# Patient Record
Sex: Female | Born: 1960 | Hispanic: Yes | Marital: Married | Smoking: Never smoker
Health system: Southern US, Community
[De-identification: ages and names within clinical notes are randomized; demographics above are authoritative.]

## PROBLEM LIST (undated history)

## (undated) DIAGNOSIS — E785 Hyperlipidemia, unspecified: Secondary | ICD-10-CM

## (undated) DIAGNOSIS — E559 Vitamin D deficiency, unspecified: Secondary | ICD-10-CM

## (undated) DIAGNOSIS — I10 Essential (primary) hypertension: Secondary | ICD-10-CM

## (undated) DIAGNOSIS — K219 Gastro-esophageal reflux disease without esophagitis: Secondary | ICD-10-CM

## (undated) HISTORY — PX: BREAST BIOPSY: SHX20

## (undated) HISTORY — PX: CHOLECYSTECTOMY: SHX55

---

## 2003-12-11 ENCOUNTER — Encounter: Admission: RE | Admit: 2003-12-11 | Discharge: 2003-12-11 | Payer: Self-pay | Admitting: Sports Medicine

## 2004-01-14 ENCOUNTER — Encounter: Payer: Self-pay | Admitting: Family Medicine

## 2004-01-14 ENCOUNTER — Encounter: Admission: RE | Admit: 2004-01-14 | Discharge: 2004-01-14 | Payer: Self-pay | Admitting: Sports Medicine

## 2009-03-13 ENCOUNTER — Ambulatory Visit: Payer: Self-pay | Admitting: Family Medicine

## 2009-03-13 ENCOUNTER — Encounter: Payer: Self-pay | Admitting: Family Medicine

## 2009-03-13 DIAGNOSIS — N912 Amenorrhea, unspecified: Secondary | ICD-10-CM

## 2009-03-18 ENCOUNTER — Ambulatory Visit: Payer: Self-pay | Admitting: Family Medicine

## 2009-03-18 ENCOUNTER — Encounter: Payer: Self-pay | Admitting: Family Medicine

## 2009-03-18 LAB — CONVERTED CEMR LAB
HDL: 44 mg/dL (ref >39)
LDL Cholesterol: 128 mg/dL — ABNORMAL HIGH (ref 0–99)
Total CHOL/HDL Ratio: 4.3
Triglycerides: 84 mg/dL (ref <150)
VLDL: 17 mg/dL (ref 0–40)

## 2009-03-21 ENCOUNTER — Encounter: Payer: Self-pay | Admitting: Family Medicine

## 2013-06-20 ENCOUNTER — Other Ambulatory Visit (HOSPITAL_COMMUNITY)
Admission: RE | Admit: 2013-06-20 | Discharge: 2013-06-20 | Disposition: A | Discharge status: Home / Self Care | Payer: Commercial Indemnity | Source: Ambulatory Visit | Attending: Obstetrics & Gynecology | Admitting: Obstetrics & Gynecology

## 2013-06-20 ENCOUNTER — Other Ambulatory Visit: Payer: Self-pay

## 2013-06-20 ENCOUNTER — Other Ambulatory Visit: Payer: Self-pay | Admitting: Obstetrics & Gynecology

## 2013-06-20 DIAGNOSIS — Z01419 Encounter for gynecological examination (general) (routine) without abnormal findings: Secondary | ICD-10-CM | POA: Insufficient documentation

## 2013-06-20 DIAGNOSIS — Z1231 Encounter for screening mammogram for malignant neoplasm of breast: Secondary | ICD-10-CM

## 2013-06-20 DIAGNOSIS — Z1151 Encounter for screening for human papillomavirus (HPV): Secondary | ICD-10-CM | POA: Insufficient documentation

## 2013-07-25 ENCOUNTER — Inpatient Hospital Stay: Admission: RE | Admit: 2013-07-25 | Payer: Self-pay | Source: Ambulatory Visit

## 2013-07-30 ENCOUNTER — Ambulatory Visit
Admission: RE | Admit: 2013-07-30 | Discharge: 2013-07-30 | Disposition: A | Discharge status: Home / Self Care | Payer: Managed Care, Other (non HMO) | Source: Ambulatory Visit

## 2013-07-30 DIAGNOSIS — Z1231 Encounter for screening mammogram for malignant neoplasm of breast: Secondary | ICD-10-CM

## 2013-07-30 IMAGING — MG MM SCREEN MAMMOGRAM BILATERAL
4 series · 4 of 4 positions shown · non-contrast
Comparison: None.

CLINICAL DATA: Screening.

EXAM:
DIGITAL SCREENING BILATERAL MAMMOGRAM WITH CAD

[R CC]
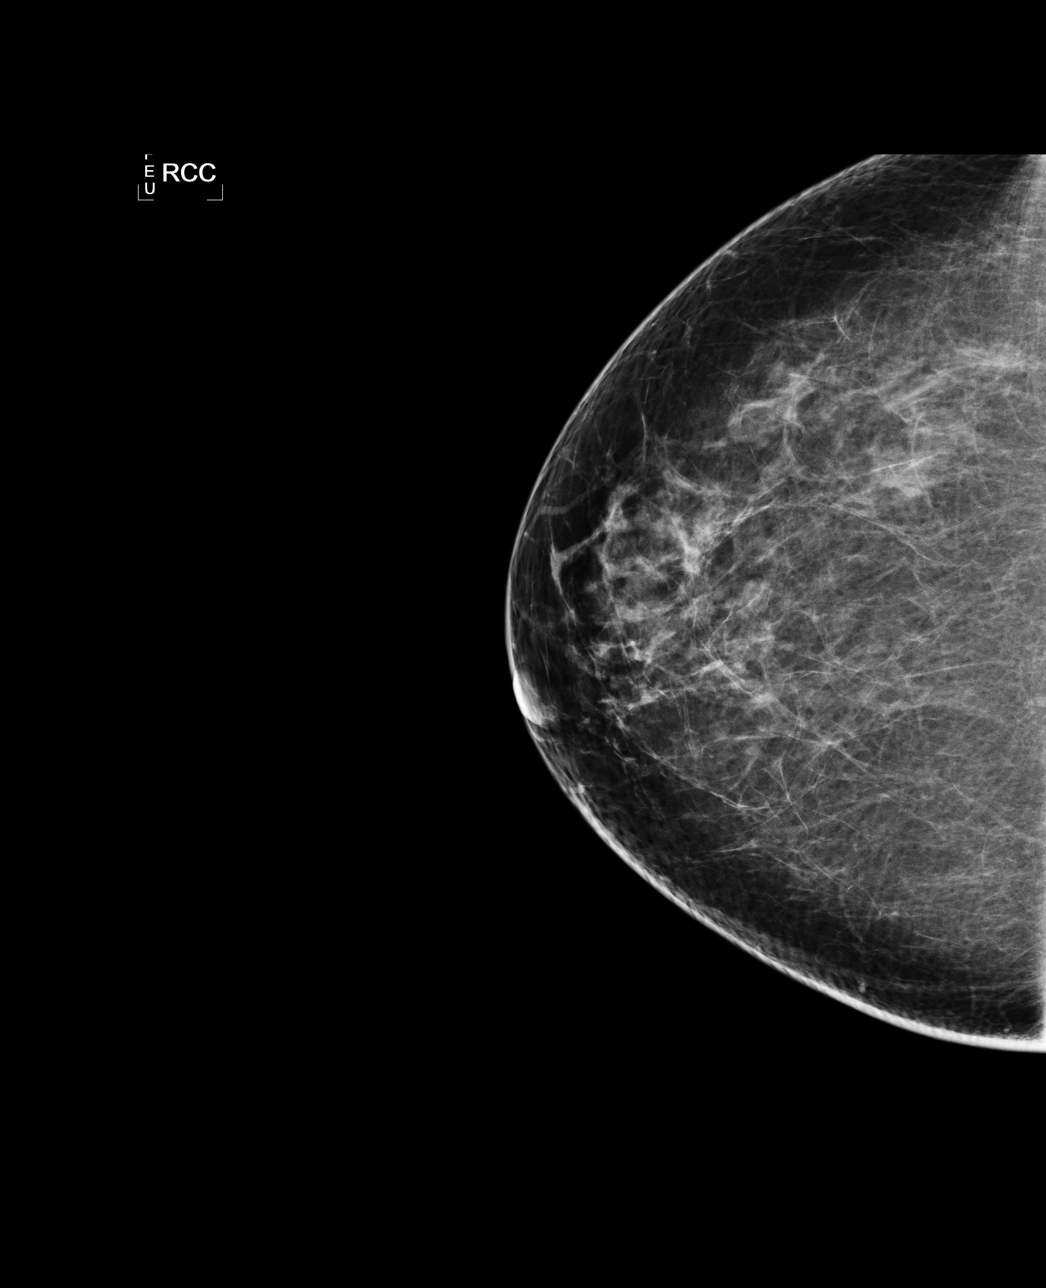

[L CC]
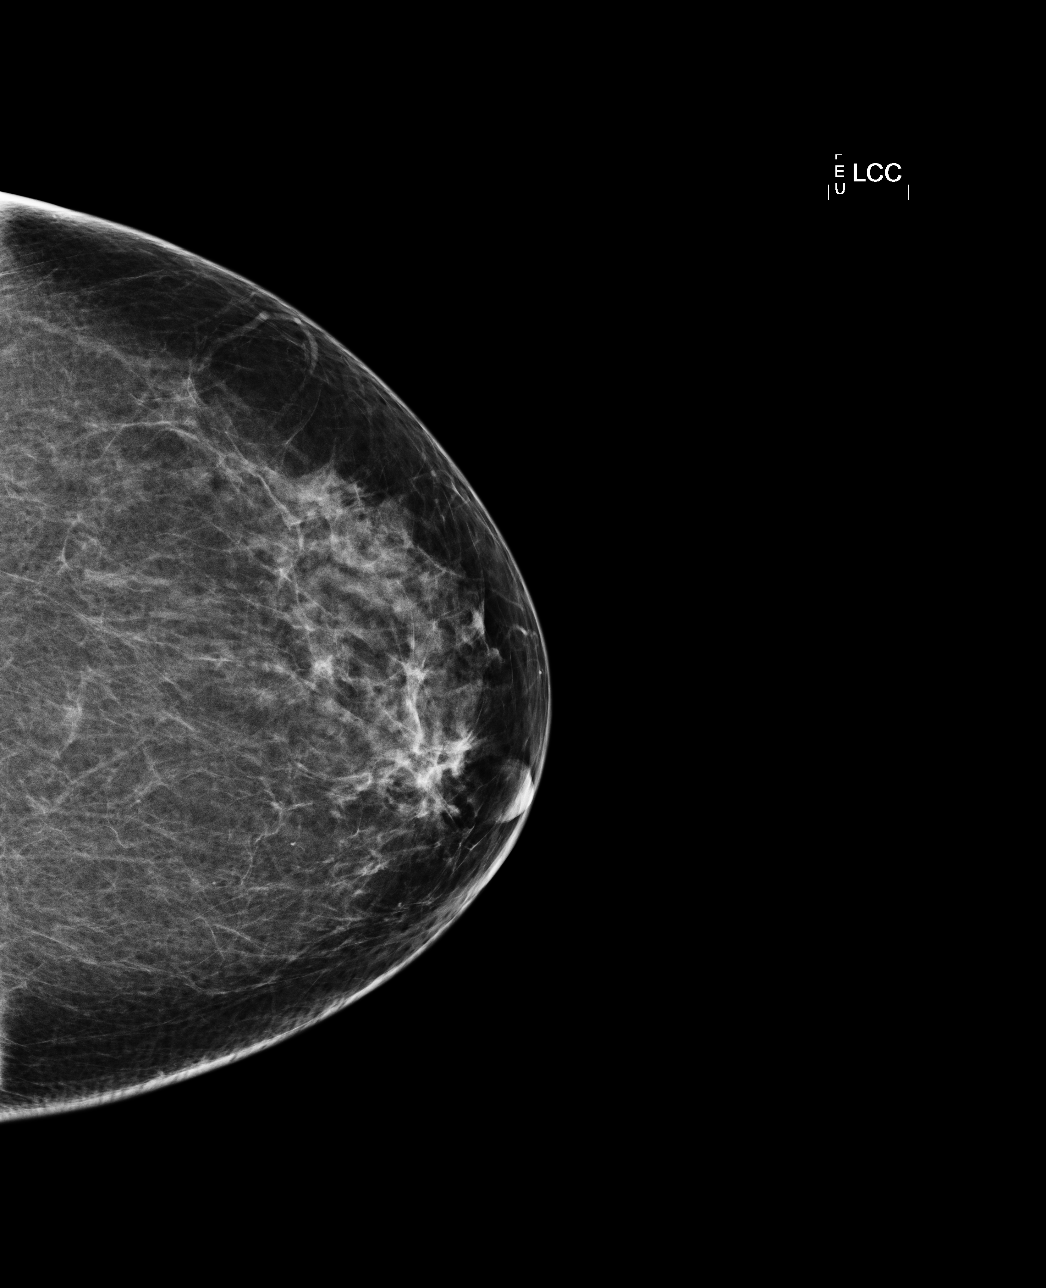

[L MLO]
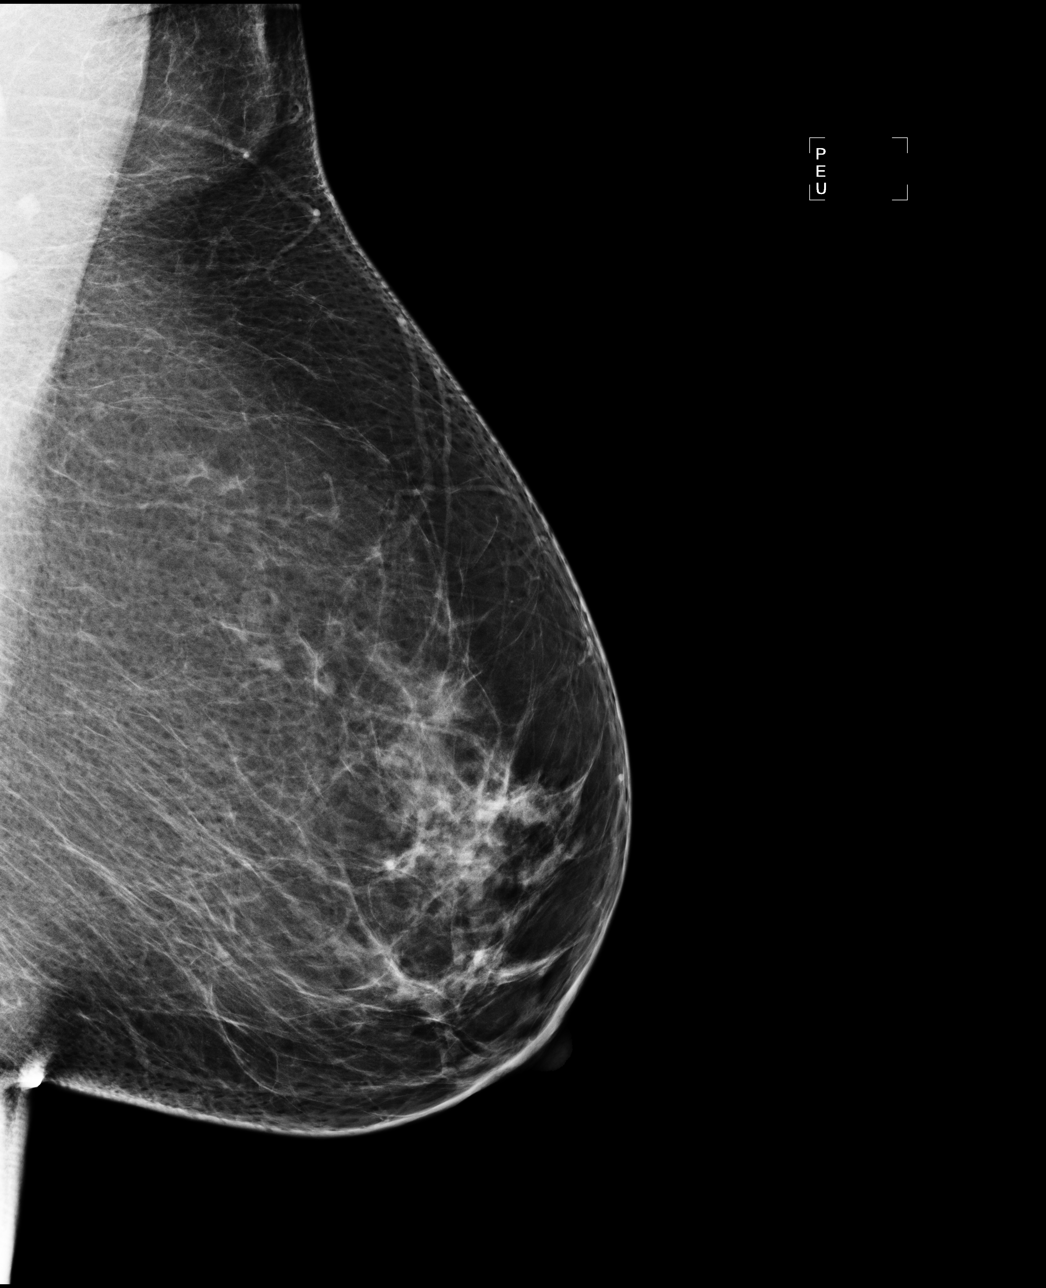

[R MLO]
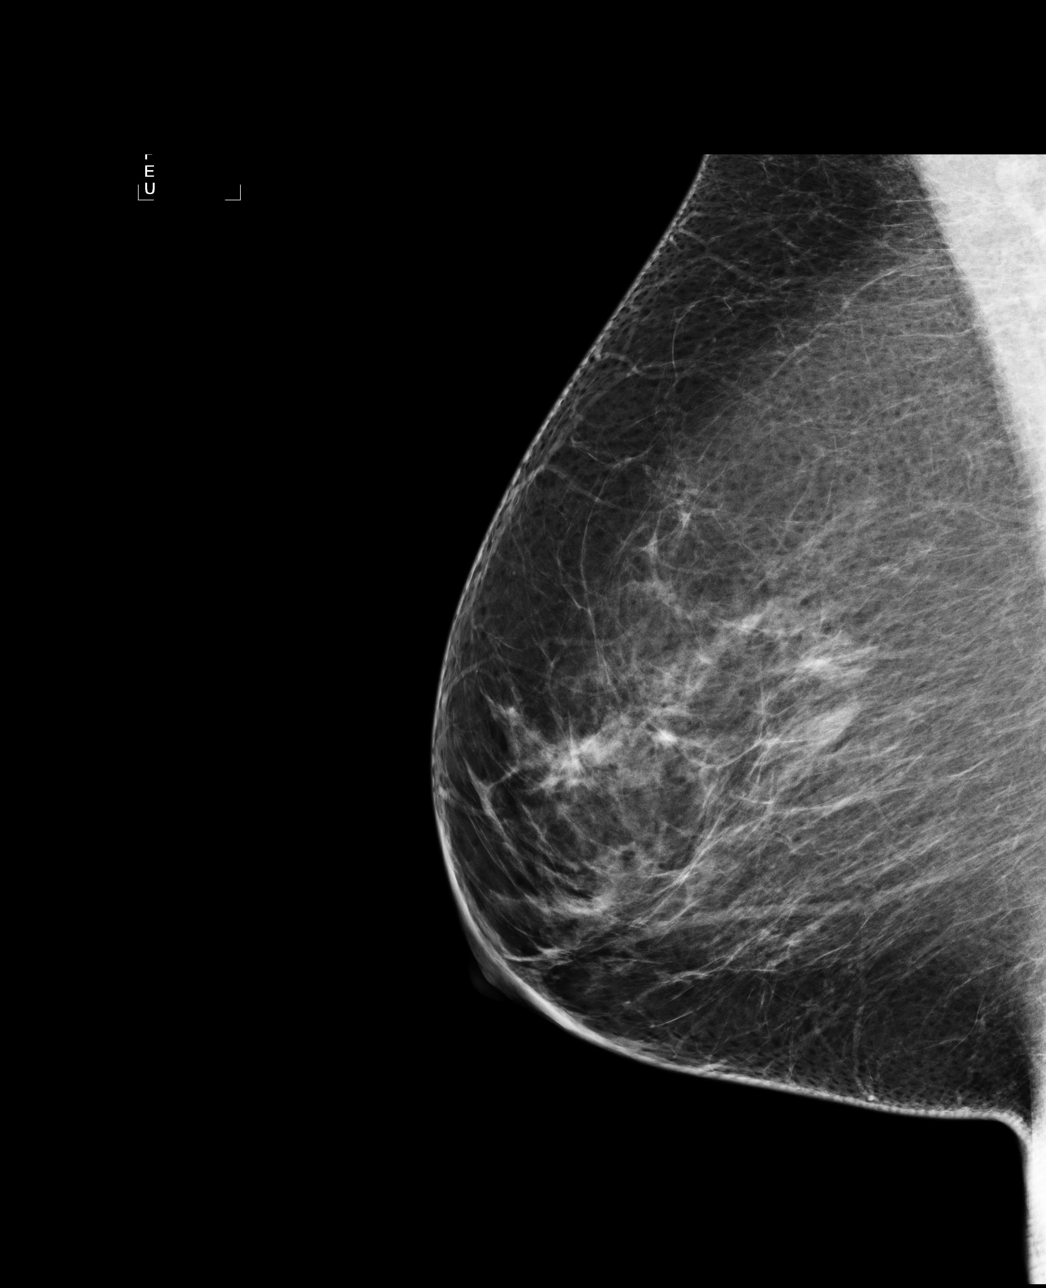

[4 of 4 positions shown; findings below may reference images not displayed]

ACR Breast Density Category b: There are scattered areas of
fibroglandular density.
FINDINGS: There are no findings suspicious for malignancy. Images were
processed with CAD.
IMPRESSION: No mammographic evidence of malignancy. A result letter of this
screening mammogram will be mailed directly to the patient.

RECOMMENDATION:
Screening mammogram in one year. (Code:[PT])

BI-RADS CATEGORY  1: Negative

## 2014-02-15 ENCOUNTER — Emergency Department (INDEPENDENT_AMBULATORY_CARE_PROVIDER_SITE_OTHER): Payer: Managed Care, Other (non HMO)

## 2014-02-15 ENCOUNTER — Emergency Department (INDEPENDENT_AMBULATORY_CARE_PROVIDER_SITE_OTHER)
Admission: EM | Admit: 2014-02-15 | Discharge: 2014-02-15 | Disposition: A | Discharge status: Home / Self Care | Payer: Managed Care, Other (non HMO) | Source: Home / Self Care | Attending: Family Medicine | Admitting: Family Medicine

## 2014-02-15 ENCOUNTER — Encounter (HOSPITAL_COMMUNITY): Payer: Self-pay | Admitting: Emergency Medicine

## 2014-02-15 DIAGNOSIS — S93529A Sprain of metatarsophalangeal joint of unspecified toe(s), initial encounter: Secondary | ICD-10-CM

## 2014-02-15 DIAGNOSIS — W010XXA Fall on same level from slipping, tripping and stumbling without subsequent striking against object, initial encounter: Secondary | ICD-10-CM

## 2014-02-15 IMAGING — CR DG FOOT COMPLETE 3+V*L*
3 series · 3 of 3 positions shown · non-contrast
Comparison: None.

CLINICAL DATA: Tripping injury with pain in left foot.

EXAM:
LEFT FOOT - COMPLETE 3+ VIEW

[view not recorded (1 of 3)]
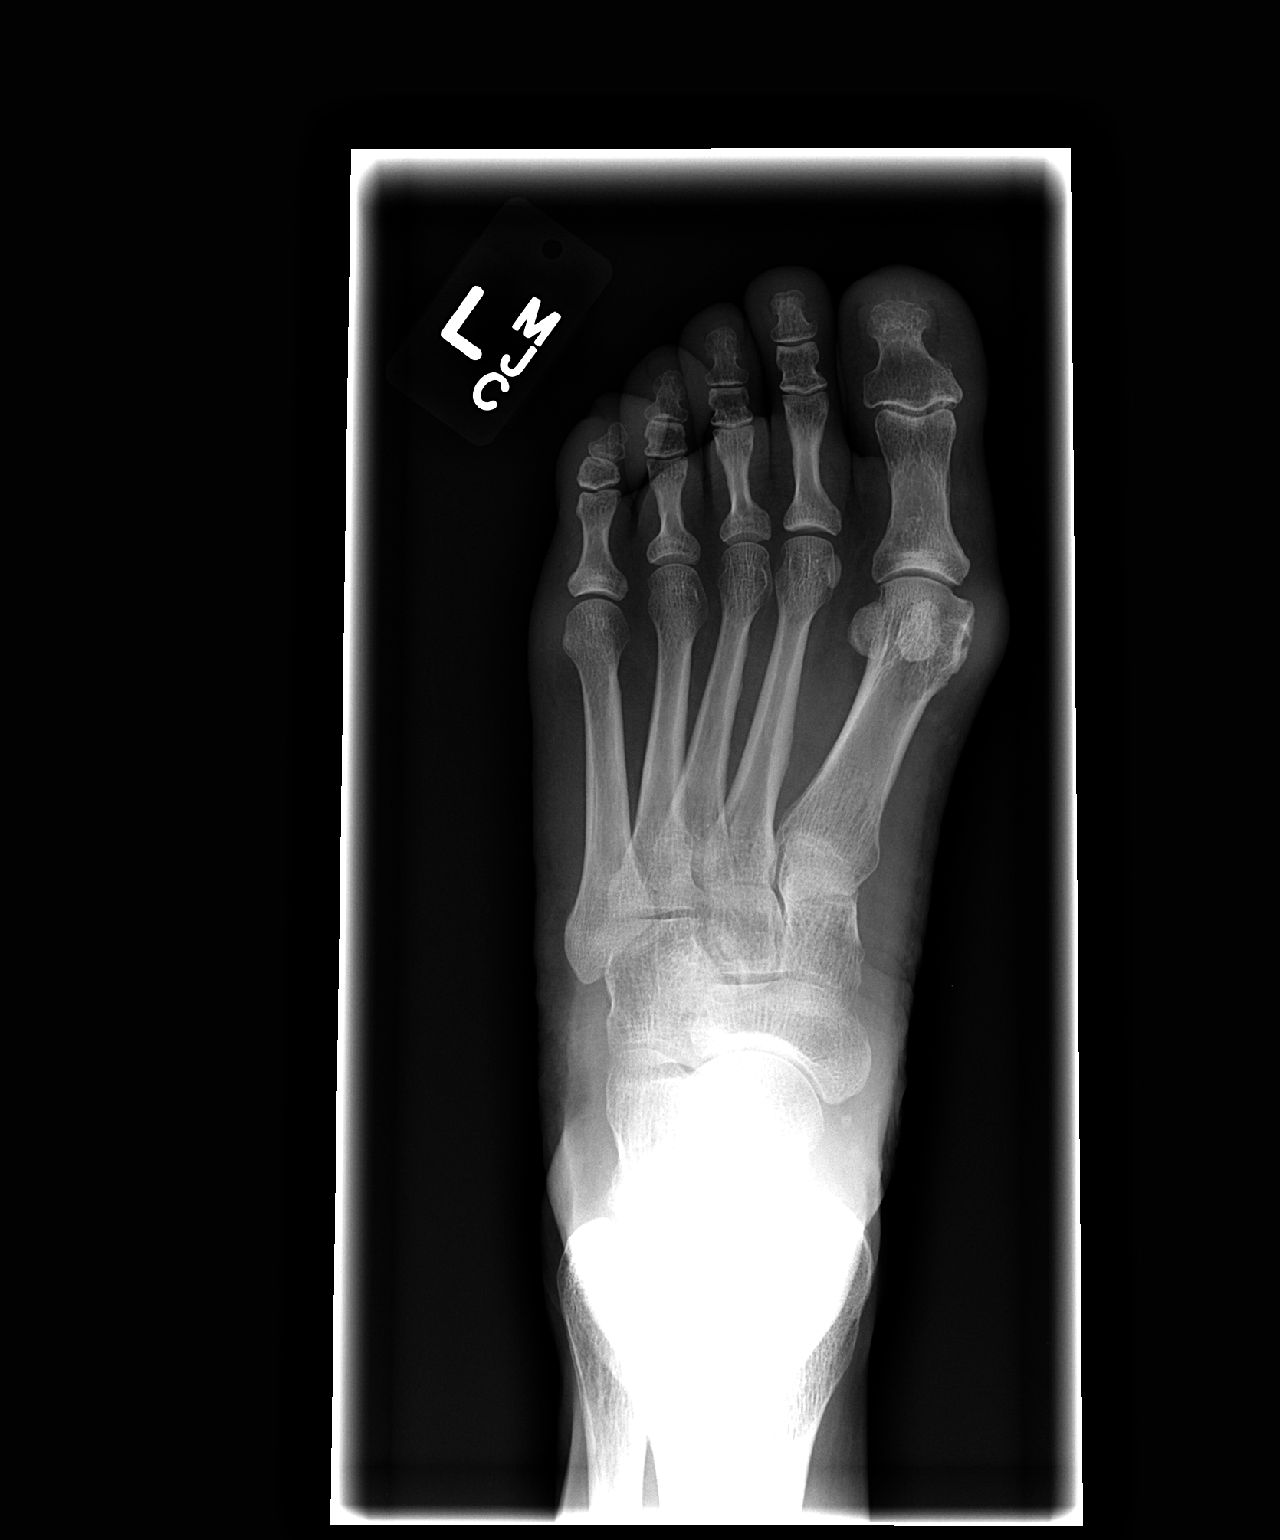

[view not recorded (2 of 3)]
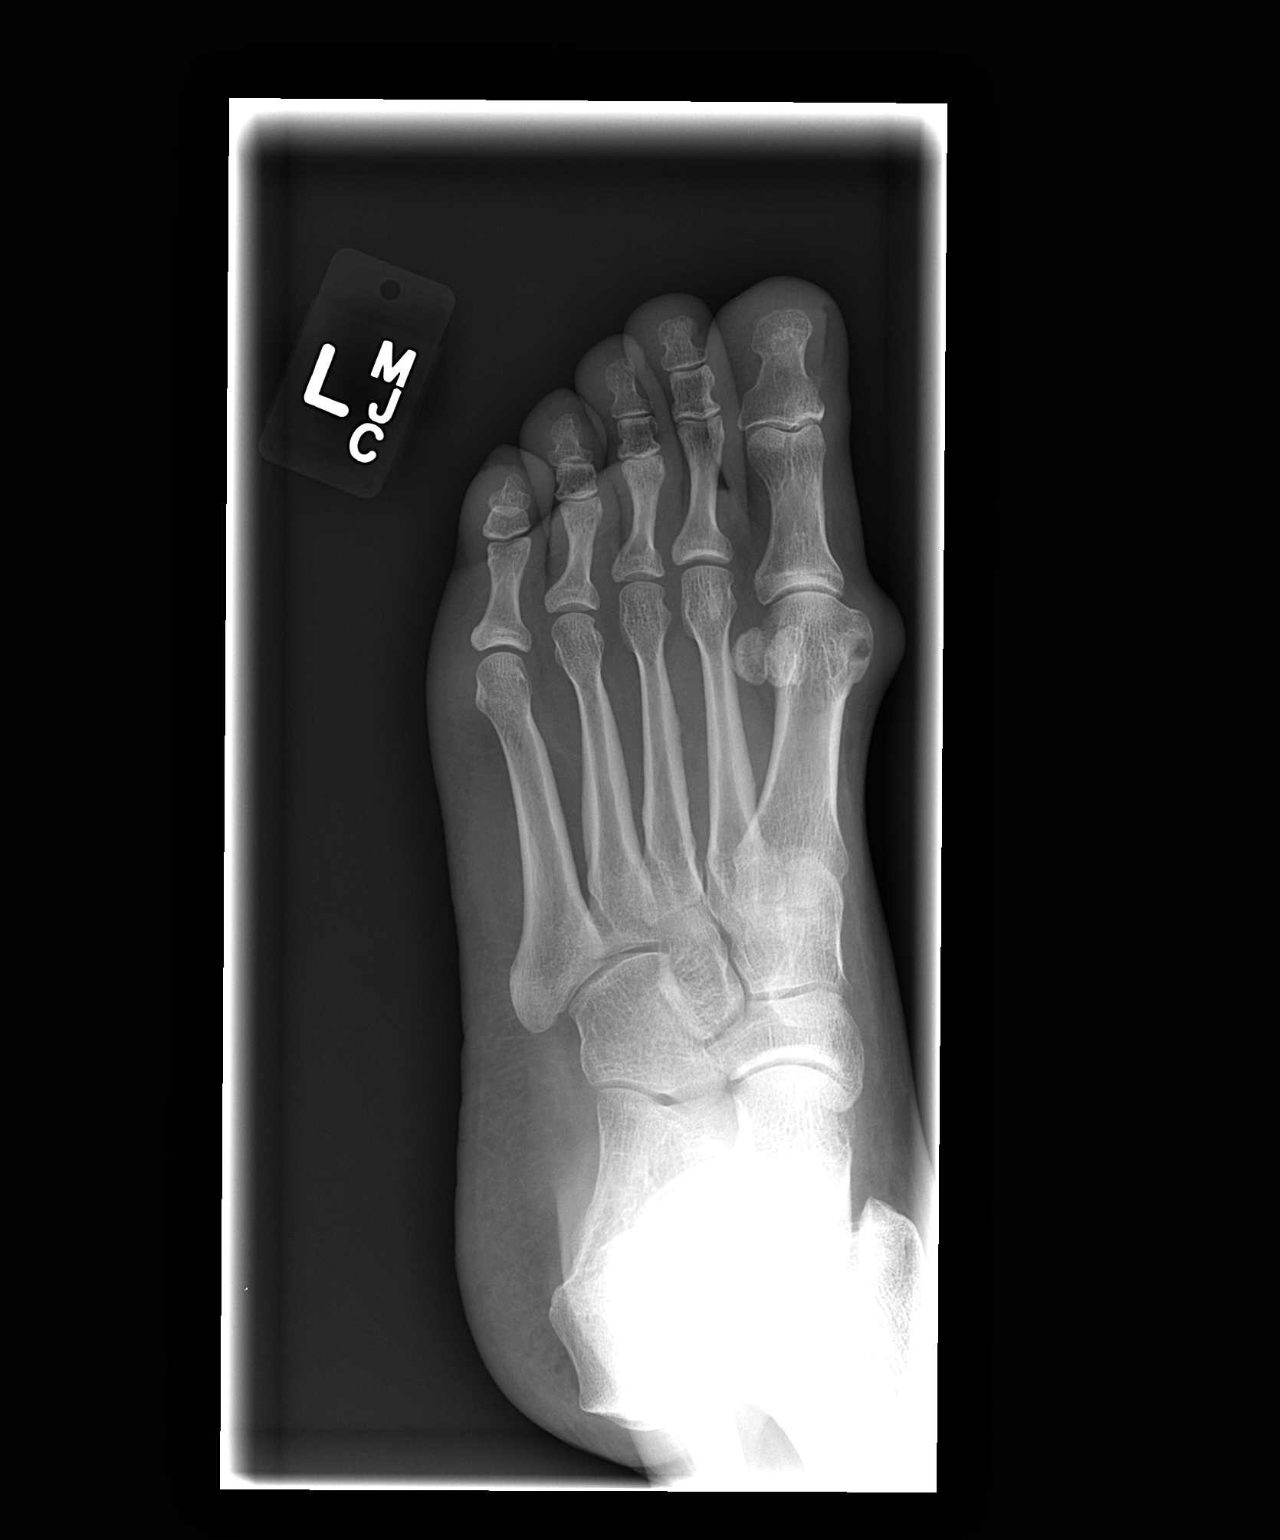

[view not recorded (3 of 3)]
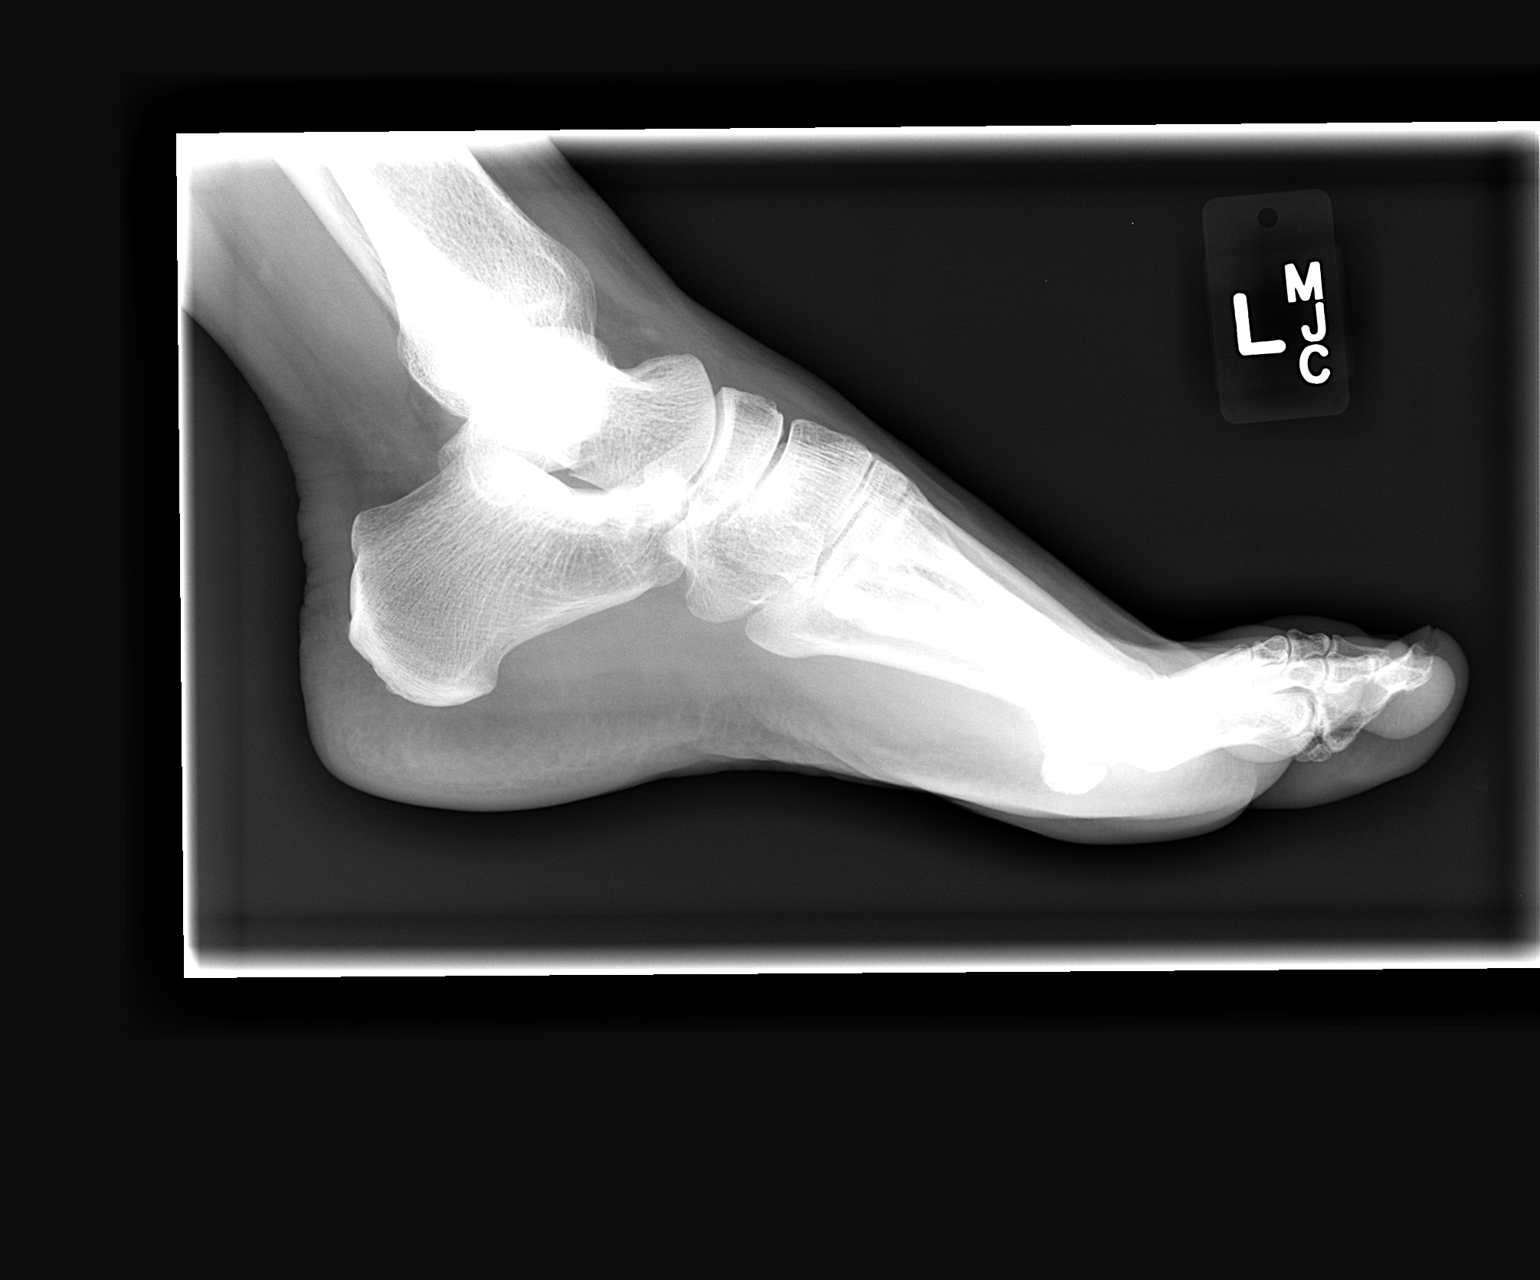

[3 of 3 positions shown; findings below may reference images not displayed]

FINDINGS: Mild hallux valgus. Bunion deformity along the medial head of the
first metatarsal. No acute osseous or joint abnormality.
IMPRESSION: No acute osseous or joint abnormality.

## 2014-02-15 MED ORDER — TRAMADOL HCL 50 MG PO TABS: 50.0000 mg | ORAL_TABLET | Freq: Four times a day (QID) | ORAL | Status: DC | PRN

## 2014-02-15 MED ORDER — HYDROCODONE-ACETAMINOPHEN 5-325 MG PO TABS
1.0000 | ORAL_TABLET | Freq: Once | ORAL | Status: AC
  Administered 2014-02-15: 1 via ORAL

## 2014-02-15 MED ORDER — HYDROCODONE-ACETAMINOPHEN 5-325 MG PO TABS
ORAL_TABLET | ORAL | Status: AC
  Filled 2014-02-15: qty 1

## 2014-02-15 NOTE — ED Provider Notes (Signed)
Patricia Stevenson is a 53 y.o. female who presents to Urgent Care today for left great toe pain. Patient tripped and fell today when in her great toe. She notes significant pain at the great toe. She denies any radiating pain weakness or numbness. The pain is predominantly at the interphalangeal joint. She feels well otherwise. No medications tried yet. Injury occurred just prior to presentation.   History reviewed. No pertinent past medical history. History  Substance Use Topics  . Smoking status: Never Smoker   . Smokeless tobacco: Not on file  . Alcohol Use: No   ROS as above Medications: No current facility-administered medications for this encounter.   Current Outpatient Prescriptions  Medication Sig Dispense Refill  . traMADol (ULTRAM) 50 MG tablet Take 1 tablet (50 mg total) by mouth every 6 (six) hours as needed.  15 tablet  0    Exam:  BP 135/75  Pulse 68  Temp(Src) 98.9 F (37.2 C) (Oral)  Resp 14  SpO2 100% Gen: Well NAD Left great toe: Normal-appearing no ecchymosis or swelling. Tender to palpation across the MTP and interphalangeal joint. Capillary refill sensation pulses are intact distally.  No results found for this or any previous visit (from the past 24 hour(s)). Dg Foot Complete Left  02/15/2014   CLINICAL DATA:  Tripping injury with pain in left foot.  EXAM: LEFT FOOT - COMPLETE 3+ VIEW  COMPARISON:  None.  FINDINGS: Mild hallux valgus. Bunion deformity along the medial head of the first metatarsal. No acute osseous or joint abnormality.  IMPRESSION: No acute osseous or joint abnormality.   Electronically Signed   By: Lorin Picket M.D.   On: 02/15/2014 20:41    Assessment and Plan: 53 y.o. female with toe injury. Concern for toe. Followup with sports medicine. Postoperative shoe and tramadol for pain  Discussed warning signs or symptoms. Please see discharge instructions. Patient expresses understanding.    Gregor Hams, MD 02/15/14 2052

## 2014-02-15 NOTE — Discharge Instructions (Signed)
Thank you for coming in today. Use the shoe as needed.  Use tramadol for severe pain.  Follow up with Dr. Tamala Julian.   Hiperextensin del dedo gordo con rehabilitacin (Turf Toe with Rehab) Ardelia Mems hiperextensin en la base del dedo gordo (primera articulacin falngica metatarsiana) causa un dao en la cpsula de la articulacin y en los ligamentos. Generalmente ocurre en la zona inferior de Water engineer. SNTOMAS  Dolor, sensibilidad, inflamacin o hematomas alrededor del dedo gordo (contusin).  Dolor que empeora con los movimientos del dedo gordo, especficamente al levantar (extender) el dedo.  Imposibilidad de caminar bien con el pie afectado, lo que causa renguera. CAUSAS Una fuerza que se ejerce sobre la cpsula de la articulacin y los ligamentos, mayor que la que puede soportar el dedo. Los mecanismos ms frecuentes de una lesin son:  Extensin repetitiva o enrgica del dedo del pie (p.ej. al pararse en puntas de pie).  Iniciar una carrera de manera sbita (velocistas).  Dedo gordo "regordete".  Otro jugador cae [employer] LOS RIESGOS AUMENTAN CON:  Lesiones previas en el dedo.  Tener un dedo gordo [employer].  Pie plano.  Artritis del dedo Personnel officer del pie.  Calzado que no ajusta bien o zapatos que no son adecuados para [place].  Antecedentes familiares de anormalidades del pie.  Actividades que implican iniciar carreras de manera sbita, pararse en puntas de pie o saltar. PREVENCIN  Use calzado que le ajuste bien y que sea adecuado para el deporte o la Environmental education officer.  Proteja el dedo gordo con un vendaje para Transport planner.  Mantener la forma fsica:  Kerry Hough, flexibilidad y resistencia muscular.  Capacidad cardiovascular. PRONSTICO Si se trata adecuadamente, los sntomas se resuelven con tratamiento no quirrgico (conservador). En ocasiones requiere someterse a [place]. COMPLICACIONES RELACIONADAS  La recurrencia  de los sntomas puede dar como resultado un problema crnico.  Imposibilidad de competir en deportes.  Tiempo de curacin prolongado si no se trata adecuadamente o vuelve a lesionarse.  Otras lesiones en el pie que se producen por proteger al dedo gordo del dolor.  Disminucin del movimiento del dedo (hallux rigidus).  Juanete (hallux valgus). [place] en descansar de actividades que lo agravan y el uso de medicamentos y la aplicacin de hielo para Best boy y reducir la hinchazn. Los ejercicios de amplitud de movimientos pueden ayudar a Dietitian con la [place]. Es importante que use calzado que le ajuste bien con suela rgida y amplios, para reducir la presin Psychologist, clinical dedo. Proteja el dedo gordo con tela adhesiva para restringir los movimientos y as Energy manager a la prctica de deportes sin dolores ni molestias. Si la afeccin se vuelve crnica, el mdico le indicar una inyeccin de corticoides para reducir la inflamacin. Si los sntomas persisten a pesar del tratamiento no quirrgico, podrn indicarle [place]. MEDICAMENTOS   Si necesita analgsicos, se recomiendan los antiinflamatorios no esteroides, como aspirina e ibuprofeno y otros calmantes menores, como acetaminofeno  No tome medicamentos para Conservation officer, historic buildings dentro de los 7 das previos a la [place].  Los analgsicos prescriptos se indicarn si el mdico lo considera necesario. Utilcelos como se le indique y slo cuando lo necesite.  Los ungentos pueden ser beneficiosos.  En algunos casos se indica una inyeccin de corticosteroides. Estas inyecciones deben reservarse para los casos graves, porque slo se pueden administrar una determinada cantidad de veces. CALOR Y FRO  El tratamiento con fro [place]  dolor y reduce la inflamacin. El fro debe aplicarse durante 10 a 15 minutos cada 2  3 horas para reducir la inflamacin y Conservation officer, historic buildings e inmediatamente despus de cualquier  actividad que agrava los sntomas. Utilice hielo o aplique masajes en la zona con un trozo de hielo.  El calor puede usarse antes de Neurosurgeon y [place]um fortalecimiento indicadas por el profesional, le fisioterapeuta o Industrial/product designer. Utilice una bolsa trmica o un pao hmedo. SOLICITE ATENCIN MDICA SI:  El tratamiento no Civil engineer, contracting buenos resultados o el problema [place].  Algn medicamento le produce [employer]. EJERCICIOS EJERCICIOS DE AMPLITUD DE MOVIMIENTOS Y ELONGACIN Hiperextensin del dedo gordo Estos ejercicios le ayudarn en la recuperacin de la lesin. Los sntomas podrn aliviarse con o sin asistencia adicional de su mdico, fisioterapeuta o Administrator, sports. Al completar estos ejercicios, recuerde:   Restaurar la flexibilidad del tejido ayuda a que las articulaciones recuperen el movimiento normal. Esto permite que el movimiento y la actividad sea ms saludables y menos dolorosos.  Para que sea efectiva, cada elongacin debe realizarse durante al menos 30 segundos.  La elongacin nunca debe ser dolorosa. Deber sentir slo un alargamiento suave o cesin del tejido que estira. AMPLITUD DE MOVIMIENTOS - Extensin de los dedos - flexin  Sintese con la pierna derecha / izquierdo cruzada sobre la rodilla opuesta.  [employer] dedos y empjelos suavemente hacia atrs, hacia la zona superior del pie. Debe sentir un estiramiento suave en la zona inferior de los dedos y del pie.  Mantenga esta posicin durante __________ segundos.  Ahora empuje suavemente los dedos [employer] parte inferior del pie. Debe sentir un estiramiento suave en la zona superior de los dedos y del pie.  Mantenga esta posicin durante __________ segundos. Reptalo __________ veces. Realice este estiramiento __________ Vicenta Aly por da.  AMPLITUD DE MOVIMIENTOS - Flexin plantar del tobillo  Sintese con la pierna derecha / izquierdo cruzada sobre la rodilla opuesta.  Use la mano opuesta para empujar la  punta del pie y los dedos [place] usted.  Debe sentir un estiramiento suave en la punta del pie y [place]. Mantenga esta posicin durante __________ segundos. Reptalo __________ veces. Realice este ejercicio __________ veces por da.  EJERCICIOS DE FORTALECIMIENTO - Hiperextensin del dedo gordo del pie Estos ejercicios le ayudarn en la recuperacin de la lesin. Los sntomas podrn desaparecer con o sin mayor intervencin del profesional, el fisioterapeuta o Industrial/product designer. Al completar estos ejercicios, recuerde:   Los msculos pueden ganar tanto la resistencia como la fuerza que necesita para sus actividades diarias a travs de ejercicios controlados.  Realice los ejercicios como se lo indic el mdico, el fisioterapeuta o Industrial/product designer. [place] slo con los ejercicios de resistencia y haga las repeticiones que su mdico le indique.  Podr experimentar dolor o cansancio muscular, pero el dolor o molestia que trata de eliminar a travs de los ejercicios nunca debe empeorar. Si el dolor empeora, detngase y asegrese de que est siguiendo las directivas correctamente. Si an siente dolor luego de Optometrist lo ajustes necesarios, deber discontinuar el ejercicio hasta que pueda conversar con el profesional sobre el problema. [place] de toalla  Sintese en una silla en una superficie no alfombrada.  Coloque el pie en Rozann Lesches, y mantenga el taln en el suelo.  Coloque la toalla alrededor del taln pero envuelva slo los dedos del pie. Mantenga el taln contra el piso.  Aada ____________________ Kenn File al extremo de la toalla si el  mdico, fisioterapeuta o entrenador se lo indican. Reptalo __________ veces. Realice este ejercicio __________ veces por da. Document Released: 01/26/2008 Document Revised: 11/01/2011 [facility] Patient Information 2015 [place]. This information is not intended to replace advice given to you by your health care provider. Make sure you discuss any  questions you have with your health care provider.

## 2014-02-15 NOTE — ED Notes (Signed)
Dr. Georgina Snell is in the pt w/room Pt c/o left foot inj onset 1830 Fell and tripped over baby gate Swelling and pain w/activity Alert w/no signs of acute distress.

## 2014-10-05 ENCOUNTER — Encounter (HOSPITAL_COMMUNITY): Payer: Self-pay

## 2014-10-05 ENCOUNTER — Emergency Department (HOSPITAL_COMMUNITY)
Admission: EM | Admit: 2014-10-05 | Discharge: 2014-10-05 | Disposition: A | Discharge status: Home / Self Care | Payer: Managed Care, Other (non HMO) | Attending: Emergency Medicine | Admitting: Emergency Medicine

## 2014-10-05 DIAGNOSIS — K088 Other specified disorders of teeth and supporting structures: Secondary | ICD-10-CM | POA: Insufficient documentation

## 2014-10-05 DIAGNOSIS — K0889 Other specified disorders of teeth and supporting structures: Secondary | ICD-10-CM

## 2014-10-05 DIAGNOSIS — K029 Dental caries, unspecified: Secondary | ICD-10-CM | POA: Insufficient documentation

## 2014-10-05 HISTORY — DX: Essential (primary) hypertension: I10

## 2014-10-05 MED ORDER — PENICILLIN V POTASSIUM 250 MG PO TABS: 250.0000 mg | ORAL_TABLET | Freq: Four times a day (QID) | ORAL | Status: AC

## 2014-10-05 MED ORDER — TRAMADOL HCL 50 MG PO TABS: 50.0000 mg | ORAL_TABLET | Freq: Four times a day (QID) | ORAL | Status: DC | PRN

## 2014-10-05 NOTE — ED Provider Notes (Signed)
CSN: [id number]     Arrival date & time 10/05/14  1017 History   First MD Initiated Contact with Patient 10/05/14 1018     Chief Complaint  Patient presents with  . Dental Pain     (Consider location/radiation/quality/duration/timing/severity/associated sxs/prior Treatment) HPI Comments: Pt c/o right lower dental pain times 5 days. Denies fever. States that she has an appointment with the dentist in 3 days but the pain has worsened. She has been taking tylenol and motrin without relief. States that she has had some swelling to area. Denies problems swallowing or breathing  The history is provided by the patient. No language interpreter was used.    No past medical history on file. No past surgical history on file. No family history on file. History  Substance Use Topics  . Smoking status: Never Smoker   . Smokeless tobacco: Not on file  . Alcohol Use: No   OB History    No data available     Review of Systems  All other systems reviewed and are negative.     Allergies  Review of patient's allergies indicates no known allergies.  Home Medications   Prior to Admission medications   Medication Sig Start Date End Date Taking? Authorizing Provider  traMADol (ULTRAM) 50 MG tablet Take 1 tablet (50 mg total) by mouth every 6 (six) hours as needed. 02/15/14   Gregor Hams, MD   There were no vitals taken for this visit. Physical Exam  Constitutional: She appears well-developed and well-nourished.  HENT:  Right Ear: External ear normal.  Left Ear: External ear normal.  Mouth/Throat:    Decay noted. And redness to the gum. Mild swelling to the area   Cardiovascular: Normal rate.   Pulmonary/Chest: Effort normal and breath sounds normal.  Musculoskeletal: Normal range of motion.  Nursing note and vitals reviewed.   ED Course  Procedures (including critical care time) Labs Review Labs Reviewed - No data to display  Imaging Review No results found.   EKG  Interpretation None      MDM   Final diagnoses:  Toothache    Will treat with pcn and tramadol. No sign of ludwigs angina, pt has dental follow up    Glendell Docker, NP 10/05/14 1039  Dorie Rank, MD 10/05/14 1626

## 2014-10-05 NOTE — Discharge Instructions (Signed)
Dolor dental (Dental Pain) Usted ha consultado con el profesional que lo asiste porque sufre dolor en un diente. ste puede estar ocasionado en caries dentales, las que exponen el nervio del diente al aire, y a temperaturas fras o calientes, lo que Air cabin crew. Puede provenir de una infeccin o absceso (tambin denominado fornculo) alrededor del diente, que tambin suele ser causado por caries dentales. Esto produce el dolor que usted siente. DIAGNSTICO El profesional que lo asiste puede diagnosticar el problema con un examen bucal. TRATAMIENTO  Si la causa es una infeccin, puede tratarse con antibiticos (medicamentos que combaten grmenes) y Chief Strategy Officer que Scientist, research (medical), como le ha recetado el profesional que lo asiste. Tome la medicacin como se le indic.  Utilice los medicamentos de venta libre o de prescripcin para Conservation officer, historic buildings, Health and safety inspector o la [place], segn se lo indique el profesional que lo asiste.  Debido a que Scientist, research (medical) generalmente es causado por infeccin o por una enfermedad dental, es aconsejable que acuda a su dentista lo antes posible para que le realice un tratamiento ms profundo. SOLICITE ATENCIN MDICA DE INMEDIATO SI: El examen y el tratamiento que recibi hoy fue indicado slo para hacer frente a la Doctor, general practice. No constituye un sustituto de la atencin mdica o Hospital doctor. Si su problema empeora o surgen nuevos sntomas (problemas) y no puede concertar una cita con su odontlogo para un pronto seguimiento, llame o acuda nuevamente a Dietitian. SOLICITE ATENCIN MDICA DE INMEDIATO SI:  Tiene fiebre.  Presenta enrojecimiento e hinchazn en el rostro, la mandbula o el cuello.  No puede abrir Equities trader.  Siente un dolor intenso que no puede ser Microsoft. EST SEGURO QUE:   Comprende las instrucciones para el alta mdica.  Controlar su enfermedad.  Solicitar atencin mdica de inmediato segn las indicaciones. Document  Released: 08/09/2005 Document Revised: 11/01/2011 [facility] Patient Information 2015 [place]. This information is not intended to replace advice given to you by your health care provider. Make sure you discuss any questions you have with your health care provider.

## 2014-10-05 NOTE — ED Notes (Signed)
Patient presents today with a chief complaint of right molar pain since Wednesday this week with worsening pain today. Patient has a dentist appointment next Monday but cannot bear the pain. She's been taking tylenol and using oral gel with minor relief.

## 2019-06-29 ENCOUNTER — Other Ambulatory Visit: Payer: Self-pay

## 2019-06-29 DIAGNOSIS — Z20822 Contact with and (suspected) exposure to covid-19: Secondary | ICD-10-CM

## 2019-06-30 LAB — NOVEL CORONAVIRUS, NAA: SARS-CoV-2, NAA: NOT DETECTED

## 2019-07-02 ENCOUNTER — Telehealth: Payer: Self-pay | Admitting: General Practice

## 2019-07-02 NOTE — Telephone Encounter (Signed)
Negative COVID results given. Patient results "NOT Detected." Caller expressed understanding. ° °

## 2020-01-29 ENCOUNTER — Ambulatory Visit: Payer: Managed Care, Other (non HMO) | Admitting: Internal Medicine

## 2020-04-29 ENCOUNTER — Ambulatory Visit: Payer: Managed Care, Other (non HMO) | Admitting: Internal Medicine

## 2020-05-20 ENCOUNTER — Other Ambulatory Visit: Payer: Self-pay

## 2020-05-21 ENCOUNTER — Encounter: Payer: Self-pay | Admitting: Internal Medicine

## 2020-05-21 ENCOUNTER — Ambulatory Visit (INDEPENDENT_AMBULATORY_CARE_PROVIDER_SITE_OTHER): Payer: Self-pay | Admitting: Internal Medicine

## 2020-05-21 VITALS — BP 140/80 | HR 71 | Temp 98.3°F | Ht 59.84 in | Wt 145.6 lb

## 2020-05-21 DIAGNOSIS — K219 Gastro-esophageal reflux disease without esophagitis: Secondary | ICD-10-CM

## 2020-05-21 DIAGNOSIS — R55 Syncope and collapse: Secondary | ICD-10-CM

## 2020-05-21 DIAGNOSIS — K921 Melena: Secondary | ICD-10-CM

## 2020-05-21 DIAGNOSIS — R030 Elevated blood-pressure reading, without diagnosis of hypertension: Secondary | ICD-10-CM

## 2020-05-21 MED ORDER — PANTOPRAZOLE SODIUM 40 MG PO TBEC: 40.0000 mg | DELAYED_RELEASE_TABLET | Freq: Every day | ORAL | 1 refills | Status: DC

## 2020-05-21 NOTE — Patient Instructions (Signed)
-Protonix 40 mg al dia en las maanas.  -Pruebas de sangre hoy, te aviso los [place].  -Haz cita para regresar en 3 meses.   Enfermedad de reflujo gastroesofgico en los adultos Gastroesophageal Reflux Disease, Adult El reflujo gastroesofgico (RGE) ocurre cuando el cido del estmago sube por el tubo que conecta la boca con el estmago (esfago). Normalmente, la comida baja por el esfago y se mantiene en el estmago, donde se la digiere. Cuando una persona tiene RGE, los alimentos y el cido estomacal suelen volver al esfago. Usted puede tener una enfermedad llamada enfermedad de reflujo gastroesofgico (ERGE) si el reflujo:  Sucede a menudo.  Causa sntomas frecuentes o muy intensos.  Causa problemas tales como dao en el esfago. Cuando esto ocurre, el esfago duele y se hincha (inflama). Con el tiempo, la ERGE puede ocasionar pequeos agujeros (lceras) en el revestimiento del esfago. Cules son las causas? Esta afeccin se debe a un problema en el msculo que se encuentra entre el esfago y [place]. Cuando este msculo est dbil o no es normal, no se cierra correctamente para impedir que los alimentos y el cido regresen del Paramedic. El msculo puede debilitarse debido a lo siguiente:  El consumo de [place].  [place].  Tener cierto tipo de hernia (hernia de hiato).  Consumo de alcohol.  Ciertos alimentos y bebidas, como caf, chocolate, cebollas y [place]. Qu incrementa el riesgo? Es ms probable que tenga esta afeccin si:  Tiene sobrepeso.  Tiene una enfermedad que afecta el tejido conjuntivo.  [place] antiinflamatorios no esteroideos (AINE). Cules son los signos o los sntomas? Los sntomas de esta afeccin incluyen:  Acidez estomacal.  Dificultad o dolor al tragar.  Sensacin de Best boy un bulto en la garganta.  Sabor amargo en la boca.  Mal aliento.  Tener una gran cantidad de saliva.  Estmago inflamado o con Tree surgeon.  Eructos.  Dolor en el  pecho. El dolor de pecho puede deberse a distintas afecciones. Asegrese de Teacher, adult education a su mdico si tiene Tourist information centre manager.  Falta de aire o respiracin ruidosa (sibilancias).  Tos constante (crnica) o durante la noche.  Desgaste de la superficie de los dientes (esmalte dental).  Prdida de peso. Cmo se trata? El tratamiento depender de la gravedad de los sntomas. El mdico puede sugerirle lo siguiente:  Cambios en la dieta.  Medicamentos.  Clementeen Hoof. Siga estas indicaciones en su casa: Comida y bebida   Siga una dieta como se lo haya indicado el mdico. Es posible que deba evitar alimentos y bebidas, por ejemplo: ? Caf y t (con o sin cafena). ? Bebidas que contengan alcohol. ? Bebidas energticas y deportivas. ? Bebidas gaseosas y refrescos. ? Chocolate y cacao. ? Menta y [place]. ? Ajo y cebolla. ? Rbano picante. ? Alimentos cidos y condimentados. Estos incluyen todos los tipos de pimientos, [place] en polvo, curry en polvo, vinagre, salsas picantes y [employer] Inc. ? Ctricos y sus jugos, por ejemplo, naranjas, limones y limas. ? Alimentos que [employer]. Estos incluyen salsa roja, [place], salsa picante y pizza con salsa de [place]. ? Alimentos fritos y Radio broadcast assistant. Estos incluyen donas, papas fritas, papitas fritas de bolsa y aderezos con alto contenido de [place]. ? Carnes con alto contenido de [place]. Estas incluye los perros calientes, chuletas o costillas, embutidos, jamn y tocino. ? Productos lcteos ricos en grasas, como leche [place], [place] y [place] crema.  Consuma pequeas cantidades de comida con ms frecuencia. Evite consumir porciones abundantes.  Evite beber  grandes cantidades de lquidos con las comidas.  Evite comer 2 o 3horas antes de acostarse.  Evite recostarse inmediatamente despus de comer.  No haga ejercicios enseguida despus de comer. Estilo de vida   No consuma ningn producto que contenga nicotina o tabaco. Estos  incluyen cigarrillos, cigarrillos electrnicos y tabaco para Higher education careers adviser. Si necesita ayuda para dejar de fumar, consulte al [employer].  Intente reducir [employer] de estrs. Si necesita ayuda para hacer esto, consulte al mdico.  Si tiene sobrepeso, baje una cantidad de peso saludable para usted. Consulte a su mdico para bajar de peso de [employer]. Indicaciones generales  Est atento a cualquier cambio en los sntomas.  Tome los medicamentos de venta libre y los recetados solamente como se lo haya indicado el mdico. No tome aspirina, ibuprofeno ni otros AINE a menos que el mdico lo autorice.  Use ropa holgada. No use nada apretado alrededor de la cintura.  Levante (eleve) la cabecera de la cama aproximadamente 6pulgadas (15cm).  Evite inclinarse si al hacerlo empeoran los sntomas.  Concurra a todas las visitas de seguimiento como se lo haya indicado el mdico. Esto es importante. Comunquese con un mdico si:  Aparecen nuevos sntomas.  Adelgaza y no sabe por qu.  Tiene problemas para tragar o le duele cuando traga.  Tiene sibilancias o tos persistente.  Los sntomas no mejoran con Dispensing optician.  Tiene la voz ronca. Solicite ayuda inmediatamente si:  [employer], el cuello, la [place], los dientes o la espalda.  Se siente transpirado, mareado o tiene una sensacin de desvanecimiento.  Siente falta de aire o Tourist information centre manager.  Vomita y el vmito tiene un aspecto similar a la sangre o a los posos de caf.  Pierde el conocimiento (se desmaya).  Las deposiciones (heces) son sanguinolentas o negras.  No puede tragar, beber o comer. Resumen  Si una persona tiene enfermedad de reflujo gastroesofgico (ERGE), los alimentos y el cido estomacal suben al esfago y causan sntomas o problemas tales como dao en el esfago.  El tratamiento depender de la gravedad de los sntomas.  Siga una Lexicographer se lo haya indicado el [place] todos los  medicamentos solamente como se lo haya indicado el mdico. Esta informacin no tiene Marine scientist el consejo del mdico. Asegrese de hacerle al mdico cualquier pregunta que tenga. Document Revised: 03/23/2018 Document Reviewed: 03/23/2018 Elsevier Patient Education  [place].

## 2020-05-21 NOTE — Progress Notes (Signed)
New Patient Office Visit     This visit occurred during the SARS-CoV-2 public health emergency.  Safety protocols were in place, including screening questions prior to the visit, additional usage of staff PPE, and extensive cleaning of exam room while observing appropriate contact time as indicated for disinfecting solutions.    CC/Reason for Visit: Establish care, discuss some acute concerns Previous PCP: None Last Visit: Unknown  HPI: Patricia Stevenson is a 59 y.o. female who is coming in today for the above mentioned reasons.  She has no past medical history of significance.  Throughout the years she has been told upon occasions her blood pressure has been high but she has never been formally diagnosed as hypertensive.  She comes in today to discuss a conglomeration of symptoms that have been present for over a year.  She has been very busy and stressed at work.  She sometimes forgets to eat until late at night.  She noticed increasing epigastric pain, with a burning in her upper chest, frequent belching.  Her family started getting concerned when they noticed that she had passed out on a few occasions.  About 6 months ago she noticed an episode of a few days of black stools, however these have resolved as of late.  She started having about 8 months ago significant issues with dental cavities and has had to have significant dental work done.  She has noticed that these issues improve with Alka-Seltzer so she has started taking it every day.  She does not use any over-the-counter NSAIDs.  She is fully vaccinated against Covid, otherwise she is due for all vaccines, she has had no recent cancer screenings.   Past Medical/Surgical History: Past Medical History:  Diagnosis Date  . Hypertension     History reviewed. No pertinent surgical history.  Social History:  reports that she has never smoked. She has never used smokeless tobacco. She reports that she does not drink alcohol and does not  use drugs.  Allergies: No Known Allergies  Family History:  History reviewed. No pertinent family history.   Current Outpatient Medications:  .  acetaminophen (TYLENOL) 500 MG tablet, Take 1,000 mg by mouth every 6 (six) hours as needed for moderate pain or headache., Disp: , Rfl:  .  pantoprazole (PROTONIX) 40 MG tablet, Take 1 tablet (40 mg total) by mouth daily., Disp: 90 tablet, Rfl: 1  Review of Systems:  Constitutional: Denies fever, chills, diaphoresis, appetite change and fatigue.  HEENT: Denies photophobia, eye pain, redness, hearing loss, ear pain, congestion, sore throat, rhinorrhea, sneezing, mouth sores, trouble swallowing, neck pain, neck stiffness and tinnitus.   Respiratory: Denies SOB, DOE, cough, chest tightness,  and wheezing.   Cardiovascular: Denies chest pain, palpitations and leg swelling.  Gastrointestinal: Denies  vomiting,  diarrhea,   and abdominal distention.  Genitourinary: Denies dysuria, urgency, frequency, hematuria, flank pain and difficulty urinating.  Endocrine: Denies: hot or cold intolerance, sweats, changes in hair or nails, polyuria, polydipsia. Musculoskeletal: Denies myalgias, back pain, joint swelling, arthralgias and gait problem.  Skin: Denies pallor, rash and wound.  Neurological: Denies dizziness, seizures, weakness, light-headedness, numbness and headaches.  Hematological: Denies adenopathy. Easy bruising, personal or family bleeding history  Psychiatric/Behavioral: Denies suicidal ideation, mood changes, confusion, nervousness, sleep disturbance and agitation    Physical Exam: Vitals:   05/21/20 0704  BP: 140/80  Pulse: 71  Temp: 98.3 F (36.8 C)  TempSrc: Oral  SpO2: 98%  Weight: 145 lb 9.6 oz (66  kg)  Height: 4' 11.84" (1.52 m)   Body mass index is 28.58 kg/m.  Constitutional: NAD, calm, comfortable Eyes: PERRL, lids and conjunctivae normal ENMT: Mucous membranes are moist. Posterior pharynx clear of any exudate or  lesions. Normal dentition. Tympanic membrane is pearly white, no erythema or bulging. Neck: normal, supple, no masses, no thyromegaly Respiratory: clear to auscultation bilaterally, no wheezing, no crackles. Normal respiratory effort. No accessory muscle use.  Cardiovascular: Regular rate and rhythm, no murmurs / rubs / gallops. No extremity edema.  Abdomen: no tenderness to palpation, no masses palpated. No hepatosplenomegaly. Bowel sounds positive.  Neurologic: Grossly intact and nonfocal Psychiatric: Normal judgment and insight. Alert and oriented x 3. Normal mood.    Impression and Plan:  Gastroesophageal reflux disease, unspecified whether esophagitis present Recurrent syncope  Melena -She likely has a degree of GERD, may be with esophagitis/gastritis, but ultimately concerned with possibility of PUD given her history of melena previously and recurrent syncopal events. -Check labs today including CBC, CMP, iron studies. -Start on Protonix 40 mg daily, with low threshold to increase to twice daily. -She has been instructed to avoid all over-the-counter NSAIDs, aspirin including Alka-Seltzer, alcohol.  She has also been provided with an antireflux diet.   Elevated BP without diagnosis of hypertension -She will do ambulatory blood pressure monitoring and return in 3 months for follow-up.     Patient Instructions  -Protonix 40 mg al dia en las maanas.  -Pruebas de sangre hoy, te aviso los [place].  -Haz cita para regresar en 3 meses.   Enfermedad de reflujo gastroesofgico en los adultos Gastroesophageal Reflux Disease, Adult El reflujo gastroesofgico (RGE) ocurre cuando el cido del estmago sube por el tubo que conecta la boca con el estmago (esfago). Normalmente, la comida baja por el esfago y se mantiene en el estmago, donde se la digiere. Cuando una persona tiene RGE, los alimentos y el cido estomacal suelen volver al esfago. Usted puede tener una enfermedad  llamada enfermedad de reflujo gastroesofgico (ERGE) si el reflujo:  Sucede a menudo.  Causa sntomas frecuentes o muy intensos.  Causa problemas tales como dao en el esfago. Cuando esto ocurre, el esfago duele y se hincha (inflama). Con el tiempo, la ERGE puede ocasionar pequeos agujeros (lceras) en el revestimiento del esfago. Cules son las causas? Esta afeccin se debe a un problema en el msculo que se encuentra entre el esfago y [place]. Cuando este msculo est dbil o no es normal, no se cierra correctamente para impedir que los alimentos y el cido regresen del Paramedic. El msculo puede debilitarse debido a lo siguiente:  El consumo de [place].  [place].  Tener cierto tipo de hernia (hernia de hiato).  Consumo de alcohol.  Ciertos alimentos y bebidas, como caf, chocolate, cebollas y [place]. Qu incrementa el riesgo? Es ms probable que tenga esta afeccin si:  Tiene sobrepeso.  Tiene una enfermedad que afecta el tejido conjuntivo.  [place] antiinflamatorios no esteroideos (AINE). Cules son los signos o los sntomas? Los sntomas de esta afeccin incluyen:  Acidez estomacal.  Dificultad o dolor al tragar.  Sensacin de Best boy un bulto en la garganta.  Sabor amargo en la boca.  Mal aliento.  Tener una gran cantidad de saliva.  Estmago inflamado o con Tree surgeon.  Eructos.  Dolor en el pecho. El dolor de pecho puede deberse a distintas afecciones. Asegrese de Teacher, adult education a su mdico si tiene Tourist information centre manager.  Falta de aire o respiracin ruidosa (sibilancias).  Donnal Moat  constante (crnica) o durante la noche.  Desgaste de la superficie de los dientes (esmalte dental).  Prdida de peso. Cmo se trata? El tratamiento depender de la gravedad de los sntomas. El mdico puede sugerirle lo siguiente:  Cambios en la dieta.  Medicamentos.  Clementeen Hoof. Siga estas indicaciones en su casa: Comida y bebida   Siga una dieta como se lo haya indicado  el mdico. Es posible que deba evitar alimentos y bebidas, por ejemplo: ? Caf y t (con o sin cafena). ? Bebidas que contengan alcohol. ? Bebidas energticas y deportivas. ? Bebidas gaseosas y refrescos. ? Chocolate y cacao. ? Menta y [place]. ? Ajo y cebolla. ? Rbano picante. ? Alimentos cidos y condimentados. Estos incluyen todos los tipos de pimientos, [place] en polvo, curry en polvo, vinagre, salsas picantes y [employer]. ? Ctricos y sus jugos, por ejemplo, naranjas, limones y limas. ? Alimentos que [employer]. Estos incluyen salsa roja, [place], salsa picante y pizza con salsa de [place]. ? Alimentos fritos y Radio broadcast assistant. Estos incluyen donas, papas fritas, papitas fritas de bolsa y aderezos con alto contenido de [place]. ? Carnes con alto contenido de [place]. Estas incluye los perros calientes, chuletas o costillas, embutidos, jamn y tocino. ? Productos lcteos ricos en grasas, como leche [place], [place] y [place] crema.  Consuma pequeas cantidades de comida con ms frecuencia. Evite consumir porciones abundantes.  Evite beber grandes cantidades de lquidos con las comidas.  Evite comer 2 o 3horas antes de acostarse.  Evite recostarse inmediatamente despus de comer.  No haga ejercicios enseguida despus de comer. Estilo de vida   No consuma ningn producto que contenga nicotina o tabaco. Estos incluyen cigarrillos, cigarrillos electrnicos y tabaco para Higher education careers adviser. Si necesita ayuda para dejar de fumar, consulte al [employer].  Intente reducir [employer] de estrs. Si necesita ayuda para hacer esto, consulte al mdico.  Si tiene sobrepeso, baje una cantidad de peso saludable para usted. Consulte a su mdico para bajar de peso de [employer]. Indicaciones generales  Est atento a cualquier cambio en los sntomas.  Tome los medicamentos de venta libre y los recetados solamente como se lo haya indicado el mdico. No tome aspirina, ibuprofeno ni otros AINE a menos que el  mdico lo autorice.  Use ropa holgada. No use nada apretado alrededor de la cintura.  Levante (eleve) la cabecera de la cama aproximadamente 6pulgadas (15cm).  Evite inclinarse si al hacerlo empeoran los sntomas.  Concurra a todas las visitas de seguimiento como se lo haya indicado el mdico. Esto es importante. Comunquese con un mdico si:  Aparecen nuevos sntomas.  Adelgaza y no sabe por qu.  Tiene problemas para tragar o le duele cuando traga.  Tiene sibilancias o tos persistente.  Los sntomas no mejoran con Dispensing optician.  Tiene la voz ronca. Solicite ayuda inmediatamente si:  [employer], el cuello, la [place], los dientes o la espalda.  Se siente transpirado, mareado o tiene una sensacin de desvanecimiento.  Siente falta de aire o Tourist information centre manager.  Vomita y el vmito tiene un aspecto similar a la sangre o a los posos de caf.  Pierde el conocimiento (se desmaya).  Las deposiciones (heces) son sanguinolentas o negras.  No puede tragar, beber o comer. Resumen  Si una persona tiene enfermedad de reflujo gastroesofgico (ERGE), los alimentos y el cido estomacal suben al esfago y causan sntomas o problemas tales como dao en el esfago.  El tratamiento depender de la  gravedad de los sntomas.  Siga una Lexicographer se lo haya indicado el [place] todos los medicamentos solamente como se lo haya indicado el mdico. Esta informacin no tiene Marine scientist el consejo del mdico. Asegrese de hacerle al mdico cualquier pregunta que tenga. Document Revised: 03/23/2018 Document Reviewed: 03/23/2018 Elsevier Patient Education  2020 [place], MD [facility]

## 2020-05-22 LAB — COMPREHENSIVE METABOLIC PANEL
AG Ratio: 1.8 (calc) (ref 1.0–2.5)
ALT: 20 U/L (ref 6–29)
AST: 16 U/L (ref 10–35)
Albumin: 4.8 g/dL (ref 3.6–5.1)
Alkaline phosphatase (APISO): 78 U/L (ref 37–153)
BUN: 23 mg/dL (ref 7–25)
CO2: 25 mmol/L (ref 20–32)
Calcium: 9.4 mg/dL (ref 8.6–10.4)
Chloride: 104 mmol/L (ref 98–110)
Creat: 0.66 mg/dL (ref 0.50–1.05)
Globulin: 2.7 g/dL (calc) (ref 1.9–3.7)
Glucose, Bld: 90 mg/dL (ref 65–99)
Potassium: 4.1 mmol/L (ref 3.5–5.3)
Sodium: 139 mmol/L (ref 135–146)
Total Bilirubin: 0.4 mg/dL (ref 0.2–1.2)
Total Protein: 7.5 g/dL (ref 6.1–8.1)

## 2020-05-22 LAB — LIPID PANEL
Cholesterol: 217 mg/dL — ABNORMAL HIGH (ref <200)
HDL: 47 mg/dL — ABNORMAL LOW (ref >=50)
LDL Cholesterol (Calc): 151 mg/dL (calc) — ABNORMAL HIGH
Non-HDL Cholesterol (Calc): 170 mg/dL (calc) — ABNORMAL HIGH (ref <130)
Total CHOL/HDL Ratio: 4.6 (calc) (ref <5.0)
Triglycerides: 87 mg/dL (ref <150)

## 2020-05-22 LAB — CBC WITH DIFFERENTIAL/PLATELET
Absolute Monocytes: 309 cells/uL (ref 200–950)
Basophils Absolute: 59 cells/uL (ref 0–200)
Basophils Relative: 1.2 %
Eosinophils Absolute: 88 cells/uL (ref 15–500)
Eosinophils Relative: 1.8 %
HCT: 40.7 % (ref 35.0–45.0)
Hemoglobin: 13.4 g/dL (ref 11.7–15.5)
Lymphs Abs: 1965 cells/uL (ref 850–3900)
MCH: 28.9 pg (ref 27.0–33.0)
MCHC: 32.9 g/dL (ref 32.0–36.0)
MCV: 87.9 fL (ref 80.0–100.0)
MPV: 11.2 fL (ref 7.5–12.5)
Monocytes Relative: 6.3 %
Neutro Abs: 2479 cells/uL (ref 1500–7800)
Neutrophils Relative %: 50.6 %
Platelets: 251 10*3/uL (ref 140–400)
RBC: 4.63 10*6/uL (ref 3.80–5.10)
RDW: 13 % (ref 11.0–15.0)
Total Lymphocyte: 40.1 %
WBC: 4.9 10*3/uL (ref 3.8–10.8)

## 2020-05-22 LAB — IRON,TIBC AND FERRITIN PANEL
%SAT: 18 % (calc) (ref 16–45)
Ferritin: 76 ng/mL (ref 16–232)
Iron: 70 ug/dL (ref 45–160)
TIBC: 392 mcg/dL (calc) (ref 250–450)

## 2020-05-22 LAB — TSH: TSH: 1.36 mIU/L (ref 0.40–4.50)

## 2020-05-22 LAB — VITAMIN D 25 HYDROXY (VIT D DEFICIENCY, FRACTURES): Vit D, 25-Hydroxy: 22 ng/mL — ABNORMAL LOW (ref 30–100)

## 2020-05-22 LAB — VITAMIN B12: Vitamin B-12: 733 pg/mL (ref 200–1100)

## 2020-05-23 ENCOUNTER — Other Ambulatory Visit: Payer: Self-pay | Admitting: Internal Medicine

## 2020-05-23 ENCOUNTER — Encounter: Payer: Self-pay | Admitting: Internal Medicine

## 2020-05-23 DIAGNOSIS — E559 Vitamin D deficiency, unspecified: Secondary | ICD-10-CM

## 2020-05-23 MED ORDER — VITAMIN D (ERGOCALCIFEROL) 1.25 MG (50000 UNIT) PO CAPS: 50000.0000 [IU] | ORAL_CAPSULE | ORAL | 0 refills | Status: AC

## 2020-05-23 NOTE — Progress Notes (Unsigned)
vit

## 2020-08-27 ENCOUNTER — Encounter: Payer: Self-pay | Admitting: Internal Medicine

## 2020-08-27 DIAGNOSIS — Z0289 Encounter for other administrative examinations: Secondary | ICD-10-CM

## 2020-11-20 ENCOUNTER — Other Ambulatory Visit: Payer: Self-pay

## 2020-11-20 ENCOUNTER — Encounter: Payer: Self-pay | Admitting: Internal Medicine

## 2020-11-20 ENCOUNTER — Ambulatory Visit (INDEPENDENT_AMBULATORY_CARE_PROVIDER_SITE_OTHER): Payer: Self-pay | Admitting: Internal Medicine

## 2020-11-20 ENCOUNTER — Other Ambulatory Visit (HOSPITAL_COMMUNITY)
Admission: RE | Admit: 2020-11-20 | Discharge: 2020-11-20 | Disposition: A | Discharge status: Home / Self Care | Payer: Self-pay | Source: Ambulatory Visit | Attending: Internal Medicine | Admitting: Internal Medicine

## 2020-11-20 VITALS — BP 120/80 | HR 71 | Temp 98.9°F | Ht 60.5 in | Wt 133.7 lb

## 2020-11-20 DIAGNOSIS — E785 Hyperlipidemia, unspecified: Secondary | ICD-10-CM

## 2020-11-20 DIAGNOSIS — Z1231 Encounter for screening mammogram for malignant neoplasm of breast: Secondary | ICD-10-CM

## 2020-11-20 DIAGNOSIS — Z1211 Encounter for screening for malignant neoplasm of colon: Secondary | ICD-10-CM

## 2020-11-20 DIAGNOSIS — Z23 Encounter for immunization: Secondary | ICD-10-CM

## 2020-11-20 DIAGNOSIS — Z0000 Encounter for general adult medical examination without abnormal findings: Secondary | ICD-10-CM

## 2020-11-20 DIAGNOSIS — Z124 Encounter for screening for malignant neoplasm of cervix: Secondary | ICD-10-CM | POA: Insufficient documentation

## 2020-11-20 DIAGNOSIS — E559 Vitamin D deficiency, unspecified: Secondary | ICD-10-CM

## 2020-11-20 LAB — LIPID PANEL
Cholesterol: 176 mg/dL (ref 0–200)
HDL: 41.1 mg/dL (ref >39.00)
LDL Cholesterol: 115 mg/dL — ABNORMAL HIGH (ref 0–99)
NonHDL: 134.82
Total CHOL/HDL Ratio: 4
Triglycerides: 98 mg/dL (ref 0.0–149.0)
VLDL: 19.6 mg/dL (ref 0.0–40.0)

## 2020-11-20 LAB — VITAMIN D 25 HYDROXY (VIT D DEFICIENCY, FRACTURES): VITD: 21.78 ng/mL — ABNORMAL LOW (ref 30.00–100.00)

## 2020-11-20 NOTE — Addendum Note (Signed)
Addended by: Westley Hummer B on: 11/20/2020 09:25 AM   Modules accepted: Orders

## 2020-11-20 NOTE — Addendum Note (Signed)
Addended by: Elmer Picker on: 11/20/2020 07:56 AM   Modules accepted: Orders

## 2020-11-20 NOTE — Progress Notes (Signed)
   Established Patient Office Visit     This visit occurred during the SARS-CoV-2 public health emergency.  Safety protocols were in place, including screening questions prior to the visit, additional usage of staff PPE, and extensive cleaning of exam room while observing appropriate contact time as indicated for disinfecting solutions.    CC/Reason for Visit: Annual Preventive exam   HPI: Patricia Stevenson is a 59 y.o. female who is coming in today for the above mentioned reasons. Past Medical History is significant for: Hyperlipidemia and Vit D deficiency.  She has been doing quite well since I last saw her 6 months ago.  She was told she was hyperlipidemic so she has lost 11 pounds through lifestyle changes.  Her reflux has improved with diet modifications to the point where she has stopped taking her proton pump inhibitor.  She has routine eye and dental care.  She is overdue for her Covid booster her shingles and her tetanus vaccines.  She is overdue for all cancer screenings including cervical, breast, colon.   Past Medical/Surgical History: Past Medical History:  Diagnosis Date  . Hypertension     No past surgical history on file.  Social History:  reports that she has never smoked. She has never used smokeless tobacco. She reports that she does not drink alcohol and does not use drugs.  Allergies: No Known Allergies  Family History:  No history of heart disease, cancer, stroke that she is aware of, mother had Alzheimer's dementia  Current Outpatient Medications:  .  acetaminophen (TYLENOL) 500 MG tablet, Take 1,000 mg by mouth every 6 (six) hours as needed for moderate pain or headache., Disp: , Rfl:  .  pantoprazole (PROTONIX) 40 MG tablet, Take 1 tablet (40 mg total) by mouth daily. (Patient not taking: Reported on 11/20/2020), Disp: 90 tablet, Rfl: 1  Review of Systems:  Constitutional: Denies fever, chills, diaphoresis, appetite change and fatigue.  HEENT: Denies  photophobia, eye pain, redness, hearing loss, ear pain, congestion, sore throat, rhinorrhea, sneezing, mouth sores, trouble swallowing, neck pain, neck stiffness and tinnitus.   Respiratory: Denies SOB, DOE, cough, chest tightness,  and wheezing.   Cardiovascular: Denies chest pain, palpitations and leg swelling.  Gastrointestinal: Denies nausea, vomiting, abdominal pain, diarrhea, constipation, blood in stool and abdominal distention.  Genitourinary: Denies dysuria, urgency, frequency, hematuria, flank pain and difficulty urinating.  Endocrine: Denies: hot or cold intolerance, sweats, changes in hair or nails, polyuria, polydipsia. Musculoskeletal: Denies myalgias, back pain, joint swelling, arthralgias and gait problem.  Skin: Denies pallor, rash and wound.  Neurological: Denies dizziness, seizures, syncope, weakness, light-headedness, numbness and headaches.  Hematological: Denies adenopathy. Easy bruising, personal or family bleeding history  Psychiatric/Behavioral: Denies suicidal ideation, mood changes, confusion, nervousness, sleep disturbance and agitation    Physical Exam: Vitals:   11/20/20 0702  BP: 120/80  Pulse: 71  Temp: 98.9 F (37.2 C)  TempSrc: Oral  SpO2: 99%  Weight: 133 lb 11.2 oz (60.6 kg)  Height: 5' 0.5" (1.537 m)    Body mass index is 25.68 kg/m.   Constitutional: NAD, calm, comfortable Eyes: PERRL, lids and conjunctivae normal ENMT: Mucous membranes are moist. Posterior pharynx clear of any exudate or lesions. Normal dentition. Tympanic membrane is pearly white, no erythema or bulging. Neck: normal, supple, no masses, no thyromegaly Respiratory: clear to auscultation bilaterally, no wheezing, no crackles. Normal respiratory effort. No accessory muscle use.  Cardiovascular: Regular rate and rhythm, no murmurs / rubs / gallops. No extremity edema.   2+ pedal pulses.  Abdomen: no tenderness, no masses palpated. No hepatosplenomegaly. Bowel sounds positive.   Musculoskeletal: no clubbing / cyanosis. No joint deformity upper and lower extremities. Good ROM, no contractures. Normal muscle tone.  Skin: no rashes, lesions, ulcers. No induration Neurologic: CN 2-12 grossly intact. Sensation intact, DTR normal. Strength 5/5 in all 4.  Psychiatric: Normal judgment and insight. Alert and oriented x 3. Normal mood.    Impression and Plan:  Encounter for preventive health examination -She has routine eye and dental care. -She will get her first shingles and her Tdap in office today, she will get Covid booster at pharmacy, otherwise immunizations are up-to-date. -Screening labs today. -Healthy lifestyle discussed in detail. -Pap smear performed in office today. -Sent to GI for initial screening colonoscopy. -Mammogram requested today for breast cancer screening.  Vitamin D deficiency  - Plan: VITAMIN D 25 Hydroxy (Vit-D Deficiency, Fractures)  Hyperlipidemia, unspecified hyperlipidemia type  - Plan: Lipid panel -Last LDL was 151, she is not on medication.  Colon cancer screening  - Plan: Ambulatory referral to Gastroenterology  Breast cancer screening by mammogram  - Plan: MM Digital Screening  Need for Tdap vaccination -Tdap administered today.  Need for shingles vaccine -First shingles vaccine administered today.    Patient Instructions   -Nice seeing you today!!  -Lab work today; will notify you once results are available.  -tetanus and shingles vaccines today  -Schedule follow up in 1 year or sooner as needed.   Preventive Care 40-64 Years Old, Female Preventive care refers to lifestyle choices and visits with your health care provider that can promote health and wellness. This includes:  A yearly physical exam. This is also called an annual wellness visit.  Regular dental and eye exams.  Immunizations.  Screening for certain conditions.  Healthy lifestyle choices, such as: ? Eating a healthy diet. ? Getting  regular exercise. ? Not using drugs or products that contain nicotine and tobacco. ? Limiting alcohol use. What can I expect for my preventive care visit? Physical exam Your health care provider will check your:  Height and weight. These may be used to calculate your BMI (body mass index). BMI is a measurement that tells if you are at a healthy weight.  Heart rate and blood pressure.  Body temperature.  Skin for abnormal spots. Counseling Your health care provider may ask you questions about your:  Past medical problems.  Family's medical history.  Alcohol, tobacco, and drug use.  Emotional well-being.  Home life and relationship well-being.  Sexual activity.  Diet, exercise, and sleep habits.  Work and work environment.  Access to firearms.  Method of birth control.  Menstrual cycle.  Pregnancy history. What immunizations do I need? Vaccines are usually given at various ages, according to a schedule. Your health care provider will recommend vaccines for you based on your age, medical history, and lifestyle or other factors, such as travel or where you work.   What tests do I need? Blood tests  Lipid and cholesterol levels. These may be checked every 5 years, or more often if you are over 50 years old.  Hepatitis C test.  Hepatitis B test. Screening  Lung cancer screening. You may have this screening every year starting at age 55 if you have a 30-pack-year history of smoking and currently smoke or have quit within the past 15 years.  Colorectal cancer screening. ? All adults should have this screening starting at age 50 and continuing until age 75. ?   Your health care provider may recommend screening at age 16 if you are at increased risk. ? You will have tests every 1-10 years, depending on your results and the type of screening test.  Diabetes screening. ? This is done by checking your blood sugar (glucose) after you have not eaten for a while  (fasting). ? You may have this done every 1-3 years.  Mammogram. ? This may be done every 1-2 years. ? Talk with your health care provider about when you should start having regular mammograms. This may depend on whether you have a family history of breast cancer.  BRCA-related cancer screening. This may be done if you have a family history of breast, ovarian, tubal, or peritoneal cancers.  Pelvic exam and Pap test. ? This may be done every 3 years starting at age 74. ? Starting at age 79, this may be done every 5 years if you have a Pap test in combination with an HPV test. Other tests  STD (sexually transmitted disease) testing, if you are at risk.  Bone density scan. This is done to screen for osteoporosis. You may have this scan if you are at high risk for osteoporosis. Talk with your health care provider about your test results, treatment options, and if necessary, the need for more tests. Follow these instructions at home: Eating and drinking  Eat a diet that includes fresh fruits and vegetables, whole grains, lean protein, and low-fat dairy products.  Take vitamin and mineral supplements as recommended by your health care provider.  Do not drink alcohol if: ? Your health care provider tells you not to drink. ? You are pregnant, may be pregnant, or are planning to become pregnant.  If you drink alcohol: ? Limit how much you have to 0-1 drink a day. ? Be aware of how much alcohol is in your drink. In the [place] one drink equals one 12 oz bottle of beer (355 mL), one 5 oz glass of wine (148 mL), or one 1 oz glass of hard liquor (44 mL).   Lifestyle  Take daily care of your teeth and gums. Brush your teeth every morning and night with fluoride toothpaste. Floss one time each day.  Stay active. Exercise for at least 30 minutes 5 or more days each week.  Do not use any products that contain nicotine or tobacco, such as cigarettes, e-cigarettes, and chewing tobacco. If you need  help quitting, ask your health care provider.  Do not use drugs.  If you are sexually active, practice safe sex. Use a condom or other form of protection to prevent STIs (sexually transmitted infections).  If you do not wish to become pregnant, use a form of birth control. If you plan to become pregnant, see your health care provider for a prepregnancy visit.  If told by your health care provider, take low-dose aspirin daily starting at age 41.  Find healthy ways to cope with stress, such as: ? Meditation, yoga, or listening to music. ? Journaling. ? Talking to a trusted person. ? Spending time with friends and family. Safety  Always wear your seat belt while driving or riding in a vehicle.  Do not drive: ? If you have been drinking alcohol. Do not ride with someone who has been drinking. ? When you are tired or distracted. ? While texting.  Wear a helmet and other protective equipment during sports activities.  If you have firearms in your house, make sure you follow all gun safety procedures. What's  next?  Visit your health care provider once a year for an annual wellness visit.  Ask your health care provider how often you should have your eyes and teeth checked.  Stay up to date on all vaccines. This information is not intended to replace advice given to you by your health care provider. Make sure you discuss any questions you have with your health care provider. Document Revised: 05/13/2020 Document Reviewed: 04/20/2018 Elsevier Patient Education  2021 [place], MD [facility]

## 2020-11-20 NOTE — Patient Instructions (Addendum)
-Nice seeing you today!!  -Lab work today; will notify you once results are available.  -tetanus and shingles vaccines today  -Schedule follow up in 1 year or sooner as needed.   Preventive Care 51-58 Years Old, Female Preventive care refers to lifestyle choices and visits with your health care provider that can promote health and wellness. This includes:  A yearly physical exam. This is also called an annual wellness visit.  Regular dental and eye exams.  Immunizations.  Screening for certain conditions.  Healthy lifestyle choices, such as: ? Eating a healthy diet. ? Getting regular exercise. ? Not using drugs or products that contain nicotine and tobacco. ? Limiting alcohol use. What can I expect for my preventive care visit? Physical exam Your health care provider will check your:  Height and weight. These may be used to calculate your BMI (body mass index). BMI is a measurement that tells if you are at a healthy weight.  Heart rate and blood pressure.  Body temperature.  Skin for abnormal spots. Counseling Your health care provider may ask you questions about your:  Past medical problems.  Family's medical history.  Alcohol, tobacco, and drug use.  Emotional well-being.  Home life and relationship well-being.  Sexual activity.  Diet, exercise, and sleep habits.  Work and work Statistician.  Access to firearms.  Method of birth control.  Menstrual cycle.  Pregnancy history. What immunizations do I need? Vaccines are usually given at various ages, according to a schedule. Your health care provider will recommend vaccines for you based on your age, medical history, and lifestyle or other factors, such as travel or where you work.   What tests do I need? Blood tests  Lipid and cholesterol levels. These may be checked every 5 years, or more often if you are over 41 years old.  Hepatitis C test.  Hepatitis B test. Screening  Lung cancer  screening. You may have this screening every year starting at age 60 if you have a 30-pack-year history of smoking and currently smoke or have quit within the past 15 years.  Colorectal cancer screening. ? All adults should have this screening starting at age 105 and continuing until age 13. ? Your health care provider may recommend screening at age 83 if you are at increased risk. ? You will have tests every 1-10 years, depending on your results and the type of screening test.  Diabetes screening. ? This is done by checking your blood sugar (glucose) after you have not eaten for a while (fasting). ? You may have this done every 1-3 years.  Mammogram. ? This may be done every 1-2 years. ? Talk with your health care provider about when you should start having regular mammograms. This may depend on whether you have a family history of breast cancer.  BRCA-related cancer screening. This may be done if you have a family history of breast, ovarian, tubal, or peritoneal cancers.  Pelvic exam and Pap test. ? This may be done every 3 years starting at age 51. ? Starting at age 15, this may be done every 5 years if you have a Pap test in combination with an HPV test. Other tests  STD (sexually transmitted disease) testing, if you are at risk.  Bone density scan. This is done to screen for osteoporosis. You may have this scan if you are at high risk for osteoporosis. Talk with your health care provider about your test results, treatment options, and if necessary, the need for  more tests. Follow these instructions at home: Eating and drinking  Eat a diet that includes fresh fruits and vegetables, whole grains, lean protein, and low-fat dairy products.  Take vitamin and mineral supplements as recommended by your health care provider.  Do not drink alcohol if: ? Your health care provider tells you not to drink. ? You are pregnant, may be pregnant, or are planning to become pregnant.  If you  drink alcohol: ? Limit how much you have to 0-1 drink a day. ? Be aware of how much alcohol is in your drink. In the [place] one drink equals one 12 oz bottle of beer (355 mL), one 5 oz glass of wine (148 mL), or one 1 oz glass of hard liquor (44 mL).   Lifestyle  Take daily care of your teeth and gums. Brush your teeth every morning and night with fluoride toothpaste. Floss one time each day.  Stay active. Exercise for at least 30 minutes 5 or more days each week.  Do not use any products that contain nicotine or tobacco, such as cigarettes, e-cigarettes, and chewing tobacco. If you need help quitting, ask your health care provider.  Do not use drugs.  If you are sexually active, practice safe sex. Use a condom or other form of protection to prevent STIs (sexually transmitted infections).  If you do not wish to become pregnant, use a form of birth control. If you plan to become pregnant, see your health care provider for a prepregnancy visit.  If told by your health care provider, take low-dose aspirin daily starting at age 19.  Find healthy ways to cope with stress, such as: ? Meditation, yoga, or listening to music. ? Journaling. ? Talking to a trusted person. ? Spending time with friends and family. Safety  Always wear your seat belt while driving or riding in a vehicle.  Do not drive: ? If you have been drinking alcohol. Do not ride with someone who has been drinking. ? When you are tired or distracted. ? While texting.  Wear a helmet and other protective equipment during sports activities.  If you have firearms in your house, make sure you follow all gun safety procedures. What's next?  Visit your health care provider once a year for an annual wellness visit.  Ask your health care provider how often you should have your eyes and teeth checked.  Stay up to date on all vaccines. This information is not intended to replace advice given to you by your health care provider.  Make sure you discuss any questions you have with your health care provider. Document Revised: 05/13/2020 Document Reviewed: 04/20/2018 Elsevier Patient Education  2021 [employer].

## 2020-11-21 ENCOUNTER — Other Ambulatory Visit: Payer: Self-pay | Admitting: Internal Medicine

## 2020-11-21 DIAGNOSIS — E559 Vitamin D deficiency, unspecified: Secondary | ICD-10-CM

## 2020-11-21 MED ORDER — VITAMIN D (ERGOCALCIFEROL) 1.25 MG (50000 UNIT) PO CAPS: 50000.0000 [IU] | ORAL_CAPSULE | ORAL | 0 refills | Status: AC

## 2020-11-24 LAB — CYTOLOGY - PAP
Comment: NEGATIVE
Diagnosis: NEGATIVE
High risk HPV: NEGATIVE

## 2020-11-25 ENCOUNTER — Encounter: Payer: Self-pay | Admitting: Gastroenterology

## 2020-12-30 ENCOUNTER — Encounter: Payer: Self-pay | Admitting: Physician Assistant

## 2021-01-14 ENCOUNTER — Telehealth: Payer: Self-pay | Admitting: Internal Medicine

## 2021-01-14 NOTE — Telephone Encounter (Signed)
Pt is calling in stating that she need to have her information sent to the breast center so that she is able to make an appointment.  Pt would like to have a call back.

## 2021-01-14 NOTE — Telephone Encounter (Signed)
See message, thanks.

## 2021-01-22 ENCOUNTER — Ambulatory Visit: Payer: Self-pay | Admitting: Physician Assistant

## 2021-01-22 ENCOUNTER — Encounter: Payer: Self-pay | Admitting: Physician Assistant

## 2021-01-22 ENCOUNTER — Other Ambulatory Visit: Payer: Self-pay

## 2021-01-22 VITALS — BP 140/78 | HR 72 | Ht 59.0 in | Wt 127.0 lb

## 2021-01-22 DIAGNOSIS — Z1212 Encounter for screening for malignant neoplasm of rectum: Secondary | ICD-10-CM

## 2021-01-22 DIAGNOSIS — R1013 Epigastric pain: Secondary | ICD-10-CM

## 2021-01-22 DIAGNOSIS — K5900 Constipation, unspecified: Secondary | ICD-10-CM

## 2021-01-22 DIAGNOSIS — Z1211 Encounter for screening for malignant neoplasm of colon: Secondary | ICD-10-CM

## 2021-01-22 MED ORDER — PLENVU 140 G PO SOLR: ORAL | 0 refills | Status: DC

## 2021-01-22 NOTE — Patient Instructions (Signed)
If you are age 58 or older, your body mass index should be between 23-30. Your Body mass index is 25.65 kg/m. If this is out of the aforementioned range listed, please consider follow up with your Primary Care Provider.  If you are age 50 or younger, your body mass index should be between 19-25. Your Body mass index is 25.65 kg/m. If this is out of the aformentioned range listed, please consider follow up with your Primary Care Provider.   You have been scheduled for an endoscopy and colonoscopy. Please follow the written instructions given to you at your visit today. Please pick up your prep supplies at the pharmacy within the next 1-3 days. If you use inhalers (even only as needed), please bring them with you on the day of your procedure.  The [facility] providers would like to encourage you to use [facility] to communicate with providers for non-urgent requests or questions.  Due to long hold times on the telephone, sending your provider a message by [facility] may be a faster and more efficient way to get a response.  Please allow 48 business hours for a response.  Please remember that this is for non-urgent requests.   It was a pleasure to see you today!  Thank you for trusting me with your gastrointestinal care!     Ellouise Newer, PA-C

## 2021-01-22 NOTE — Progress Notes (Signed)
Reviewed and agree with documentation and assessment and plan. K. Veena Jesenya Bowditch , MD   

## 2021-01-22 NOTE — Progress Notes (Signed)
Chief Complaint: Abdominal pain, gas and weight loss  HPI:    Patricia Stevenson is a 60 year old Hispanic female with a past medical history as listed below, who was referred to me by Isaac Bliss, [place]* for a complaint of abdominal pain, gas and weight loss.      11/20/2020 patient seen by PCP for annual preventative exam.  At that time they discussed that she had lost 11 pounds through lifestyle changes due to her hyperlipidemia.  Her reflux had improved with diet modifications to the point where she has stopped taking her PPI.  Was recommended that she see [place] for a screening colonoscopy.    Today, the patient presents to clinic not accompanied by an interpreter but tells me that she feels perfectly fine without one (though she is offered one).  She brings with her a detailed list of her symptoms including epigastric pain and constipation.  She brings with her a detailed list of foods which she eats and when she eats them and amounts of eating as well as exercise.  She explains to me that her stomach hurts after lunch somewhere between 12 and 4 PM.  Typically she will do 5 things to help with this epigastric discomfort such as drinking mineral water which helps her burp and feel better as well as walking around lifting up her breasts which makes her feel better and doing a twisting workout motion in a "Buddha" yoga move.  Eventually this relieves the pain but this pain is there pretty much every day.  Rated as a 5-01/2009.  Apparently was on what sounds like an antiacid from her PCP for a week but this made no change to symptoms.  She does carry Alka-Seltzer in her pocket and will sometimes drink this when symptoms get bad and it does seem to help but "I am not sure if that is just in my head".  Does believe that all of her symptoms started after she had some dental work done in which they removed a large amount of her teeth and she was unable to chew her food as well as before.    As far as constipation this  was previously an issue and it sounds like she was on a stool softener but now she is having regular stools.     Patient has lost about 19 pounds over the past 6 months or so but has been exercising and eating better.    Does relay that she is the youngest of 7 children and her family and seems to carry stress and anxiety in her stomach and always has done.    Denies fever, chills or blood in her stool.  Past Medical History:  Diagnosis Date  . Hypertension     Current Outpatient Medications  Medication Sig Dispense Refill  . acetaminophen (TYLENOL) 500 MG tablet Take 1,000 mg by mouth every 6 (six) hours as needed for moderate pain or headache.    . pantoprazole (PROTONIX) 40 MG tablet Take 1 tablet (40 mg total) by mouth daily. (Patient not taking: Reported on 11/20/2020) 90 tablet 1  . Vitamin D, Ergocalciferol, (DRISDOL) 1.25 MG (50000 UNIT) CAPS capsule Take 1 capsule (50,000 Units total) by mouth every 7 (seven) days for 12 doses. 12 capsule 0   No current facility-administered medications for this visit.    Allergies as of 01/22/2021  . (No Known Allergies)    No family history on file.  Social History   Socioeconomic History  . Marital status:  Married    Spouse name: Not on file  . Number of children: Not on file  . Years of education: Not on file  . Highest education level: Not on file  Occupational History  . Not on file  Tobacco Use  . Smoking status: Never Smoker  . Smokeless tobacco: Never Used  Substance and Sexual Activity  . Alcohol use: No  . Drug use: No  . Sexual activity: Not on file  Other Topics Concern  . Not on file  Social History Narrative  . Not on file   Social Determinants of Health   Financial Resource Strain: Not on file  Food Insecurity: Not on file  Transportation Needs: Not on file  Physical Activity: Not on file  Stress: Not on file  Social Connections: Not on file  Intimate Partner Violence: Not on file    Review of Systems:     Constitutional: No weight loss, fever or chills Skin: No rash  Cardiovascular: No chest pain Respiratory: No SOB  Gastrointestinal: See HPI and otherwise negative Genitourinary: No dysuria  Neurological: No headache, dizziness or syncope Musculoskeletal: No new muscle or joint pain Hematologic: No bleeding  Psychiatric: No history of depression or anxiety   Physical Exam:  Vital signs: BP 140/78   Pulse 72   Ht 4\' 11"  (1.499 m)   Wt 127 lb (57.6 kg)   BMI 25.65 kg/m   Constitutional:   Pleasant Hispanic female appears to be in NAD, Well developed, Well nourished, alert and cooperative Head:  Normocephalic and atraumatic. Eyes:   PEERL, EOMI. No icterus. Conjunctiva pink. Ears:  Normal auditory acuity. Neck:  Supple Throat: Oral cavity and pharynx without inflammation, swelling or lesion.  Respiratory: Respirations even and unlabored. Lungs clear to auscultation bilaterally.   No wheezes, crackles, or rhonchi.  Cardiovascular: Normal S1, S2. No MRG. Regular rate and rhythm. No peripheral edema, cyanosis or pallor.  Gastrointestinal:  Soft, nondistended, mild epigastric TTP to deep palpation. No rebound or guarding. Normal bowel sounds. No appreciable masses or hepatomegaly. Rectal:  Not performed.  Msk:  Symmetrical without gross deformities. Without edema, no deformity or joint abnormality.  Neurologic:  Alert and  oriented x4;  grossly normal neurologically.  Skin:   Dry and intact without significant lesions or rashes. Psychiatric: Demonstrates good judgement and reason without abnormal affect or behaviors.  RELEVANT LABS AND IMAGING: CBC    Component Value Date/Time   WBC 4.9 05/21/2020 0811   RBC 4.63 05/21/2020 0811   HGB 13.4 05/21/2020 0811   HCT 40.7 05/21/2020 0811   PLT 251 05/21/2020 0811   MCV 87.9 05/21/2020 0811   MCH 28.9 05/21/2020 0811   MCHC 32.9 05/21/2020 0811   RDW 13.0 05/21/2020 0811   LYMPHSABS 1,965 05/21/2020 0811   EOSABS 88 05/21/2020  0811   BASOSABS 59 05/21/2020 0811    CMP     Component Value Date/Time   NA 139 05/21/2020 0811   K 4.1 05/21/2020 0811   CL 104 05/21/2020 0811   CO2 25 05/21/2020 0811   GLUCOSE 90 05/21/2020 0811   BUN 23 05/21/2020 0811   CREATININE 0.66 05/21/2020 0811   CALCIUM 9.4 05/21/2020 0811   PROT 7.5 05/21/2020 0811   AST 16 05/21/2020 0811   ALT 20 05/21/2020 0811   BILITOT 0.4 05/21/2020 0811    Assessment: 1.  Epigastric pain: On a daily basis typically after meals, no help from a PPI trial for a week in the past; consider  gastritis+/- functional dyspepsia 2.  Constipation: Mostly under control now with diet changes 3.  Screening for colorectal cancer: Patient is 26 and never had a screening colonoscopy  Plan: 1.  Scheduled patient for a diagnostic EGD and screening colonoscopy in the [place] with Dr. Silverio Decamp.  Did provide the patient a detailed list of risks for procedures and she agrees to proceed. 2.  Encouraged the patient to continue her current measures in order to help with symptoms as they do seem to help her.  Had previously been tried on a week of a PPI with no change.  May need to consider for a longer trial in the future. 3.  Briefly discussed IBS/functional dyspepsia, this may be some of her symptoms that she tells me "a lot of this is in my head". 4.  Patient to follow in clinic per recommendations from Dr. Silverio Decamp after time of procedures.  Ellouise Newer, PA-C [facility] 01/22/2021, 9:36 AM  Cc: Isaac Bliss, [place]*

## 2021-01-26 ENCOUNTER — Other Ambulatory Visit: Payer: Self-pay

## 2021-01-26 ENCOUNTER — Encounter: Payer: Self-pay | Admitting: Gastroenterology

## 2021-01-26 ENCOUNTER — Ambulatory Visit (AMBULATORY_SURGERY_CENTER): Payer: Self-pay | Admitting: Gastroenterology

## 2021-01-26 VITALS — BP 112/79 | HR 66 | Temp 98.7°F | Resp 14 | Ht 59.0 in | Wt 127.0 lb

## 2021-01-26 DIAGNOSIS — K2970 Gastritis, unspecified, without bleeding: Secondary | ICD-10-CM

## 2021-01-26 DIAGNOSIS — Z1211 Encounter for screening for malignant neoplasm of colon: Secondary | ICD-10-CM

## 2021-01-26 DIAGNOSIS — K5900 Constipation, unspecified: Secondary | ICD-10-CM

## 2021-01-26 DIAGNOSIS — K319 Disease of stomach and duodenum, unspecified: Secondary | ICD-10-CM

## 2021-01-26 DIAGNOSIS — R1013 Epigastric pain: Secondary | ICD-10-CM

## 2021-01-26 DIAGNOSIS — K259 Gastric ulcer, unspecified as acute or chronic, without hemorrhage or perforation: Secondary | ICD-10-CM

## 2021-01-26 MED ORDER — SODIUM CHLORIDE 0.9 % IV SOLN: 500.0000 mL | INTRAVENOUS | Status: DC

## 2021-01-26 MED ORDER — OMEPRAZOLE 40 MG PO CPDR: 40.0000 mg | DELAYED_RELEASE_CAPSULE | Freq: Every day | ORAL | 0 refills | Status: DC

## 2021-01-26 NOTE — Progress Notes (Signed)
Called to room to assist during endoscopic procedure.  Patient ID and intended procedure confirmed with present staff. Received instructions for my participation in the procedure from the performing physician.  

## 2021-01-26 NOTE — Op Note (Signed)
[facility] Patient Name: Patricia Stevenson Procedure Date: 01/26/2021 3:18 PM MRN: [id number] Endoscopist: Mauri Pole , MD Age: 60 Referring MD:  Date of Birth: 1960-11-18 Gender: Female Account #: [id number] Procedure:                Upper GI endoscopy Indications:              Epigastric abdominal pain Medicines:                Monitored Anesthesia Care Procedure:                Pre-Anesthesia Assessment:                           - Prior to the procedure, a History and Physical                            was performed, and patient medications and                            allergies were reviewed. The patient's tolerance of                            previous anesthesia was also reviewed. The risks                            and benefits of the procedure and the sedation                            options and risks were discussed with the patient.                            All questions were answered, and informed consent                            was obtained. Prior Anticoagulants: The patient has                            taken no previous anticoagulant or antiplatelet                            agents. ASA Grade Assessment: II - A patient with                            mild systemic disease. After reviewing the risks                            and benefits, the patient was deemed in                            satisfactory condition to undergo the procedure.                           After obtaining informed consent, the endoscope was  passed under direct vision. Throughout the                            procedure, the patient's blood pressure, pulse, and                            oxygen saturations were monitored continuously. The                            Endoscope was introduced through the mouth, and                            advanced to the second part of duodenum. The upper                            GI endoscopy was  accomplished without difficulty.                            The patient tolerated the procedure well. Scope In: Scope Out: Findings:                 The Z-line was regular and was found 36 cm from the                            incisors.                           No gross lesions were noted in the entire esophagus.                           The gastroesophageal flap valve was visualized                            endoscopically and classified as Hill Grade II                            (fold present, opens with respiration).                           Patchy moderate inflammation characterized by                            congestion (edema), erosions, erythema and shallow                            ulcerations was found in the gastric antrum and in                            the prepyloric region of the stomach. Biopsies were                            taken with a cold forceps for histology. Biopsies  were taken with a cold forceps for Helicobacter                            pylori testing.                           The cardia and gastric fundus were normal on                            retroflexion.                           The examined duodenum was normal. Complications:            No immediate complications. Estimated Blood Loss:     Estimated blood loss was minimal. Impression:               - Z-line regular, 36 cm from the incisors.                           - No gross lesions in esophagus.                           - Gastroesophageal flap valve classified as Hill                            Grade II (fold present, opens with respiration).                           - Gastritis. Biopsied.                           - Normal examined duodenum. Recommendation:           - Patient has a contact number available for                            emergencies. The signs and symptoms of potential                            delayed complications were discussed with  the                            patient. Return to normal activities tomorrow.                            Written discharge instructions were provided to the                            patient.                           - Resume previous diet.                           - Continue present medications.                           -  Await pathology results.                           - No high dose aspirin, ibuprofen, naproxen, or                            other non-steroidal anti-inflammatory drugs.                           - Use Prilosec (omeprazole) 40 mg PO daily for 3                            months. Mauri Pole, MD 01/26/2021 3:51:20 PM This report has been signed electronically.

## 2021-01-26 NOTE — Progress Notes (Signed)
Pt's states no medical or surgical changes since previsit or office visit. 

## 2021-01-26 NOTE — Op Note (Signed)
[facility] Patient Name: Patricia Stevenson Procedure Date: 01/26/2021 3:17 PM MRN: [id number] Endoscopist: Mauri Pole , MD Age: 60 Referring MD:  Date of Birth: 1960-12-29 Gender: Female Account #: [id number] Procedure:                Colonoscopy Indications:              Screening for colorectal malignant neoplasm Medicines:                Monitored Anesthesia Care Procedure:                Pre-Anesthesia Assessment:                           - Prior to the procedure, a History and Physical                            was performed, and patient medications and                            allergies were reviewed. The patient's tolerance of                            previous anesthesia was also reviewed. The risks                            and benefits of the procedure and the sedation                            options and risks were discussed with the patient.                            All questions were answered, and informed consent                            was obtained. Prior Anticoagulants: The patient has                            taken no previous anticoagulant or antiplatelet                            agents. ASA Grade Assessment: II - A patient with                            mild systemic disease. After reviewing the risks                            and benefits, the patient was deemed in                            satisfactory condition to undergo the procedure.                           After obtaining informed consent, the colonoscope  was passed under direct vision. Throughout the                            procedure, the patient's blood pressure, pulse, and                            oxygen saturations were monitored continuously. The                            Olympus PFC-[id number]) Colonoscope was                            introduced through the anus and advanced to the the                            cecum,  identified by appendiceal orifice and                            ileocecal valve. The colonoscopy was performed                            without difficulty. The patient tolerated the                            procedure well. The quality of the bowel                            preparation was excellent. The ileocecal valve,                            appendiceal orifice, and rectum were photographed. Scope In: 3:31:27 PM Scope Out: 3:45:53 PM Scope Withdrawal Time: 0 hours 6 minutes 59 seconds  Total Procedure Duration: 0 hours 14 minutes 26 seconds  Findings:                 The perianal and digital rectal examinations were                            normal.                           A few small-mouthed diverticula were found in the                            sigmoid colon and descending colon.                           Non-bleeding internal hemorrhoids were found during                            retroflexion. The hemorrhoids were small.                           The exam was otherwise without abnormality. Complications:            No immediate complications. Estimated  Blood Loss:     Estimated blood loss: none. Impression:               - Diverticulosis in the sigmoid colon and in the                            descending colon.                           - Non-bleeding internal hemorrhoids.                           - The examination was otherwise normal.                           - No specimens collected. Recommendation:           - Patient has a contact number available for                            emergencies. The signs and symptoms of potential                            delayed complications were discussed with the                            patient. Return to normal activities tomorrow.                            Written discharge instructions were provided to the                            patient.                           - Resume previous diet.                            - Continue present medications.                           - Repeat colonoscopy in 10 years for screening                            purposes. Mauri Pole, MD 01/26/2021 3:53:10 PM This report has been signed electronically.

## 2021-01-26 NOTE — Progress Notes (Signed)
pt tolerated well. VSS. awake and to recovery. Report given to RN. Bite block left insitu to recovery. 

## 2021-01-26 NOTE — Patient Instructions (Signed)
Information on gastritis, diverticulosis ans hemorrhoids given to you today.  Await pathology results.   Avoid NSAIDS (Aspirin, Ibuprofen, Aleve, Naproxen), you may use Tylenol as needed.  Use Prilosec (omeprazole ) 40 mg by mouth each day for 3 months.  Resume previous diet and medications.  YOU HAD AN ENDOSCOPIC PROCEDURE TODAY AT [facility]:   Refer to the procedure report that was given to you for any specific questions about what was found during the examination.  If the procedure report does not answer your questions, please call your gastroenterologist to clarify.  If you requested that your care partner not be given the details of your procedure findings, then the procedure report has been included in a sealed envelope for you to review at your convenience later.  YOU SHOULD EXPECT: Some feelings of bloating in the abdomen. Passage of more gas than usual.  Walking can help get rid of the air that was put into your GI tract during the procedure and reduce the bloating. If you had a lower endoscopy (such as a colonoscopy or flexible sigmoidoscopy) you may notice spotting of blood in your stool or on the toilet paper. If you underwent a bowel prep for your procedure, you may not have a normal bowel movement for a few days.  Please Note:  You might notice some irritation and congestion in your nose or some drainage.  This is from the oxygen used during your procedure.  There is no need for concern and it should clear up in a day or so.  SYMPTOMS TO REPORT IMMEDIATELY:   Following lower endoscopy (colonoscopy or flexible sigmoidoscopy):  Excessive amounts of blood in the stool  Significant tenderness or worsening of abdominal pains  Swelling of the abdomen that is new, acute  Fever of 100F or higher   Following upper endoscopy (EGD)  Vomiting of blood or coffee ground material  New chest pain or pain under the shoulder blades  Painful or persistently difficult  swallowing  New shortness of breath  Fever of 100F or higher  Black, tarry-looking stools  For urgent or emergent issues, a gastroenterologist can be reached at any hour by calling [phone]. Do not use MyChart messaging for urgent concerns.    DIET:  We do recommend a small meal at first, but then you may proceed to your regular diet.  Drink plenty of fluids but you should avoid alcoholic beverages for 24 hours.  ACTIVITY:  You should plan to take it easy for the rest of today and you should NOT DRIVE or use heavy machinery until tomorrow (because of the sedation medicines used during the test).    FOLLOW UP: Our staff will call the number listed on your records 48-72 hours following your procedure to check on you and address any questions or concerns that you may have regarding the information given to you following your procedure. If we do not reach you, we will leave a message.  We will attempt to reach you two times.  During this call, we will ask if you have developed any symptoms of COVID 19. If you develop any symptoms (ie: fever, flu-like symptoms, shortness of breath, cough etc.) before then, please call [phone].  If you test positive for Covid 19 in the 2 weeks post procedure, please call and report this information to [place].    If any biopsies were taken you will be contacted by phone or by letter within the next 1-3 weeks.  Please call  us at [phone] if you have not heard about the biopsies in 3 weeks.    SIGNATURES/CONFIDENTIALITY: You and/or your care partner have signed paperwork which will be entered into your electronic medical record.  These signatures attest to the fact that that the information above on your After Visit Summary has been reviewed and is understood.  Full responsibility of the confidentiality of this discharge information lies with you and/or your care-partner.

## 2021-01-28 ENCOUNTER — Telehealth: Payer: Self-pay | Admitting: *Deleted

## 2021-01-28 ENCOUNTER — Telehealth: Payer: Self-pay | Admitting: Physician Assistant

## 2021-01-28 NOTE — Telephone Encounter (Signed)
Pt states that she is having some generalized stomach pain after eating. She states that she had this pain before procedure and would like some advise on what to do.

## 2021-01-28 NOTE — Telephone Encounter (Signed)
  Follow up Call-  Call back number 01/26/2021  Post procedure Call Back phone  # [phone]  Permission to leave phone message Yes  Some recent data might be hidden     Patient questions:  Do you have a fever, pain , or abdominal swelling? No. Pain Score  0 *  Have you tolerated food without any problems? Yes.    Have you been able to return to your normal activities? Yes.    Do you have any questions about your discharge instructions: Diet   No. Medications  No. Follow up visit  No.  Do you have questions or concerns about your Care? No.  Actions: * If pain score is 4 or above: 1. No action needed, pain <4.Have you developed a fever since your procedure? no  2.   Have you had an respiratory symptoms (SOB or cough) since your procedure? no  3.   Have you tested positive for COVID 19 since your procedure no  4.   Have you had any family members/close contacts diagnosed with the COVID 19 since your procedure?  no   If yes to any of these questions please route to Joylene John, RN and Joella Prince, RN

## 2021-01-29 ENCOUNTER — Other Ambulatory Visit: Payer: Self-pay

## 2021-01-29 DIAGNOSIS — K30 Functional dyspepsia: Secondary | ICD-10-CM

## 2021-01-29 DIAGNOSIS — R1013 Epigastric pain: Secondary | ICD-10-CM

## 2021-01-29 NOTE — Telephone Encounter (Signed)
Await biopsy results from EGD.  Continue daily PPI.  Use Gas-X 1 capsule after meals as needed.  Please schedule right upper quadrant abdominal ultrasound to exclude gallbladder disease

## 2021-01-29 NOTE — Telephone Encounter (Signed)
Spoke with the patient using [employer] [phone]. Patient instructed to use the Gas-x and when to take it. Agrees to the abd.u/s. Understands she will be contacted by radiology to schedule. Questions invited and answered.

## 2021-01-29 NOTE — Telephone Encounter (Signed)
Spoke with the patient using [employer] Roselie Awkward [id number]) She is taking Omeprazole 40 mg every day at 6:30 am and then eating at 7:00 to 7:30 am. She begins having pain after she eats. Her pain is unchanged from when she came to see [place]. She is having daily bowel movements. The pain is her upper to lower abdomen. No vomiting. Gas pressure that is difficult to relieve at her work because she cannot position herself as she does in her home to relieve the pain. (See the office note about her "exercises")  EGD 01/26/21. Bx results are not back. Please advise.

## 2021-02-02 ENCOUNTER — Other Ambulatory Visit: Payer: Self-pay

## 2021-02-02 ENCOUNTER — Encounter: Payer: Self-pay | Admitting: Gastroenterology

## 2021-02-02 ENCOUNTER — Ambulatory Visit (HOSPITAL_BASED_OUTPATIENT_CLINIC_OR_DEPARTMENT_OTHER)
Admission: RE | Admit: 2021-02-02 | Discharge: 2021-02-02 | Disposition: A | Discharge status: Home / Self Care | Payer: Self-pay | Source: Ambulatory Visit | Attending: Gastroenterology | Admitting: Gastroenterology

## 2021-02-02 DIAGNOSIS — K30 Functional dyspepsia: Secondary | ICD-10-CM | POA: Insufficient documentation

## 2021-02-02 DIAGNOSIS — R1013 Epigastric pain: Secondary | ICD-10-CM | POA: Insufficient documentation

## 2021-02-02 IMAGING — US US ABDOMEN LIMITED
1 series · 14 of 25 positions shown · non-contrast
Comparison: None.

CLINICAL DATA: Indigestion and epigastric pain for 8 months.

EXAM:
ULTRASOUND ABDOMEN LIMITED RIGHT UPPER QUADRANT

[Series 1: us abdomen limited ruq (liver/gb) · 14 of 41 slices shown]
[im 1/41]
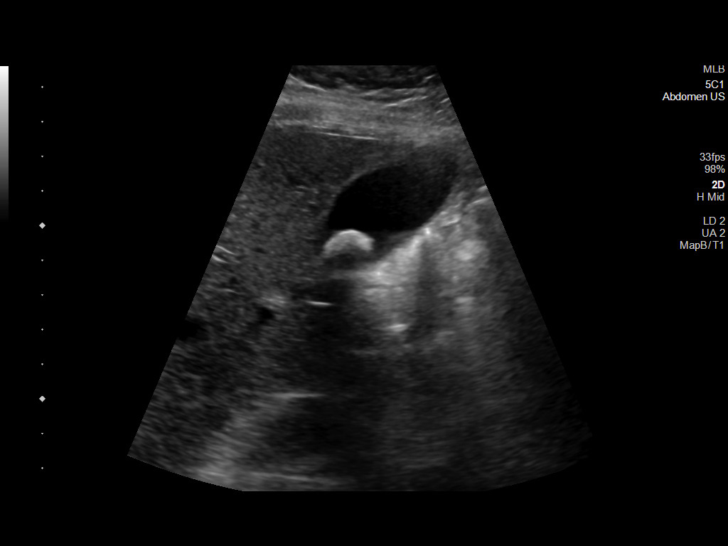
[im 4/41]
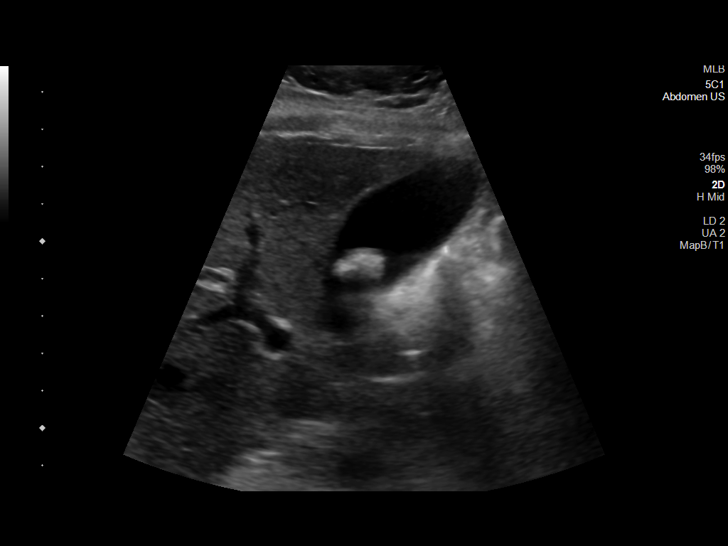
[im 7/41]
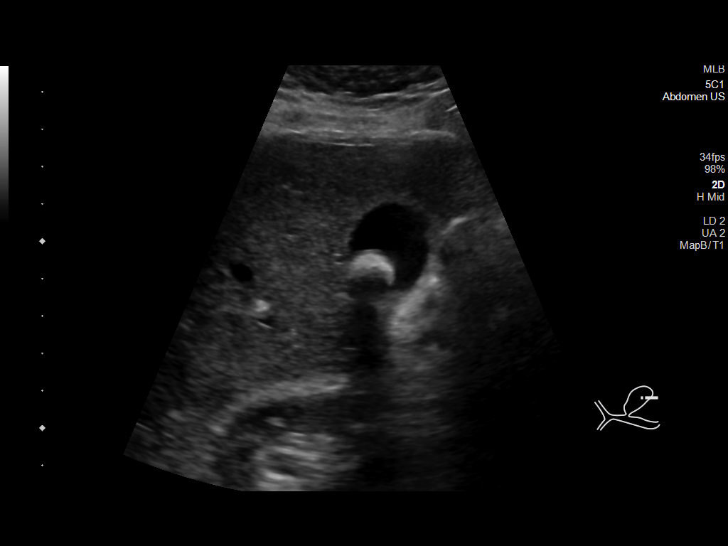
[im 11/41]
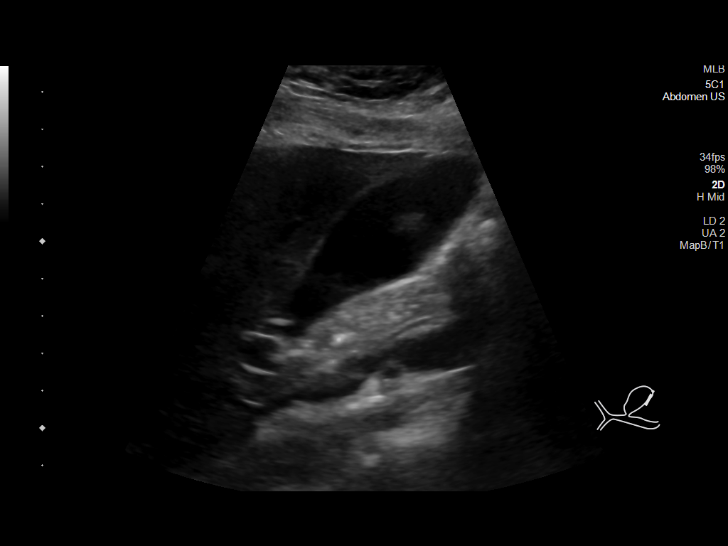
[im 14/41]
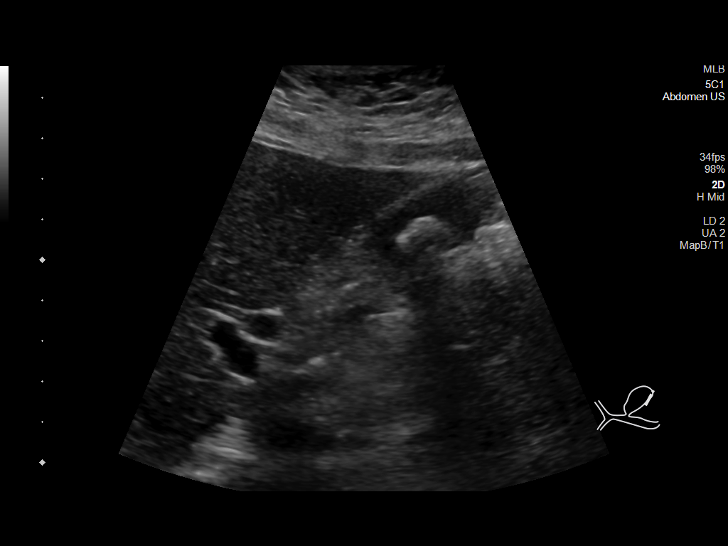
[im 16/41]
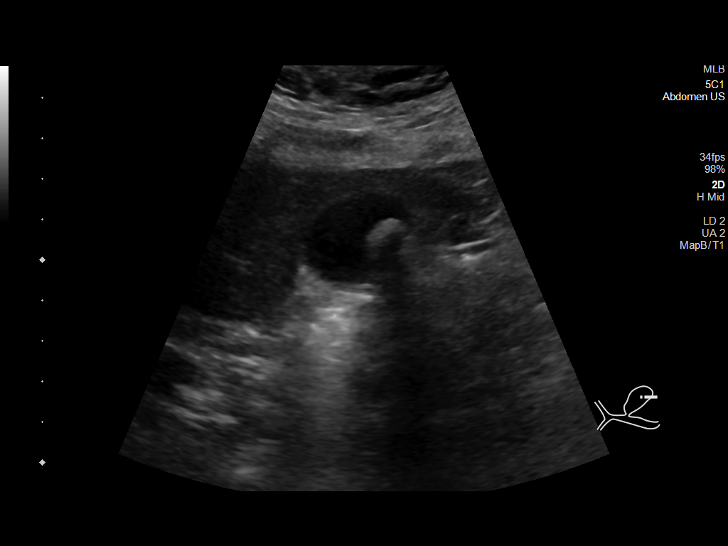
[im 19/41]
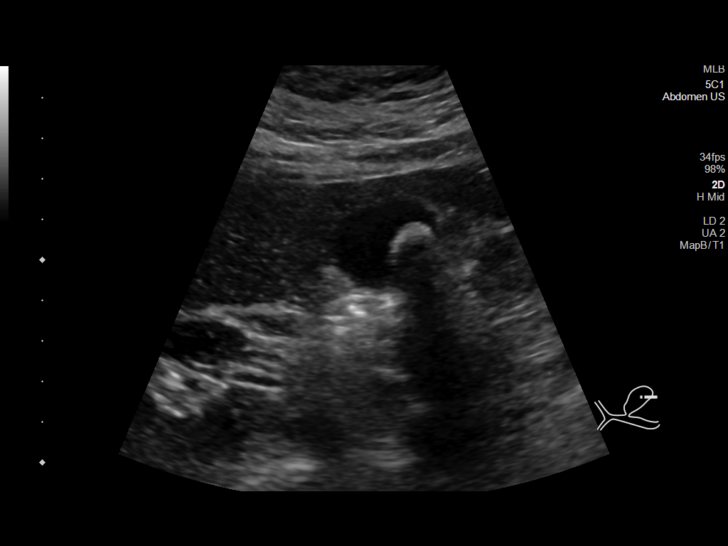
[im 22/41]
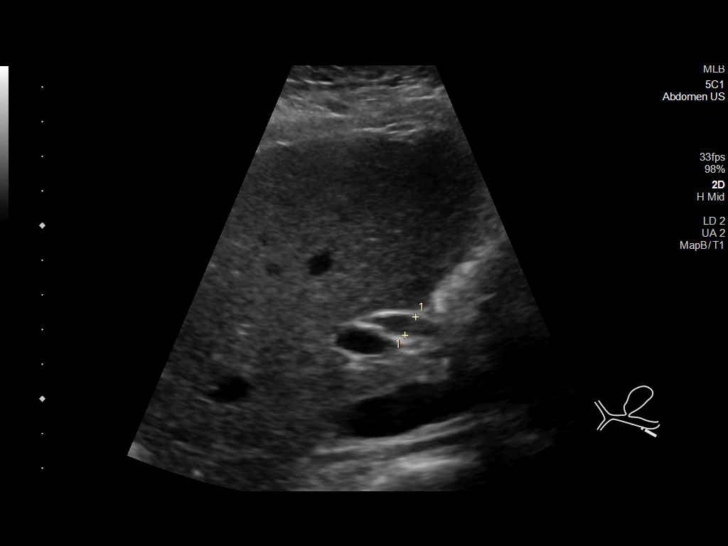
[im 26/41]
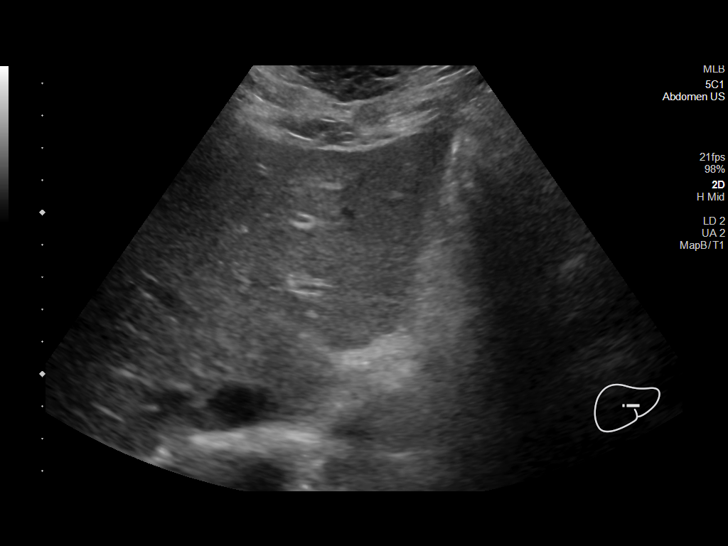
[im 27/41]
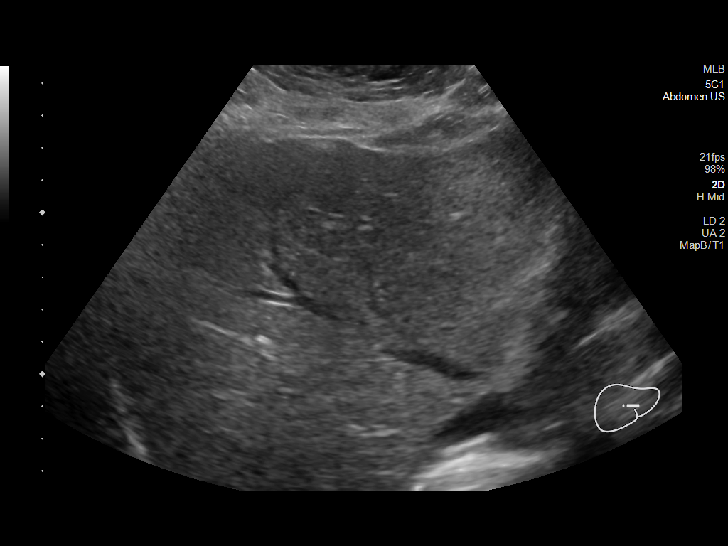
[im 31/41]
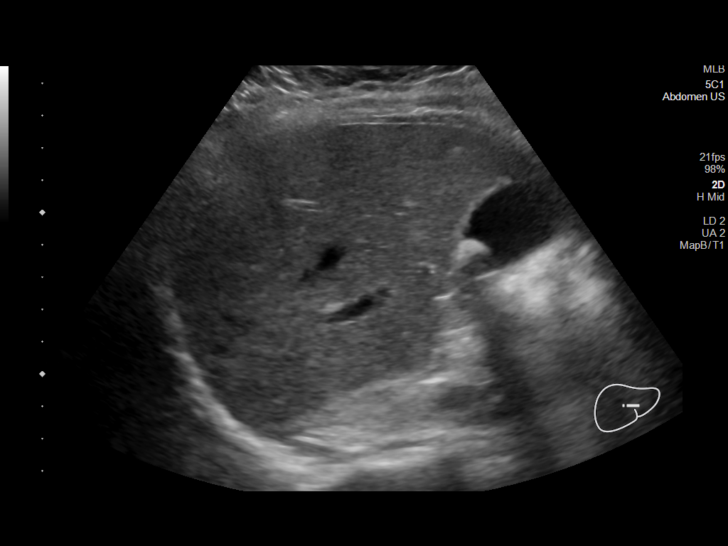
[im 34/41]
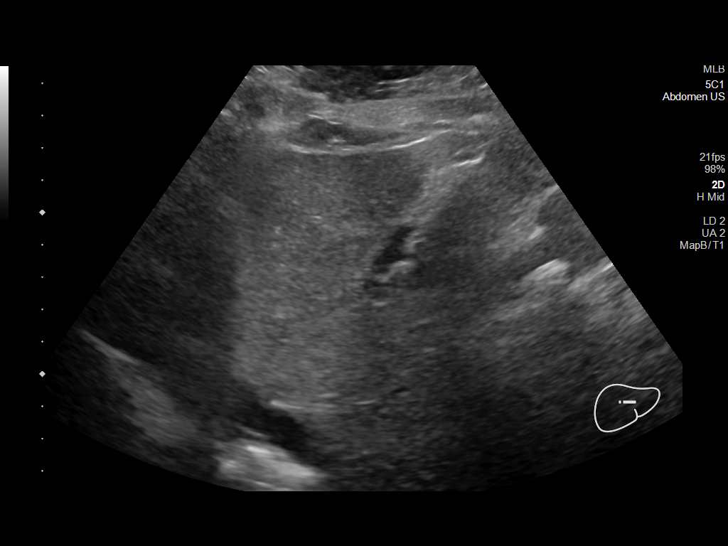
[im 37/41]
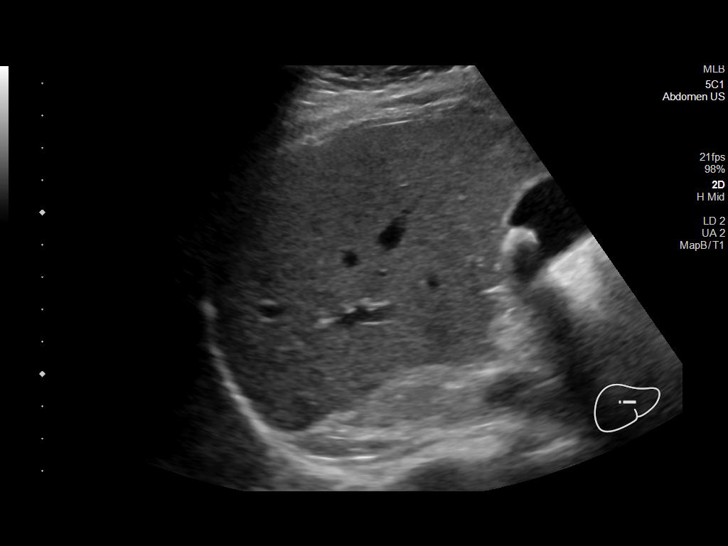
[im 41/41]
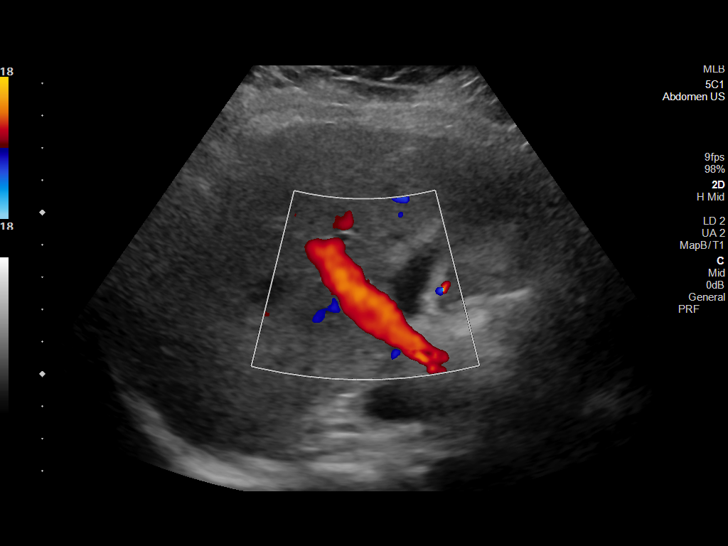

[14 of 25 positions shown; findings below may reference images not displayed]

FINDINGS: Gallbladder:

Cholelithiasis with single mobile stone measuring 1.4 cm diameter.
No wall thickening or edema.

Common bile duct:

Diameter: 6 mm, normal

Liver:

No focal lesion identified. Within normal limits in parenchymal
echogenicity. Portal vein is patent on color Doppler imaging with
normal direction of blood flow towards the liver.

Other: None.
IMPRESSION: Cholelithiasis without evidence of acute cholecystitis.

## 2021-02-10 ENCOUNTER — Ambulatory Visit (HOSPITAL_BASED_OUTPATIENT_CLINIC_OR_DEPARTMENT_OTHER): Payer: Self-pay

## 2021-02-10 ENCOUNTER — Telehealth: Payer: Self-pay | Admitting: Internal Medicine

## 2021-02-10 ENCOUNTER — Encounter: Payer: Self-pay | Admitting: Gastroenterology

## 2021-02-10 NOTE — Telephone Encounter (Signed)
Spoke with patient and lab appointment scheduled

## 2021-02-10 NOTE — Telephone Encounter (Signed)
Pt is calling in wanting to know if she should continue to take the Vitamin D and if so she will need a refill on the Rx and also to see is she needs to do any blood work.  Pharm:  [facility] in [place].   Pt would like to have a call back.

## 2021-02-11 ENCOUNTER — Other Ambulatory Visit (INDEPENDENT_AMBULATORY_CARE_PROVIDER_SITE_OTHER): Payer: Self-pay

## 2021-02-11 ENCOUNTER — Other Ambulatory Visit: Payer: Self-pay

## 2021-02-11 DIAGNOSIS — E559 Vitamin D deficiency, unspecified: Secondary | ICD-10-CM

## 2021-02-11 DIAGNOSIS — Z1231 Encounter for screening mammogram for malignant neoplasm of breast: Secondary | ICD-10-CM

## 2021-02-11 LAB — VITAMIN D 25 HYDROXY (VIT D DEFICIENCY, FRACTURES): VITD: 58.81 ng/mL (ref 30.00–100.00)

## 2021-02-25 ENCOUNTER — Telehealth: Payer: Self-pay | Admitting: Internal Medicine

## 2021-02-25 NOTE — Telephone Encounter (Signed)
Patient states that Dr. Jerilee Hoh told her to purchase vitamin D from the pharmacy.  Patient called back needing clarification about which vitamin D to buy from the pharmacy because there are multiple versions.  Patient's contact number is [phone].  Please advise.

## 2021-02-27 NOTE — Telephone Encounter (Signed)
Patient is aware 

## 2021-03-13 ENCOUNTER — Ambulatory Visit
Admission: RE | Admit: 2021-03-13 | Discharge: 2021-03-13 | Disposition: A | Discharge status: Home / Self Care | Payer: No Typology Code available for payment source | Source: Ambulatory Visit | Attending: Internal Medicine | Admitting: Internal Medicine

## 2021-03-13 ENCOUNTER — Other Ambulatory Visit: Payer: Self-pay

## 2021-03-13 IMAGING — MG MM DIGITAL SCREENING BILAT W/ TOMO AND CAD
8 series · 8 of 24 positions shown · non-contrast
Comparison: Previous exam(s).

CLINICAL DATA: Screening.

EXAM:
DIGITAL SCREENING BILATERAL MAMMOGRAM WITH TOMOSYNTHESIS AND CAD
TECHNIQUE: Bilateral screening digital craniocaudal and mediolateral oblique
mammograms were obtained. Bilateral screening digital breast
tomosynthesis was performed. The images were evaluated with
computer-aided detection.

[L CC synth-2D]
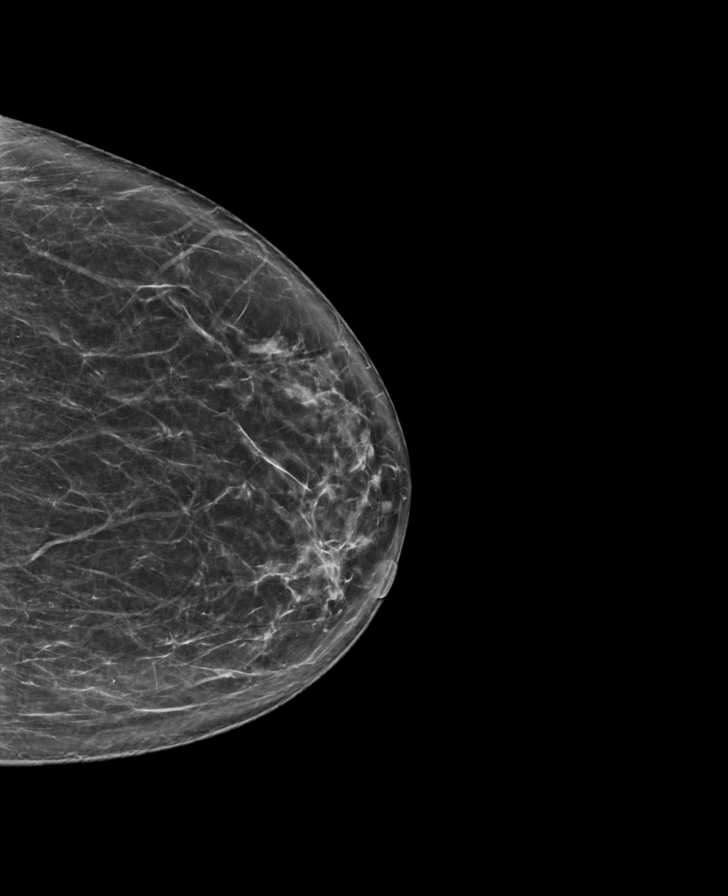

[R MLO synth-2D]
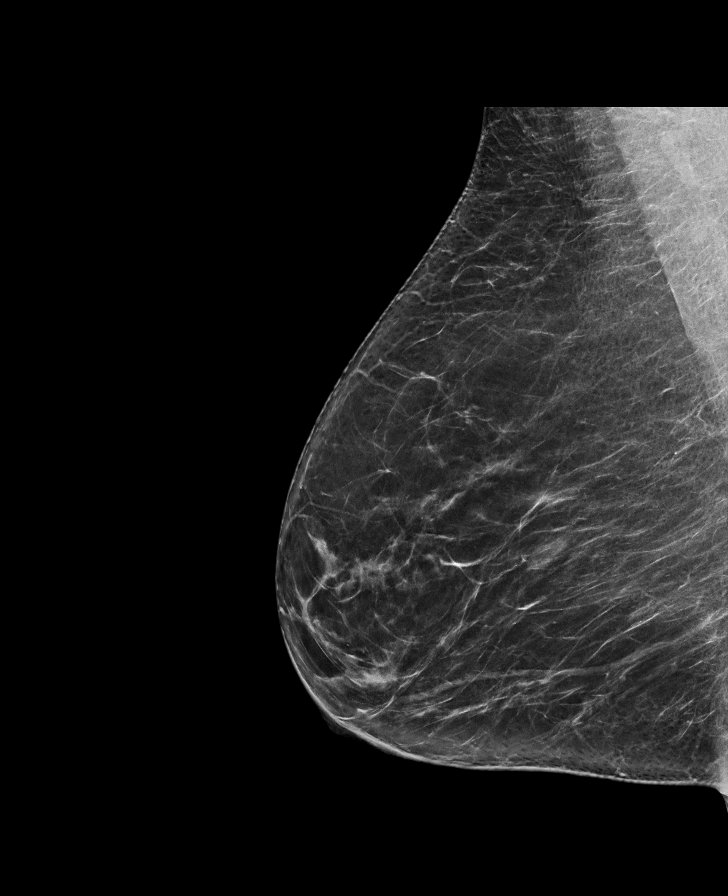

[R CC synth-2D]
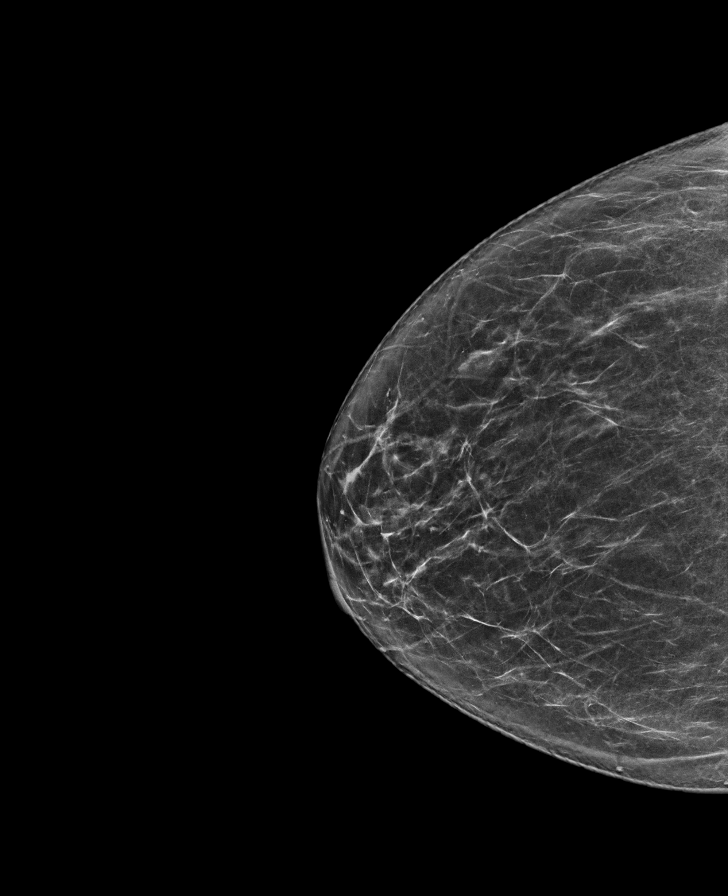

[L MLO synth-2D]
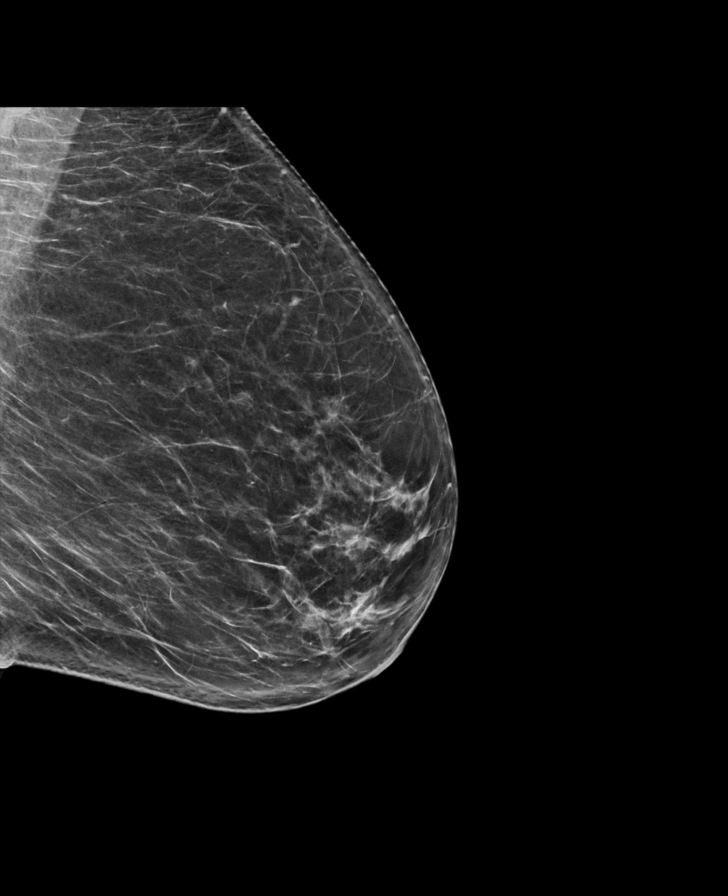

[R MLO tomo · tomo slice 38/75.0]
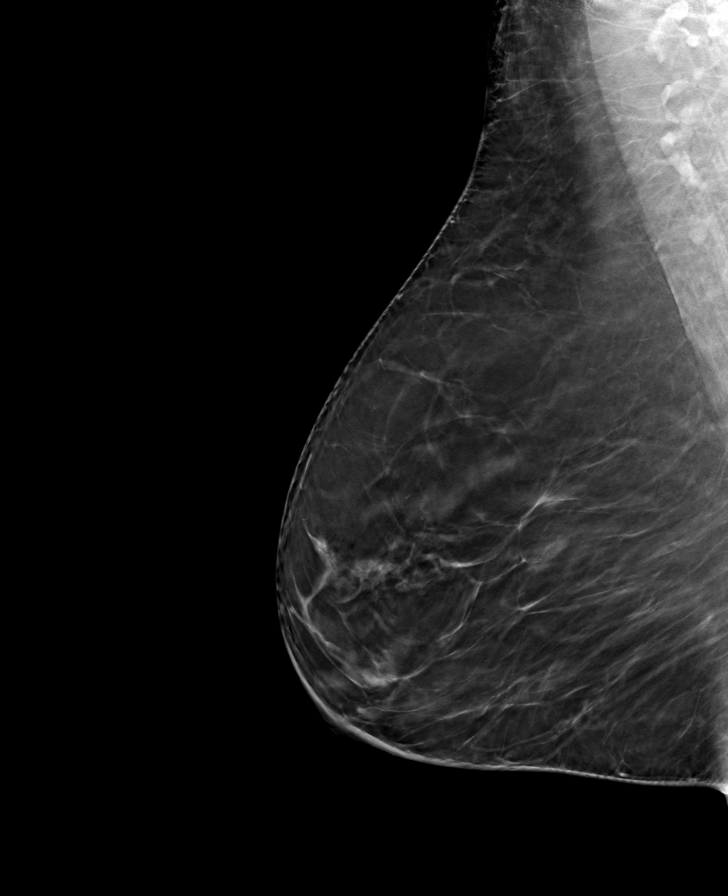

[R CC tomo · tomo slice 36/71.0]
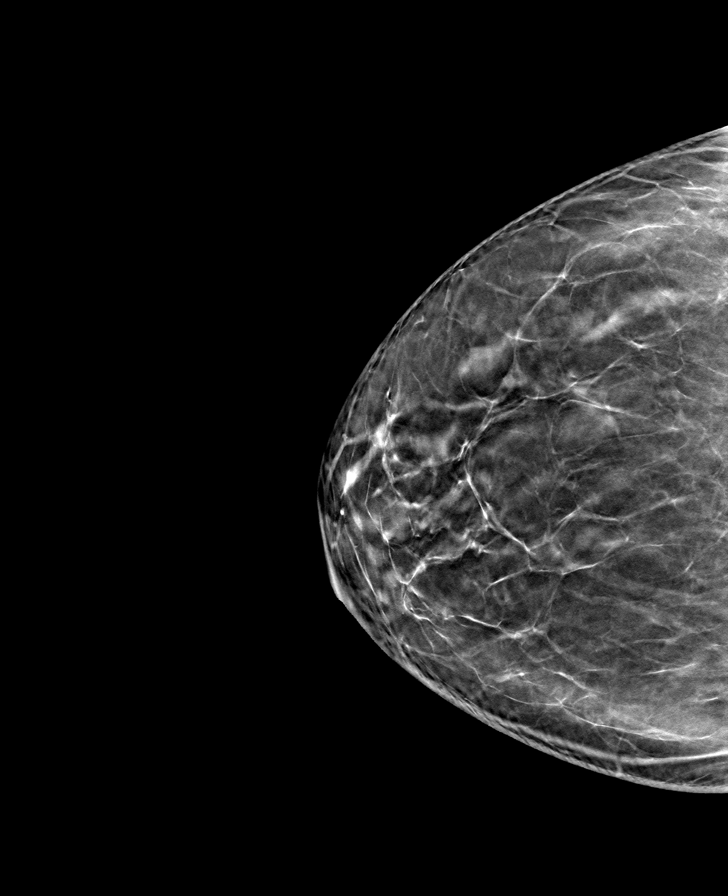

[L MLO tomo · tomo slice 37/72.0]
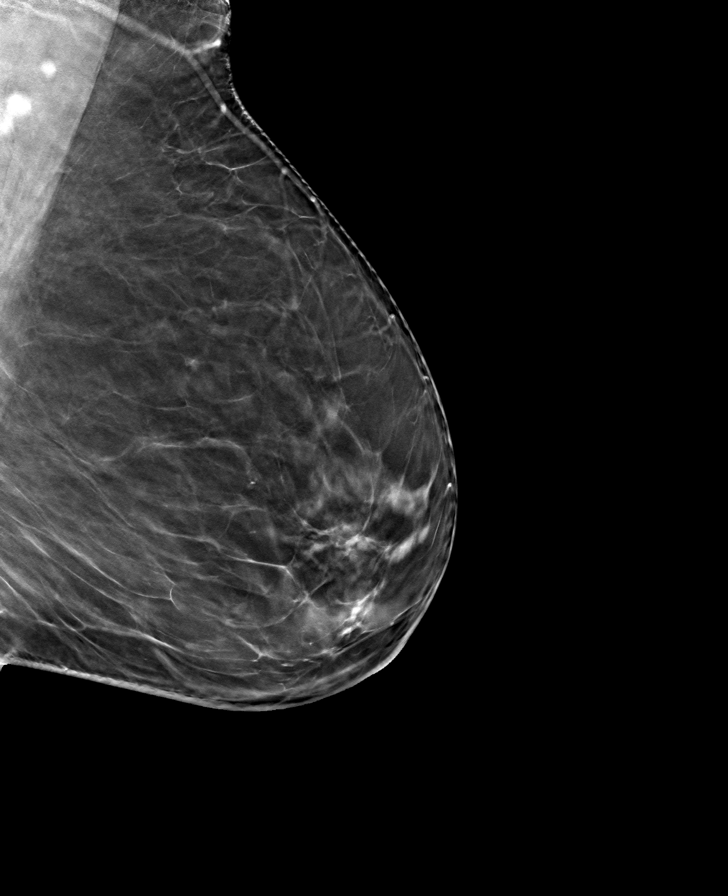

[L CC tomo · tomo slice 38/75.0]
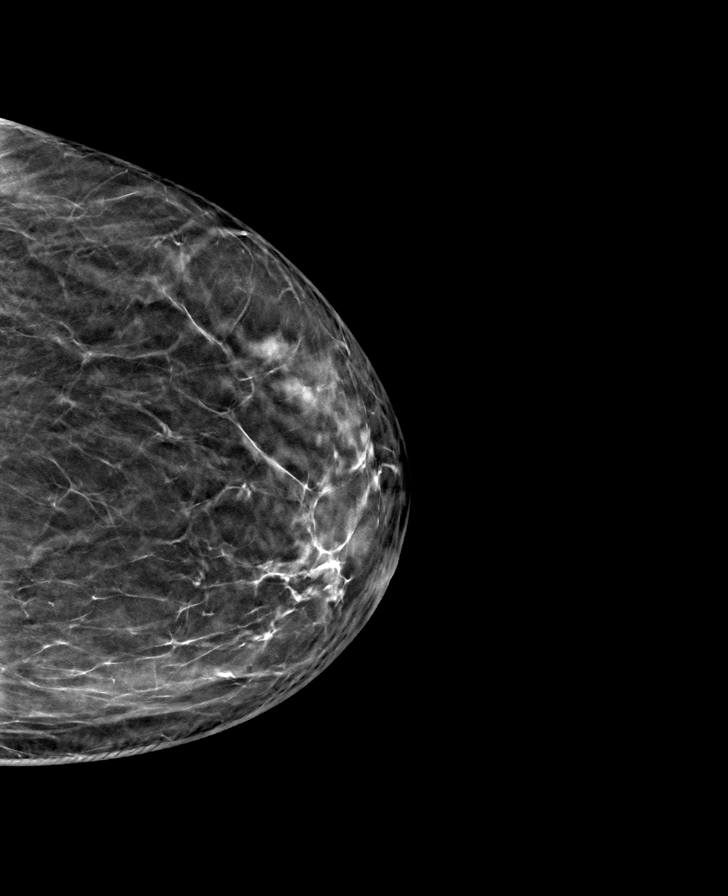

[8 of 24 positions shown; findings below may reference images not displayed]

ACR Breast Density Category b: There are scattered areas of
fibroglandular density.
FINDINGS: There are no findings suspicious for malignancy.
IMPRESSION: No mammographic evidence of malignancy. A result letter of this
screening mammogram will be mailed directly to the patient.

RECOMMENDATION:
Screening mammogram in one year. (Code:[BY])

BI-RADS CATEGORY  1: Negative.

## 2021-03-23 ENCOUNTER — Encounter: Payer: Self-pay | Admitting: Gastroenterology

## 2021-03-24 ENCOUNTER — Encounter: Payer: Self-pay | Admitting: Internal Medicine

## 2021-03-24 ENCOUNTER — Ambulatory Visit (INDEPENDENT_AMBULATORY_CARE_PROVIDER_SITE_OTHER): Payer: Self-pay | Admitting: Internal Medicine

## 2021-03-24 ENCOUNTER — Other Ambulatory Visit: Payer: Self-pay

## 2021-03-24 ENCOUNTER — Ambulatory Visit: Payer: Self-pay

## 2021-03-24 VITALS — BP 140/80 | HR 69 | Temp 99.4°F | Wt 124.4 lb

## 2021-03-24 DIAGNOSIS — K8020 Calculus of gallbladder without cholecystitis without obstruction: Secondary | ICD-10-CM

## 2021-03-24 DIAGNOSIS — Z23 Encounter for immunization: Secondary | ICD-10-CM

## 2021-03-24 DIAGNOSIS — F419 Anxiety disorder, unspecified: Secondary | ICD-10-CM

## 2021-03-24 DIAGNOSIS — E785 Hyperlipidemia, unspecified: Secondary | ICD-10-CM

## 2021-03-24 NOTE — Addendum Note (Signed)
Addended by: Westley Hummer B on: 03/24/2021 05:07 PM   Modules accepted: Orders

## 2021-03-24 NOTE — Progress Notes (Signed)
Established Patient Office Visit     This visit occurred during the SARS-CoV-2 public health emergency.  Safety protocols were in place, including screening questions prior to the visit, additional usage of staff PPE, and extensive cleaning of exam room while observing appropriate contact time as indicated for disinfecting solutions.    CC/Reason for Visit: Follow-up chronic conditions  HPI: Patricia Stevenson is a 60 y.o. female who is coming in today for the above mentioned reasons.  She is here mainly to update me on her care.  She was sent to GI for an endoscopy due to what was thought to be dyspeptic symptoms.  She had an EGD without significant findings.  She continues to take omeprazole.  She was then sent for right upper quadrant ultrasound and was found to have cholelithiasis.  She tells me she has a cholecystectomy scheduled for July 23.  She is now having significant anxiety type symptoms because of her physical illness She is afraid to leave the house by herself.  This has become noticeable to her family.  Past Medical/Surgical History: Past Medical History:  Diagnosis Date   Hypertension     Past Surgical History:  Procedure Laterality Date   BREAST BIOPSY Left    20 years ago in [place]- benign- not sure if bx or cyst    Social History:  reports that she has never smoked. She has never used smokeless tobacco. She reports that she does not drink alcohol and does not use drugs.  Allergies: No Known Allergies  Family History:  Family History  Problem Relation Age of Onset   Breast cancer Neg Hx      Current Outpatient Medications:    calcium carbonate (OSCAL) 1500 (600 Ca) MG TABS tablet, Take by mouth 2 (two) times daily with a meal., Disp: , Rfl:    omeprazole (PRILOSEC) 40 MG capsule, Take 1 capsule (40 mg total) by mouth daily., Disp: 90 capsule, Rfl: 0  Review of Systems:  Constitutional: Denies fever, chills, diaphoresis, appetite change and fatigue.   HEENT: Denies photophobia, eye pain, redness, hearing loss, ear pain, congestion, sore throat, rhinorrhea, sneezing, mouth sores, trouble swallowing, neck pain, neck stiffness and tinnitus.   Respiratory: Denies SOB, DOE, cough, chest tightness,  and wheezing.   Cardiovascular: Denies chest pain, palpitations and leg swelling.  Gastrointestinal: Denies nausea, vomiting, abdominal pain, diarrhea, constipation, blood in stool and abdominal distention.  Genitourinary: Denies dysuria, urgency, frequency, hematuria, flank pain and difficulty urinating.  Endocrine: Denies: hot or cold intolerance, sweats, changes in hair or nails, polyuria, polydipsia. Musculoskeletal: Denies myalgias, back pain, joint swelling, arthralgias and gait problem.  Skin: Denies pallor, rash and wound.  Neurological: Denies dizziness, seizures, syncope, weakness, light-headedness, numbness and headaches.  Hematological: Denies adenopathy. Easy bruising, personal or family bleeding history  Psychiatric/Behavioral: Denies suicidal ideation,  nervousness, sleep disturbance and agitation    Physical Exam: Vitals:   03/24/21 1500  BP: 140/80  Pulse: 69  Temp: 99.4 F (37.4 C)  TempSrc: Oral  SpO2: 98%  Weight: 124 lb 6.4 oz (56.4 kg)    Body mass index is 25.13 kg/m.   Constitutional: NAD, calm, comfortable Eyes: PERRL, lids and conjunctivae normal ENMT: Mucous membranes are moist.  Respiratory: clear to auscultation bilaterally, no wheezing, no crackles. Normal respiratory effort. No accessory muscle use.  Cardiovascular: Regular rate and rhythm, no murmurs / rubs / gallops. No extremity edema.  Neurologic: Grossly intact and nonfocal Psychiatric: Normal judgment and insight.  Alert and oriented x 3. Normal mood.    Impression and Plan:  Calculus of gallbladder without cholecystitis without obstruction -Scheduled for laparoscopic cholecystectomy by Dr. Ralene Ok later this month.  Anxiety -This is  a new diagnosis. -I have advised that we wait till after surgery as I suspect most of this may resolve once her pain does. -If she continues to have significant anxiety symptoms 6 weeks out from surgery, we can consider CBT sessions and medication.  Hyperlipidemia, unspecified hyperlipidemia type -Last lipid panel in March 2022 with a total cholesterol of 176, triglycerides 98 and LDL 115. -She is managing with lifestyle changes.  Need for shingles vaccine -Second shingles vaccine today.  Time spent: 33 minutes reviewing chart, interviewing and examining patient and formulating plan of care.      Lelon Frohlich, MD [facility] at [facility]

## 2021-04-14 ENCOUNTER — Other Ambulatory Visit: Payer: Self-pay | Admitting: General Surgery

## 2021-04-28 ENCOUNTER — Telehealth: Payer: Self-pay | Admitting: Gastroenterology

## 2021-04-28 NOTE — Telephone Encounter (Signed)
Patient callng requesting refill for med Prilosec.Marland Kitchen Plz advise thank you

## 2021-05-08 ENCOUNTER — Telehealth: Payer: Self-pay | Admitting: Gastroenterology

## 2021-05-08 ENCOUNTER — Other Ambulatory Visit: Payer: Self-pay

## 2021-05-08 ENCOUNTER — Ambulatory Visit: Payer: No Typology Code available for payment source | Admitting: Internal Medicine

## 2021-05-08 ENCOUNTER — Ambulatory Visit (INDEPENDENT_AMBULATORY_CARE_PROVIDER_SITE_OTHER): Payer: Self-pay | Admitting: Internal Medicine

## 2021-05-08 ENCOUNTER — Encounter: Payer: Self-pay | Admitting: Internal Medicine

## 2021-05-08 VITALS — BP 120/80 | HR 74 | Temp 98.3°F | Wt 121.8 lb

## 2021-05-08 DIAGNOSIS — R739 Hyperglycemia, unspecified: Secondary | ICD-10-CM

## 2021-05-08 DIAGNOSIS — R112 Nausea with vomiting, unspecified: Secondary | ICD-10-CM

## 2021-05-08 DIAGNOSIS — E162 Hypoglycemia, unspecified: Secondary | ICD-10-CM

## 2021-05-08 LAB — POCT GLYCOSYLATED HEMOGLOBIN (HGB A1C): Hemoglobin A1C: 5.5 % (ref 4.0–5.6)

## 2021-05-08 NOTE — Telephone Encounter (Signed)
Spoke with patient, advised that per EGD report from 01/2021 patient is to continue for 3 months. Advised that patient does not need to continue if she has completed all of the medication. Pt verbalized understanding and had no concerns at the end of the call.

## 2021-05-08 NOTE — Progress Notes (Signed)
Established Patient Office Visit     This visit occurred during the SARS-CoV-2 public health emergency.  Safety protocols were in place, including screening questions prior to the visit, additional usage of staff PPE, and extensive cleaning of exam room while observing appropriate contact time as indicated for disinfecting solutions.    CC/Reason for Visit: Discuss results of continuous glucose monitoring.  HPI: Patricia Stevenson is a 60 y.o. female who is coming in today for the above mentioned reasons. Past Medical History is significant for: Hyperlipidemia and vitamin D deficiency.  She had cholecystectomy recently for cholelithiasis with biliary colic.  She has been on omeprazole for presumed GERD.  She had contacted me last month and described episodes of what she thinks is extreme anxiety with hand shaking, nausea, vomiting at times, severe stomach pain.  She tells me that eating usually relieves these issues.  She tells me that this has been happening for years.  Previously she has had episodes of syncope with this but none recently.  I was concerned for hypoglycemia so I gave her a sample of a freestyle libre that was available in the office and she is here today to discuss these results.  Upon review, she has significant hypoglycemic episodes.  1 day she spent over 75% of the day hypoglycemic.  Her lows are in the 7s and 60s.  She frequently has early morning hypoglycemia between 2 and 4 AM.  She tells me that she has also been urinating a large amount of very diluted urine.  She has lost some weight recently, she admits to not eating a lot but she does have some snacks every few hours.  She is eating some grapes during her visit today.   Past Medical/Surgical History: Past Medical History:  Diagnosis Date   Hypertension     Past Surgical History:  Procedure Laterality Date   BREAST BIOPSY Left    20 years ago in [place]- benign- not sure if bx or cyst    Social History:   reports that she has never smoked. She has never used smokeless tobacco. She reports that she does not drink alcohol and does not use drugs.  Allergies: No Known Allergies  Family History:  Family History  Problem Relation Age of Onset   Breast cancer Neg Hx      Current Outpatient Medications:    calcium carbonate (OSCAL) 1500 (600 Ca) MG TABS tablet, Take by mouth 2 (two) times daily with a meal., Disp: , Rfl:    omeprazole (PRILOSEC) 40 MG capsule, Take 1 capsule (40 mg total) by mouth daily., Disp: 90 capsule, Rfl: 0  Review of Systems:  Constitutional: Denies fever, chills, diaphoresis. HEENT: Denies photophobia, eye pain, redness, hearing loss, ear pain, congestion, sore throat, rhinorrhea, sneezing, mouth sores, trouble swallowing, neck pain, neck stiffness and tinnitus.   Respiratory: Denies SOB, DOE, cough, chest tightness,  and wheezing.   Cardiovascular: Denies chest pain, palpitations and leg swelling.  Gastrointestinal: Denies diarrhea, constipation, blood in stool. Genitourinary: Denies dysuria, urgency, frequency, hematuria, flank pain and difficulty urinating.  Endocrine: Denies: hot or cold intolerance, sweats, changes in hair or nails, polyuria, polydipsia. Musculoskeletal: Denies myalgias, back pain, joint swelling, arthralgias and gait problem.  Skin: Denies pallor, rash and wound.  Neurological: Denies dizziness, seizures, numbness and headaches.  Hematological: Denies adenopathy. Easy bruising, personal or family bleeding history  Psychiatric/Behavioral: Denies suicidal ideation, mood changes, confusion, nervousness, sleep disturbance and agitation    Physical Exam:  Vitals:   05/08/21 1040  BP: 120/80  Pulse: 74  Temp: 98.3 F (36.8 C)  SpO2: 99%  Weight: 121 lb 12.8 oz (55.2 kg)    Body mass index is 24.6 kg/m.   Constitutional: NAD, calm, comfortable Eyes: PERRL, lids and conjunctivae normal ENMT: Mucous membranes are moist.  Respiratory:  clear to auscultation bilaterally, no wheezing, no crackles. Normal respiratory effort. No accessory muscle use.  Cardiovascular: Regular rate and rhythm, no murmurs / rubs / gallops. No extremity edema.  Neurologic: Grossly intact and nonfocal Psychiatric: Normal judgment and insight. Alert and oriented x 3. Normal mood.    Impression and Plan:  Hypoglycemia - Plan: Ambulatory referral to Endocrinology  Nausea and vomiting, intractability of vomiting not specified, unspecified vomiting type - Plan: CT Abdomen Pelvis Wo Contrast  -She is certainly hypoglycemic a significant portion of the time.  I do not believe that a poor appetite alone should explain this in somebody with a normal/overweight BMI. -In office A1c is 5.5. -I will request CT scan of the abdomen and place referral to endocrinology for further evaluation and management. -I have given her some additional freestyle libre samples so we can continue to monitor CBG.  Time spent: 34 minutes reviewing chart, interviewing and examining patient and formulating plan of care     Milayna Rotenberg Isaac Bliss, MD [facility] at [facility]

## 2021-05-08 NOTE — Telephone Encounter (Signed)
Inbound call from patient. She have questions about if she should continue the use of omeprazole.

## 2021-05-27 ENCOUNTER — Other Ambulatory Visit: Payer: Self-pay | Admitting: Internal Medicine

## 2021-05-27 DIAGNOSIS — R112 Nausea with vomiting, unspecified: Secondary | ICD-10-CM

## 2021-06-10 ENCOUNTER — Other Ambulatory Visit: Payer: Self-pay

## 2021-06-10 ENCOUNTER — Ambulatory Visit
Admission: RE | Admit: 2021-06-10 | Discharge: 2021-06-10 | Disposition: A | Discharge status: Home / Self Care | Payer: Self-pay | Source: Ambulatory Visit | Attending: Internal Medicine | Admitting: Internal Medicine

## 2021-06-10 DIAGNOSIS — R112 Nausea with vomiting, unspecified: Secondary | ICD-10-CM

## 2021-06-10 IMAGING — CT CT ABD-PELV W/O CM
1 of 2 series · 14 of 32 positions shown, 19 images · non-contrast
Comparison: Ultrasound [DATE]

CLINICAL DATA: Nausea, vomiting and hypoglycemia.

EXAM:
CT ABDOMEN AND PELVIS WITHOUT CONTRAST
TECHNIQUE: Multidetector CT imaging of the abdomen and pelvis was performed
following the standard protocol without IV contrast.

[Series 2: abd/pelvis w/(date) · axial · 0.64mm/px · z∈[-369,+21]mm · 14 of 88 slices shown, 19 images]
[im 5/88  soft-tissue]
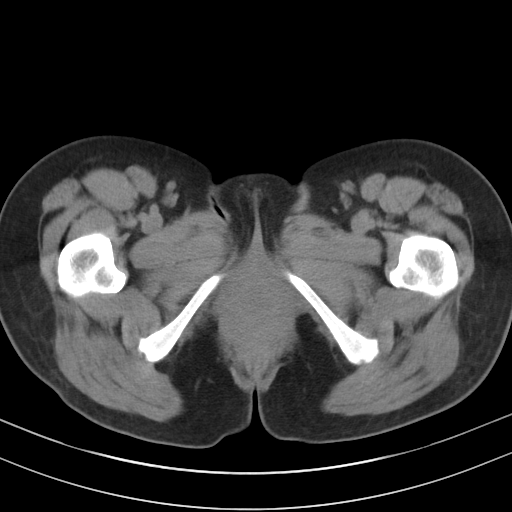
[im 5/88  bone]
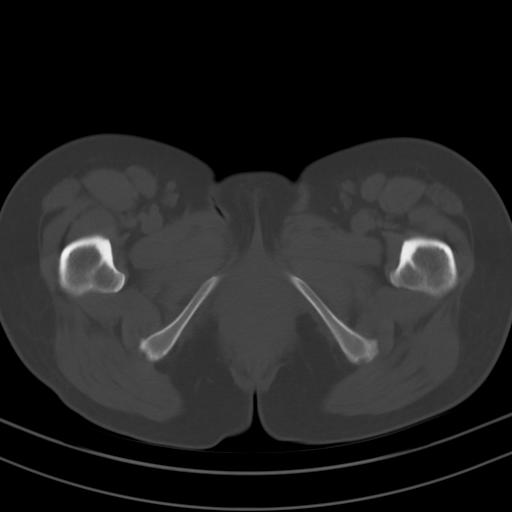
[im 10/88  soft-tissue]
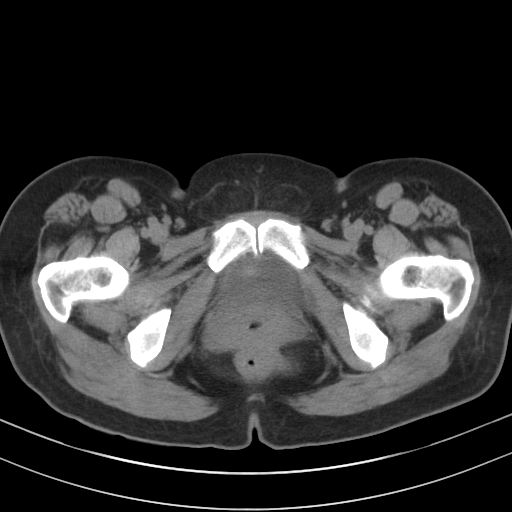
[im 20/88  soft-tissue]
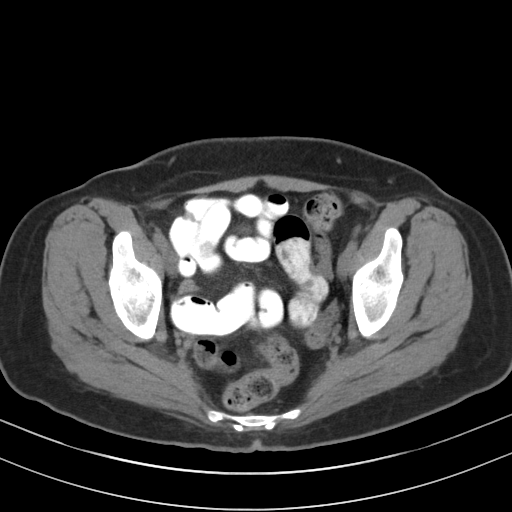
[im 25/88  soft-tissue]
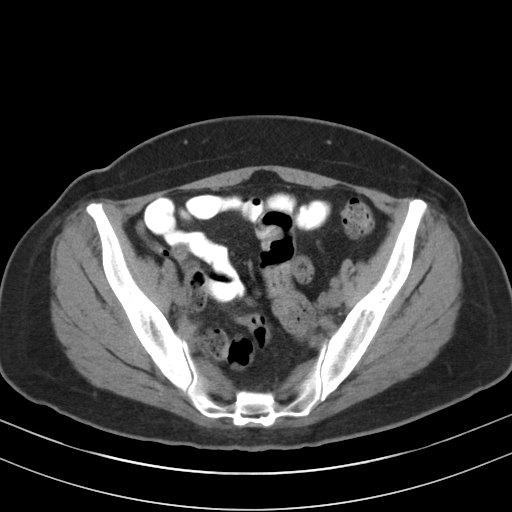
[im 30/88  soft-tissue]
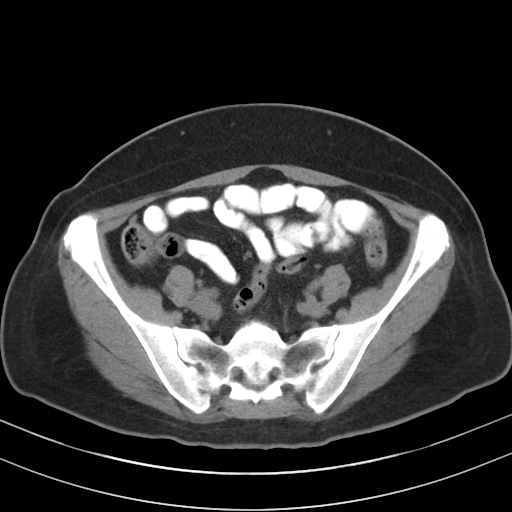
[im 39/88  soft-tissue]
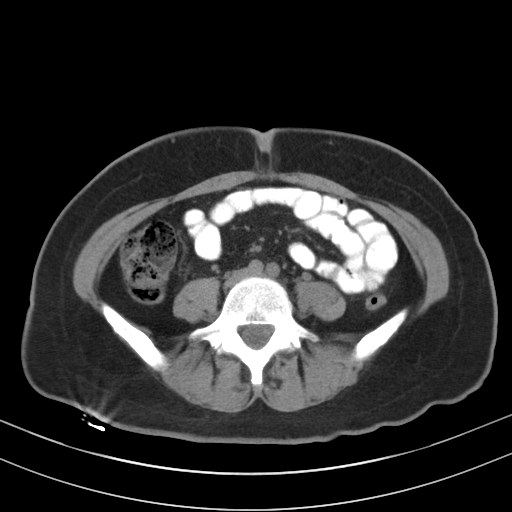
[im 44/88  soft-tissue]
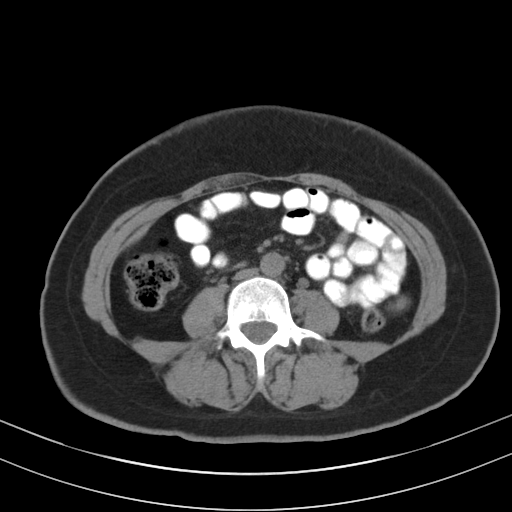
[im 49/88  soft-tissue]
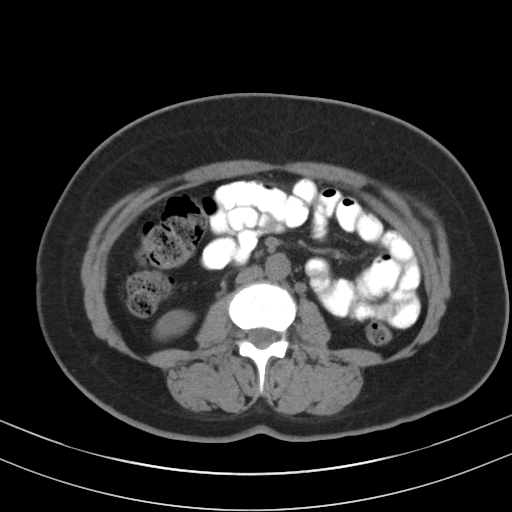
[im 59/88  soft-tissue]
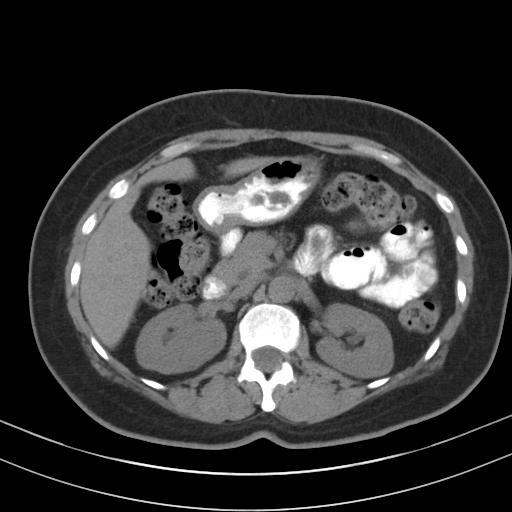
[im 59/88  bone]
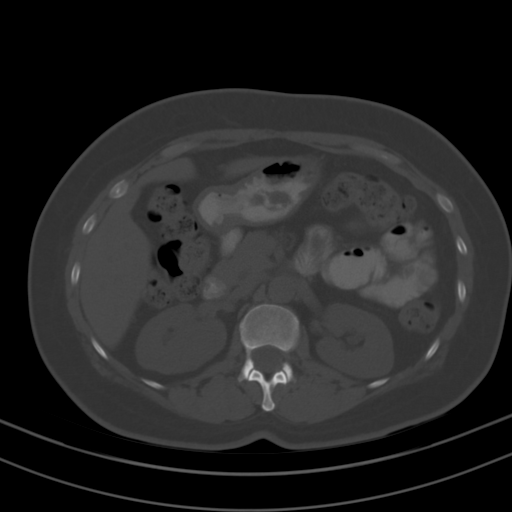
[im 63/88  soft-tissue]
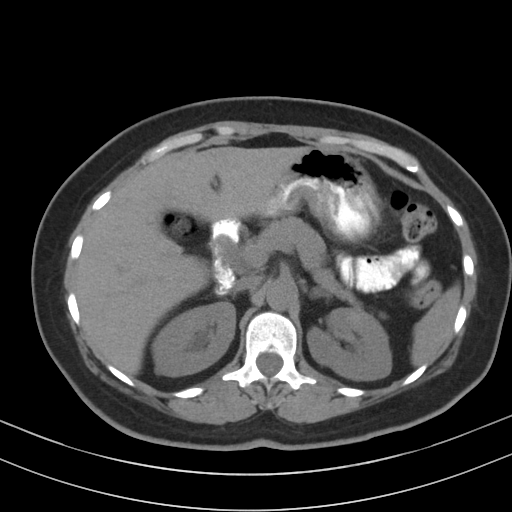
[im 68/88  soft-tissue]
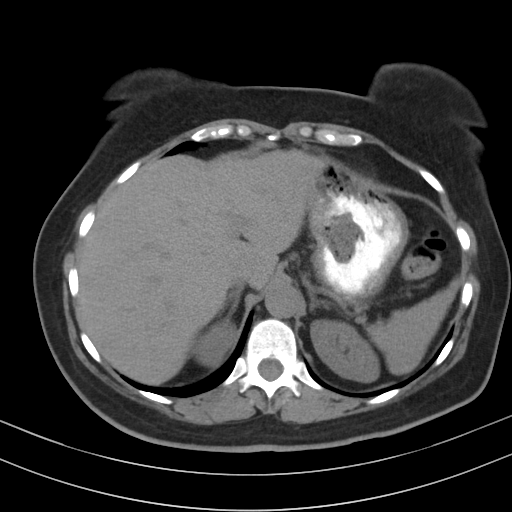
[im 68/88  lung]
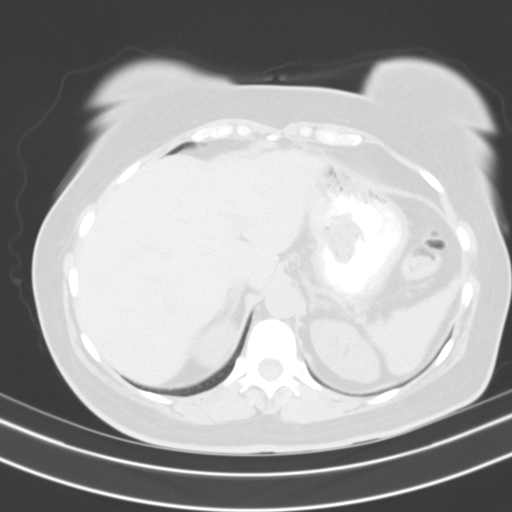
[im 73/88  lung]
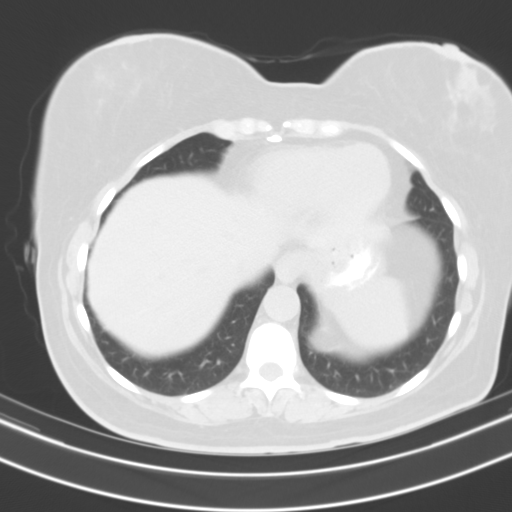
[im 78/88  soft-tissue]
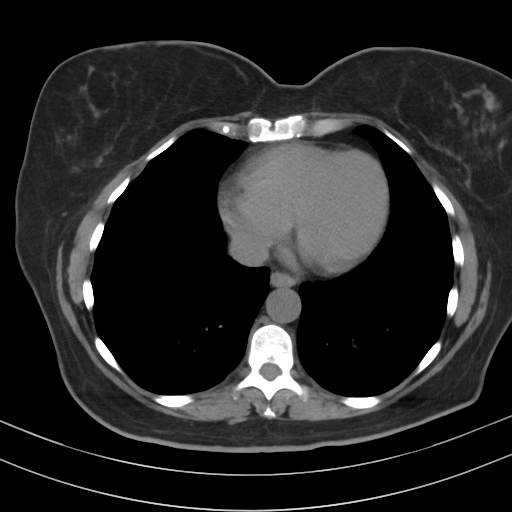
[im 78/88  lung]
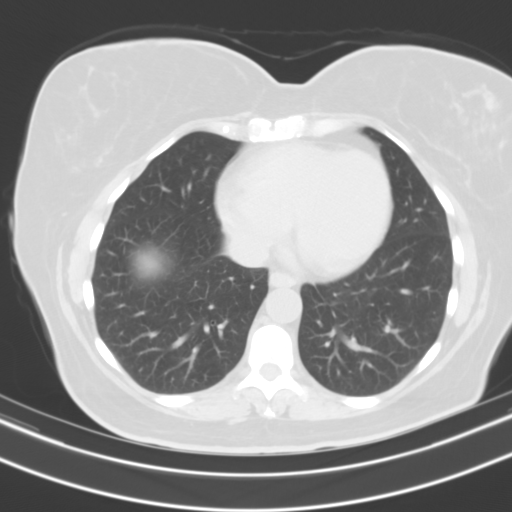
[im 83/88  soft-tissue]
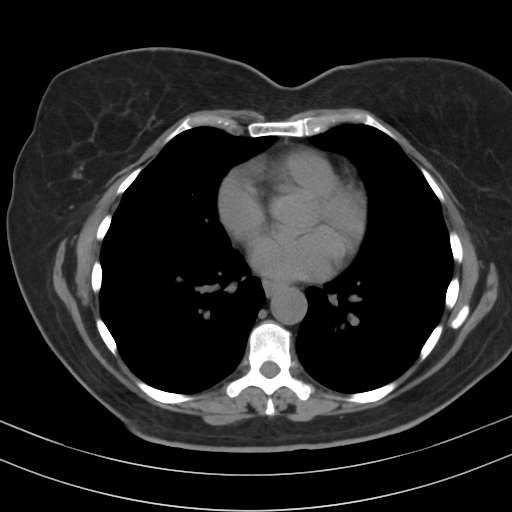
[im 83/88  lung]
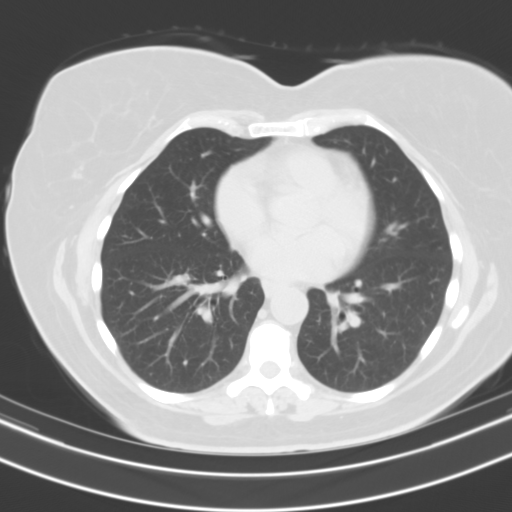

[14 of 32 positions shown; findings below may reference images not displayed]

FINDINGS: Lower chest: No acute abnormality.

Hepatobiliary: Unremarkable noncontrast appearance of the hepatic
parenchyma. Gallbladder surgically absent. No biliary ductal
dilation.

Pancreas: Unremarkable noncontrast appearance of the pancreatic
parenchyma. No pancreatic ductal dilation.

Spleen: Within normal limits.

Adrenals/Urinary Tract: Bilateral adrenal glands are unremarkable.
No hydronephrosis. No renal, ureteral or bladder calculi visualized.
Urinary bladder is unremarkable for degree of distension.

Stomach/Bowel: Radiopaque enteric contrast traverses distal loops of
small bowel. Stomach is unremarkable for degree of distension. No
pathologic dilation of small or large bowel. The appendix appears
normal. Small volume of formed stool throughout colon. No evidence
of acute bowel inflammation.

Vascular/Lymphatic: No abdominal aortic aneurysm. No pathologically
enlarged abdominal or pelvic lymph nodes.

Reproductive: Uterus and bilateral adnexa are unremarkable.

Other: No pneumoperitoneum.  No abdominopelvic ascites.

Musculoskeletal: No acute or significant osseous findings.
IMPRESSION: 1. No acute findings in the abdomen or pelvis. Specifically, no
evidence of bowel obstruction or acute bowel inflammation. Normal
appendix.
2. Prior cholecystectomy.

## 2021-06-11 ENCOUNTER — Other Ambulatory Visit: Payer: Self-pay | Admitting: Internal Medicine

## 2021-06-30 ENCOUNTER — Ambulatory Visit: Payer: No Typology Code available for payment source | Admitting: Gastroenterology

## 2021-09-01 ENCOUNTER — Ambulatory Visit: Payer: No Typology Code available for payment source | Admitting: Gastroenterology

## 2021-09-02 ENCOUNTER — Ambulatory Visit: Payer: No Typology Code available for payment source | Admitting: Internal Medicine

## 2021-09-03 ENCOUNTER — Ambulatory Visit (INDEPENDENT_AMBULATORY_CARE_PROVIDER_SITE_OTHER): Payer: Self-pay | Admitting: Gastroenterology

## 2021-09-03 ENCOUNTER — Ambulatory Visit: Payer: No Typology Code available for payment source | Admitting: Internal Medicine

## 2021-09-03 ENCOUNTER — Encounter: Payer: Self-pay | Admitting: Gastroenterology

## 2021-09-03 VITALS — BP 120/80 | HR 72 | Ht 60.05 in | Wt 124.0 lb

## 2021-09-03 DIAGNOSIS — K5904 Chronic idiopathic constipation: Secondary | ICD-10-CM

## 2021-09-03 DIAGNOSIS — K588 Other irritable bowel syndrome: Secondary | ICD-10-CM

## 2021-09-03 DIAGNOSIS — R109 Unspecified abdominal pain: Secondary | ICD-10-CM

## 2021-09-03 DIAGNOSIS — K219 Gastro-esophageal reflux disease without esophagitis: Secondary | ICD-10-CM

## 2021-09-03 MED ORDER — DICYCLOMINE HCL 10 MG PO CAPS: 10.0000 mg | ORAL_CAPSULE | Freq: Two times a day (BID) | ORAL | 0 refills | Status: DC

## 2021-09-03 MED ORDER — FAMOTIDINE 20 MG PO TABS: 20.0000 mg | ORAL_TABLET | Freq: Two times a day (BID) | ORAL | 3 refills | Status: DC

## 2021-09-03 NOTE — Patient Instructions (Signed)
We have sent prescriptions to your pharmacy  If you are age 59 or older, your body mass index should be between 23-30. Your Body mass index is 24.18 kg/m. If this is out of the aforementioned range listed, please consider follow up with your Primary Care Provider.  If you are age 30 or younger, your body mass index should be between 19-25. Your Body mass index is 24.18 kg/m. If this is out of the aformentioned range listed, please consider follow up with your Primary Care Provider.   ________________________________________________________  The [facility] providers would like to encourage you to use [facility] to communicate with providers for non-urgent requests or questions.  Due to long hold times on the telephone, sending your provider a message by [facility] may be a faster and more efficient way to get a response.  Please allow 48 business hours for a response.  Please remember that this is for non-urgent requests.  _______________________________________________________   I appreciate the  opportunity to care for you  Thank You   Harl Bowie , MD

## 2021-09-03 NOTE — Progress Notes (Signed)
Patricia Stevenson    [id number]    04-23-1961  Primary Care Physician:Hernandez Everardo Beals, MD  Referring Physician: Isaac Bliss, Rayford Halsted, MD [place],  [place] [address]   Chief complaint: Abdominal pain, bloating  HPI 61 year old very pleasant female, Hispanic here for follow-up visit for generalized abdominal pain, bloating and abdominal cramping S/p cholecystectomy 05/29/21, she notes significant improvement of epigastric abdominal pain after gallbladder surgery  She feels she is under tremendous stress from her work and she feels abdominal discomfort more when she is under stress. Is no longer taking PPI  Denies any melena or rectal bleeding She has intermittent constipation, improves with MiraLAX   CT abd & pelvis 06/10/21 1. No acute findings in the abdomen or pelvis. Specifically, no evidence of bowel obstruction or acute bowel inflammation. Normal appendix. 2. Prior cholecystectomy.  Abdominal ultrasound 02/02/21 Cholelithiasis with single mobile stone measuring 1.4 cm diameter. No wall thickening or edema.  EGD 01/26/21 - Z-line regular, 36 cm from the incisors. - No gross lesions in esophagus. 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 - Gastritis. Biopsied. - Normal examined duodenum.   Colonoscopy 01/26/21 - Diverticulosis in the sigmoid colon and in the descending colon. - Non-bleeding internal hemorrhoids. - The examination was otherwise normal.     Outpatient Encounter Medications as of 09/03/2021  Medication Sig   calcium carbonate (OSCAL) 1500 (600 Ca) MG TABS tablet Take by mouth 2 (two) times daily with a meal.   [DISCONTINUED] omeprazole (PRILOSEC) 40 MG capsule Take 1 capsule (40 mg total) by mouth daily.   No facility-administered encounter medications on file as of 09/03/2021.    Allergies as of 09/03/2021   (No Known Allergies)    Past Medical  History:  Diagnosis Date   Hypertension     Past Surgical History:  Procedure Laterality Date   BREAST BIOPSY Left    20 years ago in [place]- benign- not sure if bx or cyst    Family History  Problem Relation Age of Onset   Breast cancer Neg Hx     Social History   Socioeconomic History   Marital status: Married    Spouse name: Not on file   Number of children: Not on file   Years of education: Not on file   Highest education level: Not on file  Occupational History   Not on file  Tobacco Use   Smoking status: Never   Smokeless tobacco: Never  Substance and Sexual Activity   Alcohol use: No   Drug use: No   Sexual activity: Not on file  Other Topics Concern   Not on file  Social History Narrative   Not on file   Social Determinants of Health   Financial Resource Strain: Not on file  Food Insecurity: Not on file  Transportation Needs: Not on file  Physical Activity: Not on file  Stress: Not on file  Social Connections: Not on file  Intimate Partner Violence: Not on file      Review of systems: All other review of systems negative except as mentioned in the HPI.   Physical Exam: Vitals:   09/03/21 0821  BP: 120/80  Pulse: 72   Body mass index is 24.18 kg/m. Gen:      No acute distress HEENT:  sclera anicteric Abd:      soft, non-tender; no palpable masses, no distension  Ext:    No edema Neuro: alert and oriented x 3 Psych: normal mood and affect  Data Reviewed:  Reviewed labs, radiology imaging, old records and pertinent past GI work up   Assessment and Plan/Recommendations:  61 year old very pleasant female with history of cholelithiasis status postcholecystectomy with complaints of generalized abdominal cramping, bloating and discomfort likely secondary to irritable bowel syndrome Use dicyclomine 10 mg every 12 hours as needed for severe abdominal cramping  GERD: Use Pepcid 20 mg twice daily as needed for heartburn and reflux  symptoms Follow antireflux measures  Chronic idiopathic constipation: Use MiraLAX 1 capful daily as needed Increase dietary fiber and water intake  Return in 3 months   This visit required 30 minutes of patient care (this includes precharting, chart review, review of results, face-to-face time used for counseling as well as treatment plan and follow-up. The patient was provided an opportunity to ask questions and all were answered. The patient agreed with the plan and demonstrated an understanding of the instructions.  Damaris Hippo , MD    CC: Isaac Bliss, Est[place]*

## 2021-09-07 ENCOUNTER — Telehealth: Payer: Self-pay | Admitting: Internal Medicine

## 2021-09-07 ENCOUNTER — Ambulatory Visit (INDEPENDENT_AMBULATORY_CARE_PROVIDER_SITE_OTHER): Payer: Self-pay | Admitting: Internal Medicine

## 2021-09-07 ENCOUNTER — Encounter: Payer: Self-pay | Admitting: Internal Medicine

## 2021-09-07 VITALS — BP 120/82 | HR 67 | Temp 98.5°F | Ht 60.06 in | Wt 125.0 lb

## 2021-09-07 DIAGNOSIS — G8191 Hemiplegia, unspecified affecting right dominant side: Secondary | ICD-10-CM

## 2021-09-07 DIAGNOSIS — R29810 Facial weakness: Secondary | ICD-10-CM

## 2021-09-07 DIAGNOSIS — E162 Hypoglycemia, unspecified: Secondary | ICD-10-CM

## 2021-09-07 NOTE — Progress Notes (Signed)
Established Patient Office Visit     This visit occurred during the SARS-CoV-2 public health emergency.  Safety protocols were in place, including screening questions prior to the visit, additional usage of staff PPE, and extensive cleaning of exam room while observing appropriate contact time as indicated for disinfecting solutions.    CC/Reason for Visit: Follow-up chronic conditions/acute concerns  HPI: Patricia Stevenson is a 61 y.o. female who is coming in today for the above mentioned reasons.  She has no significant past medical history.  I last saw her in September.  At that time she had been complaining of "anxiety" that manifested as hand shaking and a sensation of nausea and sometimes emesis.  I suspected hypoglycemia and she was given a sample of a continuous glucose monitor and she was noted to be significantly hypoglycemic over 50% of the day.  A referral for endocrinology was placed but it would appear she was never called for an appointment.  She will need a translator for all referrals as she is mainly Spanish-speaking.  She continues to complain of "half body weakness".  This is of the right side.  It affects both arm and leg.  She suffered a fall on Christmas eve injuring her left hip.  She is not clear as to when this started but it is chronic enough to where she has scheduled an appointment with a personal trainer at the gym to start today.  She has been noticed by myself to have slurred speech today, this was not present at prior appointment.  I asked her to pull her mask off and she does have as well a significant left facial droop.  She drools a lot during the daytime and has had to constantly have a pack of Kleenex with her to wipe her mouth.  These issues were not present when I last saw her in September.  Past Medical/Surgical History: Past Medical History:  Diagnosis Date   Hypertension     Past Surgical History:  Procedure Laterality Date   BREAST BIOPSY Left    20  years ago in [place]- benign- not sure if bx or cyst    Social History:  reports that she has never smoked. She has never used smokeless tobacco. She reports that she does not drink alcohol and does not use drugs.  Allergies: No Known Allergies  Family History:  Family History  Problem Relation Age of Onset   Breast cancer Neg Hx      Current Outpatient Medications:    calcium carbonate (OSCAL) 1500 (600 Ca) MG TABS tablet, Take by mouth 2 (two) times daily with a meal., Disp: , Rfl:    dicyclomine (BENTYL) 10 MG capsule, Take 1 capsule (10 mg total) by mouth in the morning and at bedtime. As needed, Disp: 90 capsule, Rfl: 0   famotidine (PEPCID) 20 MG tablet, Take 1 tablet (20 mg total) by mouth 2 (two) times daily., Disp: 60 tablet, Rfl: 3  Review of Systems:  Constitutional: Denies fever, chills, diaphoresis. HEENT: Denies photophobia, eye pain, redness, hearing loss, ear pain, congestion, sore throat, rhinorrhea, sneezing, mouth sores, trouble swallowing, neck pain, neck stiffness and tinnitus.   Respiratory: Denies SOB, DOE, cough, chest tightness,  and wheezing.   Cardiovascular: Denies chest pain, palpitations and leg swelling.  Gastrointestinal: Denies nausea, vomiting, abdominal pain, diarrhea, constipation, blood in stool and abdominal distention.  Genitourinary: Denies dysuria, urgency, frequency, hematuria, flank pain and difficulty urinating.  Endocrine: Denies: hot or  cold intolerance, sweats, changes in hair or nails, polyuria, polydipsia. Musculoskeletal: Denies myalgias, back pain, joint swelling, arthralgias and gait problem.  Skin: Denies pallor, rash and wound.  Neurological: Denies dizziness, seizures, syncope, light-headedness, numbness and headaches.  Hematological: Denies adenopathy. Easy bruising, personal or family bleeding history  Psychiatric/Behavioral: Denies suicidal ideation,  confusion,  sleep disturbance and agitation    Physical Exam: Vitals:    09/07/21 1038  BP: 120/82  Pulse: 67  Temp: 98.5 F (36.9 C)  TempSrc: Oral  SpO2: 99%  Weight: 125 lb (56.7 kg)  Height: 5' 0.06" (1.526 m)    Body mass index is 24.36 kg/m.   Constitutional: NAD, calm, comfortable Eyes: PERRL, lids and conjunctivae normal ENMT: Mucous membranes are moist.  Respiratory: clear to auscultation bilaterally, no wheezing, no crackles. Normal respiratory effort. No accessory muscle use.  Cardiovascular: Regular rate and rhythm, no murmurs / rubs / gallops. No extremity edema. 2+ pedal pulses. No carotid bruits.  Abdomen: no tenderness, no masses palpated. No hepatosplenomegaly. Bowel sounds positive.  Musculoskeletal: no clubbing / cyanosis. No joint deformity upper and lower extremities. Good ROM, no contractures. Normal muscle tone.  Skin: no rashes, lesions, ulcers. No induration Neurologic: She has a significant left facial droop to the point where her right nasolabial fold is significantly more pronounced compared to the left. Psychiatric: Normal judgment and insight. Alert and oriented x 3. Normal mood.    Impression and Plan:  Right hemiparesis ([place]) Facial droop  - Plan: CT HEAD WO CONTRAST (5MM) -With right hemiparesis, left facial droop, drooling, slurred speech, extremely concerned that she has suffered a left MCA CVA. -At this point work-up is not emergent as the symptoms are not new, she cannot pinpoint timeframe but at least weeks to month. -If CT negative I will proceed to MRI brain.  Hypoglycemia -Another sample of the freestyle libre given today, needs follow-up with endocrinology.  Time spent: 32 minutes reviewing chart, interviewing and examining patient and formulating plan of care.     Lelon Frohlich, MD [facility]

## 2021-09-07 NOTE — Telephone Encounter (Signed)
Pt seen dr Jerilee Hoh today and would like to speak with nurse concerning endocrinologist referral

## 2021-09-08 ENCOUNTER — Ambulatory Visit (INDEPENDENT_AMBULATORY_CARE_PROVIDER_SITE_OTHER)
Admission: RE | Admit: 2021-09-08 | Discharge: 2021-09-08 | Disposition: A | Discharge status: Home / Self Care | Payer: Self-pay | Source: Ambulatory Visit | Attending: Internal Medicine | Admitting: Internal Medicine

## 2021-09-08 ENCOUNTER — Other Ambulatory Visit: Payer: Self-pay | Admitting: Neurosurgery

## 2021-09-08 ENCOUNTER — Telehealth: Payer: Self-pay

## 2021-09-08 ENCOUNTER — Emergency Department (HOSPITAL_COMMUNITY): Payer: Self-pay

## 2021-09-08 ENCOUNTER — Inpatient Hospital Stay (HOSPITAL_COMMUNITY)
Admission: EM | Admit: 2021-09-08 | Discharge: 2021-09-10 | DRG: 054 | Disposition: A | Discharge status: Home / Self Care | Payer: Self-pay | Attending: Neurosurgery | Admitting: Neurosurgery

## 2021-09-08 ENCOUNTER — Other Ambulatory Visit: Payer: Self-pay

## 2021-09-08 ENCOUNTER — Encounter (HOSPITAL_COMMUNITY): Payer: Self-pay | Admitting: Neurosurgery

## 2021-09-08 DIAGNOSIS — E559 Vitamin D deficiency, unspecified: Secondary | ICD-10-CM | POA: Diagnosis present

## 2021-09-08 DIAGNOSIS — D430 Neoplasm of uncertain behavior of brain, supratentorial: Principal | ICD-10-CM | POA: Diagnosis present

## 2021-09-08 DIAGNOSIS — G8191 Hemiplegia, unspecified affecting right dominant side: Secondary | ICD-10-CM

## 2021-09-08 DIAGNOSIS — D496 Neoplasm of unspecified behavior of brain: Secondary | ICD-10-CM | POA: Diagnosis present

## 2021-09-08 DIAGNOSIS — G9389 Other specified disorders of brain: Secondary | ICD-10-CM

## 2021-09-08 DIAGNOSIS — G935 Compression of brain: Secondary | ICD-10-CM | POA: Diagnosis present

## 2021-09-08 DIAGNOSIS — R29810 Facial weakness: Secondary | ICD-10-CM

## 2021-09-08 DIAGNOSIS — Z20822 Contact with and (suspected) exposure to covid-19: Secondary | ICD-10-CM | POA: Diagnosis present

## 2021-09-08 DIAGNOSIS — G936 Cerebral edema: Secondary | ICD-10-CM | POA: Diagnosis present

## 2021-09-08 LAB — CBC
HCT: 40.5 % (ref 36.0–46.0)
Hemoglobin: 13.1 g/dL (ref 12.0–15.0)
MCH: 29.8 pg (ref 26.0–34.0)
MCHC: 32.3 g/dL (ref 30.0–36.0)
MCV: 92 fL (ref 80.0–100.0)
Platelets: 220 10*3/uL (ref 150–400)
RBC: 4.4 MIL/uL (ref 3.87–5.11)
RDW: 13 % (ref 11.5–15.5)
WBC: 6.3 10*3/uL (ref 4.0–10.5)
nRBC: 0 % (ref 0.0–0.2)

## 2021-09-08 LAB — RESP PANEL BY RT-PCR (FLU A&B, COVID) ARPGX2
Influenza A by PCR: NEGATIVE
Influenza B by PCR: NEGATIVE
SARS Coronavirus 2 by RT PCR: NEGATIVE

## 2021-09-08 LAB — CBC WITH DIFFERENTIAL/PLATELET
Abs Immature Granulocytes: 0 10*3/uL (ref 0.00–0.07)
Basophils Absolute: 0 10*3/uL (ref 0.0–0.1)
Basophils Relative: 1 %
Eosinophils Absolute: 0.1 10*3/uL (ref 0.0–0.5)
Eosinophils Relative: 1 %
HCT: 39.8 % (ref 36.0–46.0)
Hemoglobin: 12.9 g/dL (ref 12.0–15.0)
Immature Granulocytes: 0 %
Lymphocytes Relative: 33 %
Lymphs Abs: 1.7 10*3/uL (ref 0.7–4.0)
MCH: 30.2 pg (ref 26.0–34.0)
MCHC: 32.4 g/dL (ref 30.0–36.0)
MCV: 93.2 fL (ref 80.0–100.0)
Monocytes Absolute: 0.3 10*3/uL (ref 0.1–1.0)
Monocytes Relative: 6 %
Neutro Abs: 3 10*3/uL (ref 1.7–7.7)
Neutrophils Relative %: 59 %
Platelets: 248 10*3/uL (ref 150–400)
RBC: 4.27 MIL/uL (ref 3.87–5.11)
RDW: 13.1 % (ref 11.5–15.5)
WBC: 5.1 10*3/uL (ref 4.0–10.5)
nRBC: 0 % (ref 0.0–0.2)

## 2021-09-08 LAB — COMPREHENSIVE METABOLIC PANEL
ALT: 17 U/L (ref 0–44)
AST: 16 U/L (ref 15–41)
Albumin: 4 g/dL (ref 3.5–5.0)
Alkaline Phosphatase: 74 U/L (ref 38–126)
Anion gap: 5 (ref 5–15)
BUN: 16 mg/dL (ref 6–20)
CO2: 25 mmol/L (ref 22–32)
Calcium: 8.3 mg/dL — ABNORMAL LOW (ref 8.9–10.3)
Chloride: 105 mmol/L (ref 98–111)
Creatinine, Ser: 0.53 mg/dL (ref 0.44–1.00)
GFR, Estimated: >60 mL/min (ref >60)
Glucose, Bld: 94 mg/dL (ref 70–99)
Potassium: 4 mmol/L (ref 3.5–5.1)
Sodium: 135 mmol/L (ref 135–145)
Total Bilirubin: 0.4 mg/dL (ref 0.3–1.2)
Total Protein: 7.1 g/dL (ref 6.5–8.1)

## 2021-09-08 LAB — I-STAT BETA HCG BLOOD, ED (MC, WL, AP ONLY): I-stat hCG, quantitative: <5 m[IU]/mL (ref <5)

## 2021-09-08 LAB — CREATININE, SERUM
Creatinine, Ser: 0.62 mg/dL (ref 0.44–1.00)
GFR, Estimated: >60 mL/min (ref >60)

## 2021-09-08 LAB — APTT: aPTT: 26 seconds (ref 24–36)

## 2021-09-08 LAB — CBG MONITORING, ED: Glucose-Capillary: 90 mg/dL (ref 70–99)

## 2021-09-08 LAB — PROTIME-INR
INR: 0.9 (ref 0.8–1.2)
Prothrombin Time: 12.6 seconds (ref 11.4–15.2)

## 2021-09-08 IMAGING — CT CT HEAD W/O CM
3 of 4 series · 14 of 47 positions shown, 16 images · non-contrast
Comparison: None.

CLINICAL DATA: New onset left-sided weakness and right facial droop



[Series 3: head 2.0 hr68 · axial · 0.39mm/px · z∈[+114,+222]mm · 8 of 68 slices shown, 10 images]
[im 7/68  brain]
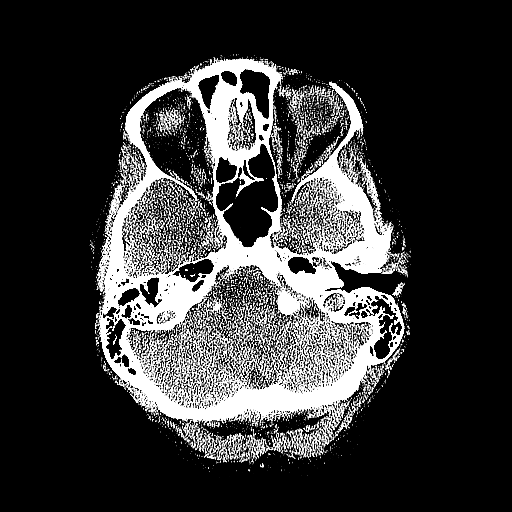
[im 7/68  bone]
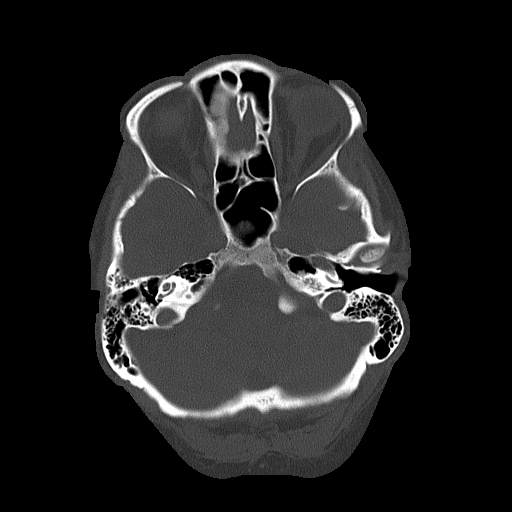
[im 14/68  brain]
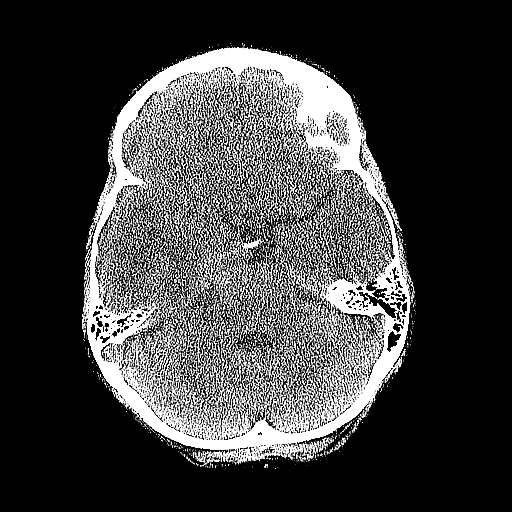
[im 21/68  brain]
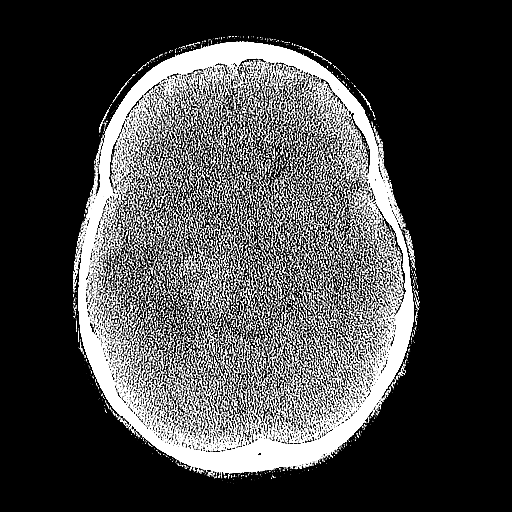
[im 31/68  brain]
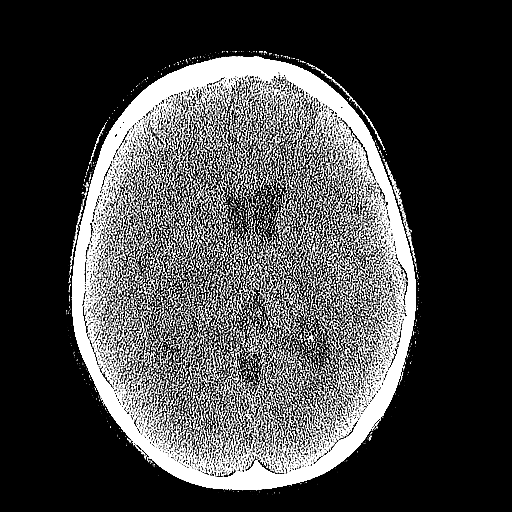
[im 37/68  brain]
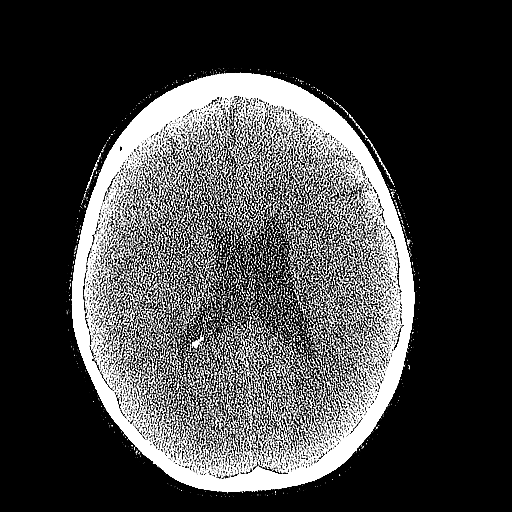
[im 37/68  bone]
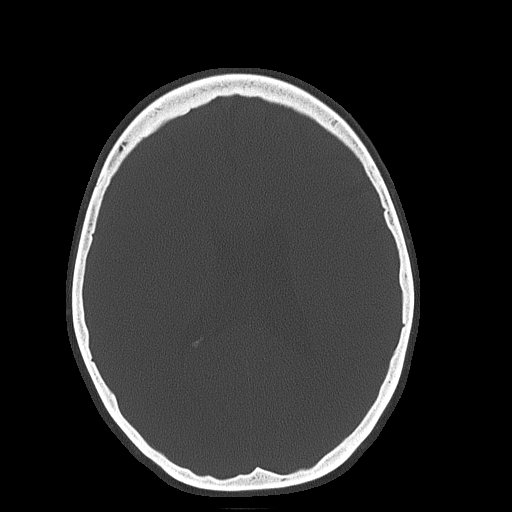
[im 47/68  brain]
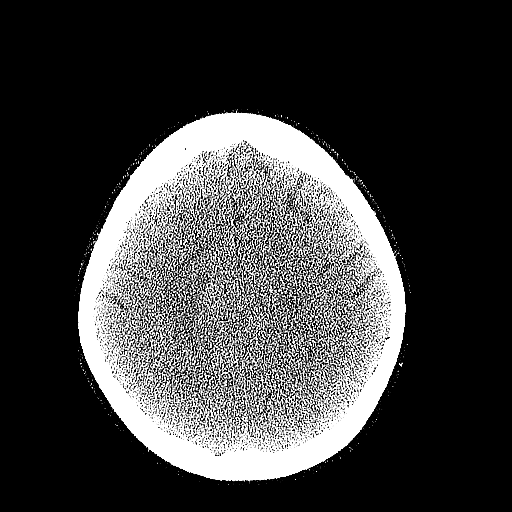
[im 54/68  brain]
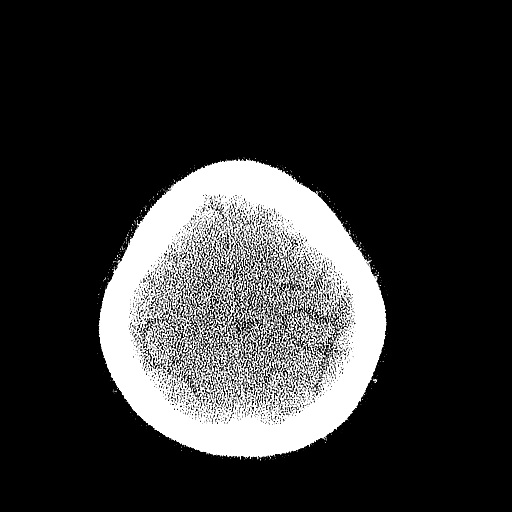
[im 61/68  brain]
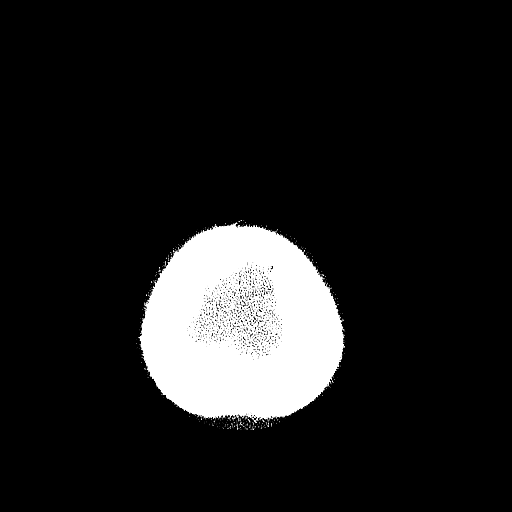

[Series 4: head 3.0 mpr cor · coronal · 0.28mm/px · 3 of 60 slices shown]
[im 20/60  brain]
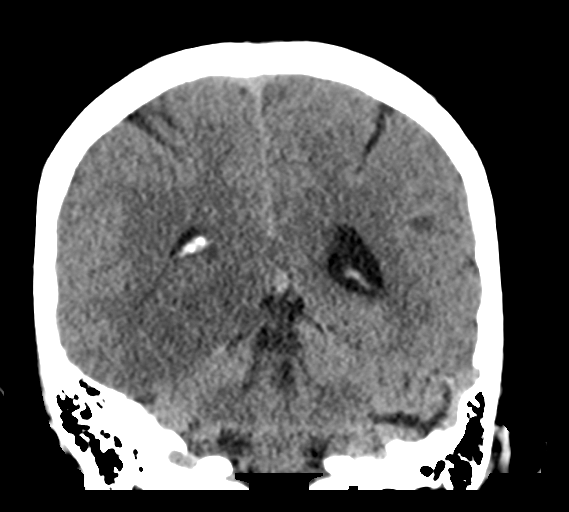
[im 27/60  brain]
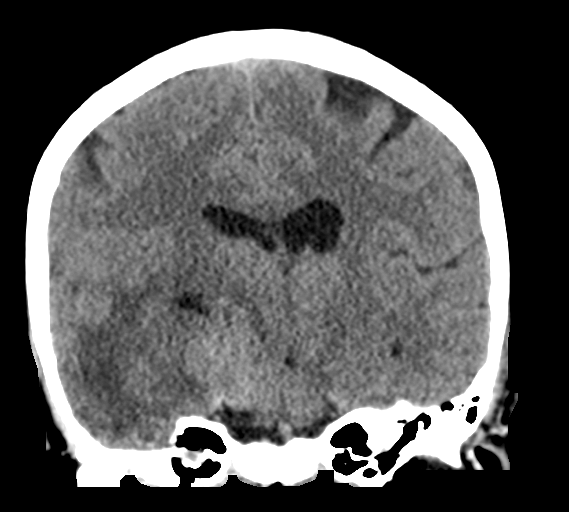
[im 33/60  brain]
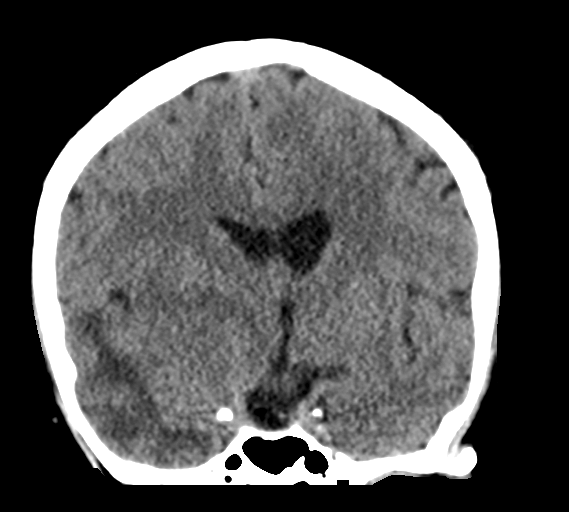

[Series 5: head 3.0 mpr sag · sagittal · 0.28mm/px · 3 of 49 slices shown]
[im 17/49  brain]
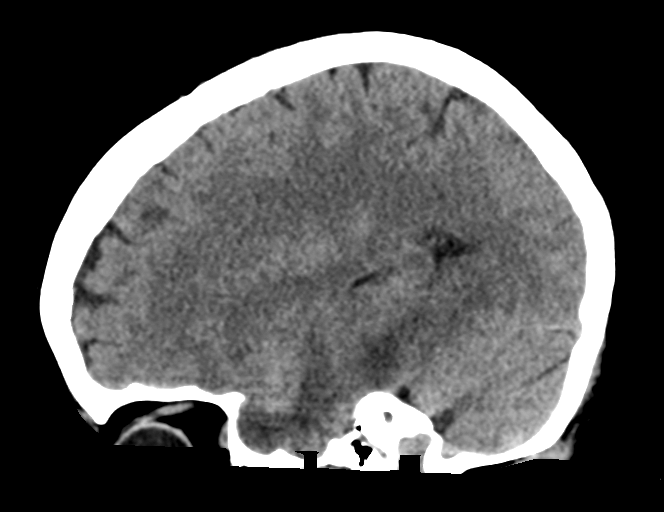
[im 25/49  brain]
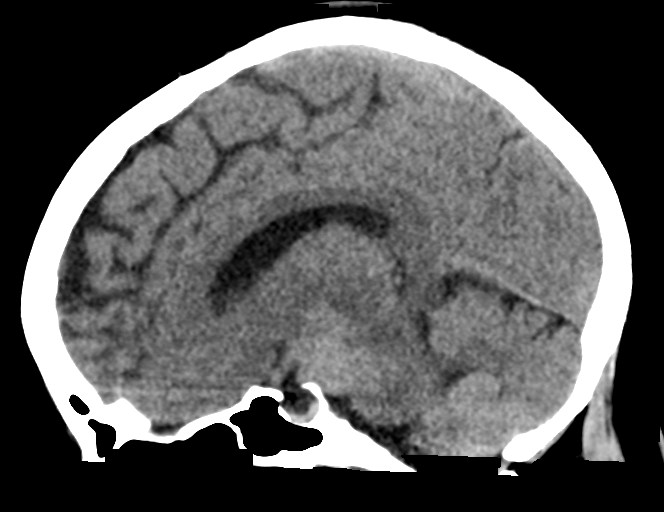
[im 33/49  brain]
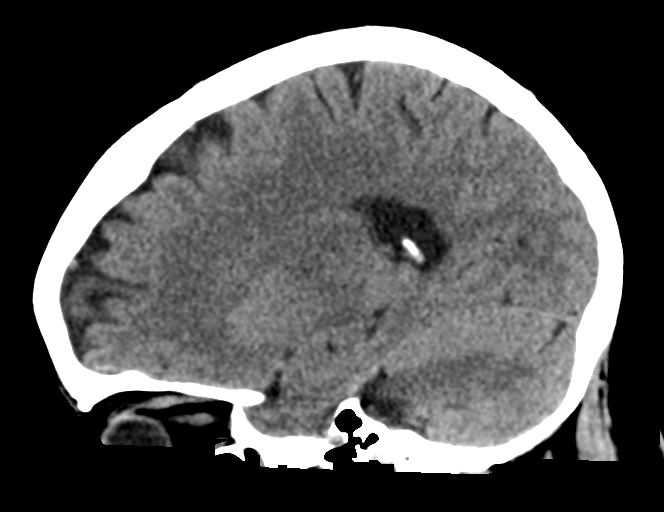

[14 of 47 positions shown; findings below may reference images not displayed]

FINDINGS: Brain: Moderately hyperdense mass lesion in the right cerebral
peduncle, midbrain, and thalamus measuring about 2.6 by 2.4 by
cm on image 33 series 4 and image 8 series 2. Adjacent substantial
hypodensity particularly in white matter of the right temporal lobe
and tracking in the posterior portion of the right internal capsule
and right parietal lobe, primarily resembling vasogenic edema. There
is about 6 mm of left-to-right midline shift at the level of the
third ventricle on image 10 series 2. There is likely some
effacement of the temporal horn of the right lateral ventricle,
right sylvian fissure, and of the right suprasellar cistern. No
overt hydrocephalus.

Vascular: The hyperdense right-sided mass does partially abut the
right cavernous sinus and right ICA although I am skeptical that
this is an aneurysm based on its contour.

Skull: Unremarkable

Sinuses/Orbits: Unremarkable

Other: No supplemental non-categorized findings.
IMPRESSION: 1. 3.4 cm in long axis hyperdense but somewhat indistinctly
marginated mass like lesion along the right cerebral peduncle, right
thalamus, and with potential involvement of the right mid brain.
There is primarily vasogenic edema in the right temporal lobe and
adjacent right parietal lobe and along adjacent white matter tracks
along with effacement of part of the right suprasellar cistern,
sylvian fissure, and temporal horn of the right lateral ventricle
along with 6 mm of right to left midline shift of the third
ventricle. More likely possibilities include mass lesions such as
high-grade glioma, hemorrhagic infarct simulating a mass like
appearance, or conceivably abscess. MRI brain with and without
contrast is recommended for further characterization.

Critical Value/emergent results were called by telephone at the time
of interpretation on [DATE] at [DATE] to provider HELDER ALMEIDA
HELDER ALMEIDA , who verbally acknowledged these results. The
patient was imaged as an outpatient, and Dr. HELDER ALMEIDA is arranging for
the patient to proceed immediately to the emergency department on an
emergent basis.

## 2021-09-08 IMAGING — MR MR HEAD WO/W CM
9 of 13 series · 34 of 48 positions shown · IV contrast (Yes   MULTIHANCE)
Comparison: CT head [DATE]

CLINICAL DATA: Abnormal head CT.  Evaluate brain mass.

EXAM:
MRI HEAD WITHOUT AND WITH CONTRAST
TECHNIQUE: Multiplanar, multiecho pulse sequences of the brain and surrounding
structures were obtained without and with intravenous contrast.
CONTRAST:  5.5mL GADAVIST GADOBUTROL 1 MMOL/ML IV SOLN

[Series 3: DWI · axial · 3.0mm · 1.09mm/px · z∈[-60,+92]mm · 9 of 104 slices shown (1 of 4)]
[im 1/104]
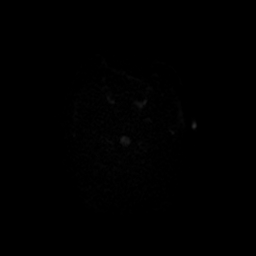
[im 13/104]
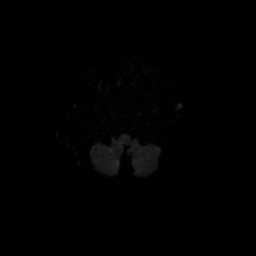
[im 26/104]
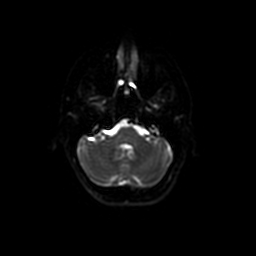
[im 39/104]
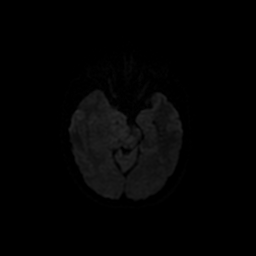
[im 52/104]
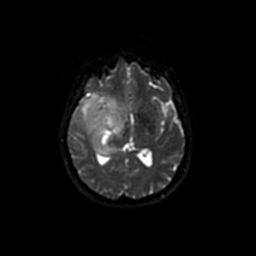
[im 65/104]
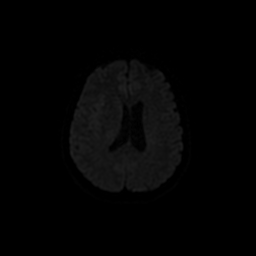
[im 78/104]
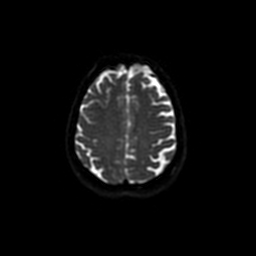
[im 91/104]
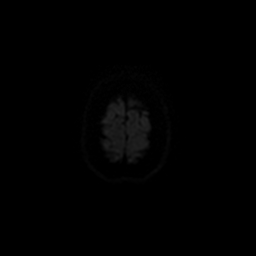
[im 104/104]
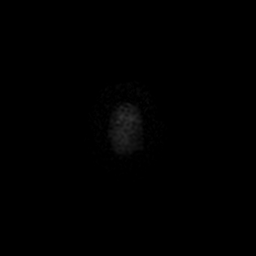

[Series 4: DWI · coronal · 5.0mm · 1.09mm/px · 6 of 70 slices shown (2 of 4)]
[im 1/70]
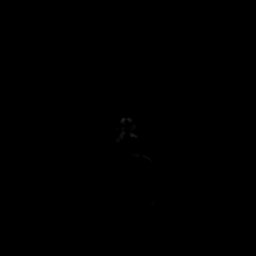
[im 14/70]
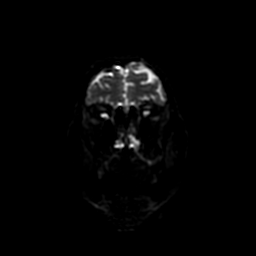
[im 28/70]
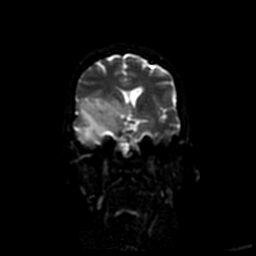
[im 42/70]
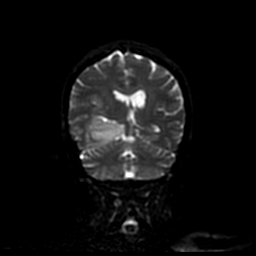
[im 56/70]
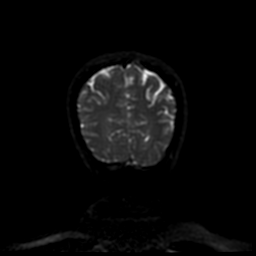
[im 70/70]
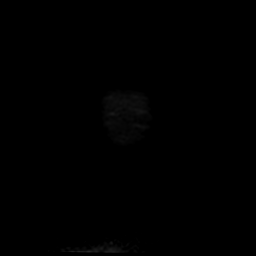

[Series 6: T2 · axial · 5.0mm · 0.43mm/px · z∈[-61,+76]mm · 2 of 24 slices shown]
[im 1/24]
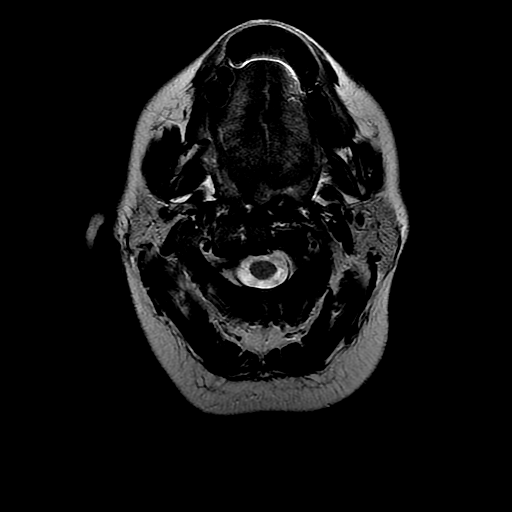
[im 24/24]
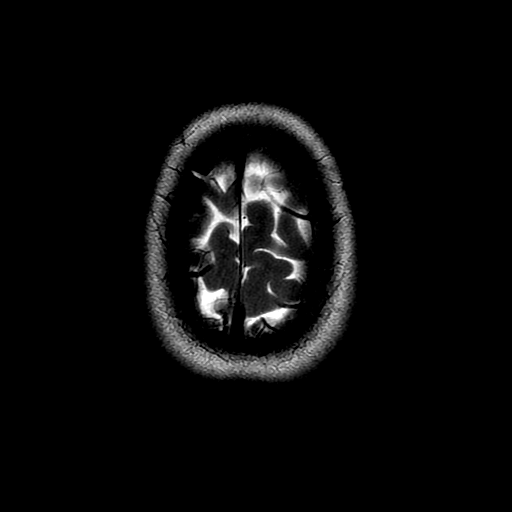

[Series 7: FLAIR · axial · 3.0mm · 0.43mm/px · z∈[-61,+76]mm · 2 of 24 slices shown]
[im 1/24]
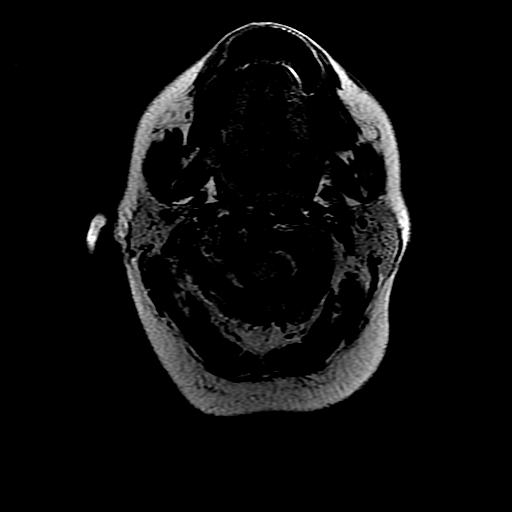
[im 24/24]
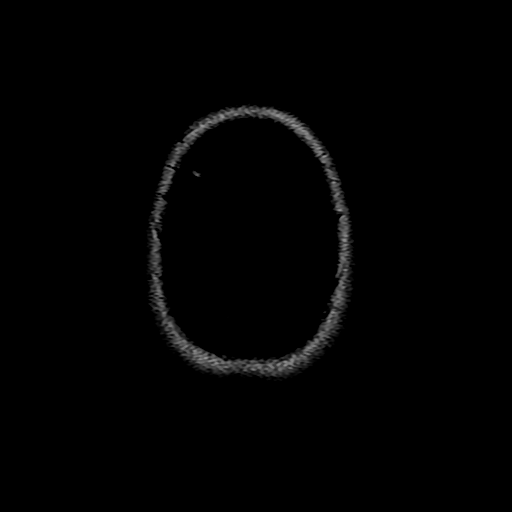

[Series 11: T1 post-contrast · axial · 3.0mm · 0.43mm/px · z∈[-66,+81]mm · 4 of 50 slices shown (1 of 3)]
[im 1/50]
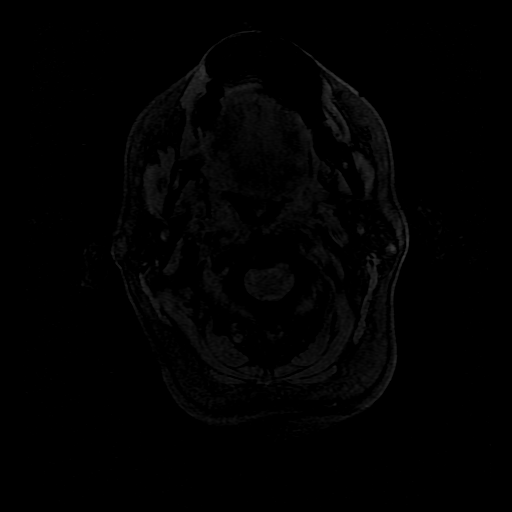
[im 17/50]
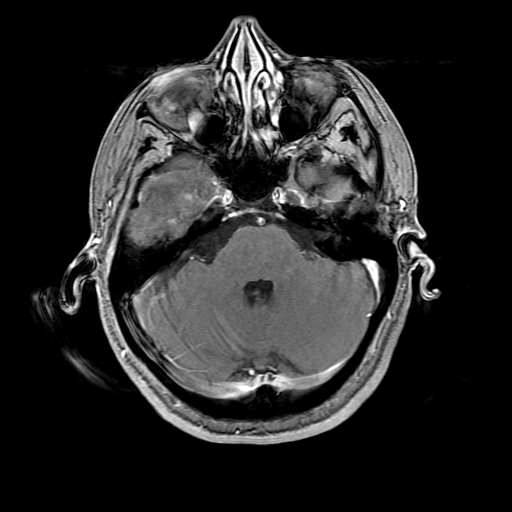
[im 33/50]
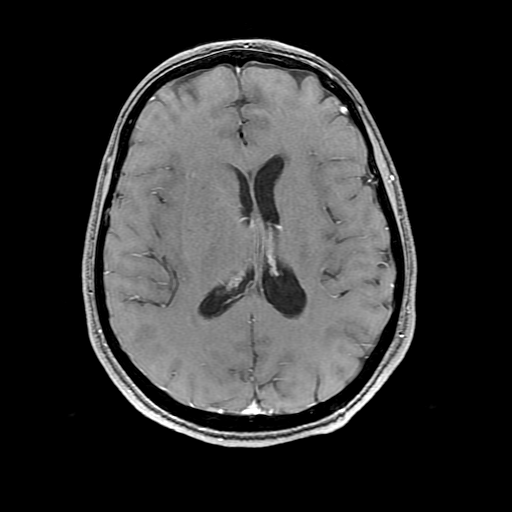
[im 50/50]
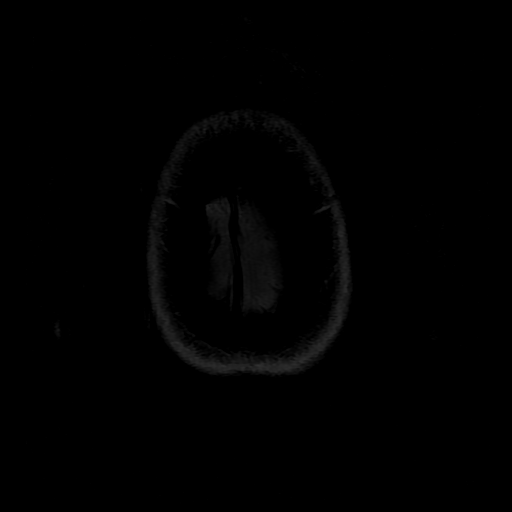

[Series 12: T1 post-contrast · coronal · 5.0mm · 0.39mm/px · 2 of 24 slices shown (2 of 3)]
[im 1/24]
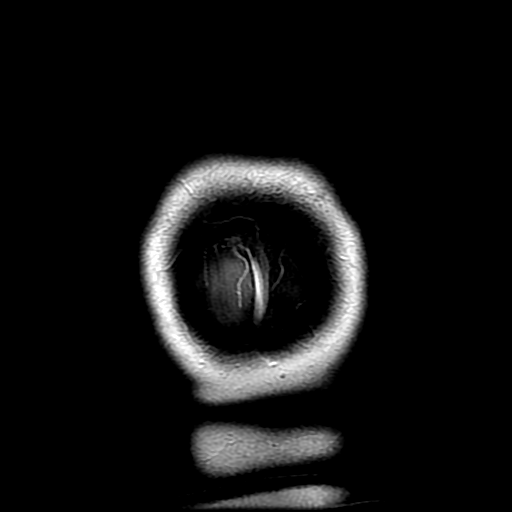
[im 24/24]
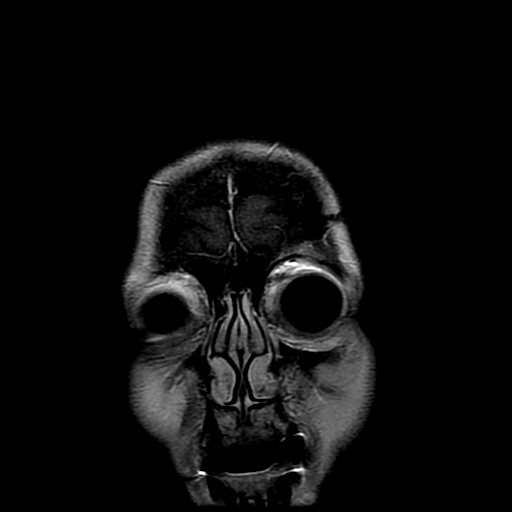

[Series 13: T1 post-contrast · sagittal · 5.0mm · 0.47mm/px · 2 of 25 slices shown (3 of 3)]
[im 1/25]
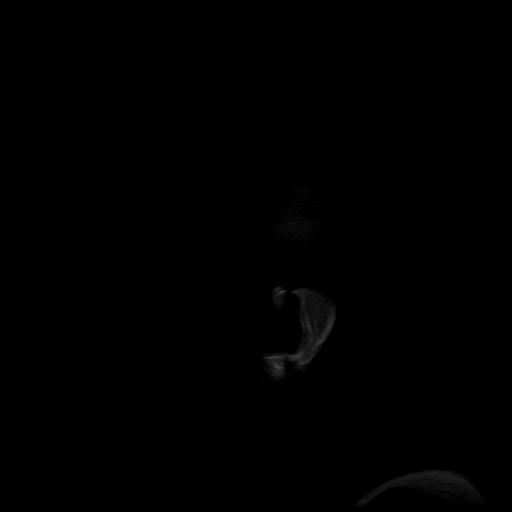
[im 25/25]
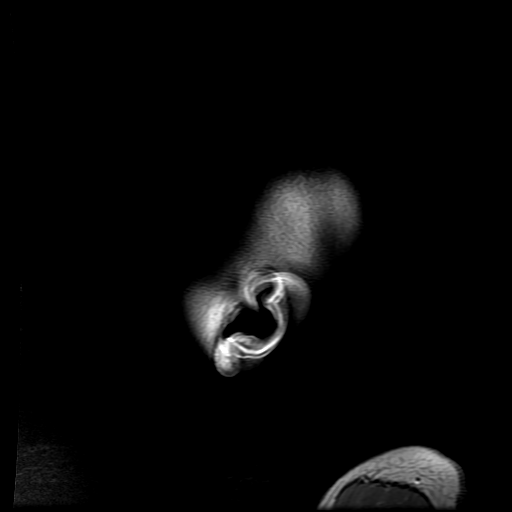

[Series 300: DWI · axial · 3.0mm · 1.09mm/px · z∈[-60,+92]mm · 4 of 52 slices shown (3 of 4)]
[im 1/52]
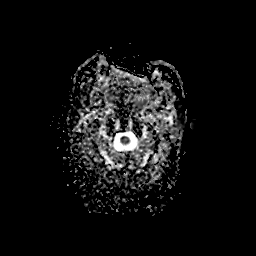
[im 18/52]
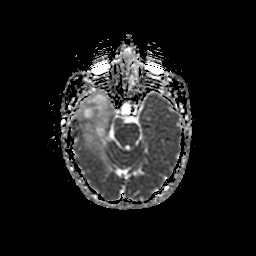
[im 35/52]
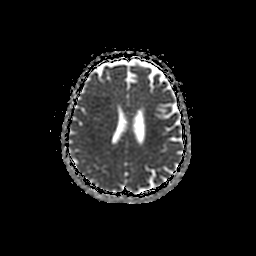
[im 52/52]
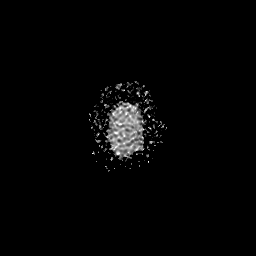

[Series 400: DWI · coronal · 5.0mm · 1.09mm/px · 3 of 34 slices shown (4 of 4)]
[im 1/34]
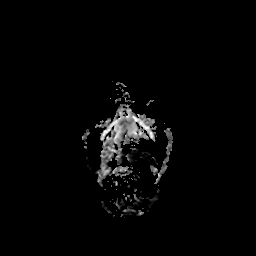
[im 17/34]
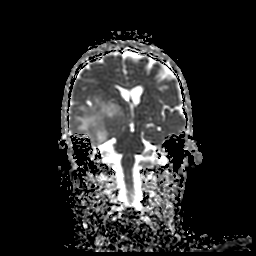
[im 34/34]
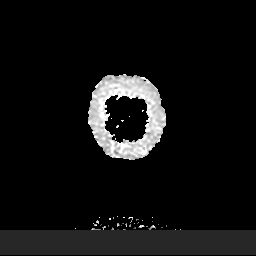

[34 of 48 positions shown; findings below may reference images not displayed]

FINDINGS: Brain: Infiltrating mass lesion centered in the right medial
temporal lobe. The mass infiltrates most of the right temporal lobe
and extends into the insula. There is extension into the right
lateral basal ganglia. There is also T2 hyperintensity in the right
pons and midbrain likely due to tumor infiltration. There is local
mass-effect and 5 mm midline shift to the left. No associated
hemorrhage. The mass shows a degree of restricted diffusion. The
mass does not show significant enhancement.

Ventricle size normal. 5 mm midline shift to the left. Negative for
acute infarct.

Vascular: Normal arterial flow voids.

Skull and upper cervical spine: Negative

Sinuses/Orbits: Paranasal sinuses clear.  Negative orbit

Other: None
IMPRESSION: Large infiltrating mass centered in the right medial temporal lobe.
No associated enhancement or hemorrhage. Favor high-grade glioma
such as glioblastoma.

## 2021-09-08 MED ORDER — VITAMIN D 25 MCG (1000 UNIT) PO TABS
1000.0000 [IU] | ORAL_TABLET | Freq: Every day | ORAL | Status: DC
  Administered 2021-09-08 – 2021-09-09 (×2): 1000 [IU] via ORAL
  Filled 2021-09-08 (×2): qty 1

## 2021-09-08 MED ORDER — DEXAMETHASONE SODIUM PHOSPHATE 4 MG/ML IJ SOLN
4.0000 mg | Freq: Four times a day (QID) | INTRAMUSCULAR | Status: DC
  Administered 2021-09-08 – 2021-09-10 (×7): 4 mg via INTRAVENOUS
  Filled 2021-09-08 (×8): qty 1

## 2021-09-08 MED ORDER — METOPROLOL TARTRATE 5 MG/5ML IV SOLN: 5.0000 mg | Freq: Four times a day (QID) | INTRAVENOUS | Status: DC | PRN

## 2021-09-08 MED ORDER — VITAMIN C 250 MG PO TABS
ORAL_TABLET | Freq: Every day | ORAL | Status: DC
  Filled 2021-09-08: qty 1

## 2021-09-08 MED ORDER — DICYCLOMINE HCL 10 MG PO CAPS
10.0000 mg | ORAL_CAPSULE | Freq: Four times a day (QID) | ORAL | Status: DC | PRN
  Filled 2021-09-08: qty 1

## 2021-09-08 MED ORDER — LEVETIRACETAM 500 MG PO TABS
500.0000 mg | ORAL_TABLET | Freq: Two times a day (BID) | ORAL | Status: DC
  Administered 2021-09-08 – 2021-09-09 (×4): 500 mg via ORAL
  Filled 2021-09-08 (×4): qty 1

## 2021-09-08 MED ORDER — GADOBUTROL 1 MMOL/ML IV SOLN
5.5000 mL | Freq: Once | INTRAVENOUS | Status: AC | PRN
  Administered 2021-09-08: 5.5 mL via INTRAVENOUS

## 2021-09-08 MED ORDER — DEXAMETHASONE SODIUM PHOSPHATE 10 MG/ML IJ SOLN
10.0000 mg | Freq: Once | INTRAMUSCULAR | Status: AC
  Administered 2021-09-08: 10 mg via INTRAVENOUS
  Filled 2021-09-08: qty 1

## 2021-09-08 MED ORDER — ACETAMINOPHEN 650 MG RE SUPP: 650.0000 mg | Freq: Four times a day (QID) | RECTAL | Status: DC | PRN

## 2021-09-08 MED ORDER — DOCUSATE SODIUM 100 MG PO CAPS
100.0000 mg | ORAL_CAPSULE | Freq: Two times a day (BID) | ORAL | Status: DC
  Administered 2021-09-08 – 2021-09-09 (×4): 100 mg via ORAL
  Filled 2021-09-08 (×4): qty 1

## 2021-09-08 MED ORDER — ACETAMINOPHEN 325 MG PO TABS: 650.0000 mg | ORAL_TABLET | Freq: Four times a day (QID) | ORAL | Status: DC | PRN

## 2021-09-08 MED ORDER — SODIUM CHLORIDE 0.9% FLUSH: 3.0000 mL | INTRAVENOUS | Status: DC | PRN

## 2021-09-08 MED ORDER — ASCORBIC ACID 500 MG PO TABS
500.0000 mg | ORAL_TABLET | Freq: Every day | ORAL | Status: DC
  Administered 2021-09-09: 500 mg via ORAL
  Filled 2021-09-08: qty 1

## 2021-09-08 MED ORDER — HEPARIN SODIUM (PORCINE) 5000 UNIT/ML IJ SOLN
5000.0000 [IU] | Freq: Three times a day (TID) | INTRAMUSCULAR | Status: DC
  Administered 2021-09-08 – 2021-09-10 (×6): 5000 [IU] via SUBCUTANEOUS
  Filled 2021-09-08 (×6): qty 1

## 2021-09-08 MED ORDER — SODIUM CHLORIDE 0.9% FLUSH
3.0000 mL | Freq: Two times a day (BID) | INTRAVENOUS | Status: DC
  Administered 2021-09-09: 3 mL via INTRAVENOUS

## 2021-09-08 MED ORDER — SODIUM CHLORIDE 0.9 % IV SOLN: 250.0000 mL | INTRAVENOUS | Status: DC | PRN

## 2021-09-08 MED ORDER — SODIUM CHLORIDE 0.9% FLUSH
3.0000 mL | Freq: Two times a day (BID) | INTRAVENOUS | Status: DC
  Administered 2021-09-08 – 2021-09-09 (×4): 3 mL via INTRAVENOUS

## 2021-09-08 MED ORDER — CALCIUM CARBONATE 1250 (500 CA) MG PO TABS
1250.0000 mg | ORAL_TABLET | Freq: Two times a day (BID) | ORAL | Status: DC
  Administered 2021-09-09 – 2021-09-10 (×3): 1250 mg via ORAL
  Filled 2021-09-08 (×4): qty 1

## 2021-09-08 MED ORDER — FAMOTIDINE 20 MG PO TABS
20.0000 mg | ORAL_TABLET | Freq: Two times a day (BID) | ORAL | Status: DC
  Administered 2021-09-08 – 2021-09-09 (×3): 20 mg via ORAL
  Filled 2021-09-08 (×4): qty 1

## 2021-09-08 MED ORDER — HYDROCODONE-ACETAMINOPHEN 5-325 MG PO TABS: 1.0000 | ORAL_TABLET | ORAL | Status: DC | PRN

## 2021-09-08 NOTE — ED Provider Triage Note (Signed)
Emergency Medicine Provider Triage Evaluation Note  Patricia Stevenson , a 61 y.o. female  was evaluated in triage.  Pt complains of strokelike symptoms.  Patient states that for 1 month she has had alternating weakness on the right and left side.  She also endorses that she has had some drooping in her face on the right side and difficulty with saliva coming out of the right side of her mouth.  She states that additionally she feels like she has difficulty talking and has to "think a lot about walking or typing on the computer."  She also has had sensation difference on her face.  She states that she went to primary care yesterday, who performed CT head.  Review of Systems  Positive: See above Negative:   Physical Exam  BP (!) 144/81 (BP Location: Right Arm)    Pulse 70    Temp 98.9 F (37.2 C) (Oral)    Resp 14    SpO2 97%  Gen:   Awake, no distress   Resp:  Normal effort  Neuro:   Left-sided facial droop.  Sensation different on the right side of face.  She does have weakness to the left arm and leg.  She has mild left-sided pronator drift.  Visual fields intact.     Medical Decision Making  Medically screening exam initiated at 10:17 AM.  Appropriate orders placed.  Patricia Stevenson was informed that the remainder of the evaluation will be completed by another provider, this initial triage assessment does not replace that evaluation, and the importance of remaining in the ED until their evaluation is complete.  CT read from this morning: 3.4 cm in long axis hyperdense but somewhat indistinctly marginated mass like lesion along the right cerebral peduncle, right thalamus, and with potential involvement of the right mid brain. There is primarily vasogenic edema in the right temporal lobe and adjacent right parietal lobe and along adjacent white matter tracks along with effacement of part of the right suprasellar cistern, sylvian fissure, and temporal horn of the right lateral ventricle along with 6  mm of right to left midline shift of the third ventricle. More likely possibilities include mass lesions such as high-grade glioma, hemorrhagic infarct simulating a mass like appearance, or conceivably abscess. MRI brain with and without contrast is recommended for further characterization   Mickie Hillier, PA-C 09/08/21 1020

## 2021-09-08 NOTE — Telephone Encounter (Signed)
Called pt back no answer no vm

## 2021-09-08 NOTE — ED Notes (Signed)
Patient transported to MRI 

## 2021-09-08 NOTE — Telephone Encounter (Signed)
Report given to ED triage nurse per Dr Jerilee Hoh

## 2021-09-08 NOTE — ED Triage Notes (Signed)
Pt. Stated, my Dr. Durene Stevenson me here , I have something in my head , but my body not responding on the left side.  No arm drift, bilateral = grips.

## 2021-09-08 NOTE — ED Notes (Signed)
Report attempted x 1

## 2021-09-08 NOTE — Plan of Care (Signed)

## 2021-09-08 NOTE — H&P (Signed)
Patricia Stevenson is an 61 y.o. female.   Chief Complaint: Brain tumor HPI: 61 year old female without other medical problem.  She is admitted to the hospital with complaints of intermittent left-sided weakness including some intermittent facial droop on the left side.  The family has noted increasing emotional lability.  She has had some near syncopal spells.  She has had some tremulousness.  She has not had any obvious generalized seizure activity.  She denies any abnormal smells.  She has no history of fever or significant headache.  She has had some recent dental surgery and has dealt with some low-grade infection in her jaw but is had no obvious abscess.  She currently has no significant jaw pain.  She does note that her mastication has changed however and is frequently biting her tongue and lingual cheek.  She has no history of malignancy.  Past Medical History:  Diagnosis Date   Hypertension     Past Surgical History:  Procedure Laterality Date   BREAST BIOPSY Left    20 years ago in [place]- benign- not sure if bx or cyst    Family History  Problem Relation Age of Onset   Breast cancer Neg Hx    Social History:  reports that she has never smoked. She has never used smokeless tobacco. She reports that she does not drink alcohol and does not use drugs.  Allergies: No Known Allergies  (Not in a hospital admission)   Results for orders placed or performed during the hospital encounter of 09/08/21 (from the past 48 hour(s))  Comprehensive metabolic panel     Status: Abnormal   Collection Time: 09/08/21 10:34 AM  Result Value Ref Range   Sodium 135 135 - 145 mmol/L   Potassium 4.0 3.5 - 5.1 mmol/L   Chloride 105 98 - 111 mmol/L   CO2 25 22 - 32 mmol/L   Glucose, Bld 94 70 - 99 mg/dL    Comment: Glucose reference range applies only to samples taken after fasting for at least 8 hours.   BUN 16 6 - 20 mg/dL   Creatinine, Ser 0.53 0.44 - 1.00 mg/dL   Calcium 8.3 (L) 8.9 - 10.3 mg/dL    Total Protein 7.1 6.5 - 8.1 g/dL   Albumin 4.0 3.5 - 5.0 g/dL   AST 16 15 - 41 U/L   ALT 17 0 - 44 U/L   Alkaline Phosphatase 74 38 - 126 U/L   Total Bilirubin 0.4 0.3 - 1.2 mg/dL   GFR, Estimated >60 >60 mL/min    Comment: (NOTE) Calculated using the CKD-EPI Creatinine Equation (2021)    Anion gap 5 5 - 15    Comment: Performed at [facility] [address], [place], [place] [address]  CBC with Differential     Status: None   Collection Time: 09/08/21 10:34 AM  Result Value Ref Range   WBC 5.1 4.0 - 10.5 K/uL   RBC 4.27 3.87 - 5.11 MIL/uL   Hemoglobin 12.9 12.0 - 15.0 g/dL   HCT 39.8 36.0 - 46.0 %   MCV 93.2 80.0 - 100.0 fL   MCH 30.2 26.0 - 34.0 pg   MCHC 32.4 30.0 - 36.0 g/dL   RDW 13.1 11.5 - 15.5 %   Platelets 248 150 - 400 K/uL   nRBC 0.0 0.0 - 0.2 %   Neutrophils Relative % 59 %   Neutro Abs 3.0 1.7 - 7.7 K/uL   Lymphocytes Relative 33 %  Lymphs Abs 1.7 0.7 - 4.0 K/uL   Monocytes Relative 6 %   Monocytes Absolute 0.3 0.1 - 1.0 K/uL   Eosinophils Relative 1 %   Eosinophils Absolute 0.1 0.0 - 0.5 K/uL   Basophils Relative 1 %   Basophils Absolute 0.0 0.0 - 0.1 K/uL   Immature Granulocytes 0 %   Abs Immature Granulocytes 0.00 0.00 - 0.07 K/uL    Comment: Performed at [facility], [employer] [address], [place], [place] [address]  Protime-INR     Status: None   Collection Time: 09/08/21 10:34 AM  Result Value Ref Range   Prothrombin Time 12.6 11.4 - 15.2 seconds   INR 0.9 0.8 - 1.2    Comment: (NOTE) INR goal varies based on device and disease states. Performed at [facility], [place] [address], [place], [place] [address]   APTT     Status: None   Collection Time: 09/08/21 10:34 AM  Result Value Ref Range   aPTT 26 24 - 36 seconds    Comment: Performed at [facility] [address] [place], [place] [address]  I-Stat beta hCG blood, ED     Status: None   Collection Time: 09/08/21 10:40 AM  Result Value Ref Range   I-stat hCG,  quantitative <5.0 <5 mIU/mL   Comment 3            Comment:   GEST. AGE      CONC.  (mIU/mL)   <=1 WEEK        5 - 50     2 WEEKS       50 - 500     3 WEEKS       100 - 10,000     4 WEEKS     1,000 - 30,000        FEMALE AND NON-PREGNANT FEMALE:     LESS THAN 5 mIU/mL   CBG monitoring, ED     Status: None   Collection Time: 09/08/21 12:15 PM  Result Value Ref Range   Glucose-Capillary 90 70 - 99 mg/dL    Comment: Glucose reference range applies only to samples taken after fasting for at least 8 hours.   CT HEAD WO CONTRAST (5MM)  Result Date: 09/08/2021 CLINICAL DATA:  New onset left-sided weakness and right facial droop EXAM: CT HEAD WITHOUT CONTRAST TECHNIQUE: Contiguous axial images were obtained from the base of the skull through the vertex without intravenous contrast. RADIATION DOSE REDUCTION: This exam was performed according to the departmental dose-optimization program which includes automated exposure control, adjustment of the mA and/or kV according to patient size and/or use of iterative reconstruction technique. COMPARISON:  None. FINDINGS: Brain: Moderately hyperdense mass lesion in the right cerebral peduncle, midbrain, and thalamus measuring about 2.6 by 2.4 by 3.4 cm on image 33 series 4 and image 8 series 2. Adjacent substantial hypodensity particularly in white matter of the right temporal lobe and tracking in the posterior portion of the right internal capsule and right parietal lobe, primarily resembling vasogenic edema. There is about 6 mm of left-to-right midline shift at the level of the third ventricle on image 10 series 2. There is likely some effacement of the temporal horn of the right lateral ventricle, right sylvian fissure, and of the right suprasellar cistern. No overt hydrocephalus. Vascular: The hyperdense right-sided mass does partially abut the right cavernous sinus and right ICA although I am skeptical that this is an aneurysm based on its  contour. Skull:  Unremarkable Sinuses/Orbits: Unremarkable Other: No supplemental non-categorized findings. IMPRESSION: 1. 3.4 cm in long axis hyperdense but somewhat indistinctly marginated mass like lesion along the right cerebral peduncle, right thalamus, and with potential involvement of the right mid brain. There is primarily vasogenic edema in the right temporal lobe and adjacent right parietal lobe and along adjacent white matter tracks along with effacement of part of the right suprasellar cistern, sylvian fissure, and temporal horn of the right lateral ventricle along with 6 mm of right to left midline shift of the third ventricle. More likely possibilities include mass lesions such as high-grade glioma, hemorrhagic infarct simulating a mass like appearance, or conceivably abscess. MRI brain with and without contrast is recommended for further characterization. Critical Value/emergent results were called by telephone at the time of interpretation on 09/08/2021 at 9:31 am to provider Patricia Stevenson , who verbally acknowledged these results. The patient was imaged as an outpatient, and Patricia Stevenson is arranging for the patient to proceed immediately to the emergency department on an emergent basis. Electronically Signed   By: Van Clines M.D.   On: 09/08/2021 09:33   MR Brain W and Wo Contrast  Result Date: 09/08/2021 CLINICAL DATA:  Abnormal head CT.  Evaluate brain mass. EXAM: MRI HEAD WITHOUT AND WITH CONTRAST TECHNIQUE: Multiplanar, multiecho pulse sequences of the brain and surrounding structures were obtained without and with intravenous contrast. CONTRAST:  5.67mL GADAVIST GADOBUTROL 1 MMOL/ML IV SOLN COMPARISON:  CT head 09/08/2021 FINDINGS: Brain: Infiltrating mass lesion centered in the right medial temporal lobe. The mass infiltrates most of the right temporal lobe and extends into the insula. There is extension into the right lateral basal ganglia. There is also T2 hyperintensity in the right pons  and midbrain likely due to tumor infiltration. There is local mass-effect and 5 mm midline shift to the left. No associated hemorrhage. The mass shows a degree of restricted diffusion. The mass does not show significant enhancement. Ventricle size normal. 5 mm midline shift to the left. Negative for acute infarct. Vascular: Normal arterial flow voids. Skull and upper cervical spine: Negative Sinuses/Orbits: Paranasal sinuses clear.  Negative orbit Other: None IMPRESSION: Large infiltrating mass centered in the right medial temporal lobe. No associated enhancement or hemorrhage. Favor high-grade glioma such as glioblastoma. Electronically Signed   By: Franchot Gallo M.D.   On: 09/08/2021 13:34    Pertinent items noted in HPI and remainder of comprehensive ROS otherwise negative.  Blood pressure (!) 150/71, pulse 64, temperature 98.9 F (37.2 C), temperature source Oral, resp. rate 19, SpO2 100 %.  The patient is awake and alert.  She is oriented and appropriate.  Her speech is fluent.  Her judgment and insight appear intact.  Cranial nerve function demonstrates some mild left-sided central facial weakness.  Her tongue deviates slightly to the left.  Remainder of her cranial nerve function appears intact.  Motor examination of her extremities reveals a subtle left-sided pronator drift.  Otherwise motor strength intact.  Sensory examination is nonfocal.  Examination head ears eyes nose and throat is unremarkable.  Chest and abdomen are benign.  Extremities are free from injury or deformity. Assessment/Plan Right-sided mesial temporal mass which is hyperdense on CT scan and does not enhance with contrast on MRI scanning.  There is significant early compression of her upper brainstem and some local vasogenic edema.  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  I discussed situation with the patient and  her family.  We will admit to the hospital and continue treatment  with IV Decadron.  I will discuss things further with Patricia Stevenson with regard to possible surgical resection.  Patricia Stevenson 09/08/2021, 2:48 PM

## 2021-09-08 NOTE — ED Triage Notes (Signed)
Pt. Stated, I fell DEc. 24 and hit the back of her head. No stitches.

## 2021-09-08 NOTE — ED Provider Notes (Signed)
[facility] EMERGENCY DEPARTMENT Provider Note  CSN: [id number] Arrival date & time: 09/08/21 4696  Chief Complaint(s) No chief complaint on file.  HPI Patricia Stevenson is a 61 y.o. female with PMH HTN, HLD who presents emergency department for evaluation of an abnormal CT.  Patient states that for approximately 1 month she has had bilateral upper extremity weakness left greater than right.  She states that she is also had intermittent word finding difficulty and intermittent difficulty controlling the facial muscles.  She currently endorses mild numbness on the left side of the face.  The patient received a CT scan this morning ordered by her primary care physician and after seeing the scan the patient was referred urgently to the emergency department for an MRI.  Review of the scan shows a possible new brain mass.  Patient arrives alert and oriented answering all questions appropriately with complaints of active left-sided weakness that is mild but no chest pain, shortness of breath, abdominal pain, nausea, vomiting or other systemic symptoms.  HPI  Past Medical History Past Medical History:  Diagnosis Date   Hypertension    Patient Active Problem List   Diagnosis Date Noted   Brain tumor ([place]) 09/08/2021   Hyperlipidemia 05/23/2020   Vitamin D deficiency 05/23/2020   AMENORRHEA 03/13/2009   Home Medication(s) Prior to Admission medications   Medication Sig Start Date End Date Taking? Authorizing Provider  acetaminophen (TYLENOL) 325 MG tablet Take 650 mg by mouth every 6 (six) hours as needed for moderate pain.   Yes [provider]  Ascorbic Acid (VITA-C PO) Take 1 tablet by mouth daily.   Yes [provider]  calcium carbonate (OSCAL) 1500 (600 Ca) MG TABS tablet Take by mouth 2 (two) times daily with a meal.   Yes [provider]  cholecalciferol (VITAMIN D3) 25 MCG (1000 UNIT) tablet Take 1,000 Units by mouth daily.   Yes [provider]  famotidine (PEPCID) 20 MG tablet Take 1 tablet (20 mg total) by mouth 2 (two) times daily. 09/03/21  Yes Nandigam, Venia Minks, MD  dicyclomine (BENTYL) 10 MG capsule Take 1 capsule (10 mg total) by mouth in the morning and at bedtime. As needed Patient not taking: Reported on 09/08/2021 09/03/21   Mauri Pole, MD                                                                                                                                    Past Surgical History Past Surgical History:  Procedure Laterality Date   BREAST BIOPSY Left    20 years ago in [place]- benign- not sure if bx or cyst   Family History Family History  Problem Relation Age of Onset   Breast cancer Neg Hx     Social History Social History   Tobacco Use   Smoking status: Never   Smokeless tobacco: Never  Substance Use Topics  Alcohol use: No   Drug use: No   Allergies Patient has no known allergies.  Review of Systems Review of Systems  Neurological:  Positive for facial asymmetry, speech difficulty, weakness and numbness.   Physical Exam Vital Signs  I have reviewed the triage vital signs BP (!) 150/71    Pulse 64    Temp 98.9 F (37.2 C) (Oral)    Resp 19    SpO2 100%   Physical Exam Vitals and nursing note reviewed.  Constitutional:      General: She is not in acute distress.    Appearance: She is well-developed.  HENT:     Head: Normocephalic and atraumatic.  Eyes:     Conjunctiva/sclera: Conjunctivae normal.  Cardiovascular:     Rate and Rhythm: Normal rate and regular rhythm.     Heart sounds: No murmur heard. Pulmonary:     Effort: Pulmonary effort is normal. No respiratory distress.     Breath sounds: Normal breath sounds.  Abdominal:     Palpations: Abdomen is soft.     Tenderness: There is no abdominal tenderness.  Musculoskeletal:        General: No swelling.     Cervical back: Neck supple.  Skin:    General: Skin is warm and dry.     Capillary Refill:  Capillary refill takes less than 2 seconds.  Neurological:     Mental Status: She is alert.     Sensory: Sensory deficit (V1 numbness) present.     Motor: Weakness (Left upper and left lower extremity) present.  Psychiatric:        Mood and Affect: Mood normal.    ED Results and Treatments Labs (all labs ordered are listed, but only abnormal results are displayed) Labs Reviewed  COMPREHENSIVE METABOLIC PANEL - Abnormal; Notable for the following components:      Result Value   Calcium 8.3 (*)    All other components within normal limits  CBC WITH DIFFERENTIAL/PLATELET  PROTIME-INR  APTT  HIV ANTIBODY (ROUTINE TESTING W REFLEX)  CBC  CREATININE, SERUM  I-STAT BETA HCG BLOOD, ED ([facility], [facility], [facility] ONLY)  CBG MONITORING, ED                                                                                                                          Radiology CT HEAD WO CONTRAST (5MM)  Result Date: 09/08/2021 CLINICAL DATA:  New onset left-sided weakness and right facial droop EXAM: CT HEAD WITHOUT CONTRAST TECHNIQUE: Contiguous axial images were obtained from the base of the skull through the vertex without intravenous contrast. RADIATION DOSE REDUCTION: This exam was performed according to the departmental dose-optimization program which includes automated exposure control, adjustment of the mA and/or kV according to patient size and/or use of iterative reconstruction technique. COMPARISON:  None. FINDINGS: Brain: Moderately hyperdense mass lesion in the right cerebral peduncle, midbrain, and thalamus measuring about 2.6 by 2.4 by 3.4 cm on image 33 series 4 and  image 8 series 2. Adjacent substantial hypodensity particularly in white matter of the right temporal lobe and tracking in the posterior portion of the right internal capsule and right parietal lobe, primarily resembling vasogenic edema. There is about 6 mm of left-to-right midline shift at the level of the third ventricle on image 10 series  2. There is likely some effacement of the temporal horn of the right lateral ventricle, right sylvian fissure, and of the right suprasellar cistern. No overt hydrocephalus. Vascular: The hyperdense right-sided mass does partially abut the right cavernous sinus and right ICA although I am skeptical that this is an aneurysm based on its contour. Skull: Unremarkable Sinuses/Orbits: Unremarkable Other: No supplemental non-categorized findings. IMPRESSION: 1. 3.4 cm in long axis hyperdense but somewhat indistinctly marginated mass like lesion along the right cerebral peduncle, right thalamus, and with potential involvement of the right mid brain. There is primarily vasogenic edema in the right temporal lobe and adjacent right parietal lobe and along adjacent white matter tracks along with effacement of part of the right suprasellar cistern, sylvian fissure, and temporal horn of the right lateral ventricle along with 6 mm of right to left midline shift of the third ventricle. More likely possibilities include mass lesions such as high-grade glioma, hemorrhagic infarct simulating a mass like appearance, or conceivably abscess. MRI brain with and without contrast is recommended for further characterization. Critical Value/emergent results were called by telephone at the time of interpretation on 09/08/2021 at 9:31 am to provider [facility] , who verbally acknowledged these results. The patient was imaged as an outpatient, and Dr. Deniece Ree is arranging for the patient to proceed immediately to the emergency department on an emergent basis. Electronically Signed   By: Van Clines M.D.   On: 09/08/2021 09:33   MR Brain W and Wo Contrast  Result Date: 09/08/2021 CLINICAL DATA:  Abnormal head CT.  Evaluate brain mass. EXAM: MRI HEAD WITHOUT AND WITH CONTRAST TECHNIQUE: Multiplanar, multiecho pulse sequences of the brain and surrounding structures were obtained without and with intravenous contrast. CONTRAST:   5.21mL GADAVIST GADOBUTROL 1 MMOL/ML IV SOLN COMPARISON:  CT head 09/08/2021 FINDINGS: Brain: Infiltrating mass lesion centered in the right medial temporal lobe. The mass infiltrates most of the right temporal lobe and extends into the insula. There is extension into the right lateral basal ganglia. There is also T2 hyperintensity in the right pons and midbrain likely due to tumor infiltration. There is local mass-effect and 5 mm midline shift to the left. No associated hemorrhage. The mass shows a degree of restricted diffusion. The mass does not show significant enhancement. Ventricle size normal. 5 mm midline shift to the left. Negative for acute infarct. Vascular: Normal arterial flow voids. Skull and upper cervical spine: Negative Sinuses/Orbits: Paranasal sinuses clear.  Negative orbit Other: None IMPRESSION: Large infiltrating mass centered in the right medial temporal lobe. No associated enhancement or hemorrhage. Favor high-grade glioma such as glioblastoma. Electronically Signed   By: Franchot Gallo M.D.   On: 09/08/2021 13:34    Pertinent labs & imaging results that were available during my care of the patient were reviewed by me and considered in my medical decision making (see MDM for details).  Medications Ordered in ED Medications  dicyclomine (BENTYL) capsule 10 mg (has no administration in time range)  famotidine (PEPCID) tablet 20 mg (has no administration in time range)  vitamin C (ASCORBIC ACID) tablet (has no administration in time range)  calcium carbonate (OS-CAL - dosed in mg  of elemental calcium) tablet 1,250 mg (has no administration in time range)  cholecalciferol (VITAMIN D3) tablet 1,000 Units (has no administration in time range)  heparin injection 5,000 Units (has no administration in time range)  sodium chloride flush (NS) 0.9 % injection 3 mL (has no administration in time range)  sodium chloride flush (NS) 0.9 % injection 3 mL (has no administration in time range)   sodium chloride flush (NS) 0.9 % injection 3 mL (has no administration in time range)  0.9 %  sodium chloride infusion (has no administration in time range)  acetaminophen (TYLENOL) tablet 650 mg (has no administration in time range)    Or  acetaminophen (TYLENOL) suppository 650 mg (has no administration in time range)  HYDROcodone-acetaminophen (NORCO/VICODIN) 5-325 MG per tablet 1-2 tablet (has no administration in time range)  docusate sodium (COLACE) capsule 100 mg (has no administration in time range)  metoprolol tartrate (LOPRESSOR) injection 5 mg (has no administration in time range)  levETIRAcetam (KEPPRA) tablet 500 mg (has no administration in time range)  dexamethasone (DECADRON) injection 4 mg (has no administration in time range)  gadobutrol (GADAVIST) 1 MMOL/ML injection 5.5 mL (5.5 mLs Intravenous Contrast Given 09/08/21 1324)  dexamethasone (DECADRON) injection 10 mg (10 mg Intravenous Given 09/08/21 1351)                                                                                                                                     Procedures Procedures  (including critical care time)  Medical Decision Making / ED Course   This patient presents to the ED for concern of abnormal CT, upper and lower extremity weakness, this involves an extensive number of treatment options, and is a complaint that carries with it a high risk of complications and morbidity.  The differential diagnosis includes brain mass, CVA, vasculitis  MDM: Patient seen the emergency department for evaluation of an abnormal CT.  Physical exam reveals left upper and lower extremity weakness but is otherwise unremarkable.  Laboratory evaluation unremarkable.  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  Dr. Trenton Gammon of neurosurgery was consulted who recommended steroids and admission to his service.  Dr. Trenton Gammon then came and saw the patient at bedside and  admitted the patient.   Additional history obtained: -Additional history obtained from multiple family members -External records from outside source obtained and reviewed including: Chart review including previous notes, labs, imaging, consultation notes   Lab Tests: -I ordered, reviewed, and interpreted labs.   The pertinent results include:   Labs Reviewed  COMPREHENSIVE METABOLIC PANEL - Abnormal; Notable for the following components:      Result Value   Calcium 8.3 (*)    All other components within normal limits  CBC WITH DIFFERENTIAL/PLATELET  PROTIME-INR  APTT  HIV ANTIBODY (ROUTINE TESTING W REFLEX)  CBC  CREATININE, SERUM  I-STAT BETA HCG BLOOD,  ED ([facility], [facility], [facility] ONLY)  CBG MONITORING, ED       Imaging Studies ordered: I ordered imaging studies including MRI brain I independently visualized and interpreted imaging. I agree with the radiologist interpretation   Medicines ordered and prescription drug management: Meds ordered this encounter  Medications   gadobutrol (GADAVIST) 1 MMOL/ML injection 5.5 mL   dexamethasone (DECADRON) injection 10 mg   dicyclomine (BENTYL) capsule 10 mg    As needed     famotidine (PEPCID) tablet 20 mg   vitamin C (ASCORBIC ACID) tablet   calcium carbonate (OS-CAL - dosed in mg of elemental calcium) tablet 1,250 mg   cholecalciferol (VITAMIN D3) tablet 1,000 Units   heparin injection 5,000 Units   sodium chloride flush (NS) 0.9 % injection 3 mL   sodium chloride flush (NS) 0.9 % injection 3 mL   sodium chloride flush (NS) 0.9 % injection 3 mL   0.9 %  sodium chloride infusion   OR Linked Order Group    acetaminophen (TYLENOL) tablet 650 mg    acetaminophen (TYLENOL) suppository 650 mg   HYDROcodone-acetaminophen (NORCO/VICODIN) 5-325 MG per tablet 1-2 tablet   docusate sodium (COLACE) capsule 100 mg   metoprolol tartrate (LOPRESSOR) injection 5 mg   levETIRAcetam (KEPPRA) tablet 500 mg   dexamethasone (DECADRON) injection 4 mg     -I have reviewed the patients home medicines and have made adjustments as needed   Consultations Obtained: I requested consultation with the Neurosurgery team,  and discussed lab and imaging findings as well as pertinent plan - they recommend: steroids, admission   Cardiac Monitoring: The patient was maintained on a cardiac monitor.  I personally viewed and interpreted the cardiac monitored which showed an underlying rhythm of: Normal sinus rhythm  Social Determinants of Health:  Factors impacting patients care include: English second language   Reevaluation: After the interventions noted above, I reevaluated the patient and found that they have :stayed the same  Co morbidities that complicate the patient evaluation  Past Medical History:  Diagnosis Date   Hypertension       Dispostion: admit     Final Clinical Impression(s) / ED Diagnoses Final diagnoses:  Brain mass     @PCDICTATION @    Teressa Lower, MD 09/08/21 1544

## 2021-09-09 ENCOUNTER — Inpatient Hospital Stay (HOSPITAL_COMMUNITY): Payer: Self-pay

## 2021-09-09 LAB — HIV ANTIBODY (ROUTINE TESTING W REFLEX): HIV Screen 4th Generation wRfx: NONREACTIVE

## 2021-09-09 IMAGING — MR MR HEAD W/O CM
13 of 14 series · 40 of 48 positions shown · non-contrast
Comparison: Prior MRI from [DATE].

CLINICAL DATA: Follow-up examination for CNS neoplasm, [facility]
protocol for surgical planning.

EXAM:
MRI HEAD WITHOUT CONTRAST
TECHNIQUE: Multiplanar, multiecho pulse sequences of the brain and surrounding
structures were obtained without intravenous contrast.

[Series 2: FLAIR · sagittal · 3.0mm · 0.47mm/px · 1 of 47 slices shown]
[im 1/47]
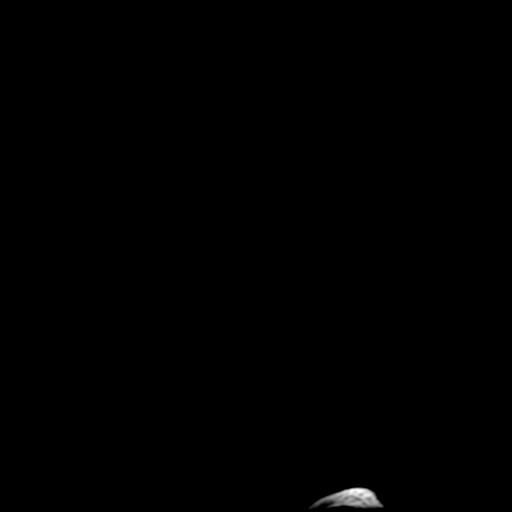

[Series 3: T2 · axial · 5.0mm · 0.47mm/px · 1 of 30 slices shown (1 of 2)]
[im 1/30]
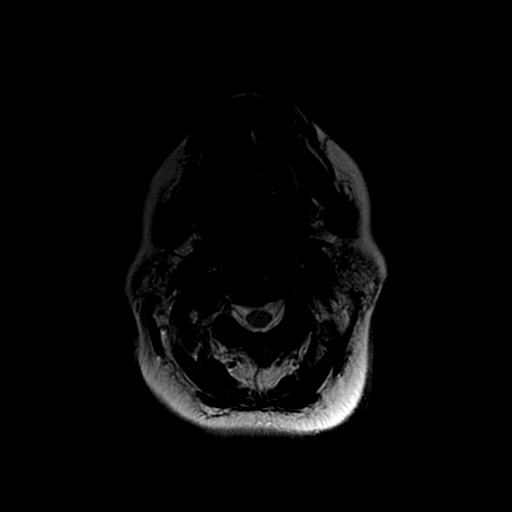

[Series 5: ax dti · axial · 3.0mm · 0.94mm/px · z∈[-112,+60]mm · 15 of 1534 slices shown]
[im 1/1534]
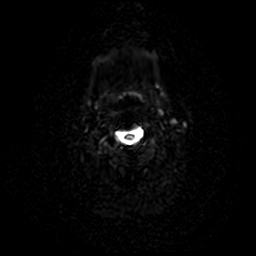
[im 110/1534]
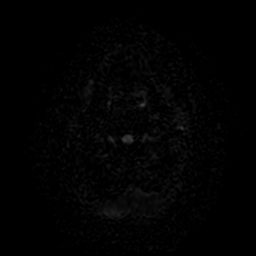
[im 220/1534]
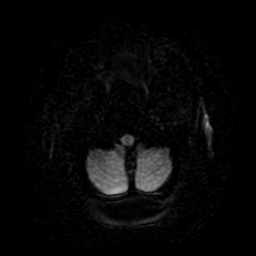
[im 329/1534]
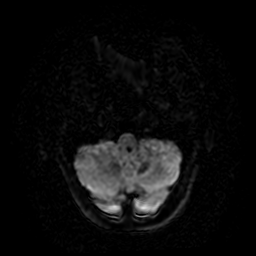
[im 439/1534]
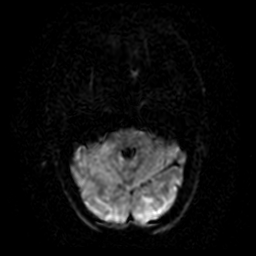
[im 548/1534]
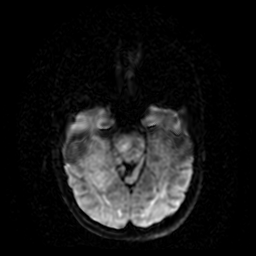
[im 658/1534]
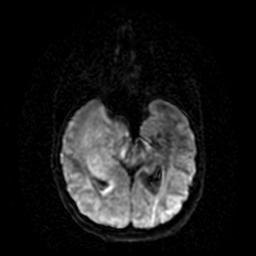
[im 767/1534]
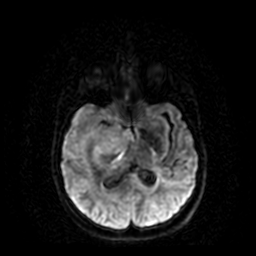
[im 877/1534]
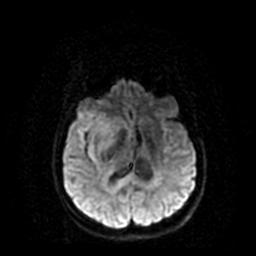
[im 986/1534]
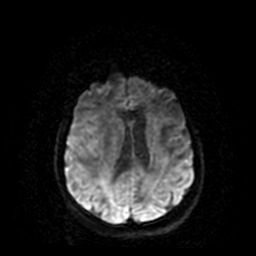
[im 1096/1534]
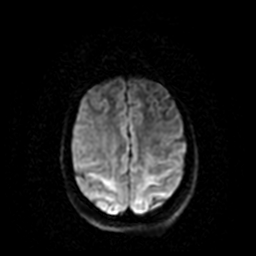
[im 1205/1534]
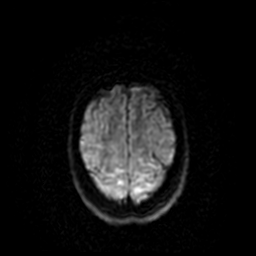
[im 1315/1534]
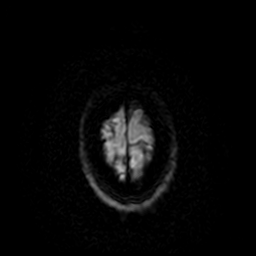
[im 1424/1534]
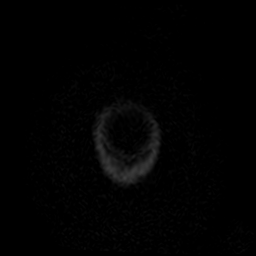
[im 1534/1534]
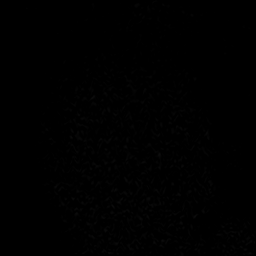

[Series 6: ax 3(person_name) · axial · 1.0mm · 1.02mm/px · 1 of 194 slices shown]
[im 1/194]
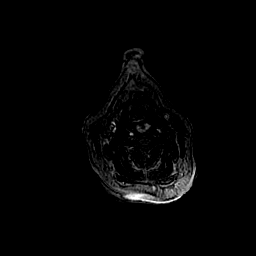

[Series 7: (person_name) · axial · 3.0mm · 0.47mm/px · 1 of 112 slices shown]
[im 1/112]
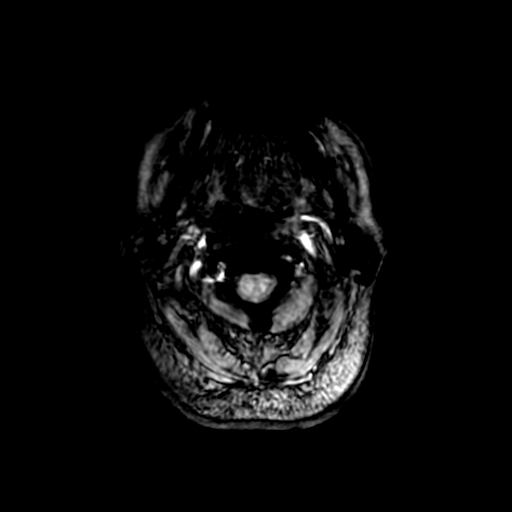

[Series 8: T2 · coronal · 3.0mm · 0.39mm/px · 1 of 76 slices shown (2 of 2)]
[im 1/76]
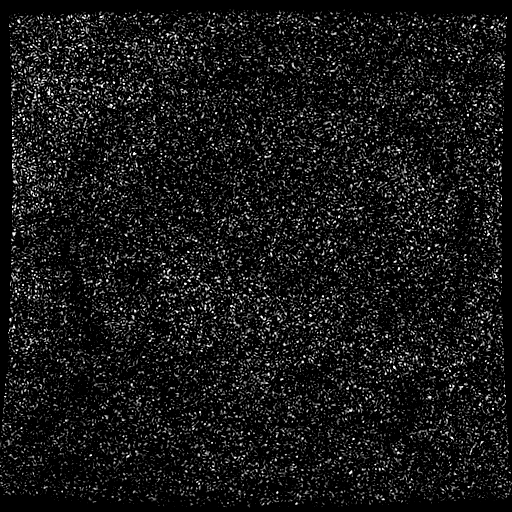

[Series 400: multiplanar reconstruction (mpr) · axial · 1.0mm · 0.50mm/px · z∈[-146,+110]mm · 2 of 257 slices shown (1 of 2)]
[im 1/257]
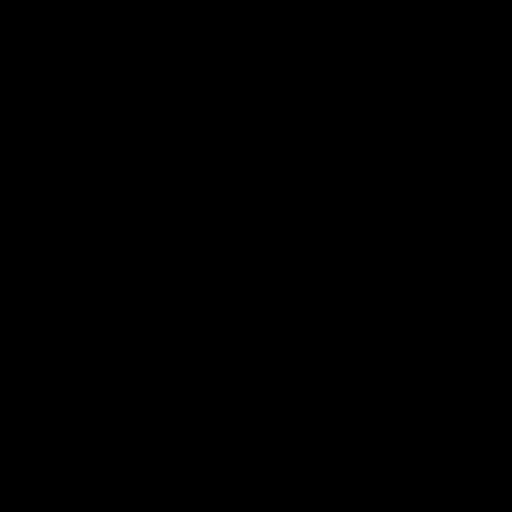
[im 257/257]
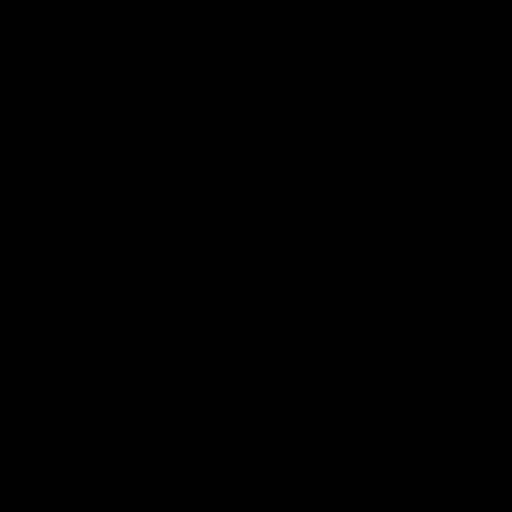

[Series 401: multiplanar reconstruction (mpr) · coronal · 1.0mm · 0.50mm/px · 3 of 244 slices shown (2 of 2)]
[im 1/244]
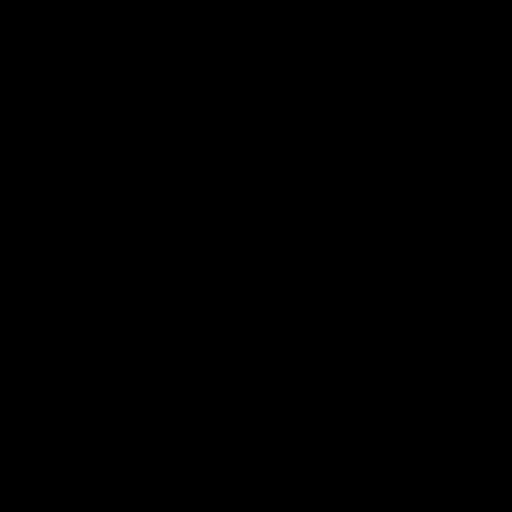
[im 122/244]
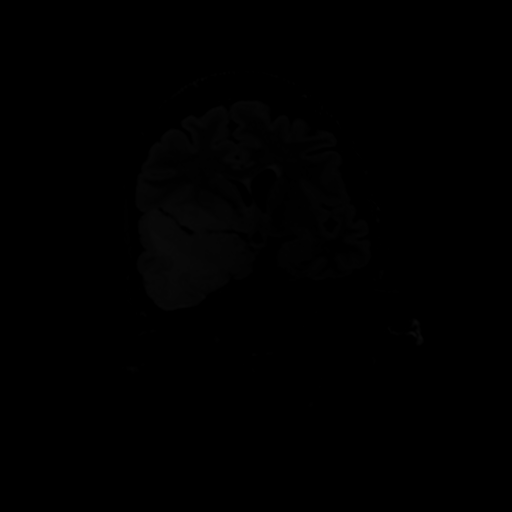
[im 244/244]
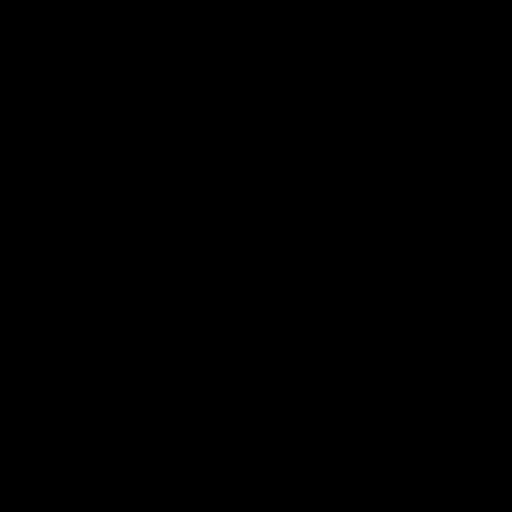

[Series 510: orig: ax dti · axial · 3.0mm · 0.94mm/px · z∈[-112,+60]mm · 11 of 1534 slices shown]
[im 1/1534]
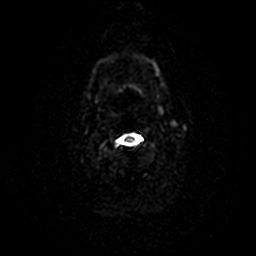
[im 103/1534]
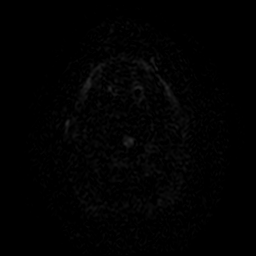
[im 205/1534]
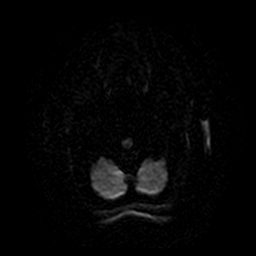
[im 307/1534]
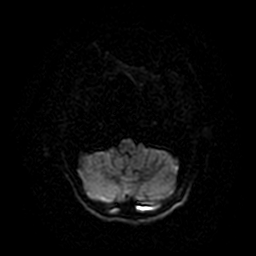
[im 409/1534]
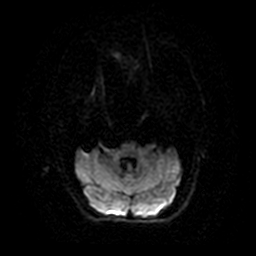
[im 512/1534]
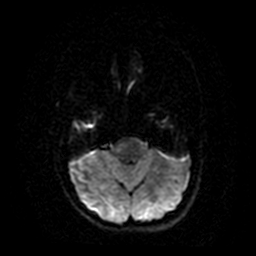
[im 716/1534]
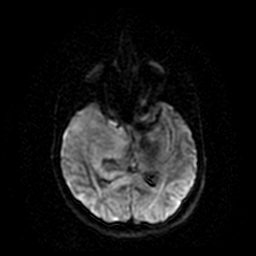
[im 920/1534]
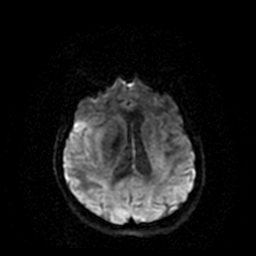
[im 1125/1534]
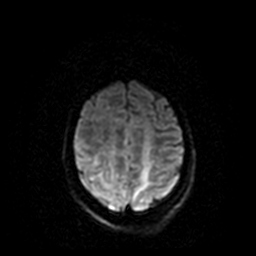
[im 1329/1534]
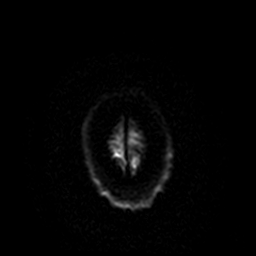
[im 1534/1534]
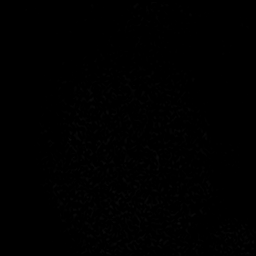

[Series 550: trace · axial · 3.0mm · 0.94mm/px · 1 of 57 slices shown]
[im 1/57]
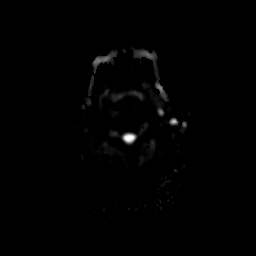

[Series 551: fa(no-q) · axial · 3.0mm · 0.94mm/px · 1 of 57 slices shown]
[im 1/57]
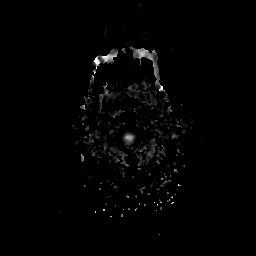

[Series 552: avdc (10^-6 mm²/s)(no-q) · axial · 3.0mm · 0.94mm/px · 1 of 57 slices shown]
[im 1/57]
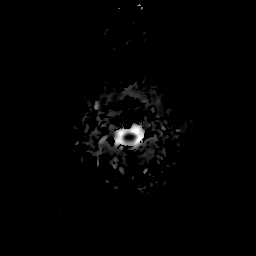

[Series 700: filt_pha: (person_name) · axial · 3.0mm · 0.47mm/px · 1 of 111 slices shown]
[im 1/111]
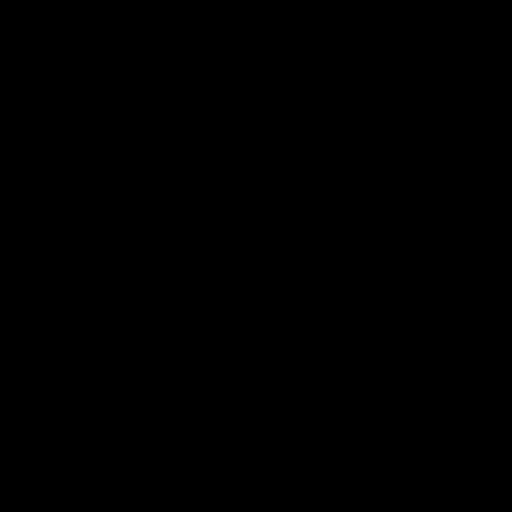

[40 of 48 positions shown; findings below may reference images not displayed]

FINDINGS: Brain: Again seen is a large infiltrating mass centered at the
mesial right temporal lobe. Mass again seen to infiltrate the insula
and basal ganglia, with extension into the right cerebral peduncle,
midbrain, and pons. No associated hemorrhage. Associated regional
mass effect with partial effacement of the right lateral ventricle
and 5 mm of right-to-left shift. No hydrocephalus or trapping.
Basilar cistern crowding without effacement. Appearance is stable
from prior.

Vascular: Major intracranial vascular flow voids are maintained.
Right MCA is partially encased by the right temporal mass.

Skull and upper cervical spine: Craniocervical junction normal. Bone
marrow signal intensity within normal limits. No scalp soft tissue
abnormality.

Sinuses/Orbits: Globes and orbital soft tissues demonstrate no acute
finding. Paranasal sinuses and mastoid air cells remain clear.

Other: None.
IMPRESSION: Stable size and appearance of infiltrating mass centered at the
mesial right temporal lobe with associated regional mass effect and
5 mm of right-to-left shift. Examination will be used for surgical
planning purposes.

## 2021-09-09 NOTE — Discharge Summary (Signed)
I had a long discussion with the patient with the [employer] interpreter translating.  The planned surgery of right temporal lobectomy for tumor was discussed, including the general technique, risks, benefits, alternatives, and expected convalescence.  Risks discussed included, but were not limited to, bleeding, pain, infection, seizure, scar, stroke, neurologic deficit, extraocular motility deficit, vision deficits, coma, and death.  Informed consent was obtained.  Will plan for stealth MRI for preop planning and likely DC tomorrow morning.

## 2021-09-09 NOTE — Progress Notes (Signed)
Subjective: Patient reports no headache, some clumsiness in left hand  Objective: Vital signs in last 24 hours: Temp:  [97.5 F (36.4 C)-98.3 F (36.8 C)] 98.3 F (36.8 C) (01/18 1102) Pulse Rate:  [59-77] 74 (01/18 1102) Resp:  [14-19] 14 (01/18 1102) BP: (114-152)/(66-98) 127/74 (01/18 1102) SpO2:  [97 %-100 %] 99 % (01/18 1102) Weight:  [55.1 kg] 55.1 kg (01/17 2130)  Intake/Output from previous day: No intake/output data recorded. Intake/Output this shift: Total I/O In: 240 [P.O.:240] Out: -   Awake, alert, Ox3 Speech fluent in [place] and [place] Short term recall of 3 objects appears intact grossly FC x 4 with left sided pronator drift EOMI, PERRL, no facial droop  Lab Results: Recent Labs    09/08/21 1034 09/08/21 1745  WBC 5.1 6.3  HGB 12.9 13.1  HCT 39.8 40.5  PLT 248 220   BMET Recent Labs    09/08/21 1034 09/08/21 1745  NA 135  --   K 4.0  --   CL 105  --   CO2 25  --   GLUCOSE 94  --   BUN 16  --   CREATININE 0.53 0.62  CALCIUM 8.3*  --     Studies/Results: CT HEAD WO CONTRAST (5MM)  Result Date: 09/08/2021 CLINICAL DATA:  New onset left-sided weakness and right facial droop EXAM: CT HEAD WITHOUT CONTRAST TECHNIQUE: Contiguous axial images were obtained from the base of the skull through the vertex without intravenous contrast. RADIATION DOSE REDUCTION: This exam was performed according to the departmental dose-optimization program which includes automated exposure control, adjustment of the mA and/or kV according to patient size and/or use of iterative reconstruction technique. COMPARISON:  None. FINDINGS: Brain: Moderately hyperdense mass lesion in the right cerebral peduncle, midbrain, and thalamus measuring about 2.6 by 2.4 by 3.4 cm on image 33 series 4 and image 8 series 2. Adjacent substantial hypodensity particularly in white matter of the right temporal lobe and tracking in the posterior portion of the right internal capsule and right  parietal lobe, primarily resembling vasogenic edema. There is about 6 mm of left-to-right midline shift at the level of the third ventricle on image 10 series 2. There is likely some effacement of the temporal horn of the right lateral ventricle, right sylvian fissure, and of the right suprasellar cistern. No overt hydrocephalus. Vascular: The hyperdense right-sided mass does partially abut the right cavernous sinus and right ICA although I am skeptical that this is an aneurysm based on its contour. Skull: Unremarkable Sinuses/Orbits: Unremarkable Other: No supplemental non-categorized findings. IMPRESSION: 1. 3.4 cm in long axis hyperdense but somewhat indistinctly marginated mass like lesion along the right cerebral peduncle, right thalamus, and with potential involvement of the right mid brain. There is primarily vasogenic edema in the right temporal lobe and adjacent right parietal lobe and along adjacent white matter tracks along with effacement of part of the right suprasellar cistern, sylvian fissure, and temporal horn of the right lateral ventricle along with 6 mm of right to left midline shift of the third ventricle. More likely possibilities include mass lesions such as high-grade glioma, hemorrhagic infarct simulating a mass like appearance, or conceivably abscess. MRI brain with and without contrast is recommended for further characterization. Critical Value/emergent results were called by telephone at the time of interpretation on 09/08/2021 at 9:31 am to provider [facility] , who verbally acknowledged these results. The patient was imaged as an outpatient, and Dr. Deniece Ree is arranging for the patient to proceed  immediately to the emergency department on an emergent basis. Electronically Signed   By: Van Clines M.D.   On: 09/08/2021 09:33   MR Brain W and Wo Contrast  Result Date: 09/08/2021 CLINICAL DATA:  Abnormal head CT.  Evaluate brain mass. EXAM: MRI HEAD WITHOUT AND WITH  CONTRAST TECHNIQUE: Multiplanar, multiecho pulse sequences of the brain and surrounding structures were obtained without and with intravenous contrast. CONTRAST:  5.58mL GADAVIST GADOBUTROL 1 MMOL/ML IV SOLN COMPARISON:  CT head 09/08/2021 FINDINGS: Brain: Infiltrating mass lesion centered in the right medial temporal lobe. The mass infiltrates most of the right temporal lobe and extends into the insula. There is extension into the right lateral basal ganglia. There is also T2 hyperintensity in the right pons and midbrain likely due to tumor infiltration. There is local mass-effect and 5 mm midline shift to the left. No associated hemorrhage. The mass shows a degree of restricted diffusion. The mass does not show significant enhancement. Ventricle size normal. 5 mm midline shift to the left. Negative for acute infarct. Vascular: Normal arterial flow voids. Skull and upper cervical spine: Negative Sinuses/Orbits: Paranasal sinuses clear.  Negative orbit Other: None IMPRESSION: Large infiltrating mass centered in the right medial temporal lobe. No associated enhancement or hemorrhage. Favor high-grade glioma such as glioblastoma. Electronically Signed   By: Franchot Gallo M.D.   On: 09/08/2021 13:34    Assessment/Plan: 61 yo F with infiltrative lesion of right temporal lobe, with bulk of disease in anterior superior, middle, inferior gyri as well as in mesial temporal lobe, though hippocampus does not grossly appear involved. There is infiltrative disease in right insula, internal capsule, lateral perforated substance, and extension into right midbrain and pons.  I had a long discussion with the patient as well as her daughter via the phone who recently completed her MD/PhD.    Primary brain tumor such as glioma is the main suspicion,.  I have recommended resection of  the bulky masslike portion of her tumor which would involve a right temporal lobectomy.  I would not plan to resect the infiltrative disease  involving her more eloquent  regions of the brain.  I will plan for the surgery next Tuesday.  Clinically, she appears to be doing fairly well so it would be reasonable to discharge her later today with steroids and Keppra.  All questions and concerns were answered.   Patricia Stevenson 09/09/2021, 1:36 PM

## 2021-09-09 NOTE — Progress Notes (Signed)
Overall stable.  No headaches.  Left sided weakness somewhat improved.  No symptoms consistent with seizure activity.  Patient states that she met with Dr. Marcello Moores today.  Looking toward possible surgery on Tuesday.  I will discuss things further with Dr. Marcello Moores and possibly consider discharge later today or tomorrow.

## 2021-09-09 NOTE — Telephone Encounter (Signed)
Currently admitted.

## 2021-09-10 MED ORDER — DEXAMETHASONE 4 MG PO TABS: 4.0000 mg | ORAL_TABLET | Freq: Three times a day (TID) | ORAL | 0 refills | Status: DC

## 2021-09-10 MED ORDER — COVID-19MRNA BIVAL VACC PFIZER 30 MCG/0.3ML IM SUSP
0.3000 mL | Freq: Once | INTRAMUSCULAR | Status: AC
  Administered 2021-09-10: 0.3 mL via INTRAMUSCULAR
  Filled 2021-09-10: qty 0.3

## 2021-09-10 MED ORDER — LEVETIRACETAM 500 MG PO TABS: 500.0000 mg | ORAL_TABLET | Freq: Two times a day (BID) | ORAL | 2 refills | Status: DC

## 2021-09-10 NOTE — TOC Transition Note (Signed)
Transition of Care [facility]) - CM/SW Discharge Note   Patient Details  Name: Patricia Stevenson MRN: [id number] Date of Birth: [birth year]  [facility] CM/SW Contact:  Verdell Carmine, RN Phone Number: 09/10/2021, 10:15 AM   Clinical Narrative:    Discussed with patient daughter in regards to medication assistance. She states that the patient just got insurance prior to hospitalization, however has not received cards yet.   Patient is employed and after reviewing cost of medications with good RX, can afford her medications.  Patient to DC   Final next level of care: Home/Self Care Barriers to Discharge: No Barriers Identified   Patient Goals and CMS Choice        Discharge Placement               Home self care        Discharge Plan and Services                                     Social Determinants of Health (SDOH) Interventions     Readmission Risk Interventions No flowsheet data found.

## 2021-09-10 NOTE — Discharge Summary (Signed)
Physician Discharge Summary  Patient ID: Patricia Stevenson MRN: [id number] DOB/AGE: 11-29-1960 61 y.o.  Admit date: 09/08/2021 Discharge date: 09/10/2021  Admission Diagnoses: right temporal tumor  Discharge Diagnoses: right temporal tumor Principal Problem:   Brain tumor [facility])   Discharged Condition: good  Hospital Course: The patient was admitted to the hospital with complaints of intermittent left-sided weakness including some intermittent facial droop on the left side. CT imaging revealed an infiltrative lesion of right temporal lobe. She was treated with IV Decadron. It was recommended that she undergo resection of  the bulky masslike portion of her tumor which would involve a right temporal lobectomy. Surgery is planned for Tuesday. Clinically, she appears to be doing fairly well so it is reasonable to discharge her today with steroids and Keppra. She will return on Tuesday for her surgery.    Consults: None  Significant Diagnostic Studies: radiology: MRI: Brain and CT scan: Head  Treatments: steroids: Dexamethasone   Discharge Exam: Blood pressure (!) 146/63, pulse (!) 58, temperature 98.5 F (36.9 C), temperature source Oral, resp. rate 18, height 5\' 2"  (1.575 m), weight 55.1 kg, SpO2 99 %. Physical Exam: Patient is awake, A/O X 4, conversant, and in good spirits. They are in NAD and VSS. Doing well. Follows all commands without difficulty. Speech is fluent and appropriate. MAEW. LUE pronator drift. Sensation to light touch is intact. PERLA, EOMI. CNs grossly intact.     Disposition: Discharge disposition: 01-Home or Self Care        Allergies as of 09/10/2021   No Known Allergies      Medication List     TAKE these medications    acetaminophen 325 MG tablet Commonly known as: TYLENOL Take 650 mg by mouth every 6 (six) hours as needed for moderate pain.   calcium carbonate 1500 (600 Ca) MG Tabs tablet Commonly known as: OSCAL Take by mouth 2 (two) times daily  with a meal.   cholecalciferol 25 MCG (1000 UNIT) tablet Commonly known as: VITAMIN D3 Take 1,000 Units by mouth daily.   dexamethasone 4 MG tablet Commonly known as: DECADRON Take 1 tablet (4 mg total) by mouth 3 (three) times daily.   dicyclomine 10 MG capsule Commonly known as: BENTYL Take 1 capsule (10 mg total) by mouth in the morning and at bedtime. As needed   famotidine 20 MG tablet Commonly known as: Pepcid Take 1 tablet (20 mg total) by mouth 2 (two) times daily.   levETIRAcetam 500 MG tablet Commonly known as: Keppra Take 1 tablet (500 mg total) by mouth 2 (two) times daily.   VITA-C PO Take 1 tablet by mouth daily.         Signed: Marvis Moeller, DNP, NP-C 09/10/2021, 9:18 AM

## 2021-09-14 ENCOUNTER — Other Ambulatory Visit: Payer: Self-pay

## 2021-09-14 NOTE — Progress Notes (Signed)
Attempted pre-op call x2 today. Called patient via interpreter this morning and again this afternoon. No answer and no voice mail available. Called pt's daughter,Patricia Stevenson, and reviewed instructions with her. She verbalized understanding and says she will be bringing her mother to surgery in am. She is aware that only one person may be in surgical waiting area while patient is in surgery.

## 2021-09-15 ENCOUNTER — Encounter (HOSPITAL_COMMUNITY): Payer: Self-pay

## 2021-09-15 ENCOUNTER — Inpatient Hospital Stay (HOSPITAL_COMMUNITY)
Admission: RE | Disposition: A | Discharge status: Home Health | Payer: Self-pay | Source: Home / Self Care | Attending: Neurosurgery

## 2021-09-15 ENCOUNTER — Inpatient Hospital Stay (HOSPITAL_COMMUNITY): Payer: Self-pay

## 2021-09-15 ENCOUNTER — Inpatient Hospital Stay (HOSPITAL_COMMUNITY)
Admission: RE | Admit: 2021-09-15 | Discharge: 2021-09-19 | DRG: 025 | Disposition: A | Discharge status: Home Health | Payer: Self-pay | Attending: Internal Medicine | Admitting: Internal Medicine

## 2021-09-15 ENCOUNTER — Other Ambulatory Visit: Payer: Self-pay

## 2021-09-15 DIAGNOSIS — Z79899 Other long term (current) drug therapy: Secondary | ICD-10-CM

## 2021-09-15 DIAGNOSIS — G936 Cerebral edema: Secondary | ICD-10-CM | POA: Diagnosis present

## 2021-09-15 DIAGNOSIS — I4891 Unspecified atrial fibrillation: Secondary | ICD-10-CM | POA: Diagnosis present

## 2021-09-15 DIAGNOSIS — E785 Hyperlipidemia, unspecified: Secondary | ICD-10-CM | POA: Diagnosis present

## 2021-09-15 DIAGNOSIS — Z20822 Contact with and (suspected) exposure to covid-19: Secondary | ICD-10-CM | POA: Diagnosis present

## 2021-09-15 DIAGNOSIS — C712 Malignant neoplasm of temporal lobe: Principal | ICD-10-CM | POA: Diagnosis present

## 2021-09-15 DIAGNOSIS — G9389 Other specified disorders of brain: Secondary | ICD-10-CM

## 2021-09-15 DIAGNOSIS — G9340 Encephalopathy, unspecified: Secondary | ICD-10-CM | POA: Diagnosis not present

## 2021-09-15 DIAGNOSIS — E871 Hypo-osmolality and hyponatremia: Secondary | ICD-10-CM | POA: Diagnosis present

## 2021-09-15 DIAGNOSIS — D72828 Other elevated white blood cell count: Secondary | ICD-10-CM | POA: Diagnosis not present

## 2021-09-15 DIAGNOSIS — C719 Malignant neoplasm of brain, unspecified: Secondary | ICD-10-CM | POA: Diagnosis present

## 2021-09-15 DIAGNOSIS — T380X5A Adverse effect of glucocorticoids and synthetic analogues, initial encounter: Secondary | ICD-10-CM | POA: Diagnosis not present

## 2021-09-15 DIAGNOSIS — I10 Essential (primary) hypertension: Secondary | ICD-10-CM | POA: Diagnosis present

## 2021-09-15 DIAGNOSIS — E559 Vitamin D deficiency, unspecified: Secondary | ICD-10-CM | POA: Diagnosis present

## 2021-09-15 DIAGNOSIS — R339 Retention of urine, unspecified: Secondary | ICD-10-CM | POA: Diagnosis present

## 2021-09-15 HISTORY — DX: Gastro-esophageal reflux disease without esophagitis: K21.9

## 2021-09-15 HISTORY — DX: Hyperlipidemia, unspecified: E78.5

## 2021-09-15 HISTORY — DX: Vitamin D deficiency, unspecified: E55.9

## 2021-09-15 HISTORY — PX: CRANIOTOMY: SHX93

## 2021-09-15 HISTORY — PX: APPLICATION OF CRANIAL NAVIGATION: SHX6578

## 2021-09-15 LAB — POCT I-STAT 7, (LYTES, BLD GAS, ICA,H+H)
Acid-base deficit: 1 mmol/L (ref 0.0–2.0)
Acid-base deficit: 2 mmol/L (ref 0.0–2.0)
Acid-base deficit: 3 mmol/L — ABNORMAL HIGH (ref 0.0–2.0)
Bicarbonate: 23.6 mmol/L (ref 20.0–28.0)
Bicarbonate: 22.1 mmol/L (ref 20.0–28.0)
Bicarbonate: 20.6 mmol/L (ref 20.0–28.0)
Calcium, Ion: 1.15 mmol/L (ref 1.15–1.40)
Calcium, Ion: 1.18 mmol/L (ref 1.15–1.40)
Calcium, Ion: 1.16 mmol/L (ref 1.15–1.40)
HCT: 36 % (ref 36.0–46.0)
HCT: 40 % (ref 36.0–46.0)
HCT: 38 % (ref 36.0–46.0)
Hemoglobin: 12.2 g/dL (ref 12.0–15.0)
Hemoglobin: 13.6 g/dL (ref 12.0–15.0)
Hemoglobin: 12.9 g/dL (ref 12.0–15.0)
O2 Saturation: 100 %
O2 Saturation: 100 %
O2 Saturation: 98 %
Patient temperature: 96.6
Potassium: 3.7 mmol/L (ref 3.5–5.1)
Potassium: 4.2 mmol/L (ref 3.5–5.1)
Potassium: 3.7 mmol/L (ref 3.5–5.1)
Sodium: 136 mmol/L (ref 135–145)
Sodium: 138 mmol/L (ref 135–145)
Sodium: 138 mmol/L (ref 135–145)
TCO2: 25 mmol/L (ref 22–32)
TCO2: 23 mmol/L (ref 22–32)
TCO2: 22 mmol/L (ref 22–32)
pCO2 arterial: 38.3 mmHg (ref 32.0–48.0)
pCO2 arterial: 33.5 mmHg (ref 32.0–48.0)
pCO2 arterial: 31.6 mmHg — ABNORMAL LOW (ref 32.0–48.0)
pH, Arterial: 7.399 (ref 7.350–7.450)
pH, Arterial: 7.426 (ref 7.350–7.450)
pH, Arterial: 7.418 (ref 7.350–7.450)
pO2, Arterial: 337 mmHg — ABNORMAL HIGH (ref 83.0–108.0)
pO2, Arterial: 277 mmHg — ABNORMAL HIGH (ref 83.0–108.0)
pO2, Arterial: 89 mmHg (ref 83.0–108.0)

## 2021-09-15 LAB — MRSA NEXT GEN BY PCR, NASAL: MRSA by PCR Next Gen: NOT DETECTED

## 2021-09-15 LAB — SARS CORONAVIRUS 2 BY RT PCR (HOSPITAL ORDER, PERFORMED IN ~~LOC~~ HOSPITAL LAB): SARS Coronavirus 2: NEGATIVE

## 2021-09-15 LAB — PREPARE RBC (CROSSMATCH)

## 2021-09-15 LAB — ABO/RH: ABO/RH(D): B POS

## 2021-09-15 IMAGING — CT CT HEAD W/O CM
3 of 4 series · 15 of 47 positions shown, 18 images · non-contrast
Comparison: CT head [DATE].  MRI head [DATE].

CLINICAL DATA: Postop brain biopsy today. Mental status change.
Brain mass.



[Series 3: head 5.0 h30s · axial · 0.46mm/px · z∈[-129,+11]mm · 9 of 34 slices shown, 12 images]
[im 3/34  brain]
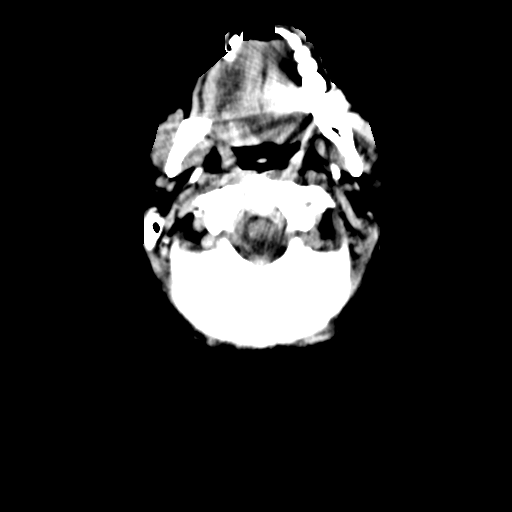
[im 3/34  bone]
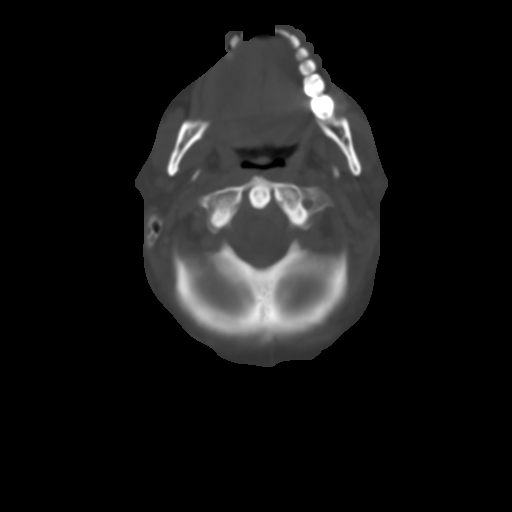
[im 8/34  brain]
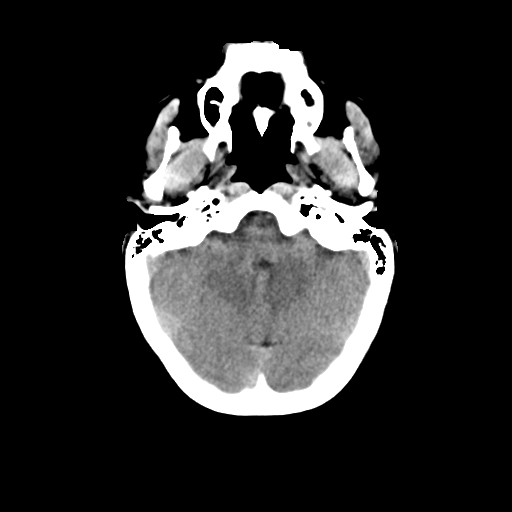
[im 10/34  brain]
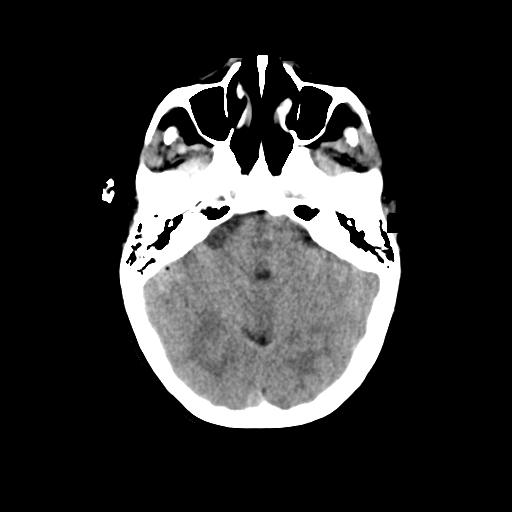
[im 15/34  brain]
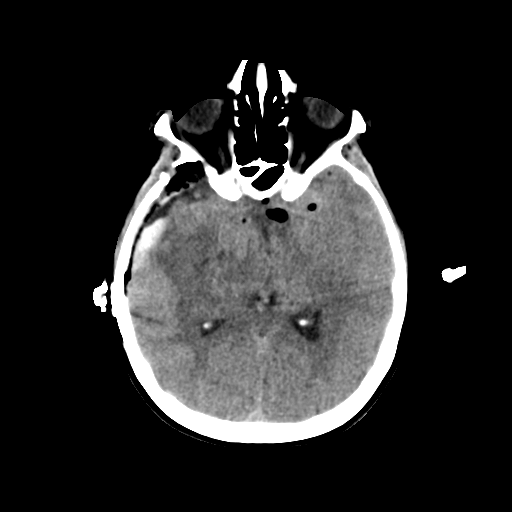
[im 17/34  brain]
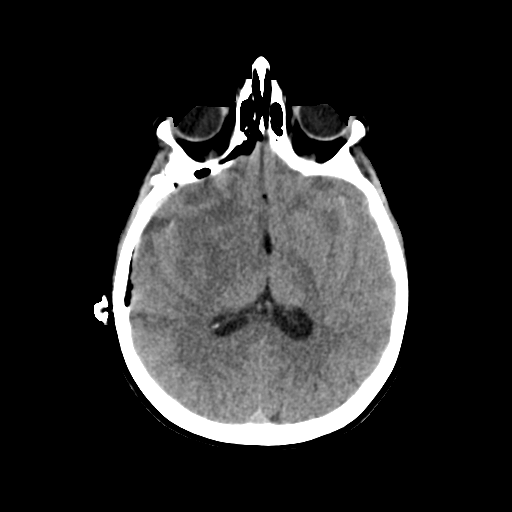
[im 17/34  bone]
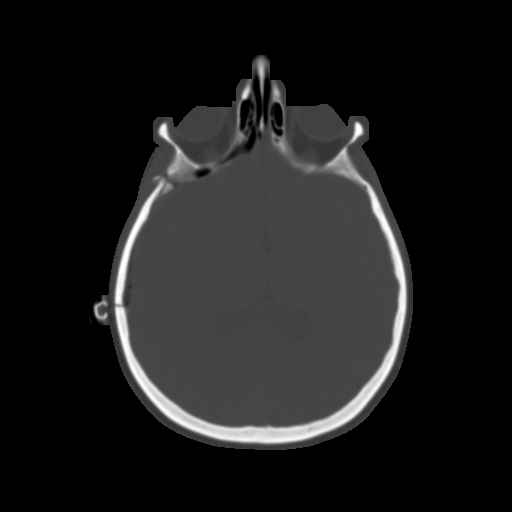
[im 19/34  brain]
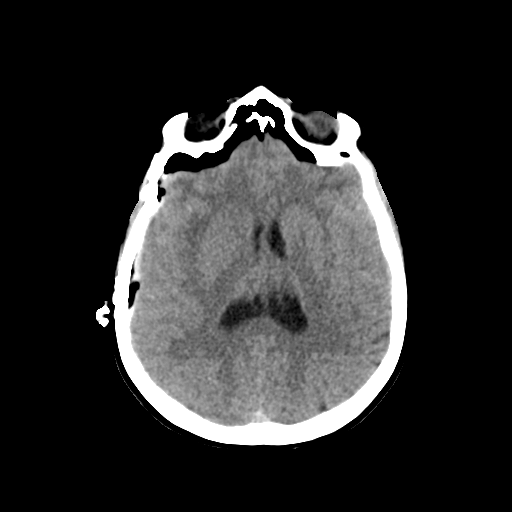
[im 24/34  brain]
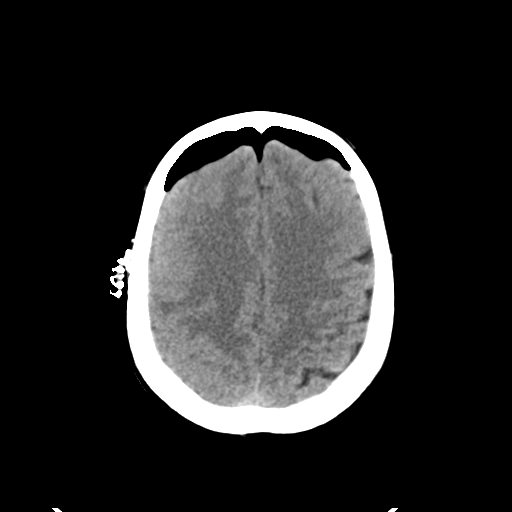
[im 26/34  brain]
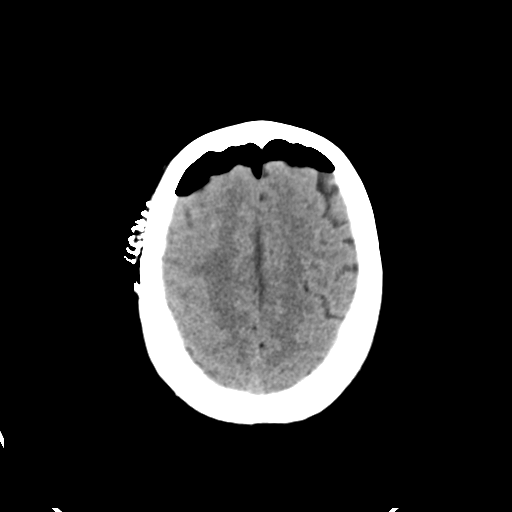
[im 31/34  brain]
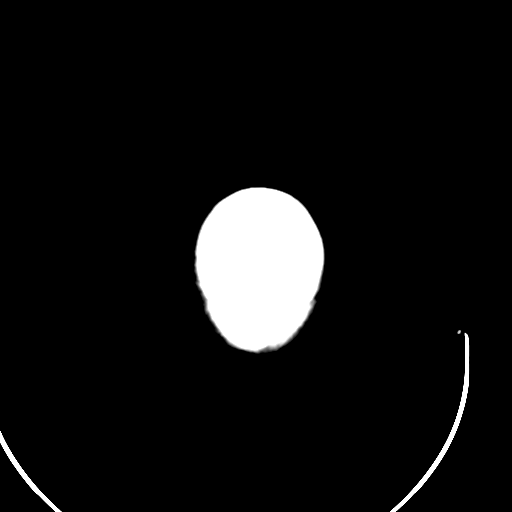
[im 31/34  bone]
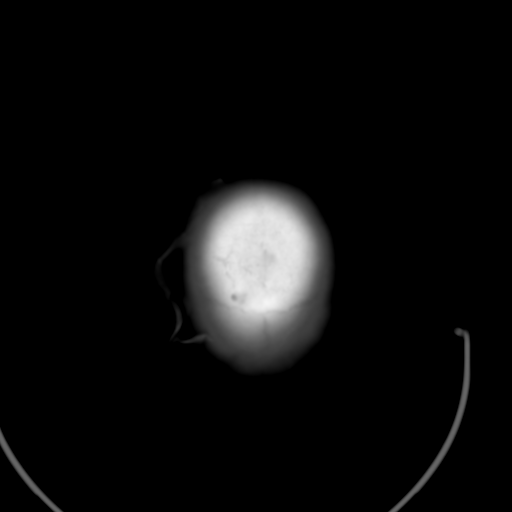

[Series 5: head 3.0 mpr cor · coronal · 0.31mm/px · 3 of 69 slices shown]
[im 23/69  brain]
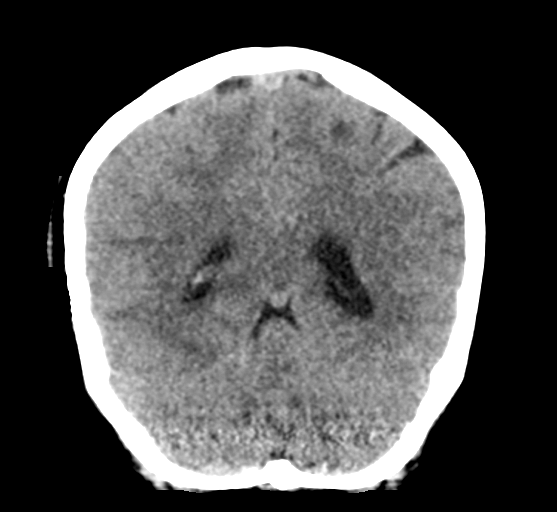
[im 31/69  brain]
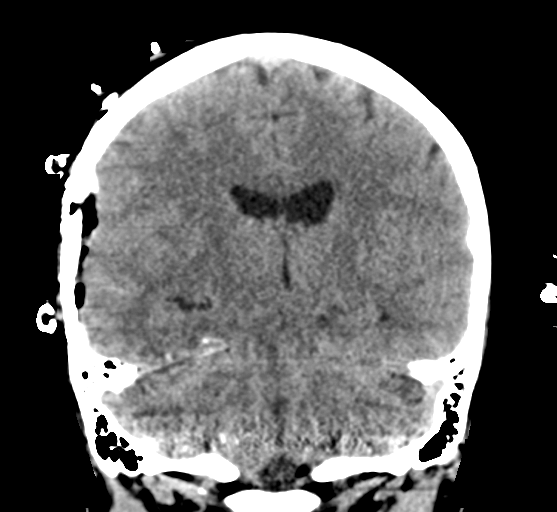
[im 38/69  brain]
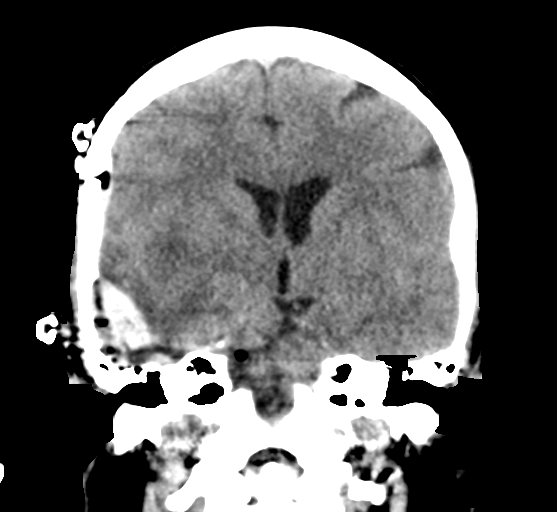

[Series 6: head 3.0 mpr sag · sagittal · 0.34mm/px · 3 of 57 slices shown]
[im 19/57  brain]
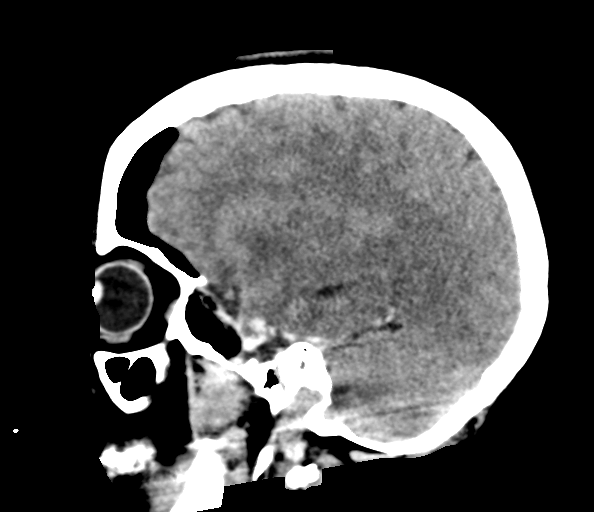
[im 29/57  brain]
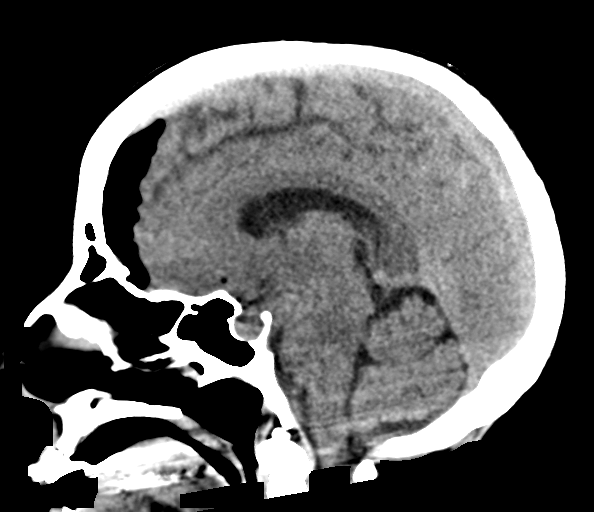
[im 38/57  brain]
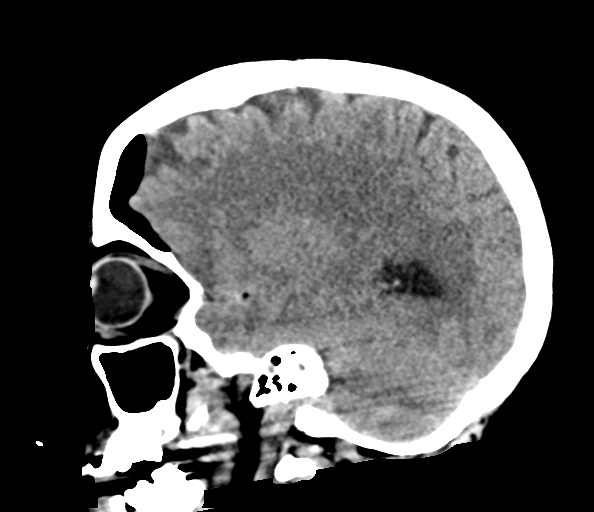

[15 of 47 positions shown; findings below may reference images not displayed]

FINDINGS: Brain: Postop right temporal craniotomy for tumor resection. There
appears to have been debulking of the tumor which is difficult to
see on unenhanced CT. Previously the tumor was hyperdense and
centered in the medial temporal lobe with surrounding edema. There
is improvement in mass-effect and midline shift which is now mild to
the left.

Interval development of subdural pneumocephalus anteriorly
bilaterally. Interval development of right temporal subdural
hematoma measuring approximately 11 mm in thickness. Small amount
of subarachnoid hemorrhage in the sylvian fissures. Small amount of
gas in the suprasellar cistern and sylvian fissures. Ventricle size
remains normal. No acute infarct.

Vascular: Negative for hyperdense vessel

Skull: Right temporal craniotomy. Craniotomy flap is well position.
No subcutaneous fluid collection.

Sinuses/Orbits: Mild mucosal edema left maxillary sinus. Remaining
sinuses clear. Negative orbit bilaterally.

Other: None
IMPRESSION: Postop tumor resection in the right temporal lobe. Postop
pneumocephalus. Postop subdural hematoma right temporal lobe
measuring 11 mm in thickness. Mild amount of subarachnoid
hemorrhage.

Improved mass-effect and midline shift compared with the preop
study.

## 2021-09-15 SURGERY — CRANIOTOMY TUMOR EXCISION
Anesthesia: General | Laterality: Right

## 2021-09-15 MED ORDER — THROMBIN 5000 UNITS EX SOLR
OROMUCOSAL | Status: DC | PRN
  Administered 2021-09-15 (×2): 5 mL via TOPICAL

## 2021-09-15 MED ORDER — ONDANSETRON HCL 4 MG/2ML IJ SOLN
INTRAMUSCULAR | Status: AC
  Filled 2021-09-15: qty 2

## 2021-09-15 MED ORDER — THROMBIN 5000 UNITS EX SOLR
CUTANEOUS | Status: AC
  Filled 2021-09-15: qty 5000

## 2021-09-15 MED ORDER — 0.9 % SODIUM CHLORIDE (POUR BTL) OPTIME
TOPICAL | Status: DC | PRN
  Administered 2021-09-15: 12:00:00 1000 mL
  Administered 2021-09-15: 08:00:00 2000 mL

## 2021-09-15 MED ORDER — CEFAZOLIN SODIUM-DEXTROSE 2-4 GM/100ML-% IV SOLN
2.0000 g | INTRAVENOUS | Status: AC
  Administered 2021-09-15 (×2): 2 g via INTRAVENOUS

## 2021-09-15 MED ORDER — ACETAMINOPHEN 325 MG PO TABS
650.0000 mg | ORAL_TABLET | ORAL | Status: DC | PRN
  Administered 2021-09-19: 650 mg via ORAL
  Filled 2021-09-15 (×2): qty 2

## 2021-09-15 MED ORDER — ARTIFICIAL TEARS OPHTHALMIC OINT
TOPICAL_OINTMENT | OPHTHALMIC | Status: AC
  Filled 2021-09-15: qty 3.5

## 2021-09-15 MED ORDER — ROCURONIUM BROMIDE 10 MG/ML (PF) SYRINGE
PREFILLED_SYRINGE | INTRAVENOUS | Status: DC | PRN
  Administered 2021-09-15: 30 mg via INTRAVENOUS
  Administered 2021-09-15: 50 mg via INTRAVENOUS
  Administered 2021-09-15: 20 mg via INTRAVENOUS
  Administered 2021-09-15: 100 mg via INTRAVENOUS

## 2021-09-15 MED ORDER — SODIUM CHLORIDE 0.9 % IV SOLN
0.1500 ug/kg/min | INTRAVENOUS | Status: AC
  Administered 2021-09-15: 10:00:00 .1 ug/kg/min via INTRAVENOUS
  Filled 2021-09-15: qty 2000

## 2021-09-15 MED ORDER — CHLORHEXIDINE GLUCONATE 0.12 % MT SOLN
15.0000 mL | Freq: Two times a day (BID) | OROMUCOSAL | Status: DC
  Administered 2021-09-15 – 2021-09-19 (×8): 15 mL via OROMUCOSAL
  Filled 2021-09-15 (×3): qty 15

## 2021-09-15 MED ORDER — ARTIFICIAL TEARS OPHTHALMIC OINT
TOPICAL_OINTMENT | OPHTHALMIC | Status: DC | PRN
  Administered 2021-09-15: 1 via OPHTHALMIC

## 2021-09-15 MED ORDER — LIDOCAINE 2% (20 MG/ML) 5 ML SYRINGE
INTRAMUSCULAR | Status: DC | PRN
  Administered 2021-09-15: 100 mg via INTRAVENOUS

## 2021-09-15 MED ORDER — CALCIUM CARBONATE 1250 (500 CA) MG PO TABS
1250.0000 mg | ORAL_TABLET | Freq: Two times a day (BID) | ORAL | Status: DC
  Administered 2021-09-16 – 2021-09-19 (×6): 1250 mg via ORAL
  Filled 2021-09-15 (×9): qty 1

## 2021-09-15 MED ORDER — CEFAZOLIN SODIUM-DEXTROSE 1-4 GM/50ML-% IV SOLN
1.0000 g | Freq: Three times a day (TID) | INTRAVENOUS | Status: AC
  Administered 2021-09-15 – 2021-09-17 (×6): 1 g via INTRAVENOUS
  Filled 2021-09-15 (×6): qty 50

## 2021-09-15 MED ORDER — NALOXONE HCL 0.4 MG/ML IJ SOLN: 0.4000 mg | Freq: Once | INTRAMUSCULAR | Status: DC | PRN

## 2021-09-15 MED ORDER — MORPHINE SULFATE (PF) 2 MG/ML IV SOLN: 1.0000 mg | INTRAVENOUS | Status: DC | PRN

## 2021-09-15 MED ORDER — EPHEDRINE 5 MG/ML INJ
INTRAVENOUS | Status: AC
  Filled 2021-09-15: qty 5

## 2021-09-15 MED ORDER — ACETAMINOPHEN 10 MG/ML IV SOLN: 1000.0000 mg | Freq: Once | INTRAVENOUS | Status: DC | PRN

## 2021-09-15 MED ORDER — LEVETIRACETAM 500 MG PO TABS: 500.0000 mg | ORAL_TABLET | Freq: Two times a day (BID) | ORAL | Status: DC

## 2021-09-15 MED ORDER — MIDAZOLAM HCL 2 MG/2ML IJ SOLN
INTRAMUSCULAR | Status: AC
  Filled 2021-09-15: qty 2

## 2021-09-15 MED ORDER — HYDROCODONE-ACETAMINOPHEN 5-325 MG PO TABS
1.0000 | ORAL_TABLET | ORAL | Status: DC | PRN
  Administered 2021-09-16 – 2021-09-18 (×6): 1 via ORAL
  Filled 2021-09-15 (×6): qty 1

## 2021-09-15 MED ORDER — SODIUM CHLORIDE 0.9 % IV SOLN
INTRAVENOUS | Status: DC | PRN
  Administered 2021-09-15: 08:00:00 500 mg via INTRAVENOUS

## 2021-09-15 MED ORDER — ONDANSETRON HCL 4 MG/2ML IJ SOLN
4.0000 mg | Freq: Once | INTRAMUSCULAR | Status: AC | PRN
  Administered 2021-09-15: 15:00:00 4 mg via INTRAVENOUS

## 2021-09-15 MED ORDER — PROPOFOL 10 MG/ML IV BOLUS
INTRAVENOUS | Status: DC | PRN
Start: 2021-09-15 — End: 2021-09-15
  Administered 2021-09-15: 150 mg via INTRAVENOUS
  Administered 2021-09-15: 50 mg via INTRAVENOUS

## 2021-09-15 MED ORDER — HYDROMORPHONE HCL 1 MG/ML IJ SOLN
0.2500 mg | INTRAMUSCULAR | Status: DC | PRN
  Administered 2021-09-15: 15:00:00 0.5 mg via INTRAVENOUS

## 2021-09-15 MED ORDER — DEXAMETHASONE SODIUM PHOSPHATE 10 MG/ML IJ SOLN
INTRAMUSCULAR | Status: AC
  Filled 2021-09-15: qty 1

## 2021-09-15 MED ORDER — ONDANSETRON HCL 4 MG PO TABS: 4.0000 mg | ORAL_TABLET | ORAL | Status: DC | PRN

## 2021-09-15 MED ORDER — FLEET ENEMA 7-19 GM/118ML RE ENEM: 1.0000 | ENEMA | Freq: Once | RECTAL | Status: DC | PRN

## 2021-09-15 MED ORDER — LABETALOL HCL 5 MG/ML IV SOLN
10.0000 mg | INTRAVENOUS | Status: DC | PRN
  Administered 2021-09-15 – 2021-09-16 (×3): 10 mg via INTRAVENOUS
  Filled 2021-09-15 (×3): qty 4

## 2021-09-15 MED ORDER — LIDOCAINE 2% (20 MG/ML) 5 ML SYRINGE
INTRAMUSCULAR | Status: AC
  Filled 2021-09-15: qty 5

## 2021-09-15 MED ORDER — HYDRALAZINE HCL 20 MG/ML IJ SOLN
INTRAMUSCULAR | Status: DC | PRN
  Administered 2021-09-15 (×2): 5 mg via INTRAVENOUS

## 2021-09-15 MED ORDER — PROPOFOL 10 MG/ML IV BOLUS
INTRAVENOUS | Status: AC
  Filled 2021-09-15: qty 20

## 2021-09-15 MED ORDER — PROPOFOL 1000 MG/100ML IV EMUL
INTRAVENOUS | Status: AC
  Filled 2021-09-15: qty 100

## 2021-09-15 MED ORDER — SODIUM CHLORIDE 0.9 % IR SOLN
Status: DC | PRN
  Administered 2021-09-15: 2000 mL

## 2021-09-15 MED ORDER — MAGNESIUM SULFATE 2 GM/50ML IV SOLN
2.0000 g | Freq: Once | INTRAVENOUS | Status: AC
  Administered 2021-09-15: 17:00:00 2 g via INTRAVENOUS
  Filled 2021-09-15: qty 50

## 2021-09-15 MED ORDER — POLYETHYLENE GLYCOL 3350 17 G PO PACK: 17.0000 g | PACK | Freq: Every day | ORAL | Status: DC | PRN

## 2021-09-15 MED ORDER — LEVETIRACETAM IN NACL 500 MG/100ML IV SOLN
500.0000 mg | Freq: Two times a day (BID) | INTRAVENOUS | Status: DC
  Administered 2021-09-15 – 2021-09-17 (×4): 500 mg via INTRAVENOUS
  Filled 2021-09-15 (×4): qty 100

## 2021-09-15 MED ORDER — PANTOPRAZOLE SODIUM 40 MG PO TBEC: 40.0000 mg | DELAYED_RELEASE_TABLET | Freq: Every day | ORAL | Status: DC

## 2021-09-15 MED ORDER — ROCURONIUM BROMIDE 10 MG/ML (PF) SYRINGE
PREFILLED_SYRINGE | INTRAVENOUS | Status: AC
  Filled 2021-09-15: qty 10

## 2021-09-15 MED ORDER — HEMOSTATIC AGENTS (NO CHARGE) OPTIME
TOPICAL | Status: DC | PRN
  Administered 2021-09-15 (×2): 1 via TOPICAL

## 2021-09-15 MED ORDER — VITAMIN D 25 MCG (1000 UNIT) PO TABS
1000.0000 [IU] | ORAL_TABLET | Freq: Every day | ORAL | Status: DC
  Administered 2021-09-16 – 2021-09-19 (×4): 1000 [IU] via ORAL
  Filled 2021-09-15 (×4): qty 1

## 2021-09-15 MED ORDER — MIDAZOLAM HCL 5 MG/5ML IJ SOLN
INTRAMUSCULAR | Status: DC | PRN
  Administered 2021-09-15: 2 mg via INTRAVENOUS

## 2021-09-15 MED ORDER — HYDROMORPHONE HCL 1 MG/ML IJ SOLN
INTRAMUSCULAR | Status: AC
  Filled 2021-09-15: qty 1

## 2021-09-15 MED ORDER — CHLORHEXIDINE GLUCONATE CLOTH 2 % EX PADS
6.0000 | MEDICATED_PAD | Freq: Every day | CUTANEOUS | Status: DC
  Administered 2021-09-15 – 2021-09-18 (×4): 6 via TOPICAL

## 2021-09-15 MED ORDER — THROMBIN 20000 UNITS EX SOLR
CUTANEOUS | Status: AC
  Filled 2021-09-15: qty 20000

## 2021-09-15 MED ORDER — PROPOFOL 500 MG/50ML IV EMUL
INTRAVENOUS | Status: DC | PRN
  Administered 2021-09-15: 20 ug/kg/min via INTRAVENOUS

## 2021-09-15 MED ORDER — DOCUSATE SODIUM 100 MG PO CAPS
100.0000 mg | ORAL_CAPSULE | Freq: Two times a day (BID) | ORAL | Status: DC
  Administered 2021-09-16 – 2021-09-19 (×7): 100 mg via ORAL
  Filled 2021-09-15 (×7): qty 1

## 2021-09-15 MED ORDER — SUGAMMADEX SODIUM 200 MG/2ML IV SOLN
INTRAVENOUS | Status: DC | PRN
  Administered 2021-09-15: 120 mg via INTRAVENOUS

## 2021-09-15 MED ORDER — CHLORHEXIDINE GLUCONATE CLOTH 2 % EX PADS
6.0000 | MEDICATED_PAD | Freq: Once | CUTANEOUS | Status: DC
  Administered 2021-09-15: 16:00:00 6 via TOPICAL

## 2021-09-15 MED ORDER — FENTANYL CITRATE (PF) 250 MCG/5ML IJ SOLN
INTRAMUSCULAR | Status: AC
  Filled 2021-09-15: qty 5

## 2021-09-15 MED ORDER — PANTOPRAZOLE SODIUM 40 MG IV SOLR
40.0000 mg | INTRAVENOUS | Status: DC
  Administered 2021-09-15: 23:00:00 40 mg via INTRAVENOUS
  Filled 2021-09-15: qty 40

## 2021-09-15 MED ORDER — PHENYLEPHRINE 40 MCG/ML (10ML) SYRINGE FOR IV PUSH (FOR BLOOD PRESSURE SUPPORT)
PREFILLED_SYRINGE | INTRAVENOUS | Status: AC
  Filled 2021-09-15: qty 10

## 2021-09-15 MED ORDER — ESMOLOL HCL 100 MG/10ML IV SOLN
INTRAVENOUS | Status: AC
  Filled 2021-09-15: qty 20

## 2021-09-15 MED ORDER — FUROSEMIDE 10 MG/ML IJ SOLN
INTRAMUSCULAR | Status: AC
  Filled 2021-09-15: qty 4

## 2021-09-15 MED ORDER — DEXAMETHASONE SODIUM PHOSPHATE 10 MG/ML IJ SOLN
6.0000 mg | Freq: Once | INTRAMUSCULAR | Status: AC
  Administered 2021-09-15: 18:00:00 6 mg via INTRAVENOUS
  Filled 2021-09-15: qty 1

## 2021-09-15 MED ORDER — NALOXONE HCL 0.4 MG/ML IJ SOLN
INTRAMUSCULAR | Status: AC
  Administered 2021-09-15: 17:00:00 0.4 mg via INTRAVENOUS
  Filled 2021-09-15: qty 1

## 2021-09-15 MED ORDER — ACETAMINOPHEN 650 MG RE SUPP: 650.0000 mg | RECTAL | Status: DC | PRN

## 2021-09-15 MED ORDER — PHENYLEPHRINE 40 MCG/ML (10ML) SYRINGE FOR IV PUSH (FOR BLOOD PRESSURE SUPPORT)
PREFILLED_SYRINGE | INTRAVENOUS | Status: DC | PRN
  Administered 2021-09-15: 80 ug via INTRAVENOUS

## 2021-09-15 MED ORDER — ONDANSETRON HCL 4 MG/2ML IJ SOLN
4.0000 mg | INTRAMUSCULAR | Status: DC | PRN
  Administered 2021-09-16 (×3): 4 mg via INTRAVENOUS
  Filled 2021-09-15 (×3): qty 2

## 2021-09-15 MED ORDER — ONDANSETRON HCL 4 MG/2ML IJ SOLN
INTRAMUSCULAR | Status: DC | PRN
  Administered 2021-09-15: 4 mg via INTRAVENOUS

## 2021-09-15 MED ORDER — SODIUM CHLORIDE 0.9 % IV SOLN: 10.0000 mL/h | Freq: Once | INTRAVENOUS | Status: AC

## 2021-09-15 MED ORDER — ORAL CARE MOUTH RINSE
15.0000 mL | Freq: Two times a day (BID) | OROMUCOSAL | Status: DC
  Administered 2021-09-15 – 2021-09-18 (×7): 15 mL via OROMUCOSAL

## 2021-09-15 MED ORDER — DILTIAZEM HCL-DEXTROSE 125-5 MG/125ML-% IV SOLN (PREMIX)
5.0000 mg/h | INTRAVENOUS | Status: AC
  Administered 2021-09-15: 19:00:00 5 mg/h via INTRAVENOUS
  Filled 2021-09-15 (×3): qty 125

## 2021-09-15 MED ORDER — ACETAMINOPHEN 10 MG/ML IV SOLN
INTRAVENOUS | Status: DC | PRN
  Administered 2021-09-15: 1000 mg via INTRAVENOUS

## 2021-09-15 MED ORDER — THROMBIN 20000 UNITS EX SOLR
CUTANEOUS | Status: DC | PRN
  Administered 2021-09-15: 08:00:00 20 mL via TOPICAL

## 2021-09-15 MED ORDER — PHENYLEPHRINE HCL-NACL 20-0.9 MG/250ML-% IV SOLN
INTRAVENOUS | Status: DC | PRN
  Administered 2021-09-15: 30 ug/min via INTRAVENOUS

## 2021-09-15 MED ORDER — DEXAMETHASONE SODIUM PHOSPHATE 10 MG/ML IJ SOLN
INTRAMUSCULAR | Status: DC | PRN
Start: 2021-09-15 — End: 2021-09-15
  Administered 2021-09-15: 10 mg via INTRAVENOUS

## 2021-09-15 MED ORDER — LIDOCAINE-EPINEPHRINE 1 %-1:100000 IJ SOLN
INTRAMUSCULAR | Status: AC
  Filled 2021-09-15: qty 1

## 2021-09-15 MED ORDER — NICARDIPINE HCL IN NACL 20-0.86 MG/200ML-% IV SOLN
3.0000 mg/h | INTRAVENOUS | Status: DC
  Filled 2021-09-15: qty 200

## 2021-09-15 MED ORDER — ESMOLOL HCL 100 MG/10ML IV SOLN
INTRAVENOUS | Status: DC | PRN
  Administered 2021-09-15 (×2): 20 mg via INTRAVENOUS
  Administered 2021-09-15: 40 mg via INTRAVENOUS
  Administered 2021-09-15: 20 mg via INTRAVENOUS
  Administered 2021-09-15 (×2): 40 mg via INTRAVENOUS
  Administered 2021-09-15: 20 mg via INTRAVENOUS

## 2021-09-15 MED ORDER — BACITRACIN ZINC 500 UNIT/GM EX OINT
TOPICAL_OINTMENT | CUTANEOUS | Status: DC | PRN
Start: 2021-09-15 — End: 2021-09-15
  Administered 2021-09-15: 1 via TOPICAL

## 2021-09-15 MED ORDER — MANNITOL 25 % IV SOLN
INTRAVENOUS | Status: DC | PRN
Start: 2021-09-15 — End: 2021-09-15
  Administered 2021-09-15: 29 g via INTRAVENOUS

## 2021-09-15 MED ORDER — CEFAZOLIN SODIUM 1 G IJ SOLR
INTRAMUSCULAR | Status: AC
  Filled 2021-09-15: qty 20

## 2021-09-15 MED ORDER — ORAL CARE MOUTH RINSE: 15.0000 mL | Freq: Once | OROMUCOSAL | Status: AC

## 2021-09-15 MED ORDER — PHENYLEPHRINE HCL-NACL 20-0.9 MG/250ML-% IV SOLN
INTRAVENOUS | Status: AC
  Filled 2021-09-15: qty 500

## 2021-09-15 MED ORDER — FENTANYL CITRATE (PF) 250 MCG/5ML IJ SOLN
INTRAMUSCULAR | Status: DC | PRN
  Administered 2021-09-15: 100 ug via INTRAVENOUS
  Administered 2021-09-15 (×3): 50 ug via INTRAVENOUS

## 2021-09-15 MED ORDER — SODIUM CHLORIDE 0.9 % IV SOLN: INTRAVENOUS | Status: DC

## 2021-09-15 MED ORDER — CEFAZOLIN SODIUM-DEXTROSE 2-4 GM/100ML-% IV SOLN
INTRAVENOUS | Status: AC
  Filled 2021-09-15: qty 100

## 2021-09-15 MED ORDER — LIDOCAINE-EPINEPHRINE 1 %-1:100000 IJ SOLN
INTRAMUSCULAR | Status: DC | PRN
  Administered 2021-09-15: 8 mL

## 2021-09-15 MED ORDER — ENALAPRILAT 1.25 MG/ML IV SOLN: 1.2500 mg | Freq: Four times a day (QID) | INTRAVENOUS | Status: DC | PRN

## 2021-09-15 MED ORDER — NALOXONE HCL 0.4 MG/ML IJ SOLN: 0.4000 mg | Freq: Once | INTRAMUSCULAR | Status: AC

## 2021-09-15 MED ORDER — BACITRACIN ZINC 500 UNIT/GM EX OINT
TOPICAL_OINTMENT | CUTANEOUS | Status: AC
  Filled 2021-09-15: qty 28.35

## 2021-09-15 MED ORDER — DEXAMETHASONE SODIUM PHOSPHATE 4 MG/ML IJ SOLN
4.0000 mg | Freq: Four times a day (QID) | INTRAMUSCULAR | Status: DC
  Administered 2021-09-15 – 2021-09-17 (×8): 4 mg via INTRAVENOUS
  Filled 2021-09-15 (×9): qty 1

## 2021-09-15 MED ORDER — ACETAMINOPHEN 10 MG/ML IV SOLN
INTRAVENOUS | Status: AC
  Filled 2021-09-15: qty 100

## 2021-09-15 MED ORDER — PROMETHAZINE HCL 25 MG PO TABS: 12.5000 mg | ORAL_TABLET | ORAL | Status: DC | PRN

## 2021-09-15 MED ORDER — CHLORHEXIDINE GLUCONATE 0.12 % MT SOLN
OROMUCOSAL | Status: AC
  Filled 2021-09-15: qty 15

## 2021-09-15 MED ORDER — CHLORHEXIDINE GLUCONATE 0.12 % MT SOLN
15.0000 mL | Freq: Once | OROMUCOSAL | Status: AC
  Administered 2021-09-15: 07:00:00 15 mL via OROMUCOSAL

## 2021-09-15 SURGICAL SUPPLY — 109 items
APL SKNCLS STERI-STRIP NONHPOA (GAUZE/BANDAGES/DRESSINGS)
BAG COUNTER SPONGE SURGICOUNT (BAG) ×4 IMPLANT
BAG SPNG CNTER NS LX DISP (BAG) ×2
BAND INSRT 18 STRL LF DISP RB (MISCELLANEOUS) ×2
BAND RUBBER #18 3X1/16 STRL (MISCELLANEOUS) ×2 IMPLANT
BENZOIN TINCTURE PRP APPL 2/3 (GAUZE/BANDAGES/DRESSINGS) IMPLANT
BLADE CLIPPER SURG (BLADE) ×2 IMPLANT
BLADE SAW GIGLI 16 STRL (MISCELLANEOUS) IMPLANT
BLADE SURG 11 STRL SS (BLADE) ×3 IMPLANT
BLADE SURG 15 STRL LF DISP TIS (BLADE) IMPLANT
BLADE SURG 15 STRL SS (BLADE)
BNDG CMPR 75X41 PLY HI ABS (GAUZE/BANDAGES/DRESSINGS)
BNDG GAUZE ELAST 4 BULKY (GAUZE/BANDAGES/DRESSINGS) IMPLANT
BNDG STRETCH 4X75 STRL LF (GAUZE/BANDAGES/DRESSINGS) IMPLANT
BUR ACORN 9.0 PRECISION (BURR) ×3 IMPLANT
BUR ROUND FLUTED 4 SOFT TCH (BURR) IMPLANT
BUR SPIRAL ROUTER 2.3 (BUR) ×3 IMPLANT
CANISTER SUCT 3000ML PPV (MISCELLANEOUS) ×7 IMPLANT
CATH VENTRIC 35X38 W/TROCAR LG (CATHETERS) IMPLANT
CLIP VESOCCLUDE MED 6/CT (CLIP) IMPLANT
CNTNR URN SCR LID CUP LEK RST (MISCELLANEOUS) ×2 IMPLANT
CONT SPEC 4OZ STRL OR WHT (MISCELLANEOUS) ×4
COVER BURR HOLE 7 (Orthopedic Implant) ×3 IMPLANT
COVER MAYO STAND STRL (DRAPES) IMPLANT
DECANTER SPIKE VIAL GLASS SM (MISCELLANEOUS) ×3 IMPLANT
DRAIN SUBARACHNOID (WOUND CARE) IMPLANT
DRAPE 3/4 80X56 (DRAPES) ×3 IMPLANT
DRAPE HALF SHEET 40X57 (DRAPES) ×3 IMPLANT
DRAPE MICROSCOPE LEICA (MISCELLANEOUS) ×1 IMPLANT
DRAPE NEUROLOGICAL W/INCISE (DRAPES) ×3 IMPLANT
DRAPE STERI IOBAN 125X83 (DRAPES) IMPLANT
DRAPE SURG 17X23 STRL (DRAPES) IMPLANT
DRAPE WARM FLUID 44X44 (DRAPES) ×3 IMPLANT
DRSG ADAPTIC 3X8 NADH LF (GAUZE/BANDAGES/DRESSINGS) IMPLANT
DRSG AQUACEL AG ADV 3.5X 6 (GAUZE/BANDAGES/DRESSINGS) ×2 IMPLANT
DRSG TELFA 3X8 NADH (GAUZE/BANDAGES/DRESSINGS) IMPLANT
DURAPREP 6ML APPLICATOR 50/CS (WOUND CARE) ×3 IMPLANT
ELECT COATED BLADE 2.86 ST (ELECTRODE) ×3 IMPLANT
ELECT REM PT RETURN 9FT ADLT (ELECTROSURGICAL) ×2
ELECTRODE REM PT RTRN 9FT ADLT (ELECTROSURGICAL) ×2 IMPLANT
EVACUATOR 1/8 PVC DRAIN (DRAIN) ×1 IMPLANT
EVACUATOR SILICONE 100CC (DRAIN) IMPLANT
FORCEPS BIPO MALIS IRRIG 9X1.5 (NEUROSURGERY SUPPLIES) ×3 IMPLANT
FORCEPS BIPOLAR SPETZLER 8 1.0 (NEUROSURGERY SUPPLIES) ×1 IMPLANT
GAUZE 4X4 16PLY ~~LOC~~+RFID DBL (SPONGE) ×2 IMPLANT
GAUZE SPONGE 4X4 12PLY STRL (GAUZE/BANDAGES/DRESSINGS) ×1 IMPLANT
GAUZE XEROFORM 1X8 LF (GAUZE/BANDAGES/DRESSINGS) IMPLANT
GLOVE EXAM NITRILE LRG STRL (GLOVE) IMPLANT
GLOVE EXAM NITRILE XL STR (GLOVE) IMPLANT
GLOVE SURG ENC MOIS LTX SZ7.5 (GLOVE) ×2 IMPLANT
GLOVE SURG LTX SZ7.5 (GLOVE) IMPLANT
GLOVE SURG POLYISO LF SZ6.5 (GLOVE) ×3 IMPLANT
GLOVE SURG POLYISO LF SZ7.5 (GLOVE) ×2 IMPLANT
GLOVE SURG POLYISO LF SZ8 (GLOVE) ×2 IMPLANT
GLOVE SURG UNDER POLY LF SZ7.5 (GLOVE) ×3 IMPLANT
GOWN STRL REUS W/ TWL LRG LVL3 (GOWN DISPOSABLE) ×4 IMPLANT
GOWN STRL REUS W/ TWL XL LVL3 (GOWN DISPOSABLE) IMPLANT
GOWN STRL REUS W/TWL 2XL LVL3 (GOWN DISPOSABLE) IMPLANT
GOWN STRL REUS W/TWL LRG LVL3 (GOWN DISPOSABLE) ×6
GOWN STRL REUS W/TWL XL LVL3 (GOWN DISPOSABLE) ×6
GRAFT DURAGEN MATRIX 3WX3L (Graft) ×2 IMPLANT
GRAFT DURAGEN MATRIX 3X3 SNGL (Graft) IMPLANT
HEMOSTAT POWDER KIT SURGIFOAM (HEMOSTASIS) ×4 IMPLANT
HEMOSTAT SURGICEL 2X14 (HEMOSTASIS) ×3 IMPLANT
HEMOSTAT SURGICEL 2X4 FIBR (HEMOSTASIS) ×1 IMPLANT
HOOK DURA 1/2IN (MISCELLANEOUS) ×3 IMPLANT
HOOK RETRACTION 12 ELAST STAY (MISCELLANEOUS) ×6 IMPLANT
IV NS 1000ML (IV SOLUTION) ×4
IV NS 1000ML BAXH (IV SOLUTION) ×2 IMPLANT
KIT BASIN OR (CUSTOM PROCEDURE TRAY) ×3 IMPLANT
KIT DRAIN CSF ACCUDRAIN (MISCELLANEOUS) IMPLANT
KIT TURNOVER KIT B (KITS) ×3 IMPLANT
MARKER SPHERE PSV REFLC 13MM (MARKER) ×6 IMPLANT
NDL SPNL 18GX3.5 QUINCKE PK (NEEDLE) IMPLANT
NEEDLE HYPO 22GX1.5 SAFETY (NEEDLE) ×3 IMPLANT
NEEDLE SPNL 18GX3.5 QUINCKE PK (NEEDLE) IMPLANT
NS IRRIG 1000ML POUR BTL (IV SOLUTION) ×9 IMPLANT
PACK CRANIOTOMY CUSTOM (CUSTOM PROCEDURE TRAY) ×3 IMPLANT
PAD DRESSING TELFA 3X8 NADH (GAUZE/BANDAGES/DRESSINGS) IMPLANT
PATTIES SURGICAL .25X.25 (GAUZE/BANDAGES/DRESSINGS) IMPLANT
PATTIES SURGICAL .5 X.5 (GAUZE/BANDAGES/DRESSINGS) ×1 IMPLANT
PATTIES SURGICAL .5 X3 (DISPOSABLE) IMPLANT
PATTIES SURGICAL 1/4 X 3 (GAUZE/BANDAGES/DRESSINGS) ×1 IMPLANT
PATTIES SURGICAL 1X1 (DISPOSABLE) IMPLANT
PENCIL SMOKE EVACUATOR (MISCELLANEOUS) ×1 IMPLANT
PIN MAYFIELD SKULL DISP (PIN) ×3 IMPLANT
PLATE BONE 12 2H TARGET XL (Plate) ×1 IMPLANT
SCREW SELF DRILL HT 1.5/4MM (Screw) ×14 IMPLANT
SEALANT ADHERUS EXTEND TIP (MISCELLANEOUS) ×1 IMPLANT
SET CARTRIDGE AND TUBING (SET/KITS/TRAYS/PACK) ×1 IMPLANT
SET TUBING IRRIGATION DISP (TUBING) ×3 IMPLANT
SPONGE NEURO XRAY DETECT 1X3 (DISPOSABLE) IMPLANT
SPONGE SURGIFOAM ABS GEL 100 (HEMOSTASIS) ×3 IMPLANT
STAPLER VISISTAT 35W (STAPLE) ×5 IMPLANT
STOCKINETTE STERILE 6X72 (MISCELLANEOUS) ×1 IMPLANT
SUT ETHILON 3 0 FSL (SUTURE) IMPLANT
SUT ETHILON 3 0 PS 1 (SUTURE) IMPLANT
SUT MNCRL AB 3-0 PS2 18 (SUTURE) IMPLANT
SUT NURALON 4 0 TR CR/8 (SUTURE) ×9 IMPLANT
SUT SILK 0 TIES 10X30 (SUTURE) IMPLANT
SUT VIC AB 2-0 CP2 18 (SUTURE) ×5 IMPLANT
TIP ASPIRATION CUSA EXT ST 2.0 (MISCELLANEOUS) ×1 IMPLANT
TOWEL GREEN STERILE (TOWEL DISPOSABLE) ×3 IMPLANT
TOWEL GREEN STERILE FF (TOWEL DISPOSABLE) ×3 IMPLANT
TRAY FOLEY MTR SLVR 16FR STAT (SET/KITS/TRAYS/PACK) ×3 IMPLANT
TUBE CONNECTING 12X1/4 (SUCTIONS) ×3 IMPLANT
UNDERPAD 30X36 HEAVY ABSORB (UNDERPADS AND DIAPERS) ×2 IMPLANT
WATER STERILE IRR 1000ML POUR (IV SOLUTION) ×3 IMPLANT
WRENCH TORQUE 36KHZ (INSTRUMENTS) ×1 IMPLANT

## 2021-09-15 NOTE — Anesthesia Postprocedure Evaluation (Signed)
Anesthesia Post Note  Patient: Patricia Stevenson  Procedure(s) Performed: Right Craniotomy Temporal Lobectomy with Brain Lab (Right) APPLICATION OF CRANIAL NAVIGATION (Right)     Patient location during evaluation: PACU Anesthesia Type: General Level of consciousness: awake and alert Pain management: pain level controlled Vital Signs Assessment: post-procedure vital signs reviewed and stable Respiratory status: spontaneous breathing, nonlabored ventilation, respiratory function stable and patient connected to nasal cannula oxygen Cardiovascular status: blood pressure returned to baseline and stable Postop Assessment: no apparent nausea or vomiting Anesthetic complications: no   No notable events documented.  Last Vitals:  Vitals:   09/15/21 1505 09/15/21 1520  BP: (!) 126/57 125/62  Pulse: 63 66  Resp: 16 17  Temp:  36.5 C  SpO2: 98% 97%    Last Pain:  Vitals:   09/15/21 1520  TempSrc:   PainSc: Asleep                 Irvin Bastin S

## 2021-09-15 NOTE — Anesthesia Procedure Notes (Signed)
Procedure Name: Intubation Date/Time: 09/15/2021 7:54 AM Performed by: Renato Shin, CRNA Pre-anesthesia Checklist: Patient identified, Emergency Drugs available, Suction available and Patient being monitored Patient Re-evaluated:Patient Re-evaluated prior to induction Oxygen Delivery Method: Circle system utilized Preoxygenation: Pre-oxygenation with 100% oxygen Induction Type: IV induction Ventilation: Mask ventilation without difficulty Laryngoscope Size: Mac and 3 Grade View: Grade I Tube type: Oral Tube size: 7.0 mm Number of attempts: 1 Airway Equipment and Method: Stylet Placement Confirmation: ETT inserted through vocal cords under direct vision, positive ETCO2 and breath sounds checked- equal and bilateral Secured at: 20 cm Tube secured with: Tape Dental Injury: Teeth and Oropharynx as per pre-operative assessment  Comments: Placed by Lucita Ferrara, SRNA under supervision of MDA and CRNA

## 2021-09-15 NOTE — Anesthesia Preprocedure Evaluation (Signed)
Anesthesia Evaluation  Patient identified by MRN, date of birth, ID band Patient awake    Reviewed: Allergy & Precautions, NPO status , Patient's Chart, lab work & pertinent test results  Airway Mallampati: II  TM Distance: >3 FB Neck ROM: Full    Dental no notable dental hx.    Pulmonary neg pulmonary ROS,    Pulmonary exam normal breath sounds clear to auscultation       Cardiovascular hypertension, Pt. on medications Normal cardiovascular exam Rhythm:Regular Rate:Normal     Neuro/Psych negative neurological ROS  negative psych ROS   GI/Hepatic negative GI ROS, Neg liver ROS,   Endo/Other  negative endocrine ROS  Renal/GU negative Renal ROS  negative genitourinary   Musculoskeletal negative musculoskeletal ROS (+)   Abdominal   Peds negative pediatric ROS (+)  Hematology negative hematology ROS (+)   Anesthesia Other Findings   Reproductive/Obstetrics negative OB ROS                             Anesthesia Physical Anesthesia Plan  ASA: 3  Anesthesia Plan: General   Post-op Pain Management: Dilaudid IV   Induction: Intravenous  PONV Risk Score and Plan: 3 and Ondansetron, Dexamethasone and Treatment may vary due to age or medical condition  Airway Management Planned: Oral ETT  Additional Equipment: Arterial line  Intra-op Plan:   Post-operative Plan: Possible Post-op intubation/ventilation  Informed Consent: I have reviewed the patients History and Physical, chart, labs and discussed the procedure including the risks, benefits and alternatives for the proposed anesthesia with the patient or authorized representative who has indicated his/her understanding and acceptance.     Dental advisory given  Plan Discussed with: CRNA and Surgeon  Anesthesia Plan Comments:         Anesthesia Quick Evaluation

## 2021-09-15 NOTE — H&P (Signed)
CC: brain mass  HPI:     Patient is a 61 y.o. female presents with left sided weakness and cognitive difficulty. She was found to have a large right temporal lobe mass with extensive infiltration.    Patient Active Problem List   Diagnosis Date Noted   Brain tumor ([place]) 09/08/2021   Hyperlipidemia 05/23/2020   Vitamin D deficiency 05/23/2020   AMENORRHEA 03/13/2009   Past Medical History:  Diagnosis Date   Hypertension     Past Surgical History:  Procedure Laterality Date   BREAST BIOPSY Left    20 years ago in [place]- benign- not sure if bx or cyst    Medications Prior to Admission  Medication Sig Dispense Refill Last Dose   Ascorbic Acid (VITA-C PO) Take 1 tablet by mouth daily.   09/14/2021   calcium carbonate (OSCAL) 1500 (600 Ca) MG TABS tablet Take by mouth 2 (two) times daily with a meal.   09/14/2021   cholecalciferol (VITAMIN D3) 25 MCG (1000 UNIT) tablet Take 1,000 Units by mouth daily.   09/14/2021   dexamethasone (DECADRON) 4 MG tablet Take 1 tablet (4 mg total) by mouth 3 (three) times daily. 120 tablet 0 09/15/2021 at 0415   dicyclomine (BENTYL) 10 MG capsule Take 1 capsule (10 mg total) by mouth in the morning and at bedtime. As needed 90 capsule 0 09/15/2021 at 0415   famotidine (PEPCID) 20 MG tablet Take 1 tablet (20 mg total) by mouth 2 (two) times daily. 60 tablet 3 09/15/2021 at 0415   levETIRAcetam (KEPPRA) 500 MG tablet Take 1 tablet (500 mg total) by mouth 2 (two) times daily. 60 tablet 2 09/15/2021 at 0415   acetaminophen (TYLENOL) 325 MG tablet Take 650 mg by mouth every 6 (six) hours as needed for moderate pain.   Unknown   Allergies  Allergen Reactions   Latex     Social History   Tobacco Use   Smoking status: Never   Smokeless tobacco: Never  Substance Use Topics   Alcohol use: No    Family History  Problem Relation Age of Onset   Breast cancer Neg Hx      Review of Systems Pertinent items are noted in HPI.  Objective:   Patient Vitals  for the past 8 hrs:  BP Temp Temp src Pulse Resp SpO2 Height Weight  09/15/21 0602 (!) 180/79 97.6 F (36.4 C) Oral (!) 59 18 97 % 5\' 2"  (1.575 m) 56.7 kg  09/15/21 0545 -- -- -- -- (P) 18 -- (P) 5\' 2"  (1.575 m) (P) 56.7 kg   No intake/output data recorded. No intake/output data recorded.      General : Alert, cooperative, no distress, appears stated age   Head:  Normocephalic/atraumatic    Eyes: PERRL, conjunctiva/corneas clear, EOM's intact. Fundi could not be visualized Neck: Supple Chest:  Respirations unlabored Chest wall: no tenderness or deformity Heart: Regular rate and rhythm Abdomen: Soft, nontender and nondistended Extremities: warm and well-perfused Skin: normal turgor, color and texture Neurologic:  Alert, oriented x 3.  Eyes open spontaneously. PERRL, EOMI, VFC, no facial droop. V1-3 intact.  No dysarthria, tongue protrusion symmetric.  CNII-XII intact. Right pronator drift       Data ReviewCBC:  Lab Results  Component Value Date   WBC 6.3 09/08/2021   RBC 4.40 09/08/2021   BMP:  Lab Results  Component Value Date   GLUCOSE 94 09/08/2021   CO2 25 09/08/2021   BUN 16 09/08/2021   CREATININE  0.62 09/08/2021   CREATININE 0.66 05/21/2020   CALCIUM 8.3 (L) 09/08/2021    Assessment:   Active Problems:   * No active hospital problems. *  Right temporal lobe tumor   Plan:   - tumor resection today

## 2021-09-15 NOTE — Anesthesia Procedure Notes (Signed)
Arterial Line Insertion Start/End1/24/2023 6:55 AM, 09/15/2021 7:05 AM Performed by: Myrtie Soman, MD, Renato Shin, CRNA, CRNA  Patient location: Pre-op. Preanesthetic checklist: patient identified, IV checked, site marked, risks and benefits discussed, surgical consent, monitors and equipment checked, pre-op evaluation, timeout performed and anesthesia consent Lidocaine 1% used for infiltration radial was placed Catheter size: 20 G Hand hygiene performed  and maximum sterile barriers used   Attempts: 1 Procedure performed without using ultrasound guided technique. Following insertion, dressing applied and Biopatch. Post procedure assessment: normal and unchanged  Patient tolerated the procedure well with no immediate complications. Additional procedure comments: Placed by Lucita Ferrara, SRNA under supervision of CRNA.

## 2021-09-15 NOTE — Transfer of Care (Signed)
Immediate Anesthesia Transfer of Care Note  Patient: Patricia Stevenson  Procedure(s) Performed: Right Craniotomy Temporal Lobectomy with Brain Lab (Right) APPLICATION OF CRANIAL NAVIGATION (Right)  Patient Location: PACU  Anesthesia Type:General  Level of Consciousness: awake and drowsy  Airway & Oxygen Therapy: Patient Spontanous Breathing and Patient connected to face mask oxygen  Post-op Assessment: Report given to RN and Post -op Vital signs reviewed and stable  Post vital signs: Reviewed and stable  Last Vitals:  Vitals Value Taken Time  BP 143/77 09/15/21 1420  Temp    Pulse 70 09/15/21 1427  Resp 21 09/15/21 1427  SpO2 98 % 09/15/21 1427  Vitals shown include unvalidated device data.  Last Pain:  Vitals:   09/15/21 0625  TempSrc:   PainSc: 0-No pain         Complications: No notable events documented.

## 2021-09-15 NOTE — Consult Note (Signed)
NAME:  Patricia Stevenson, MRN:  [id number], DOB:  [birth year], LOS: 0 ADMISSION DATE:  09/15/2021, CONSULTATION DATE:  09/15/2021 REFERRING MD:  Merrily Pew, NP, CHIEF COMPLAINT:  AMS   History of Present Illness:   61 year old Hispanic speaking female with prior history of HTN and HLD.  She was recently admitted 1/17 to 1/19 after having intermittent left sided weakness and left facial droop, near syncopal episodes at home, and family reports weight loss over the last year; no hx of seizures.  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  She returned 1/24 for a right craniotomy with tumor resection by Dr. Marcello Moores.  Underwent surgery without complication and was extubated postoperatively.  While in PACU, she was noted to have mental status change and was not following commands.  Went for stat CT head which revealed expected postop findings.  She was then transferred to ICU and PCCM consulted.   Pertinent  Medical History  R temporal brain mass, ? HTN but not on home meds   Significant Hospital Events: Including procedures, antibiotic start and stop dates in addition to other pertinent events   1/24 right craniotomy with right temporal lobe resection   Interim History / Subjective:   Objective   Blood pressure 129/64, pulse 83, temperature 97.7 F (36.5 C), resp. rate 20, height 5\' 2"  (1.575 m), weight 56.7 kg, SpO2 98 %.        Intake/Output Summary (Last 24 hours) at 09/15/2021 1659 Last data filed at 09/15/2021 1520 Gross per 24 hour  Intake 1921 ml  Output 1750 ml  Net 171 ml   Filed Weights   09/15/21 0545 09/15/21 0602  Weight: 56.7 kg 56.7 kg   Examination: Daughter at bedside who speaks English  General:  Adult female lying in bed in NAD, large R crani dressing dry with hemovac HEENT: MM pink/ dry, pupils 3/reactive, anicteric  Neuro:  arouses to noxious stimuli, moving right more than left, mostly incomprehensible  speech/ moans CV:  irir, +2 distal pulses, right radial aline- positional  PULM:  non labored, on room air, clear anteriorly, diminished in bases GI: soft, bs hypo, foley- cyu Extremities: warm/dry, no LE edema  Skin: no rashes   Resolved Hospital Problem list    Assessment & Plan:   Right temporal lobe mass s/p R craniotomy and tumor resection  - surgery uncomplicated, mental status change in PACU  Postop encephalopathy P:  - postop CTH with expected postop changes  - given dilaudid 0.5mg  in PACU- narcan given with improvement in mental status, moving left side more, R still stronger, following commands, and more responsive - will order narcan prn  - check ABG to rule out hypercarbia - SBP goal < 140, cardene and prn labetalol prn  - postop abx per NSGY- ancef q 8 x 6 doses - decadron and keppra per NSGY - seizure and aspiration precautions  - multimodal pain control: tylenol, vicodin and morphine (family states she is sensitive to meds) w/ bowel regimen - f/u MRI on 1/25 - follow surgical pathology    New onset Afib with RVR - EKG - continue telemetry monitoring - Mag 2gm now and check level in am, goal K > 4, Mag > 2 - cardizem gtt for rate control  - her CHA2DS2-VASc score is 1 (for age) with adjusted stroke rate of 1.3% per year.     Best Practice (right click and "Reselect  all SmartList Selections" daily)   Diet/type: NPO- advance as tolerated DVT prophylaxis: SCD GI prophylaxis: PPI Lines: Arterial Line Foley:  N/A Code Status:  full code Last date of multidisciplinary goals of care discussion [per primary ]  Labs   CBC: Recent Labs  Lab 09/08/21 1745 09/15/21 0826 09/15/21 1100  WBC 6.3  --   --   HGB 13.1 12.2 13.6  HCT 40.5 36.0 40.0  MCV 92.0  --   --   PLT 220  --   --     Basic Metabolic Panel: Recent Labs  Lab 09/08/21 1745 09/15/21 0826 09/15/21 1100  NA  --  136 138  K  --  3.7 4.2  CREATININE 0.62  --   --    GFR: Estimated  Creatinine Clearance: 60.6 mL/min (by C-G formula based on SCr of 0.62 mg/dL). Recent Labs  Lab 09/08/21 1745  WBC 6.3    Liver Function Tests: No results for input(s): AST, ALT, ALKPHOS, BILITOT, PROT, ALBUMIN in the last 168 hours. No results for input(s): LIPASE, AMYLASE in the last 168 hours. No results for input(s): AMMONIA in the last 168 hours.  ABG    Component Value Date/Time   PHART 7.426 09/15/2021 1100   PCO2ART 33.5 09/15/2021 1100   PO2ART 277 (H) 09/15/2021 1100   HCO3 22.1 09/15/2021 1100   TCO2 23 09/15/2021 1100   ACIDBASEDEF 2.0 09/15/2021 1100   O2SAT 100.0 09/15/2021 1100     Coagulation Profile: No results for input(s): INR, PROTIME in the last 168 hours.  Cardiac Enzymes: No results for input(s): CKTOTAL, CKMB, CKMBINDEX, TROPONINI in the last 168 hours.  HbA1C: Hemoglobin A1C  Date/Time Value Ref Range Status  05/08/2021 10:06 AM 5.5 4.0 - 5.6 % Final    CBG: No results for input(s): GLUCAP in the last 168 hours.  Review of Systems:   Unable   Past Medical History:  She,  has a past medical history of Hypertension.   Surgical History:   Past Surgical History:  Procedure Laterality Date   BREAST BIOPSY Left    20 years ago in [place]- benign- not sure if bx or cyst     Social History:   reports that she has never smoked. She has never used smokeless tobacco. She reports that she does not drink alcohol and does not use drugs.   Family History:  Her family history is negative for Breast cancer.   Allergies Allergies  Allergen Reactions   Latex      Home Medications  Prior to Admission medications   Medication Sig Start Date End Date Taking? Authorizing Provider  Ascorbic Acid (VITA-C PO) Take 1 tablet by mouth daily.   Yes [provider]  calcium carbonate (OSCAL) 1500 (600 Ca) MG TABS tablet Take by mouth 2 (two) times daily with a meal.   Yes [provider]  cholecalciferol (VITAMIN D3) 25 MCG (1000  UNIT) tablet Take 1,000 Units by mouth daily.   Yes [provider]  dexamethasone (DECADRON) 4 MG tablet Take 1 tablet (4 mg total) by mouth 3 (three) times daily. 09/10/21 09/10/22 Yes Marvis Moeller, NP  dicyclomine (BENTYL) 10 MG capsule Take 1 capsule (10 mg total) by mouth in the morning and at bedtime. As needed 09/03/21  Yes Nandigam, Venia Minks, MD  famotidine (PEPCID) 20 MG tablet Take 1 tablet (20 mg total) by mouth 2 (two) times daily. 09/03/21  Yes Mauri Pole, MD  levETIRAcetam (KEPPRA) 500  MG tablet Take 1 tablet (500 mg total) by mouth 2 (two) times daily. 09/10/21  Yes Marvis Moeller, NP  acetaminophen (TYLENOL) 325 MG tablet Take 650 mg by mouth every 6 (six) hours as needed for moderate pain.    [provider]     Critical care time: 35 mins     Kennieth Rad, ACNP [facility] 09/15/2021, 4:59 PM  See Amion for pager If no response to pager, please call PCCM consult pager After 7:00 pm call Elink

## 2021-09-15 NOTE — Progress Notes (Addendum)
Patient displayed change in mental status/responsiveness around 1540 while preparing to transport patient to 4N-ICU. Patient would not follow commands to open eyes or move her arms/legs, which she had previously done. VSS. Dr. Marcello Moores at the bedside to evaluate the patient. Left PACU around 1545 to bring patient to CT per MD order attached to monitors. Patient tolerated CT well, VSS. Patient then transported to 4N-ICU, VSS.

## 2021-09-15 NOTE — Op Note (Signed)
Procedure(s): Right Craniotomy Temporal Lobectomy with Brain Lab APPLICATION OF CRANIAL NAVIGATION Procedure Note  Patricia Stevenson female 61 y.o. 09/15/2021  Procedure(s) and Anesthesia Type:    * Right Craniotomy Temporal Lobectomy with Brain Lab - General    * APPLICATION OF CRANIAL NAVIGATION - General  Surgeon(s) and Role:    Marcello Moores, Dorcas Carrow, MD - Primary   Indications: This is a 61 year old woman who presented with left-sided weakness and cognitive decline and was found to have a large infiltrative right temporal lobe lesion suspicious for glioma.  I had a long discussion with the patient and her family.  For purposes of cytoreduction to help with long-term tumor control as well as to obtain a diagnosis, I recommended a subtotal resection surgery.  The general technique of surgery was discussed.  Risk, benefits, alternatives, and expected convalescence were discussed.  Risk discussed included, but were not limited to, bleeding, pain, infection, seizure, scar, stroke, neurologic deficit, cranial neuropathy, coma, and death.  Informed consent was obtained.     Surgeon: Vallarie Mare   Assistants: Weston Brass, NP.  Please note, there were no qualified trainees available to assist with the procedure.  He assisted with opening and closure.  Anesthesia: General endotracheal anesthesia  ASA Class: 2    Procedure Detail  Right Craniotomy Temporal Lobectomy with Brain Lab, APPLICATION OF CRANIAL NAVIGATION  The patient was brought to the op room.  General anesthesia was induced and patient was intubated by the anesthesia service.  After appropriate lines and monitors were placed, patient was positioned supine with head turned to the left.  The head was placed in [place] head holder and affixed to the bed.  A thin cut preoperative MRI was reconstructed into a 3D image.  This allowed for surface match registration with the BrainLab system, allowing for neuro navigation.  The  surface projection of the tumor on the skin was delineated and a standard question mark shaped scalp incision was planned.  The scalp was preprepped with alcohol and prepped and draped in sterile fashion.  A timeout was performed.  Preoperative antibiotics, dexamethasone, mannitol, and Keppra were given.  1% lidocaine with epinephrine was injected into the skin.  Incision made with a 10 blade and the galea was opened sharply.  The anterior branch of the superficial temporal artery was identified and bipolared and cut.  The temporalis muscle was opened leaving a superior cuff and it was dissected off of the skull.  Fishhooks were used to hold the myocutaneous flap in place.  Bur holes were placed at the keyhole, squamosal temporal bone, and 1 more posteriorly and the dura was dissected from the inner table skull.  Craniotome was used to perform craniotomy.  Small amount of additional squamosal temporal bone more inferiorly was removed with rongeurs.  Meticulous epidural hemostasis was obtained.  The dura was opened in C-shaped manner and flapped forward.  The right temporal lobe appeared significantly swollen as expected.  Navigation was used to  plan the posterior margin of the cortical resection.  The pia was bipolared and cut over the superior temporal gyrus all the way to the temporal tip and a coronal cut was made at the posterior margin of the lesion.  The collateral sulcus was identified and the temporal horn was identified.  After the corticotomy of the superior temporal gyrus, the sylvian fissure was entered and the MCA vessels were dissected from the superior temporal gyrus which was bulging into the fissure.  Small branches  to the temporal lobe for coagulated and cut and care was taken to preserve the M2 branches.  The corticotomy cuts were then connected along the medial border from the temporal horn going forward to the temporal tip.  The temporal near cortex was resected in 1 piece and sent for frozen  and permanent specimen.  Frozen section review identified hypercellular glial tissue and vascular proliferation.  The microscope was then introduced into the field.  The parahippocampal gyrus and the uncus was then debulked with the ultrasonic aspirator.  Following the MCA branches to the M1 and the ICA terminus, the ICA was dissected from the medial temporal lobe.  The posterior communicating artery was identified and dissected with Rhoton dissector's from the medial temporal lobe.  The 3rd nerve was identified and gently dissected from the medial temporal lobe.  The parahippocampal gyrus was very rubbery in consistency and was knuckling over the 3rd nerve and P-comm so only the proximal portion of the P-comm and the distal portion of the 3rd nerve could be safely dissected.  Additionally, it was difficult to find a plane in the ambient cistern to dissected the tumor from the midbrain.  As such, I felt it was prudent to leave a shell of tumor along the medial portion of the uncus, debulking with an ultrasonic aspirator.  To minimize morbidity of surgery, I did not debulk lesion in the temporal stem superior to the level of the M1 or in the insula or in the lateral perforated substance superior to the M1.  Additional involved parahippocampal gyrus was removed by angling the resection posteriorly to the posterior incisura.  This tumor was more soft in consistency and easier to resect.  Anterior choroidal artery branches to the hippocampal fissure were identified and spared.  At this point, a thorough debulking was accomplished and meticulous hemostasis was obtained.  The dura was then closed with 4-0 Nurolon in interrupted fashion followed by DuraGen plus and Adherus spray.   The bone flap was replaced with Stryker cranial plating system.  A Hemovac drain was placed in the subcu muscular space and tunneled out the skin and secured with stitch.  The temporalis was closed with 2-0 Vicryl stitches.  The galea was closed  2-0 Vicryl stitch in buried infra fashion.  The skin was closed with staples.  A sterile dressing was placed.  Patient was removed from the [place] head holder and then extubated by the anesthesia service.  All counts were correct at the end of surgery.  No complications were noted.  findings: Involved brain with rubbery consistency and mildly hypervascular.  Estimated Blood Loss:  200 mL         Drains: HEMOVAC         Total IV Fluids: See anesthesia records  Blood Given: none          Specimens: Right temporal neocortex and right parahippocampal gyrus         Implants: Stryker cranial plating system        Complications:  * No complications entered in OR log *         Disposition: PACU - hemodynamically stable.         Condition: stable

## 2021-09-15 NOTE — Progress Notes (Incomplete)
09/15/2021   I have seen and evaluated the patient for AMS.  S:  61 year old woman who has been having some alternating weakness and behavioral changes.  Workup has revealed right temporal lobe mass with midline shift.  She went for resection today.  Tolerated surgery well in PACU but was in pain so got dilaudid.  When she was to be transferred she was noted to have decreased responsiveness.  Sent for CT head, expected postop changes.  PCCM consulted to assist with AMS.  O: Blood pressure 129/64, pulse 83, temperature 97.7 F (36.5 C), resp. rate 20, height 5\' 2"  (1.575 m), weight 56.7 kg, SpO2 98 %.  Before Narcan: Somnolent but withdraws, better on R  After narcan: Wakes up, follows commands, answers questions L hemineglect and weakness Heart sounds fast, irregular, ext warm Cranial drain in place Ext warm Abdoment soft  A:  POD 0 R temporal lobe mass resection Postop AMS question narcotic effect given near resolution with narcan Postop afib/RVR- EKG pending Hx HTN  P:  - Switch nicardipine to diltiazem given afib/RVR, goal HR < 110, SBP < 140 - Seizure precautions - More cautious with opiates - Will check on her tomorrow to assure continued stability  Erskine Emery MD [facility] Prefer epic messenger for cross cover needs If after hours, please call E-link

## 2021-09-16 ENCOUNTER — Encounter (HOSPITAL_COMMUNITY): Payer: Self-pay | Admitting: Neurosurgery

## 2021-09-16 ENCOUNTER — Inpatient Hospital Stay (HOSPITAL_COMMUNITY): Payer: Self-pay

## 2021-09-16 DIAGNOSIS — I10 Essential (primary) hypertension: Secondary | ICD-10-CM | POA: Diagnosis present

## 2021-09-16 DIAGNOSIS — I4891 Unspecified atrial fibrillation: Secondary | ICD-10-CM

## 2021-09-16 LAB — MAGNESIUM: Magnesium: 2.6 mg/dL — ABNORMAL HIGH (ref 1.7–2.4)

## 2021-09-16 LAB — ECHOCARDIOGRAM COMPLETE
AR max vel: 1.73 cm2
AV Area VTI: 1.82 cm2
AV Area mean vel: 1.69 cm2
AV Mean grad: 3 mmHg
AV Peak grad: 5.3 mmHg
Ao pk vel: 1.15 m/s
Height: 62 in
S' Lateral: 2.1 cm
Weight: 2000 oz

## 2021-09-16 LAB — CBC WITH DIFFERENTIAL/PLATELET
Abs Immature Granulocytes: 0.08 10*3/uL — ABNORMAL HIGH (ref 0.00–0.07)
Basophils Absolute: 0 10*3/uL (ref 0.0–0.1)
Basophils Relative: 0 %
Eosinophils Absolute: 0 10*3/uL (ref 0.0–0.5)
Eosinophils Relative: 0 %
HCT: 38 % (ref 36.0–46.0)
Hemoglobin: 12.5 g/dL (ref 12.0–15.0)
Immature Granulocytes: 1 %
Lymphocytes Relative: 7 %
Lymphs Abs: 1.1 10*3/uL (ref 0.7–4.0)
MCH: 29.6 pg (ref 26.0–34.0)
MCHC: 32.9 g/dL (ref 30.0–36.0)
MCV: 90 fL (ref 80.0–100.0)
Monocytes Absolute: 1.2 10*3/uL — ABNORMAL HIGH (ref 0.1–1.0)
Monocytes Relative: 8 %
Neutro Abs: 13.6 10*3/uL — ABNORMAL HIGH (ref 1.7–7.7)
Neutrophils Relative %: 84 %
Platelets: 240 10*3/uL (ref 150–400)
RBC: 4.22 MIL/uL (ref 3.87–5.11)
RDW: 13.2 % (ref 11.5–15.5)
WBC: 16 10*3/uL — ABNORMAL HIGH (ref 4.0–10.5)
nRBC: 0 % (ref 0.0–0.2)

## 2021-09-16 LAB — BASIC METABOLIC PANEL
Anion gap: 10 (ref 5–15)
BUN: 17 mg/dL (ref 6–20)
CO2: 21 mmol/L — ABNORMAL LOW (ref 22–32)
Calcium: 8.5 mg/dL — ABNORMAL LOW (ref 8.9–10.3)
Chloride: 103 mmol/L (ref 98–111)
Creatinine, Ser: 0.61 mg/dL (ref 0.44–1.00)
GFR, Estimated: >60 mL/min (ref >60)
Glucose, Bld: 143 mg/dL — ABNORMAL HIGH (ref 70–99)
Potassium: 4 mmol/L (ref 3.5–5.1)
Sodium: 134 mmol/L — ABNORMAL LOW (ref 135–145)

## 2021-09-16 IMAGING — MR MR HEAD W/O CM
6 of 11 series · 24 of 48 positions shown · non-contrast
Comparison: CT head [DATE].  MRI head [DATE]

CLINICAL DATA: Postop tumor resection [DATE].

EXAM:
MRI HEAD WITHOUT CONTRAST
TECHNIQUE: Multiplanar, multiecho pulse sequences of the brain and surrounding
structures were obtained without intravenous contrast.

[Series 2: DWI · axial · 3.0mm · 0.94mm/px · z∈[-83,+63]mm · 7 of 100 slices shown (1 of 2)]
[im 1/100]
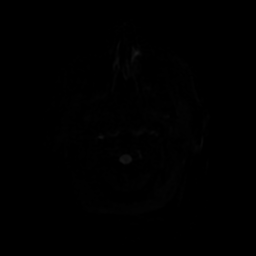
[im 17/100]
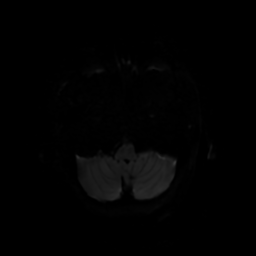
[im 34/100]
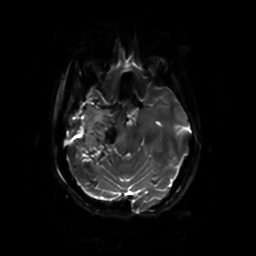
[im 50/100]
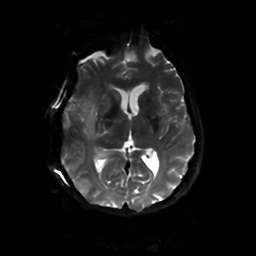
[im 67/100]
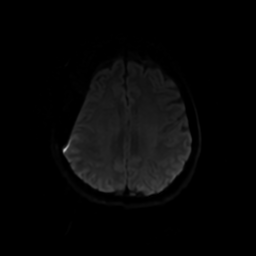
[im 83/100]
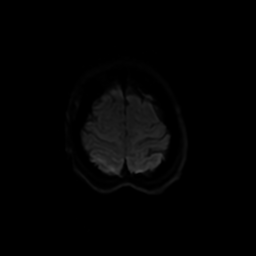
[im 100/100]
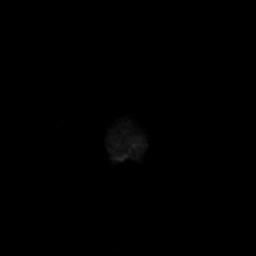

[Series 3: DWI · coronal · 4.0mm · 0.94mm/px · 5 of 72 slices shown (2 of 2)]
[im 1/72]
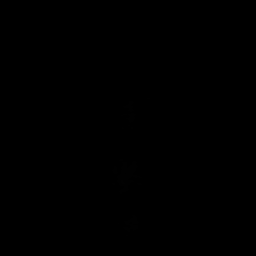
[im 18/72]
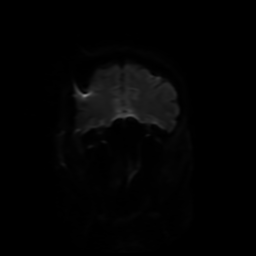
[im 36/72]
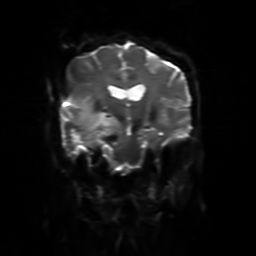
[im 54/72]
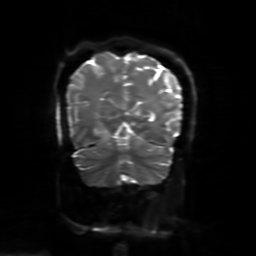
[im 72/72]
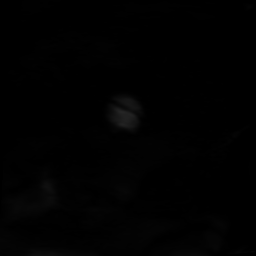

[Series 6: FLAIR · axial · 4.0mm · 0.45mm/px · z∈[-95,+56]mm · 3 of 36 slices shown (1 of 2)]
[im 1/36]
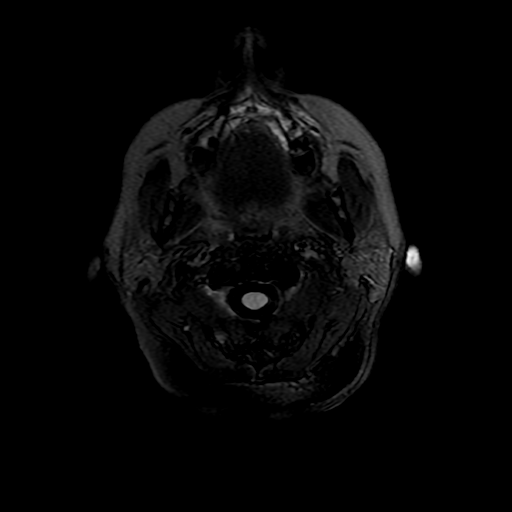
[im 18/36]
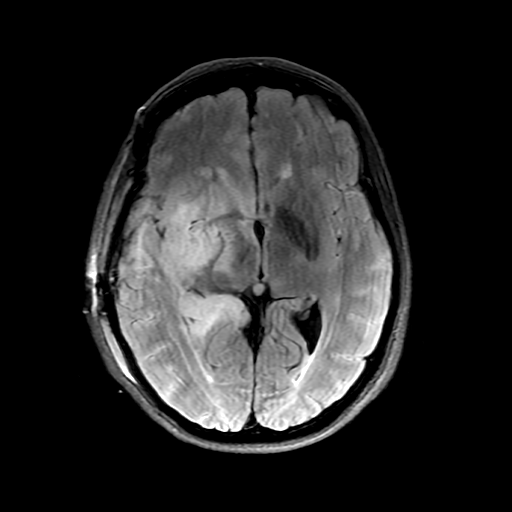
[im 36/36]
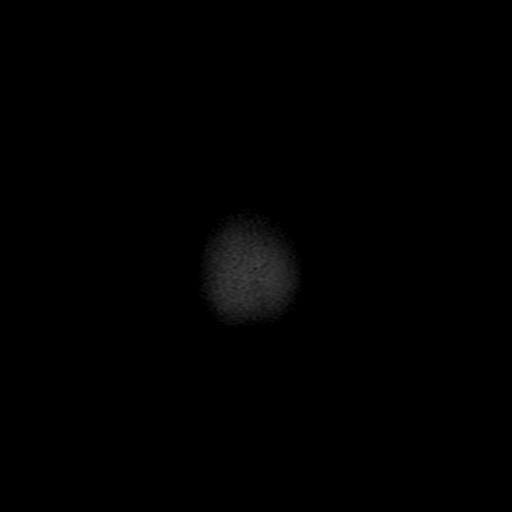

[Series 10: FLAIR · sagittal · 5.0mm · 0.23mm/px · 2 of 28 slices shown (2 of 2)]
[im 1/28]
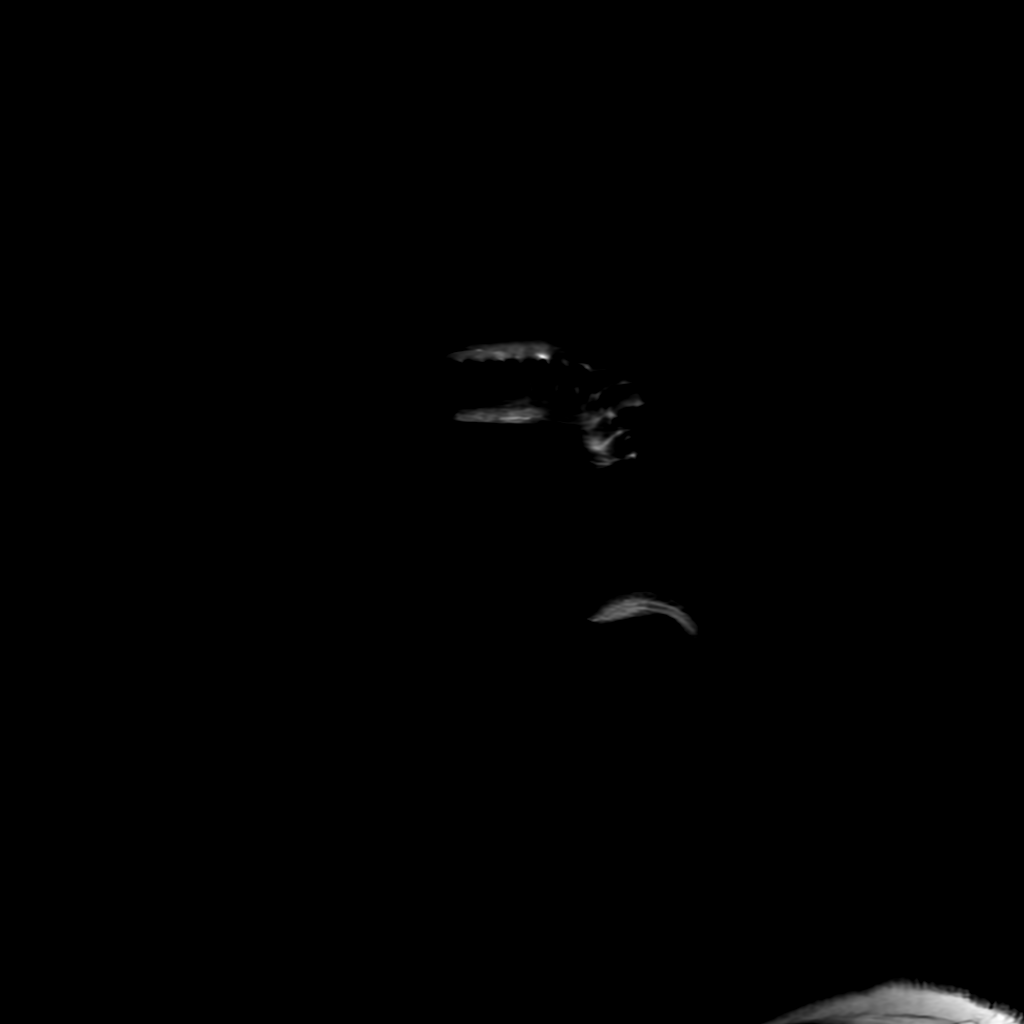
[im 28/28]
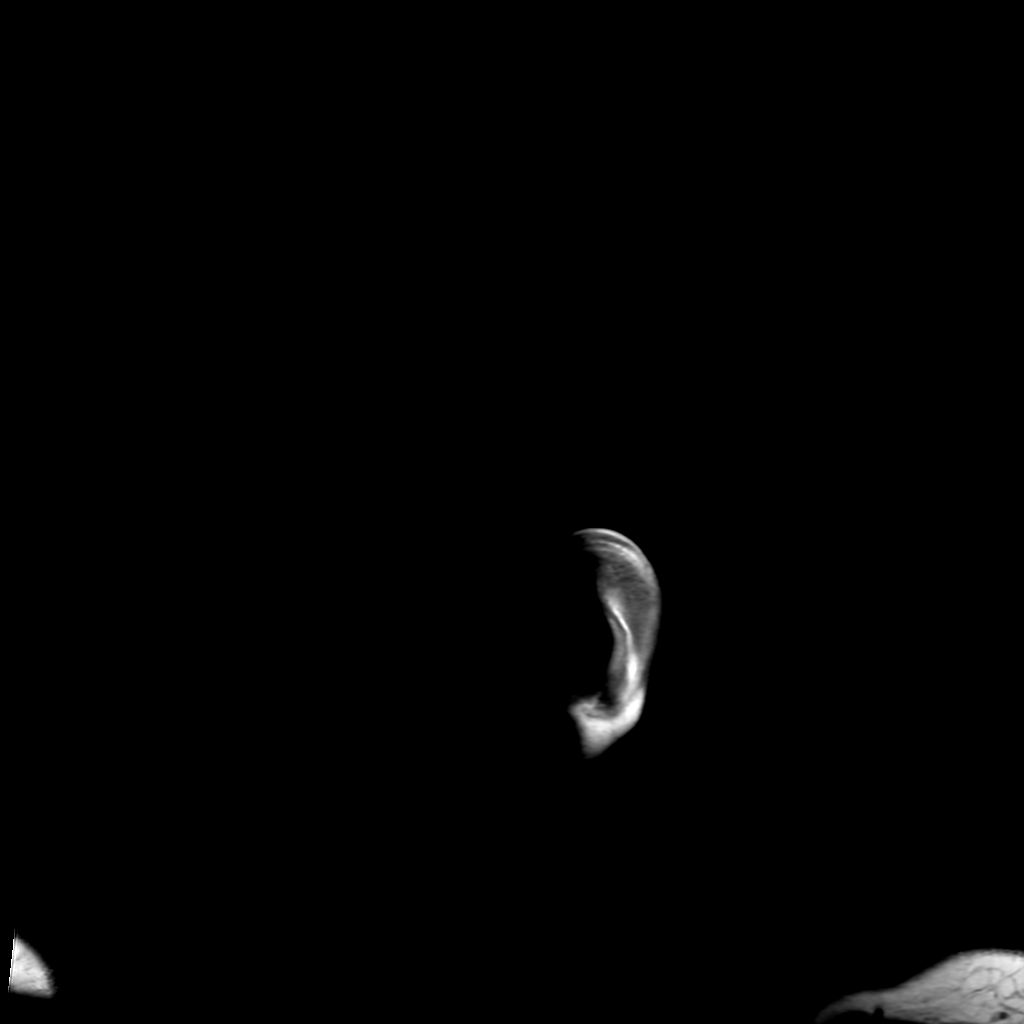

[Series 250: ADC · axial · 3.0mm · 0.94mm/px · z∈[-83,+63]mm · 4 of 50 slices shown (1 of 2)]
[im 1/50]
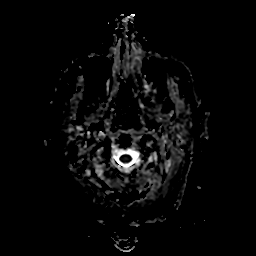
[im 17/50]
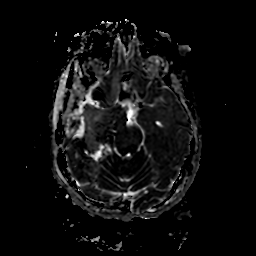
[im 33/50]
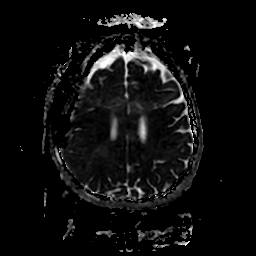
[im 50/50]
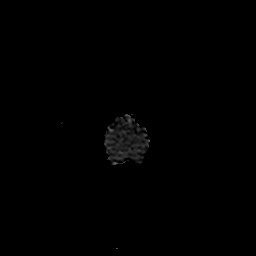

[Series 350: ADC · coronal · 4.0mm · 0.94mm/px · 3 of 36 slices shown (2 of 2)]
[im 1/36]
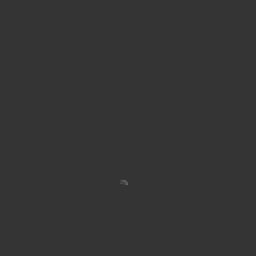
[im 18/36]
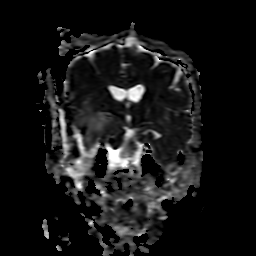
[im 36/36]
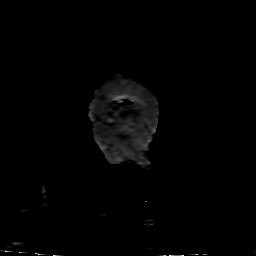

[24 of 48 positions shown; findings below may reference images not displayed]

FINDINGS: Brain: Right temporal craniotomy for tumor debulking in the right
temporal lobe. Large tumor is present which has been partially
resected. There is hemorrhage within the posterior aspect of the
tumor. Right temporal subdural fluid collection is similar to the CT
yesterday. Decreased mass-effect compared with the preoperative
study. Minimal midline shift to the left has improved. Tumor extends
into the prepontine cistern on the right with mass-effect on the
right pons and midbrain. There is T2 hyperintensity in the midbrain
and pons on the right which may be due to edema or tumor spread.
This was present preoperatively

Within the lateral aspect of the tumor, there is restricted
diffusion compatible with acute infarct in the right temporal lobe
laterally. Ventricle size remains normal.

Vascular: Normal arterial flow voids at the skull base.

Skull and upper cervical spine: Right temporal craniotomy.

Sinuses/Orbits: Mild mucosal edema paranasal sinuses. Mastoid clear
bilaterally. Negative orbit

Other: None
IMPRESSION: 1. Postop debulking of large right temporal lobe tumor. There is
decreased size of tumor and decreased mass-effect and midline shift.
2. Right temporal subdural hematoma as noted on CT yesterday. Mild
amount of restricted diffusion in the right lateral temporal lobe
compatible with ischemic change.

## 2021-09-16 MED ORDER — PANTOPRAZOLE SODIUM 40 MG PO TBEC
40.0000 mg | DELAYED_RELEASE_TABLET | Freq: Every day | ORAL | Status: DC
  Administered 2021-09-16 – 2021-09-18 (×3): 40 mg via ORAL
  Filled 2021-09-16 (×3): qty 1

## 2021-09-16 MED ORDER — DILTIAZEM HCL 30 MG PO TABS
30.0000 mg | ORAL_TABLET | Freq: Four times a day (QID) | ORAL | Status: DC
  Administered 2021-09-16 – 2021-09-17 (×6): 30 mg via ORAL
  Filled 2021-09-16 (×10): qty 1

## 2021-09-16 MED FILL — Thrombin For Soln 5000 Unit: CUTANEOUS | Qty: 5000 | Status: AC

## 2021-09-16 NOTE — Assessment & Plan Note (Signed)
Pathology is pending

## 2021-09-16 NOTE — Assessment & Plan Note (Addendum)
1/24 status post right craniectomy with temporal lobectomy with BrainLab by Dr. Marcello Moores Awake and alert.  Conversant.  Mild left-sided weakness.  Plan: -Continue dexamethasone -Progressive ambulation, PT OT

## 2021-09-16 NOTE — Progress Notes (Incomplete)
STUDENT FOLLOW-UP NOTES - DISREGARD                    CC/HPI:  38 YOF presented to ED with complaints of intermittent left-sided weakness, intermittent left-sided facial droop and tremors. Her family endorses emotional changes and near syncopal episodes. CT showed R temporal lobe lesion suspicious for glioma. Now s/p temporal lobectomy 1/24 and when transferred to ICU was noted to have decreased responsiveness.   PMH: HTN, HLD, breast cancer, IBS-C?  Dx:   24h A:  No events overnight other than intermittent cath x2 since foley removal (bladder scan 440 mL) Per Neuro: continue keppra indefinitely, decrease dex 4 q8h Per cards, no need for [facility] d/t [facility]ADSV[facility]   PCCM signed off, transfer off the floor today  PLAN:  -Diltiazem 30mg  q6h consolidated to 120mg  qd; cards signed off -start urinary retention protocol if repeat caths  -UOP 1.1L/24h -restart PTA bentyl (anticholingergic)? D/c bowel regimen?      Anticoag: SCDs 1/26 Hgb 13.1, Plts 239 1/27 Hgb 12.8, Plts 244  ID:  WBC 16 (scheduled steroids) 1/26 WBC 12.8, afebrile 1/27 11.3, afebrile  Antimicrobials this admission:  Cefazolin 1g q8h 1/24 >> 1/26 (post-op)  Microbiology results: 1/24 Resp. Panel: negative 1/24 MRSA PCR: negative  CV: as above; LDL 115, statin?, EF 65-70% Endocrine: A1c 5.5, CBG 143, scheduled steroids GI/Nutrition:  No LBM since 1/24, PPI/Docusate/Miralax; soft, non-tender, active bowel sounds, CTM Norco LD 1/25; avoid opioids Neuro: as above, RASS -1 Renal: SCr 0.61, Na 134 1/26 SCr 0.62, Na 135 1/27 SCr 0.64  Pulm: upper 90s on Room air  Heme/Onc:   PTA Med Issues: Vit C, Oscal, Vit D3, Dexamethasone, Bentyl 10mg  BID, Famotidine 20mg  BID PRN   Additional Notes:

## 2021-09-16 NOTE — Progress Notes (Signed)
Subjective: Patient reports mild/moderate headache.  Objective: Vital signs in last 24 hours: Temp:  [96.6 F (35.9 C)-98 F (36.7 C)] 98 F (36.7 C) (01/25 0800) Pulse Rate:  [59-108] 82 (01/25 1100) Resp:  [8-29] 18 (01/25 1100) BP: (84-143)/(48-83) 119/70 (01/25 1100) SpO2:  [94 %-100 %] 97 % (01/25 1100) Arterial Line BP: (88-166)/(61-83) 166/75 (01/25 0900)  Intake/Output from previous day: 01/24 0701 - 01/25 0700 In: 2241.1 [I.V.:1570.2; IV Piggyback:670.9] Out: 2875 [Urine:2575; Drains:200; Blood:100] Intake/Output this shift: Total I/O In: 51 [I.V.:15; IV Piggyback:36] Out: -   Awake, alert, oriented to person, date, place.  3/5 LUE and LLE strength.  Speech fluent.  Dressings c/d.  Drain output 200 ml, serosanguinous.  PERRL, EOMI.  Lab Results: Recent Labs    09/15/21 1817 09/16/21 0551  WBC  --  16.0*  HGB 12.9 12.5  HCT 38.0 38.0  PLT  --  240   BMET Recent Labs    09/15/21 1817 09/16/21 0551  NA 138 134*  K 3.7 4.0  CL  --  103  CO2  --  21*  GLUCOSE  --  143*  BUN  --  17  CREATININE  --  0.61  CALCIUM  --  8.5*    Studies/Results: CT HEAD WO CONTRAST (5MM)  Result Date: 09/15/2021 CLINICAL DATA:  Postop brain biopsy today. Mental status change. Brain mass. EXAM: CT HEAD WITHOUT CONTRAST TECHNIQUE: Contiguous axial images were obtained from the base of the skull through the vertex without intravenous contrast. RADIATION DOSE REDUCTION: This exam was performed according to the departmental dose-optimization program which includes automated exposure control, adjustment of the mA and/or kV according to patient size and/or use of iterative reconstruction technique. COMPARISON:  CT head 09/08/2021.  MRI head 09/08/2021. FINDINGS: Brain: Postop right temporal craniotomy for tumor resection. There appears to have been debulking of the tumor which is difficult to see on unenhanced CT. Previously the tumor was hyperdense and centered in the medial temporal  lobe with surrounding edema. There is improvement in mass-effect and midline shift which is now mild to the left. Interval development of subdural pneumocephalus anteriorly bilaterally. Interval development of right temporal subdural hematoma measuring approximately 11 mm in thickness. Small amount of subarachnoid hemorrhage in the sylvian fissures. Small amount of gas in the suprasellar cistern and sylvian fissures. Ventricle size remains normal. No acute infarct. Vascular: Negative for hyperdense vessel Skull: Right temporal craniotomy. Craniotomy flap is well position. No subcutaneous fluid collection. Sinuses/Orbits: Mild mucosal edema left maxillary sinus. Remaining sinuses clear. Negative orbit bilaterally. Other: None IMPRESSION: Postop tumor resection in the right temporal lobe. Postop pneumocephalus. Postop subdural hematoma right temporal lobe measuring 11 mm in thickness. Mild amount of subarachnoid hemorrhage. Improved mass-effect and midline shift compared with the preop study. Electronically Signed   By: Franchot Gallo M.D.   On: 09/15/2021 16:14    Assessment/Plan: S/p right temporal lobectomy for tumor - continue dexamethasone, will switch to PO meds and slowly wean - cont Keppra - MRI today - EEG if has episodic decline in mentation - likely will d/c drain tomorrow - continue ICU care   Patricia Stevenson 09/16/2021, 11:56 AM

## 2021-09-16 NOTE — Hospital Course (Signed)
1/24 right craniotomy with right temporal lobe resection 1/24 developed postoperative rapid atrial fibrillation and started on IV diltiazem

## 2021-09-16 NOTE — Progress Notes (Signed)
Subjective: Patient reports that she is doing well. She currently has no complaints.   Objective: Vital signs in last 24 hours: Temp:  [96.6 F (35.9 C)-97.7 F (36.5 C)] 97.7 F (36.5 C) (01/25 0000) Pulse Rate:  [59-108] 79 (01/25 0700) Resp:  [8-29] 17 (01/25 0700) BP: (84-143)/(48-83) 93/66 (01/25 0700) SpO2:  [94 %-100 %] 97 % (01/25 0700) Arterial Line BP: (88-159)/(61-83) 142/69 (01/25 0700)  Intake/Output from previous day: 01/24 0701 - 01/25 0700 In: 2241.1 [I.V.:1570.2; IV Piggyback:670.9] Out: 2875 [Urine:2575; Drains:200; Blood:100] Intake/Output this shift: No intake/output data recorded.  Physical Exam: Patient is drowsy but is able to maintain alertness while awake. She is O X 4. They are in NAD and VSS. Doing well. Speech is fluent and appropriate.  She is able to follow all commands easily.  MAEW. Sensation to light touch is intact. PERLA, EOMI. CNs grossly intact. Dressing is clean dry intact. Incision is well approximated with no drainage, erythema, or edema. Hemovac with approximately 150 ml of serous drainage.      Lab Results: Recent Labs    09/15/21 1817 09/16/21 0551  WBC  --  16.0*  HGB 12.9 12.5  HCT 38.0 38.0  PLT  --  240   BMET Recent Labs    09/15/21 1817 09/16/21 0551  NA 138 134*  K 3.7 4.0  CL  --  103  CO2  --  21*  GLUCOSE  --  143*  BUN  --  17  CREATININE  --  0.61  CALCIUM  --  8.5*    Studies/Results: CT HEAD WO CONTRAST (5MM)  Result Date: 09/15/2021 CLINICAL DATA:  Postop brain biopsy today. Mental status change. Brain mass. EXAM: CT HEAD WITHOUT CONTRAST TECHNIQUE: Contiguous axial images were obtained from the base of the skull through the vertex without intravenous contrast. RADIATION DOSE REDUCTION: This exam was performed according to the departmental dose-optimization program which includes automated exposure control, adjustment of the mA and/or kV according to patient size and/or use of iterative reconstruction  technique. COMPARISON:  CT head 09/08/2021.  MRI head 09/08/2021. FINDINGS: Brain: Postop right temporal craniotomy for tumor resection. There appears to have been debulking of the tumor which is difficult to see on unenhanced CT. Previously the tumor was hyperdense and centered in the medial temporal lobe with surrounding edema. There is improvement in mass-effect and midline shift which is now mild to the left. Interval development of subdural pneumocephalus anteriorly bilaterally. Interval development of right temporal subdural hematoma measuring approximately 11 mm in thickness. Small amount of subarachnoid hemorrhage in the sylvian fissures. Small amount of gas in the suprasellar cistern and sylvian fissures. Ventricle size remains normal. No acute infarct. Vascular: Negative for hyperdense vessel Skull: Right temporal craniotomy. Craniotomy flap is well position. No subcutaneous fluid collection. Sinuses/Orbits: Mild mucosal edema left maxillary sinus. Remaining sinuses clear. Negative orbit bilaterally. Other: None IMPRESSION: Postop tumor resection in the right temporal lobe. Postop pneumocephalus. Postop subdural hematoma right temporal lobe measuring 11 mm in thickness. Mild amount of subarachnoid hemorrhage. Improved mass-effect and midline shift compared with the preop study. Electronically Signed   By: Franchot Gallo M.D.   On: 09/15/2021 16:14    Assessment/Plan: Patient is post-op day 1 s/p Right craniotomy temporal lobectomy. While in PACU yesterday, the patient was noted to not be following commands.  She was sent for a follow-up head CT which revealed the expected evolution following her temporal lobectomy.  CCM was consulted for assistance with altered mental  status.  She was given a dose of Narcan and was able to wake up and follow commands.  She was also noted to be in AFib with RVR.  She was started on Cardizem.  This morning, the patient continues to follow commands and was able to interact  appropriately.  She continues to have some left-sided weakness as well as a slight right pronator drift.    -Continue Hemovac -Advance diet as tolerated -Discontinue foley, arterial line -Decadron 4mg  Q6H -Keppra 500 mg BID   LOS: 1 day     Marvis Moeller, DNP, NP-C 09/16/2021, 7:46 AM

## 2021-09-16 NOTE — Assessment & Plan Note (Signed)
On no home therapy. Systolic blood pressure in the 150s at present.  Plan: -Blood pressure should improve with initiation of diltiazem. -Target systolic blood pressure 482-500.  Blood pressure should come down further as Decadron is weaned.

## 2021-09-16 NOTE — Progress Notes (Signed)
°  Transition of Care [facility]) Screening Note   Patient Details  Name: JAVEN RIDINGS Date of Birth: [birth year]   Transition of Care [facility]) CM/SW Contact:    Benard Halsted, LCSW Phone Number: 09/16/2021, 8:32 AM    Transition of Care Department [facility]) has reviewed patient and no TOC needs have been identified at this time. We will continue to monitor patient advancement through interdisciplinary progression rounds. If new patient transition needs arise, please place a TOC consult.

## 2021-09-16 NOTE — Progress Notes (Addendum)
NAME:  Patricia Stevenson, MRN:  [id number], DOB:  Jan 29, 1961, LOS: 1 ADMISSION DATE:  09/15/2021, CONSULTATION DATE:  09/15/2021 REFERRING MD:  Merrily Pew, NP, CHIEF COMPLAINT:  AMS   History of Present Illness:   61 year old Hispanic speaking female with prior history of HTN and HLD.  She was recently admitted 1/17 to 1/19 after having intermittent left sided weakness and left facial droop, near syncopal episodes at home, and family reports weight loss over the last year; no hx of seizures.  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  She returned 1/24 for a right craniotomy with tumor resection by Dr. Marcello Moores.  Underwent surgery without complication and was extubated postoperatively.  While in PACU, she was noted to have mental status change and was not following commands.  Went for stat CT head which revealed expected postop findings.  She was then transferred to ICU and PCCM consulted.   Pertinent  Medical History  R temporal brain mass, ? HTN but not on home meds   Significant Hospital Events: Including procedures, antibiotic start and stop dates in addition to other pertinent events   1/24 right craniotomy with right temporal lobe resection   Interim History / Subjective:  Tmax 97.7 -491 Admission, 2.5L UOP, 100 Blood loss, 50 out drain  Dilt 7.5  Subjective: endorses slight headache, denies chest pain or SOB  Objective   Blood pressure 93/66, pulse 79, temperature 97.7 F (36.5 C), temperature source Axillary, resp. rate 17, height 5\' 2"  (1.575 m), weight 56.7 kg, SpO2 97 %.        Intake/Output Summary (Last 24 hours) at 09/16/2021 3790 Last data filed at 09/16/2021 0700 Gross per 24 hour  Intake 2241.12 ml  Output 2875 ml  Net -633.88 ml   Filed Weights   09/15/21 0545 09/15/21 0602  Weight: 56.7 kg 56.7 kg   Examination: General: In bed, NAD, appears comfortable HEENT: MM pink/moist, anicteric,  atraumatic Neuro: RASS 0, PERRL 21mm, GCS 15 CV: S1S2, Afib, no m/r/g appreciated PULM:  clear in the upper lobes, clear in the lower lobes, trachea midline, chest expansion symmetric GI: soft, bsx4 active, non-tender   Extremities: warm/dry, no pretibial edema, capillary refill less than 3 seconds  Skin:  no rashes or lesions noted, surgical dressings C/D/I  NA 138>134 WBC 16 MG 2.6 Resolved Hospital Problem list    Assessment & Plan:   Right temporal lobe mass s/p R craniotomy and tumor resection  - surgery uncomplicated, mental status change in PACU, postop CTH with expected postop changes   Postop encephalopathy- resolved P:  -Management per NSGY -SBP goal less than 140 continue cardine gtt and PRN labetalol -Post-op abx per NSGY -Decadron and keppra per NSGY -Seizure and aspiration precautions -MRI Planned 1/25 -Follow surgical pathology -Continue multimodal pain control: Acetaminophen, vicodin, and morphine with bowel regimen -Monitor HV output  New onset Afib with RVR Suspect secondary to surgical stress. Remains in afib -Continue cardizem gtt, starting PO Cardizem 30MG  TID today -Obtain ECHO -Will need cardiology evaluation  -Goal K above 4, goal MG above 2 -Continuous EKG monitoring -PRN EKG - her CHA2DS2-VASc score is 1 (for age) with adjusted stroke rate of 1.3% per year.    Hyponatremia NA 138>134 -Monitor on AM BMP  Leukocytosis WBC 16, expected post op, on steroids -monitor  Best Practice (right click and "Reselect all SmartList Selections" daily)   Diet/type: NPO- advance as  tolerated DVT prophylaxis: SCD GI prophylaxis: PPI Lines: Arterial Line Foley:  N/A Code Status:  full code Last date of multidisciplinary goals of care discussion [per primary ]   Critical care time: 49 mins     Lis Savitt Chancy Milroy., MSN, APRN, AGACNP-BC [facility]  09/16/2021 , 7:28 AM  Please see [email or web address] for pager details  If no  response, please call [phone] After hours, please call Elink at [phone]

## 2021-09-16 NOTE — Assessment & Plan Note (Addendum)
Started on IV diltiazem for rate control Heart rate currently between 100-110, remains in atrial fibrillation. Patient denies shortness of breath or palpitations. No JVD, heart sounds unremarkable.  No peripheral edema.  Extremities warm and well-perfused.  Plan: -Continue IV diltiazem to keep heart rate less than 120 -Initiate oral diltiazem 60 mg every 6 hours and wean IV Cardizem off. -Echocardiogram -Likely at high risk for long-term recurrence.  Will need long-term anticoagulation ideally.  Needs cardiology follow-up.

## 2021-09-17 ENCOUNTER — Encounter (HOSPITAL_COMMUNITY): Payer: Self-pay | Admitting: Neurosurgery

## 2021-09-17 LAB — CBC
HCT: 38.9 % (ref 36.0–46.0)
Hemoglobin: 13.1 g/dL (ref 12.0–15.0)
MCH: 29.8 pg (ref 26.0–34.0)
MCHC: 33.7 g/dL (ref 30.0–36.0)
MCV: 88.4 fL (ref 80.0–100.0)
Platelets: 239 10*3/uL (ref 150–400)
RBC: 4.4 MIL/uL (ref 3.87–5.11)
RDW: 13.2 % (ref 11.5–15.5)
WBC: 12.8 10*3/uL — ABNORMAL HIGH (ref 4.0–10.5)
nRBC: 0 % (ref 0.0–0.2)

## 2021-09-17 LAB — BASIC METABOLIC PANEL
Anion gap: 7 (ref 5–15)
BUN: 21 mg/dL — ABNORMAL HIGH (ref 6–20)
CO2: 26 mmol/L (ref 22–32)
Calcium: 8.8 mg/dL — ABNORMAL LOW (ref 8.9–10.3)
Chloride: 102 mmol/L (ref 98–111)
Creatinine, Ser: 0.62 mg/dL (ref 0.44–1.00)
GFR, Estimated: >60 mL/min (ref >60)
Glucose, Bld: 151 mg/dL — ABNORMAL HIGH (ref 70–99)
Potassium: 4.1 mmol/L (ref 3.5–5.1)
Sodium: 135 mmol/L (ref 135–145)

## 2021-09-17 LAB — TSH: TSH: 0.224 u[IU]/mL — ABNORMAL LOW (ref 0.350–4.500)

## 2021-09-17 LAB — MAGNESIUM: Magnesium: 2.3 mg/dL (ref 1.7–2.4)

## 2021-09-17 MED ORDER — DEXAMETHASONE 4 MG PO TABS
4.0000 mg | ORAL_TABLET | Freq: Three times a day (TID) | ORAL | Status: DC
  Administered 2021-09-17 – 2021-09-19 (×7): 4 mg via ORAL
  Filled 2021-09-17 (×7): qty 1

## 2021-09-17 MED ORDER — POLYETHYLENE GLYCOL 3350 17 G PO PACK
17.0000 g | PACK | Freq: Two times a day (BID) | ORAL | Status: DC
  Administered 2021-09-17 – 2021-09-19 (×5): 17 g via ORAL
  Filled 2021-09-17 (×5): qty 1

## 2021-09-17 MED ORDER — LEVETIRACETAM 500 MG PO TABS
500.0000 mg | ORAL_TABLET | Freq: Two times a day (BID) | ORAL | Status: DC
  Administered 2021-09-17 – 2021-09-19 (×4): 500 mg via ORAL
  Filled 2021-09-17 (×4): qty 1

## 2021-09-17 NOTE — Progress Notes (Signed)
Subjective: Patient reports mild right temporoparietal headaches. No acute events overnight.   Objective: Vital signs in last 24 hours: Temp:  [97.7 F (36.5 C)-98.5 F (36.9 C)] 98.5 F (36.9 C) (01/26 0400) Pulse Rate:  [56-100] 56 (01/26 0700) Resp:  [11-24] 11 (01/26 0700) BP: (91-127)/(56-82) 113/66 (01/26 0700) SpO2:  [94 %-98 %] 96 % (01/26 0700) Arterial Line BP: (166)/(75) 166/75 (01/25 0900)  Intake/Output from previous day: 01/25 0701 - 01/26 0700 In: 382.1 [I.V.:32.3; IV Piggyback:349.8] Out: 1150 [Urine:1125; Drains:25] Intake/Output this shift: No intake/output data recorded.  Physical Exam: Patient continues to be drowsy but is able to maintain alertness while conversing. She is O X 4. She is in NAD and VSS. Doing well. Speech is fluent and appropriate.  She is able to follow all commands easily.  MAEW. LUE and LLE 3/5. Sensation to light touch is intact. PERLA, EOMI. CNs grossly intact. Dressing is clean dry intact. Incision is well approximated with no drainage, erythema, or edema. Hemovac with approximately 25 ml of serous drainage.    Lab Results: Recent Labs    09/16/21 0551 09/17/21 0536  WBC 16.0* 12.8*  HGB 12.5 13.1  HCT 38.0 38.9  PLT 240 239   BMET Recent Labs    09/16/21 0551 09/17/21 0536  NA 134* 135  K 4.0 4.1  CL 103 102  CO2 21* 26  GLUCOSE 143* 151*  BUN 17 21*  CREATININE 0.61 0.62  CALCIUM 8.5* 8.8*    Studies/Results: CT HEAD WO CONTRAST (5MM)  Result Date: 09/15/2021 CLINICAL DATA:  Postop brain biopsy today. Mental status change. Brain mass. EXAM: CT HEAD WITHOUT CONTRAST TECHNIQUE: Contiguous axial images were obtained from the base of the skull through the vertex without intravenous contrast. RADIATION DOSE REDUCTION: This exam was performed according to the departmental dose-optimization program which includes automated exposure control, adjustment of the mA and/or kV according to patient size and/or use of iterative  reconstruction technique. COMPARISON:  CT head 09/08/2021.  MRI head 09/08/2021. FINDINGS: Brain: Postop right temporal craniotomy for tumor resection. There appears to have been debulking of the tumor which is difficult to see on unenhanced CT. Previously the tumor was hyperdense and centered in the medial temporal lobe with surrounding edema. There is improvement in mass-effect and midline shift which is now mild to the left. Interval development of subdural pneumocephalus anteriorly bilaterally. Interval development of right temporal subdural hematoma measuring approximately 11 mm in thickness. Small amount of subarachnoid hemorrhage in the sylvian fissures. Small amount of gas in the suprasellar cistern and sylvian fissures. Ventricle size remains normal. No acute infarct. Vascular: Negative for hyperdense vessel Skull: Right temporal craniotomy. Craniotomy flap is well position. No subcutaneous fluid collection. Sinuses/Orbits: Mild mucosal edema left maxillary sinus. Remaining sinuses clear. Negative orbit bilaterally. Other: None IMPRESSION: Postop tumor resection in the right temporal lobe. Postop pneumocephalus. Postop subdural hematoma right temporal lobe measuring 11 mm in thickness. Mild amount of subarachnoid hemorrhage. Improved mass-effect and midline shift compared with the preop study. Electronically Signed   By: Franchot Gallo M.D.   On: 09/15/2021 16:14   MR BRAIN WO CONTRAST  Result Date: 09/16/2021 CLINICAL DATA:  Postop tumor resection 09/15/2021. EXAM: MRI HEAD WITHOUT CONTRAST TECHNIQUE: Multiplanar, multiecho pulse sequences of the brain and surrounding structures were obtained without intravenous contrast. COMPARISON:  CT head 09/15/2021.  MRI head 09/09/2021 FINDINGS: Brain: Right temporal craniotomy for tumor debulking in the right temporal lobe. Large tumor is present which has been partially resected.  There is hemorrhage within the posterior aspect of the tumor. Right temporal  subdural fluid collection is similar to the CT yesterday. Decreased mass-effect compared with the preoperative study. Minimal midline shift to the left has improved. Tumor extends into the prepontine cistern on the right with mass-effect on the right pons and midbrain. There is T2 hyperintensity in the midbrain and pons on the right which may be due to edema or tumor spread. This was present preoperatively Within the lateral aspect of the tumor, there is restricted diffusion compatible with acute infarct in the right temporal lobe laterally. Ventricle size remains normal. Vascular: Normal arterial flow voids at the skull base. Skull and upper cervical spine: Right temporal craniotomy. Sinuses/Orbits: Mild mucosal edema paranasal sinuses. Mastoid clear bilaterally. Negative orbit Other: None IMPRESSION: 1. Postop debulking of large right temporal lobe tumor. There is decreased size of tumor and decreased mass-effect and midline shift. 2. Right temporal subdural hematoma as noted on CT yesterday. Mild amount of restricted diffusion in the right lateral temporal lobe compatible with ischemic change. Electronically Signed   By: Franchot Gallo M.D.   On: 09/16/2021 19:05   ECHOCARDIOGRAM COMPLETE  Result Date: 09/16/2021    ECHOCARDIOGRAM REPORT   Patient Name:   Patricia Stevenson Date of Exam: 09/16/2021 Medical Rec #:  [id number]    Height:       62.0 in Accession #:    [id number]   Weight:       125.0 lb Date of Birth:  1961-03-12   BSA:          1.566 m Patient Age:    61 years     BP:           111/60 mmHg Patient Gender: F            HR:           75 bpm. Exam Location:  Inpatient Procedure: 2D Echo, Cardiac Doppler and Color Doppler Indications:    Afib  History:        Patient has no prior history of Echocardiogram examinations.  Sonographer:    Glo Herring Referring Phys: [id number] [place]  1. Left ventricular ejection fraction, by estimation, is 65 to 70%. The left ventricle has  hyperdynamic function. The left ventricle has no regional wall motion abnormalities. There is mild left ventricular hypertrophy. Left ventricular diastolic parameters are indeterminate.  2. Right ventricular systolic function is normal. The right ventricular size is normal. There is normal pulmonary artery systolic pressure. The estimated right ventricular systolic pressure is 70.9 mmHg.  3. The mitral valve is normal in structure. No evidence of mitral valve regurgitation. No evidence of mitral stenosis.  4. The aortic valve is tricuspid. Aortic valve regurgitation is not visualized. No aortic stenosis is present.  5. The inferior vena cava is normal in size with greater than 50% respiratory variability, suggesting right atrial pressure of 3 mmHg. FINDINGS  Left Ventricle: Left ventricular ejection fraction, by estimation, is 65 to 70%. The left ventricle has hyperdynamic function. The left ventricle has no regional wall motion abnormalities. The left ventricular internal cavity size was normal in size. There is mild left ventricular hypertrophy. Left ventricular diastolic parameters are indeterminate. Right Ventricle: The right ventricular size is normal. No increase in right ventricular wall thickness. Right ventricular systolic function is normal. There is normal pulmonary artery systolic pressure. The tricuspid regurgitant velocity is 2.28 m/s, and  with an assumed right atrial pressure of 3  mmHg, the estimated right ventricular systolic pressure is 23.5 mmHg. Left Atrium: Left atrial size was normal in size. Right Atrium: Right atrial size was normal in size. Pericardium: There is no evidence of pericardial effusion. Mitral Valve: The mitral valve is normal in structure. No evidence of mitral valve regurgitation. No evidence of mitral valve stenosis. Tricuspid Valve: The tricuspid valve is normal in structure. Tricuspid valve regurgitation is trivial. Aortic Valve: The aortic valve is tricuspid. Aortic valve  regurgitation is not visualized. No aortic stenosis is present. Aortic valve mean gradient measures 3.0 mmHg. Aortic valve peak gradient measures 5.3 mmHg. Aortic valve area, by VTI measures 1.82 cm. Pulmonic Valve: The pulmonic valve was normal in structure. Pulmonic valve regurgitation is trivial. Aorta: The aortic root is normal in size and structure. Venous: The inferior vena cava is normal in size with greater than 50% respiratory variability, suggesting right atrial pressure of 3 mmHg. IAS/Shunts: No atrial level shunt detected by color flow Doppler.  LEFT VENTRICLE PLAX 2D LVIDd:         3.40 cm LVIDs:         2.10 cm LV PW:         0.90 cm LV IVS:        1.40 cm LVOT diam:     1.60 cm LV SV:         37 LV SV Index:   24 LVOT Area:     2.01 cm  RIGHT VENTRICLE RV Basal diam:  2.90 cm LEFT ATRIUM         Index       RIGHT ATRIUM          Index LA diam:    2.10 cm 1.34 cm/m  RA Area:     9.25 cm                                 RA Volume:   13.90 ml 8.88 ml/m  AORTIC VALVE AV Area (Vmax):    1.73 cm AV Area (Vmean):   1.69 cm AV Area (VTI):     1.82 cm AV Vmax:           114.67 cm/s AV Vmean:          80.867 cm/s AV VTI:            0.203 m AV Peak Grad:      5.3 mmHg AV Mean Grad:      3.0 mmHg LVOT Vmax:         98.83 cm/s LVOT Vmean:        68.067 cm/s LVOT VTI:          0.184 m LVOT/AV VTI ratio: 0.90  AORTA Ao Root diam: 2.50 cm Ao Asc diam:  2.90 cm TRICUSPID VALVE TR Peak grad:   20.8 mmHg TR Vmax:        228.00 cm/s  SHUNTS Systemic VTI:  0.18 m Systemic Diam: 1.60 cm Dalton McleanMD Electronically signed by Franki Monte Signature Date/Time: 09/16/2021/4:56:30 PM    Final     Assessment/Plan: Patient is post-op day 2 s/p Right craniotomy temporal lobectomy. The patient is recovering well. Her neurological exam is slowly improving. She continues to have left-sided weakness. Follow up MRI is stable. Foley catheter was removed yesterday. She has urinary retention and required I/O cath overnight.     -Discontinue Hemovac -Decadron 4mg  Q6H -Keppra 500 mg BID  LOS: 2 days     Marvis Moeller, DNP, NP-C 09/17/2021, 8:04 AM

## 2021-09-17 NOTE — Consult Note (Signed)
Cardiology Consultation:   Patient ID: Patricia Stevenson MRN: [id number]; DOB: 21-Mar-1961  Admit date: 09/15/2021 Date of Consult: 09/17/2021  PCP:  Isaac Bliss, Rayford Halsted, MD   [facility] Providers Cardiologist:  New to Dr. Beckie Busing Click here to update MD or APP on Care Team, Refresh:1}     Patient Profile:   Patricia Stevenson is a 61 y.o. female with a hx of HLD, vitamin D deficiency, GERD who is being seen 09/17/2021 for the evaluation of atrial fibrillation at the request of Hillery Aldo NP.  History of Present Illness:   [name] has no prior cardiac history. She speaks [place] and declined to use a Optometrist today. Her family is also present. She has been dealing with episodic hypoglycemia over the last few months and was pending referral to endocrinology. She followed up with her PCP on 09/07/21 at which time she complained of right sided weakness for several weeks. At that visit, she was noted to have slurred speech and a left facial droop with drooling. Outpatient CT was ordered which demonstrated brain mass so she was referred to ED. 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 She was treated with IV decadron and Keppra with plan to return for surgery. She returned 09/15/21 for a right craniotomy with tumor resection. She underwent brain surgery without complication and was extubated post-operatively. While in PACU she was noted to have mental status change and was not following commands. She went for stat CT head with revealed postop pneumocephalus, subdural hematoma right temporal lobe, and mild amount of subarachnoid hemorrhage - per notes, felt expected post-op findings. Pathology pending. She was noted to have transient atrial fib with RVR and was started on IV diltiazem which was transitioned to oral form. She subsequently converted to NSR overnight around 1am and has remained there  since. She denies any cardiac awareness of atrial fib or prior history of such. No palpitations, chest pain, dyspnea, LEE. K, Mg OK. Leukocytosis noted, downtrending today but otherwise normal renal function and hemoglobin. 2D Echo 09/16/21 EF 65-70%, mild LVH, indeterminate diastolic parameters, normal RV, no significant valvular disease. She was not yet started on anticoagulation and do not see any specific commentary on candidacy for this. She is not on pharmacologic DVT prophylaxis at this time either. She currently has no complaints.   Past Medical History:  Diagnosis Date   GERD (gastroesophageal reflux disease)    Hyperlipidemia    Vitamin D deficiency     Past Surgical History:  Procedure Laterality Date   APPLICATION OF CRANIAL NAVIGATION Right 09/15/2021   Procedure: APPLICATION OF CRANIAL NAVIGATION;  Surgeon: Vallarie Mare, MD;  Location: [place];  Service: Neurosurgery;  Laterality: Right;   BREAST BIOPSY Left    20 years ago in [place]- benign- not sure if bx or cyst   CHOLECYSTECTOMY     Laparoscopic cholecystectomy 04/29/2021   CRANIOTOMY Right 09/15/2021   Procedure: Right Craniotomy Temporal Lobectomy with Brain Lab;  Surgeon: Vallarie Mare, MD;  Location: [facility];  Service: Neurosurgery;  Laterality: Right;     Home Medications:  Prior to Admission medications   Medication Sig Start Date End Date Taking? Authorizing Provider  Ascorbic Acid (VITA-C PO) Take 1 tablet by mouth daily.   Yes [provider]  calcium carbonate (OSCAL) 1500 (600 Ca) MG TABS tablet Take by mouth 2 (two) times daily with a  meal.   Yes [provider]  cholecalciferol (VITAMIN D3) 25 MCG (1000 UNIT) tablet Take 1,000 Units by mouth daily.   Yes [provider]  dexamethasone (DECADRON) 4 MG tablet Take 1 tablet (4 mg total) by mouth 3 (three) times daily. 09/10/21 09/10/22 Yes Marvis Moeller, NP  dicyclomine (BENTYL) 10 MG capsule Take 1 capsule (10 mg total) by  mouth in the morning and at bedtime. As needed 09/03/21  Yes Nandigam, Venia Minks, MD  famotidine (PEPCID) 20 MG tablet Take 1 tablet (20 mg total) by mouth 2 (two) times daily. 09/03/21  Yes Nandigam, Venia Minks, MD  levETIRAcetam (KEPPRA) 500 MG tablet Take 1 tablet (500 mg total) by mouth 2 (two) times daily. 09/10/21  Yes Marvis Moeller, NP  acetaminophen (TYLENOL) 325 MG tablet Take 650 mg by mouth every 6 (six) hours as needed for moderate pain.    [provider]    Inpatient Medications: Scheduled Meds:  calcium carbonate  1,250 mg Oral BID WC   chlorhexidine  15 mL Mouth Rinse BID   Chlorhexidine Gluconate Cloth  6 each Topical Daily   cholecalciferol  1,000 Units Oral Daily   dexamethasone (DECADRON) injection  4 mg Intravenous Q6H   diltiazem  30 mg Oral Q6H   docusate sodium  100 mg Oral BID   mouth rinse  15 mL Mouth Rinse q12n4p   pantoprazole  40 mg Oral QHS   Continuous Infusions:   ceFAZolin (ANCEF) IV 1 g (09/17/21 1310)   levETIRAcetam Stopped (09/17/21 0927)   PRN Meds: acetaminophen **OR** acetaminophen, HYDROcodone-acetaminophen, labetalol, morphine injection, naLOXone (NARCAN)  injection, ondansetron **OR** ondansetron (ZOFRAN) IV, polyethylene glycol, promethazine, sodium phosphate  Allergies:    Allergies  Allergen Reactions   Latex     Social History:   Social History   Socioeconomic History   Marital status: Married    Spouse name: Not on file   Number of children: Not on file   Years of education: Not on file   Highest education level: Not on file  Occupational History   Not on file  Tobacco Use   Smoking status: Never   Smokeless tobacco: Never  Vaping Use   Vaping Use: Not on file  Substance and Sexual Activity   Alcohol use: No   Drug use: No   Sexual activity: Not on file  Other Topics Concern   Not on file  Social History Narrative   Not on file   Social Determinants of Health   Financial Resource Strain: Not on file   Food Insecurity: Not on file  Transportation Needs: Not on file  Physical Activity: Not on file  Stress: Not on file  Social Connections: Not on file  Intimate Partner Violence: Not on file    Family History:    Family History  Problem Relation Age of Onset   Breast cancer Neg Hx    CAD Neg Hx      ROS:  Please see the history of present illness.   All other ROS reviewed and negative.     Physical Exam/Data:   Vitals:   09/17/21 0800 09/17/21 0900 09/17/21 1000 09/17/21 1150  BP: 123/74 114/63 130/73   Pulse: 60 (!) 59 77   Resp: 17 14 17    Temp: 97.6 F (36.4 C)   98.2 F (36.8 C)  TempSrc: Oral   Oral  SpO2: 96% 96% 95%   Weight:      Height:  Intake/Output Summary (Last 24 hours) at 09/17/2021 1316 Last data filed at 09/17/2021 1000 Gross per 24 hour  Intake 431.09 ml  Output 1150 ml  Net -718.91 ml   Last 3 Weights 09/15/2021 09/15/2021 09/08/2021  Weight (lbs) 125 lb 125 lb 121 lb 7.6 oz  Weight (kg) 56.7 kg 56.7 kg 55.1 kg     Body mass index is 22.86 kg/m.  General: Well developed, well nourished Hispanic F, in no acute distress. Head: s/p brain surgery with dressing to right hemicranium with right eye closure/swelling, no xanthomas, nares are without discharge. Neck: Negative for carotid bruits. JVP not elevated. Lungs: Clear bilaterally to auscultation without wheezes, rales, or rhonchi. Breathing is unlabored. Heart: RRR S1 S2 without murmurs, rubs, or gallops.  Abdomen: Soft, non-tender, non-distended with normoactive bowel sounds. No rebound/guarding. Extremities: No clubbing or cyanosis. No edema. Distal pedal pulses are 2+ and equal bilaterally. Neuro: Alert and oriented X 3. Follows commands. Psych:  Responds to questions appropriately with a normal affect. In good spirits.   EKG:  The EKG was personally reviewed and demonstrates:   09/15/21 first tracing SB 59bpm, no acute STT changes, normal 09/15/21 atrial fib 121bpm nonspecific STTW  changes  Telemetry:  Telemetry was personally reviewed and demonstrates:  outlined above  Relevant CV Studies: 2D Echo 09/16/21  1. Left ventricular ejection fraction, by estimation, is 65 to 70%. The  left ventricle has hyperdynamic function. The left ventricle has no  regional wall motion abnormalities. There is mild left ventricular  hypertrophy. Left ventricular diastolic  parameters are indeterminate.   2. Right ventricular systolic function is normal. The right ventricular  size is normal. There is normal pulmonary artery systolic pressure. The  estimated right ventricular systolic pressure is 01.7 mmHg.   3. The mitral valve is normal in structure. No evidence of mitral valve  regurgitation. No evidence of mitral stenosis.   4. The aortic valve is tricuspid. Aortic valve regurgitation is not  visualized. No aortic stenosis is present.   5. The inferior vena cava is normal in size with greater than 50%  respiratory variability, suggesting right atrial pressure of 3 mmHg.   Laboratory Data:  High Sensitivity Troponin:  No results for input(s): TROPONINIHS in the last 720 hours.   Chemistry Recent Labs  Lab 09/15/21 1817 09/16/21 0551 09/17/21 0536  NA 138 134* 135  K 3.7 4.0 4.1  CL  --  103 102  CO2  --  21* 26  GLUCOSE  --  143* 151*  BUN  --  17 21*  CREATININE  --  0.61 0.62  CALCIUM  --  8.5* 8.8*  MG  --  2.6* 2.3  GFRNONAA  --  >60 >60  ANIONGAP  --  10 7    No results for input(s): PROT, ALBUMIN, AST, ALT, ALKPHOS, BILITOT in the last 168 hours. Lipids No results for input(s): CHOL, TRIG, HDL, LABVLDL, LDLCALC, CHOLHDL in the last 168 hours.  Hematology Recent Labs  Lab 09/15/21 1817 09/16/21 0551 09/17/21 0536  WBC  --  16.0* 12.8*  RBC  --  4.22 4.40  HGB 12.9 12.5 13.1  HCT 38.0 38.0 38.9  MCV  --  90.0 88.4  MCH  --  29.6 29.8  MCHC  --  32.9 33.7  RDW  --  13.2 13.2  PLT  --  240 239   Thyroid No results for input(s): TSH, FREET4 in the  last 168 hours.  BNPNo results for input(s): BNP,  PROBNP in the last 168 hours.  DDimer No results for input(s): DDIMER in the last 168 hours.   Radiology/Studies:  CT HEAD WO CONTRAST (5MM)  Result Date: 09/15/2021 CLINICAL DATA:  Postop brain biopsy today. Mental status change. Brain mass. EXAM: CT HEAD WITHOUT CONTRAST TECHNIQUE: Contiguous axial images were obtained from the base of the skull through the vertex without intravenous contrast. RADIATION DOSE REDUCTION: This exam was performed according to the departmental dose-optimization program which includes automated exposure control, adjustment of the mA and/or kV according to patient size and/or use of iterative reconstruction technique. COMPARISON:  CT head 09/08/2021.  MRI head 09/08/2021. FINDINGS: Brain: Postop right temporal craniotomy for tumor resection. There appears to have been debulking of the tumor which is difficult to see on unenhanced CT. Previously the tumor was hyperdense and centered in the medial temporal lobe with surrounding edema. There is improvement in mass-effect and midline shift which is now mild to the left. Interval development of subdural pneumocephalus anteriorly bilaterally. Interval development of right temporal subdural hematoma measuring approximately 11 mm in thickness. Small amount of subarachnoid hemorrhage in the sylvian fissures. Small amount of gas in the suprasellar cistern and sylvian fissures. Ventricle size remains normal. No acute infarct. Vascular: Negative for hyperdense vessel Skull: Right temporal craniotomy. Craniotomy flap is well position. No subcutaneous fluid collection. Sinuses/Orbits: Mild mucosal edema left maxillary sinus. Remaining sinuses clear. Negative orbit bilaterally. Other: None IMPRESSION: Postop tumor resection in the right temporal lobe. Postop pneumocephalus. Postop subdural hematoma right temporal lobe measuring 11 mm in thickness. Mild amount of subarachnoid hemorrhage. Improved  mass-effect and midline shift compared with the preop study. Electronically Signed   By: Franchot Gallo M.D.   On: 09/15/2021 16:14   MR BRAIN WO CONTRAST  Result Date: 09/16/2021 CLINICAL DATA:  Postop tumor resection 09/15/2021. EXAM: MRI HEAD WITHOUT CONTRAST TECHNIQUE: Multiplanar, multiecho pulse sequences of the brain and surrounding structures were obtained without intravenous contrast. COMPARISON:  CT head 09/15/2021.  MRI head 09/09/2021 FINDINGS: Brain: Right temporal craniotomy for tumor debulking in the right temporal lobe. Large tumor is present which has been partially resected. There is hemorrhage within the posterior aspect of the tumor. Right temporal subdural fluid collection is similar to the CT yesterday. Decreased mass-effect compared with the preoperative study. Minimal midline shift to the left has improved. Tumor extends into the prepontine cistern on the right with mass-effect on the right pons and midbrain. There is T2 hyperintensity in the midbrain and pons on the right which may be due to edema or tumor spread. This was present preoperatively Within the lateral aspect of the tumor, there is restricted diffusion compatible with acute infarct in the right temporal lobe laterally. Ventricle size remains normal. Vascular: Normal arterial flow voids at the skull base. Skull and upper cervical spine: Right temporal craniotomy. Sinuses/Orbits: Mild mucosal edema paranasal sinuses. Mastoid clear bilaterally. Negative orbit Other: None IMPRESSION: 1. Postop debulking of large right temporal lobe tumor. There is decreased size of tumor and decreased mass-effect and midline shift. 2. Right temporal subdural hematoma as noted on CT yesterday. Mild amount of restricted diffusion in the right lateral temporal lobe compatible with ischemic change. Electronically Signed   By: Franchot Gallo M.D.   On: 09/16/2021 19:05   ECHOCARDIOGRAM COMPLETE  Result Date: 09/16/2021    ECHOCARDIOGRAM REPORT    Patient Name:   MARESA MORASH Date of Exam: 09/16/2021 Medical Rec #:  [id number]    Height:  62.0 in Accession #:    [id number]   Weight:       125.0 lb Date of Birth:  1961/01/05   BSA:          1.566 m Patient Age:    85 years     BP:           111/60 mmHg Patient Gender: F            HR:           75 bpm. Exam Location:  Inpatient Procedure: 2D Echo, Cardiac Doppler and Color Doppler Indications:    Afib  History:        Patient has no prior history of Echocardiogram examinations.  Sonographer:    Glo Herring Referring Phys: [id number] [place]  1. Left ventricular ejection fraction, by estimation, is 65 to 70%. The left ventricle has hyperdynamic function. The left ventricle has no regional wall motion abnormalities. There is mild left ventricular hypertrophy. Left ventricular diastolic parameters are indeterminate.  2. Right ventricular systolic function is normal. The right ventricular size is normal. There is normal pulmonary artery systolic pressure. The estimated right ventricular systolic pressure is 73.4 mmHg.  3. The mitral valve is normal in structure. No evidence of mitral valve regurgitation. No evidence of mitral stenosis.  4. The aortic valve is tricuspid. Aortic valve regurgitation is not visualized. No aortic stenosis is present.  5. The inferior vena cava is normal in size with greater than 50% respiratory variability, suggesting right atrial pressure of 3 mmHg. FINDINGS  Left Ventricle: Left ventricular ejection fraction, by estimation, is 65 to 70%. The left ventricle has hyperdynamic function. The left ventricle has no regional wall motion abnormalities. The left ventricular internal cavity size was normal in size. There is mild left ventricular hypertrophy. Left ventricular diastolic parameters are indeterminate. Right Ventricle: The right ventricular size is normal. No increase in right ventricular wall thickness. Right ventricular systolic function is normal. There  is normal pulmonary artery systolic pressure. The tricuspid regurgitant velocity is 2.28 m/s, and  with an assumed right atrial pressure of 3 mmHg, the estimated right ventricular systolic pressure is 19.3 mmHg. Left Atrium: Left atrial size was normal in size. Right Atrium: Right atrial size was normal in size. Pericardium: There is no evidence of pericardial effusion. Mitral Valve: The mitral valve is normal in structure. No evidence of mitral valve regurgitation. No evidence of mitral valve stenosis. Tricuspid Valve: The tricuspid valve is normal in structure. Tricuspid valve regurgitation is trivial. Aortic Valve: The aortic valve is tricuspid. Aortic valve regurgitation is not visualized. No aortic stenosis is present. Aortic valve mean gradient measures 3.0 mmHg. Aortic valve peak gradient measures 5.3 mmHg. Aortic valve area, by VTI measures 1.82 cm. Pulmonic Valve: The pulmonic valve was normal in structure. Pulmonic valve regurgitation is trivial. Aorta: The aortic root is normal in size and structure. Venous: The inferior vena cava is normal in size with greater than 50% respiratory variability, suggesting right atrial pressure of 3 mmHg. IAS/Shunts: No atrial level shunt detected by color flow Doppler.  LEFT VENTRICLE PLAX 2D LVIDd:         3.40 cm LVIDs:         2.10 cm LV PW:         0.90 cm LV IVS:        1.40 cm LVOT diam:     1.60 cm LV SV:         37  LV SV Index:   24 LVOT Area:     2.01 cm  RIGHT VENTRICLE RV Basal diam:  2.90 cm LEFT ATRIUM         Index       RIGHT ATRIUM          Index LA diam:    2.10 cm 1.34 cm/m  RA Area:     9.25 cm                                 RA Volume:   13.90 ml 8.88 ml/m  AORTIC VALVE AV Area (Vmax):    1.73 cm AV Area (Vmean):   1.69 cm AV Area (VTI):     1.82 cm AV Vmax:           114.67 cm/s AV Vmean:          80.867 cm/s AV VTI:            0.203 m AV Peak Grad:      5.3 mmHg AV Mean Grad:      3.0 mmHg LVOT Vmax:         98.83 cm/s LVOT Vmean:         68.067 cm/s LVOT VTI:          0.184 m LVOT/AV VTI ratio: 0.90  AORTA Ao Root diam: 2.50 cm Ao Asc diam:  2.90 cm TRICUSPID VALVE TR Peak grad:   20.8 mmHg TR Vmax:        228.00 cm/s  SHUNTS Systemic VTI:  0.18 m Systemic Diam: 1.60 cm Dalton McleanMD Electronically signed by Franki Monte Signature Date/Time: 09/16/2021/4:56:30 PM    Final      Assessment and Plan:   1. Brain tumor s/p right craniotomy temporal lobectomy - Hemovac discontinued today - remains on Decadron, Keppra - pathology pending  2. Newly recognized atrial fibrillation with RVR - duration 1/24-1/26/23, converted around 1am spontaneously on diltiazem - CHADSVASC is 1 for female - HTN was listed in [facility] but patient denies this. Suspect borderline in the past. There have been mildly elevated sporadic blood pressures recorded in the past but largely normotensive recently with no meds. A1C was 5.5 in 04/2021 per PCP visit notes. 2D echo with normal LV function. No hx of CAD or vascular disease. Given recent brain surgery with post-op SAH/SDH (reported expected per NS note), we also do not yet have clearance for consideration of anticoagulation in this patient. Recommend neurosurgery comment on rounds in AM for candidacy to begin if needed in the future. - remains in NSR at this time -> would continue short-acting diltiazem 30mg  q6hr while in ICU but anticipate transition to long acting form when felt to be more clinically stable post-operatively  Per pt/nurse request, spoke with daughter [name] via telephone to update her on cardiac plan.   Risk Assessment/Risk Scores:       CHA2DS2-VASc Score = 1   This indicates a 0.6% annual risk of stroke. The patient's score is based upon: CHF History: 0 HTN History: 0 Diabetes History: 0 Stroke History: 0 Vascular Disease History: 0 Age Score: 0 Gender Score: 1       For questions or updates, please contact [place] Please consult [email or web address] for contact info under     Signed, Charlie Pitter, PA-C  09/17/2021 1:16 PM

## 2021-09-17 NOTE — Progress Notes (Signed)
Foley cath removed yesterday. Patient required intermittent cath X2 since removal.

## 2021-09-17 NOTE — Progress Notes (Cosign Needed)
NAME:  Patricia Stevenson, MRN:  [id number], DOB:  Oct 20, 1960, LOS: 2 ADMISSION DATE:  09/15/2021, CONSULTATION DATE:  09/15/2021 REFERRING MD:  Merrily Pew, NP, CHIEF COMPLAINT:  AMS   History of Present Illness:   61 year old Hispanic speaking female with prior history of HTN and HLD.  She was recently admitted 1/17 to 1/19 after having intermittent left sided weakness and left facial droop, near syncopal episodes at home, and family reports weight loss over the last year; no hx of seizures.  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  She returned 1/24 for a right craniotomy with tumor resection by Dr. Marcello Moores.  Underwent surgery without complication and was extubated postoperatively.  While in PACU, she was noted to have mental status change and was not following commands.  Went for stat CT head which revealed expected postop findings.  She was then transferred to ICU and PCCM consulted.   Pertinent  Medical History  R temporal brain mass, ? HTN but not on home meds   Significant Hospital Events: Including procedures, antibiotic start and stop dates in addition to other pertinent events   1/24 right craniotomy with right temporal lobe resection   Interim History / Subjective:  Tmax 98.5 1.1 L uop, 25 HV, -1.4L admit  Off IV cardiac meds  Subjective: denies chest pain, SOB,  endorses head ache  Objective   Blood pressure 123/74, pulse 60, temperature 97.6 F (36.4 C), temperature source Oral, resp. rate 17, height 5\' 2"  (1.575 m), weight 56.7 kg, SpO2 96 %.        Intake/Output Summary (Last 24 hours) at 09/17/2021 0840 Last data filed at 09/17/2021 0700 Gross per 24 hour  Intake 382.09 ml  Output 1150 ml  Net -767.91 ml   Filed Weights   09/15/21 0545 09/15/21 0602  Weight: 56.7 kg 56.7 kg   Examination: General: In bed, NAD, appears comfortable HEENT: MM pink/moist, anicteric, atraumatic Neuro: RASS 0, PERRL  78mm, GCS 15 CV: S1S2, SB, no m/r/g appreciated PULM:  clear in the upper lobes, clear in the lower lobes, trachea midline, chest expansion symmetric GI: soft, bsx4 active, non-tender   Extremities: warm/dry, no pretibial edema, capillary refill less than 3 seconds  Skin:  no rashes or lesions noted, surgical sites C/D/I   WBC 16.0>12.8 BUN 21  MRI Postop debulking on large right temporal tumor. Decreased mass effect and midline shift. Right temporal SDH.  Resolved Hospital Problem list   Hyponatremia  Assessment & Plan:   Right temporal lobe mass s/p R craniotomy and tumor resection  - surgery uncomplicated, mental status change in PACU, postop CTH with expected postop changes. MRI postop stable Postop encephalopathy- resolved P:  -Management per neurosurgery -BP goals per neuro. PRN labetalol ordered -Decadron and keppra per NSGY -Follow surgical pathology -Continue multimodal pain control:Acetaminophen, vicodin, and morphine with bowel regimen -ABX per NSGY -HV to be d/c's by nsgy today  New onset Afib with RVR Suspect secondary to surgical stress, however likely at risk for reoccurrence. 1/27 ECHO with LVEF of 65-70%. RVSF WNL. -Continue PO Cardizem 30MG  TID today -Goal K above 4, goal MG above 2 -Continue on tele -PRN EKG - her CHA2DS2-VASc score is 1 (for age) with adjusted stroke rate of 1.3% per year.   -Ac per NSGY -Will need cardiology follow up. Placing cards consult.  Leukocytosis WBC 16>12.8, expected post op, on steroids -monitor  Stable for transfer out of ICU from [facility] standpoint.    Best Practice (right click and "Reselect all SmartList Selections" daily)   Diet/type: Regular consistency (see orders)- advance as tolerated DVT prophylaxis: SCD GI prophylaxis: PPI Lines: N/A Foley:  N/A Code Status:  full code Last date of multidisciplinary goals of care discussion [per primary ]   Critical care time: NA mins     Redmond School.,  MSN, APRN, AGACNP-BC [facility]  09/17/2021 , 8:40 AM  Please see [email or web address] for pager details  If no response, please call [phone] After hours, please call [place] at [phone]

## 2021-09-17 NOTE — Progress Notes (Addendum)
Neurosurgery  Pt seen and examined.  EOMI, A+Ox3, left pronator drift.  Dressing c/d  - MRI reviewed-- there is a fair amount of swelling of her residual tumor but fortunately with the temporal lobectomy, there is more than enough room to accommodate this - decrease dex to 4 q 8 - likely will continue Keppra indefinitely given location of her tumor - f/u path - likely downgrade tomorrow, possible home on weekend

## 2021-09-18 ENCOUNTER — Encounter: Payer: Self-pay | Admitting: Endocrinology

## 2021-09-18 LAB — CBC
HCT: 38.5 % (ref 36.0–46.0)
Hemoglobin: 12.8 g/dL (ref 12.0–15.0)
MCH: 29.8 pg (ref 26.0–34.0)
MCHC: 33.2 g/dL (ref 30.0–36.0)
MCV: 89.5 fL (ref 80.0–100.0)
Platelets: 244 10*3/uL (ref 150–400)
RBC: 4.3 MIL/uL (ref 3.87–5.11)
RDW: 13.2 % (ref 11.5–15.5)
WBC: 11.3 10*3/uL — ABNORMAL HIGH (ref 4.0–10.5)
nRBC: 0 % (ref 0.0–0.2)

## 2021-09-18 LAB — BASIC METABOLIC PANEL
Anion gap: 7 (ref 5–15)
BUN: 22 mg/dL — ABNORMAL HIGH (ref 6–20)
CO2: 28 mmol/L (ref 22–32)
Calcium: 8.9 mg/dL (ref 8.9–10.3)
Chloride: 101 mmol/L (ref 98–111)
Creatinine, Ser: 0.64 mg/dL (ref 0.44–1.00)
GFR, Estimated: >60 mL/min (ref >60)
Glucose, Bld: 144 mg/dL — ABNORMAL HIGH (ref 70–99)
Potassium: 4.3 mmol/L (ref 3.5–5.1)
Sodium: 136 mmol/L (ref 135–145)

## 2021-09-18 LAB — MAGNESIUM: Magnesium: 2.4 mg/dL (ref 1.7–2.4)

## 2021-09-18 MED ORDER — DILTIAZEM HCL ER COATED BEADS 120 MG PO CP24
120.0000 mg | ORAL_CAPSULE | Freq: Every day | ORAL | Status: DC
  Administered 2021-09-18 – 2021-09-19 (×2): 120 mg via ORAL
  Filled 2021-09-18 (×2): qty 1

## 2021-09-18 NOTE — Evaluation (Signed)
Physical Therapy Evaluation Patient Details Name: Patricia Stevenson MRN: [id number] DOB: 1960/09/11 Today's Date: 09/18/2021  History of Present Illness  61 yo female presents to ED on 1/17 with complaints of 81-month bilat UE weakness L>R, facial weakness/numbness. 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 d/c 1/19, readmitted 1/24 s/p R craniotomy, R temporal lobectomy. AMS event immediately post-op, head CT showing expected post-op findings, narcan administered with improved mentation. PMH Includes HTN, HLD.  Clinical Impression   Pt presents with generalized weakness, impaired activity tolerance, poor standing balance. Pt to benefit from acute PT to address deficits. Pt tolerated sit<>stand and taking x2 steps forward/back at EOB, could not progress beyond this due to pt fatigue and decreasing command following, improved with return to supine and VSS. PT anticipates pt will progress well with mobility while acute, but at this point recommending AIR to return to PLOF. Pt is hopeful to get to d/c home with support of family. PT to progress mobility as tolerated, and will continue to follow acutely.         Recommendations for follow up therapy are one component of a multi-disciplinary discharge planning process, led by the attending physician.  Recommendations may be updated based on patient status, additional functional criteria and insurance authorization.  Follow Up Recommendations Acute inpatient rehab (3hours/day)    Assistance Recommended at Discharge Frequent or constant Supervision/Assistance  Patient can return home with the following  A little help with walking and/or transfers;A little help with bathing/dressing/bathroom;Help with stairs or ramp for entrance;Assist for transportation;Assistance with cooking/housework    Equipment Recommendations Rolling walker (2 wheels)   Recommendations for Other Services       Functional Status Assessment Patient has had a recent decline in their functional status and demonstrates the ability to make significant improvements in function in a reasonable and predictable amount of time.     Precautions / Restrictions Precautions Precautions: Fall Restrictions Weight Bearing Restrictions: No      Mobility  Bed Mobility Overal bed mobility: Needs Assistance Bed Mobility: Supine to Sit, Sit to Supine     Supine to sit: Supervision Sit to supine: Min assist, HOB elevated   General bed mobility comments: supervision for safety for moving to EOB, increased time and max cuing to stay on task. Min assist for return to supine for LE lifting into bed, boost up in bed with pt participating through pushing through LEs.    Transfers Overall transfer level: Needs assistance Equipment used: Rolling walker (2 wheels) Transfers: Sit to/from Stand Sit to Stand: Min assist           General transfer comment: light assist to steady, slow to rise. x2 forward and backward steps only, limited by pt fatigue and feeling "weak"    Ambulation/Gait                  Stairs            Wheelchair Mobility    Modified Rankin (Stroke Patients Only)       Balance                                             Pertinent Vitals/Pain Pain Assessment Pain Assessment: No/denies pain    Home Living Family/patient expects to be discharged to:: Private residence  Living Arrangements: Spouse/significant other;Other relatives (sister) Available Help at Discharge: Family Type of Home: Apartment Home Access: Stairs to enter Entrance Stairs-Rails: Left Entrance Stairs-Number of Steps: 15   Home Layout: One level Home Equipment: None      Prior Function Prior Level of Function : Independent/Modified Independent                     Hand Dominance   Dominant Hand: Right     Extremity/Trunk Assessment   Upper Extremity Assessment Upper Extremity Assessment: Defer to OT evaluation    Lower Extremity Assessment Lower Extremity Assessment: Generalized weakness    Cervical / Trunk Assessment Cervical / Trunk Assessment: Normal  Communication   Communication: No difficulties;Other (comment) (chart lists spanish speaking, needs interpreter - pt speaks fluent english)  Cognition Arousal/Alertness: Awake/alert (drowsy) Behavior During Therapy: Flat affect Overall Cognitive Status: Impaired/Different from baseline Area of Impairment: Attention, Following commands, Safety/judgement, Problem solving                   Current Attention Level: Sustained   Following Commands: Follows one step commands with increased time Safety/Judgement: Decreased awareness of safety, Decreased awareness of deficits   Problem Solving: Slow processing, Decreased initiation, Difficulty sequencing, Requires verbal cues, Requires tactile cues General Comments: pt with increased processing time to follow cues, frequent eye closing with max cuing for eye opening. Per family, suspect due to medication        General Comments      Exercises     Assessment/Plan    PT Assessment Patient needs continued PT services  PT Problem List Decreased strength;Decreased mobility;Decreased safety awareness;Decreased balance;Decreased knowledge of use of DME;Decreased coordination;Decreased knowledge of precautions;Decreased activity tolerance       PT Treatment Interventions DME instruction;Therapeutic activities;Gait training;Patient/family education;Balance training;Stair training;Functional mobility training;Therapeutic exercise;Neuromuscular re-education    PT Goals (Current goals can be found in the Care Plan section)  Acute Rehab PT Goals Patient Stated Goal: home PT Goal Formulation: With patient Time For Goal Achievement: 10/02/21 Potential to Achieve Goals: Good     Frequency Min 4X/week     Co-evaluation               AM-PAC PT "6 Clicks" Mobility  Outcome Measure Help needed turning from your back to your side while in a flat bed without using bedrails?: A Little Help needed moving from lying on your back to sitting on the side of a flat bed without using bedrails?: A Little Help needed moving to and from a bed to a chair (including a wheelchair)?: A Little Help needed standing up from a chair using your arms (e.g., wheelchair or bedside chair)?: A Little Help needed to walk in hospital room?: A Little Help needed climbing 3-5 steps with a railing? : A Lot 6 Click Score: 17    End of Session   Activity Tolerance: Patient tolerated treatment well;Patient limited by fatigue Patient left: in bed;with call bell/phone within reach;with bed alarm set;with family/visitor present;with SCD's reapplied Nurse Communication: Mobility status PT Visit Diagnosis: Other abnormalities of gait and mobility (R26.89);Muscle weakness (generalized) (M62.81)    Time: 6301-6010 PT Time Calculation (min) (ACUTE ONLY): 21 min   Charges:   PT Evaluation $PT Eval Low Complexity: 1 Low         Agnes Brightbill S, PT DPT Acute Rehabilitation Services Pager [phone]  Office [phone]   Roxine Caddy E Ruffin Pyo 09/18/2021, 2:32 PM

## 2021-09-18 NOTE — Progress Notes (Signed)
Inpatient Rehab Admissions Coordinator Note:   Per therapy recommendations patient was screened for CIR candidacy by Michel Santee, PT.  Note pt wishing to discharge home and currently min a/min guard with mobility.  Will follow for 1 more therapy session.  If she fails to progress we will assess for CIR.     Shann Medal, PT, DPT [phone] 09/18/21 3:49 PM

## 2021-09-18 NOTE — Progress Notes (Signed)
Progress Note  Patient Name: Patricia Stevenson Date of Encounter: 09/18/2021  [facility] Cardiologist:Tonee Silverstein, Stanton Kidney MD, MS  Subjective   She is fatigued. No distress  Inpatient Medications    Scheduled Meds:  calcium carbonate  1,250 mg Oral BID WC   chlorhexidine  15 mL Mouth Rinse BID   Chlorhexidine Gluconate Cloth  6 each Topical Daily   cholecalciferol  1,000 Units Oral Daily   dexamethasone  4 mg Oral Q8H   diltiazem  120 mg Oral Daily   docusate sodium  100 mg Oral BID   levETIRAcetam  500 mg Oral BID   mouth rinse  15 mL Mouth Rinse q12n4p   pantoprazole  40 mg Oral QHS   polyethylene glycol  17 g Oral BID   Continuous Infusions:  PRN Meds: acetaminophen **OR** acetaminophen, HYDROcodone-acetaminophen, labetalol, morphine injection, naLOXone (NARCAN)  injection, ondansetron **OR** ondansetron (ZOFRAN) IV, promethazine, sodium phosphate   Vital Signs    Vitals:   09/18/21 0600 09/18/21 0700 09/18/21 0800 09/18/21 0900  BP: 98/62 120/70 (!) 99/53 (!) 109/52  Pulse: (!) 55 (!) 52 (!) 57 60  Resp: 16 12 12 14   Temp:   97.8 F (36.6 C)   TempSrc:   Oral   SpO2: 94% 93% 95% 92%  Weight:      Height:        Intake/Output Summary (Last 24 hours) at 09/18/2021 0940 Last data filed at 09/18/2021 0645 Gross per 24 hour  Intake 650 ml  Output 1150 ml  Net -500 ml   Last 3 Weights 09/15/2021 09/15/2021 09/08/2021  Weight (lbs) 125 lb 125 lb 121 lb 7.6 oz  Weight (kg) 56.7 kg 56.7 kg 55.1 kg      Telemetry    NSR - Personally Reviewed  ECG    No new - Personally Reviewed  Physical Exam   Vitals:   09/18/21 0800 09/18/21 0900  BP: (!) 99/53 (!) 109/52  Pulse: (!) 57 60  Resp: 12 14  Temp: 97.8 F (36.6 C)   SpO2: 95% 92%    GEN: No acute distress.   Neck: No JVD HEENT: right post craniotomy bandage Cardiac: RRR, no murmurs, rubs, or gallops.  Respiratory: Clear to auscultation bilaterally. GI: Soft, nontender, non-distended  MS: No edema; No  deformity. Neuro:  Nonfocal  Psych: Normal affect   Labs    High Sensitivity Troponin:  No results for input(s): TROPONINIHS in the last 720 hours.   Chemistry Recent Labs  Lab 09/16/21 0551 09/17/21 0536 09/18/21 0339  NA 134* 135 136  K 4.0 4.1 4.3  CL 103 102 101  CO2 21* 26 28  GLUCOSE 143* 151* 144*  BUN 17 21* 22*  CREATININE 0.61 0.62 0.64  CALCIUM 8.5* 8.8* 8.9  MG 2.6* 2.3 2.4  GFRNONAA >60 >60 >60  ANIONGAP 10 7 7     Lipids No results for input(s): CHOL, TRIG, HDL, LABVLDL, LDLCALC, CHOLHDL in the last 168 hours.  Hematology Recent Labs  Lab 09/16/21 0551 09/17/21 0536 09/18/21 0339  WBC 16.0* 12.8* 11.3*  RBC 4.22 4.40 4.30  HGB 12.5 13.1 12.8  HCT 38.0 38.9 38.5  MCV 90.0 88.4 89.5  MCH 29.6 29.8 29.8  MCHC 32.9 33.7 33.2  RDW 13.2 13.2 13.2  PLT 240 239 244   Thyroid  Recent Labs  Lab 09/17/21 1247  TSH 0.224*    BNPNo results for input(s): BNP, PROBNP in the last 168 hours.  DDimer No results for input(s):  DDIMER in the last 168 hours.   Radiology    MR BRAIN WO CONTRAST  Result Date: 09/16/2021 CLINICAL DATA:  Postop tumor resection 09/15/2021. EXAM: MRI HEAD WITHOUT CONTRAST TECHNIQUE: Multiplanar, multiecho pulse sequences of the brain and surrounding structures were obtained without intravenous contrast. COMPARISON:  CT head 09/15/2021.  MRI head 09/09/2021 FINDINGS: Brain: Right temporal craniotomy for tumor debulking in the right temporal lobe. Large tumor is present which has been partially resected. There is hemorrhage within the posterior aspect of the tumor. Right temporal subdural fluid collection is similar to the CT yesterday. Decreased mass-effect compared with the preoperative study. Minimal midline shift to the left has improved. Tumor extends into the prepontine cistern on the right with mass-effect on the right pons and midbrain. There is T2 hyperintensity in the midbrain and pons on the right which may be due to edema or tumor  spread. This was present preoperatively Within the lateral aspect of the tumor, there is restricted diffusion compatible with acute infarct in the right temporal lobe laterally. Ventricle size remains normal. Vascular: Normal arterial flow voids at the skull base. Skull and upper cervical spine: Right temporal craniotomy. Sinuses/Orbits: Mild mucosal edema paranasal sinuses. Mastoid clear bilaterally. Negative orbit Other: None IMPRESSION: 1. Postop debulking of large right temporal lobe tumor. There is decreased size of tumor and decreased mass-effect and midline shift. 2. Right temporal subdural hematoma as noted on CT yesterday. Mild amount of restricted diffusion in the right lateral temporal lobe compatible with ischemic change. Electronically Signed   By: Franchot Gallo M.D.   On: 09/16/2021 19:05   ECHOCARDIOGRAM COMPLETE  Result Date: 09/16/2021    ECHOCARDIOGRAM REPORT   Patient Name:   Patricia Stevenson Date of Exam: 09/16/2021 Medical Rec #:  [id number]    Height:       62.0 in Accession #:    [id number]   Weight:       125.0 lb Date of Birth:  06-11-1961   BSA:          1.566 m Patient Age:    66 years     BP:           111/60 mmHg Patient Gender: F            HR:           75 bpm. Exam Location:  Inpatient Procedure: 2D Echo, Cardiac Doppler and Color Doppler Indications:    Afib  History:        Patient has no prior history of Echocardiogram examinations.  Sonographer:    Glo Herring Referring Phys: [id number] [place]  1. Left ventricular ejection fraction, by estimation, is 65 to 70%. The left ventricle has hyperdynamic function. The left ventricle has no regional wall motion abnormalities. There is mild left ventricular hypertrophy. Left ventricular diastolic parameters are indeterminate.  2. Right ventricular systolic function is normal. The right ventricular size is normal. There is normal pulmonary artery systolic pressure. The estimated right ventricular systolic pressure is  58.8 mmHg.  3. The mitral valve is normal in structure. No evidence of mitral valve regurgitation. No evidence of mitral stenosis.  4. The aortic valve is tricuspid. Aortic valve regurgitation is not visualized. No aortic stenosis is present.  5. The inferior vena cava is normal in size with greater than 50% respiratory variability, suggesting right atrial pressure of 3 mmHg. FINDINGS  Left Ventricle: Left ventricular ejection fraction, by estimation, is 65 to 70%. The left ventricle  has hyperdynamic function. The left ventricle has no regional wall motion abnormalities. The left ventricular internal cavity size was normal in size. There is mild left ventricular hypertrophy. Left ventricular diastolic parameters are indeterminate. Right Ventricle: The right ventricular size is normal. No increase in right ventricular wall thickness. Right ventricular systolic function is normal. There is normal pulmonary artery systolic pressure. The tricuspid regurgitant velocity is 2.28 m/s, and  with an assumed right atrial pressure of 3 mmHg, the estimated right ventricular systolic pressure is 37.8 mmHg. Left Atrium: Left atrial size was normal in size. Right Atrium: Right atrial size was normal in size. Pericardium: There is no evidence of pericardial effusion. Mitral Valve: The mitral valve is normal in structure. No evidence of mitral valve regurgitation. No evidence of mitral valve stenosis. Tricuspid Valve: The tricuspid valve is normal in structure. Tricuspid valve regurgitation is trivial. Aortic Valve: The aortic valve is tricuspid. Aortic valve regurgitation is not visualized. No aortic stenosis is present. Aortic valve mean gradient measures 3.0 mmHg. Aortic valve peak gradient measures 5.3 mmHg. Aortic valve area, by VTI measures 1.82 cm. Pulmonic Valve: The pulmonic valve was normal in structure. Pulmonic valve regurgitation is trivial. Aorta: The aortic root is normal in size and structure. Venous: The inferior  vena cava is normal in size with greater than 50% respiratory variability, suggesting right atrial pressure of 3 mmHg. IAS/Shunts: No atrial level shunt detected by color flow Doppler.  LEFT VENTRICLE PLAX 2D LVIDd:         3.40 cm LVIDs:         2.10 cm LV PW:         0.90 cm LV IVS:        1.40 cm LVOT diam:     1.60 cm LV SV:         37 LV SV Index:   24 LVOT Area:     2.01 cm  RIGHT VENTRICLE RV Basal diam:  2.90 cm LEFT ATRIUM         Index       RIGHT ATRIUM          Index LA diam:    2.10 cm 1.34 cm/m  RA Area:     9.25 cm                                 RA Volume:   13.90 ml 8.88 ml/m  AORTIC VALVE AV Area (Vmax):    1.73 cm AV Area (Vmean):   1.69 cm AV Area (VTI):     1.82 cm AV Vmax:           114.67 cm/s AV Vmean:          80.867 cm/s AV VTI:            0.203 m AV Peak Grad:      5.3 mmHg AV Mean Grad:      3.0 mmHg LVOT Vmax:         98.83 cm/s LVOT Vmean:        68.067 cm/s LVOT VTI:          0.184 m LVOT/AV VTI ratio: 0.90  AORTA Ao Root diam: 2.50 cm Ao Asc diam:  2.90 cm TRICUSPID VALVE TR Peak grad:   20.8 mmHg TR Vmax:        228.00 cm/s  SHUNTS Systemic VTI:  0.18 m Systemic Diam: 1.60 cm Dalton  McleanMD Electronically signed by Franki Monte Signature Date/Time: 09/16/2021/4:56:30 PM    Final     Cardiac Studies     1. Left ventricular ejection fraction, by estimation, is 65 to 70%. The  left ventricle has hyperdynamic function. The left ventricle has no  regional wall motion abnormalities. There is mild left ventricular  hypertrophy. Left ventricular diastolic  parameters are indeterminate.   2. Right ventricular systolic function is normal. The right ventricular  size is normal. There is normal pulmonary artery systolic pressure. The  estimated right ventricular systolic pressure is 03.5 mmHg.   3. The mitral valve is normal in structure. No evidence of mitral valve  regurgitation. No evidence of mitral stenosis.   4. The aortic valve is tricuspid. Aortic valve  regurgitation is not  visualized. No aortic stenosis is present.   5. The inferior vena cava is normal in size with greater than 50%  respiratory variability, suggesting right atrial pressure of 3 mmHg.   Patient Profile     Mrs Willetts has no prior significant pmhx. No history of cardiac disease. She presented with neural focal deficits with left sided weakness and dysarthria for several weeks and found to have large temporal lobe mass c/f  glioma. She is s/p R craniotomy with tumor resection 09/15/2021. She's had post op pneumocephalus, subdural hematoma of the right temporal lobe and mild SAH. She's been managed on IV decadron and keppra. She developed atrial fibrillation noted in the OR. This persisted and she was started on diltiazem. She converted at approximately 1 AM on 09/17/2021. Her LV function is normal with no valve disease  Assessment & Plan      New Onset Afib: She is a chads2vasc =1 due to gender.Her annual risk of stroke 0.6%. Considering her risk of stroke is very low, anticoagulation or antiplatelet therapy is not a recommendation. Therefore, I do not recommend AC or antiplatelet therapy. - she's maintained sinus rhythm - no plan for anticoagulation - consolidating diltiazem to CD 120 mg from 30 q6 - cardiology will sign off; please don't hesitate to reach out if she goes back into afib. We can arrange for follow up with cardiology  For questions or updates, please contact [place] Please consult [email or web address] for contact info under        Signed, Janina Mayo, MD  09/18/2021, 9:40 AM

## 2021-09-18 NOTE — Evaluation (Signed)
Occupational Therapy Evaluation Patient Details Name: Patricia Stevenson MRN: [id number] DOB: 1961-02-04 Today's Date: 09/18/2021   History of Present Illness 61 yo female presents to ED on 1/17 with complaints of 39-month bilat UE weakness L>R, facial weakness/numbness. 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 d/c 1/19, readmitted 1/24 s/p R craniotomy, R temporal lobectomy. AMS event immediately post-op, head CT showing expected post-op findings, narcan administered with improved mentation. PMH Includes HTN, HLD.   Clinical Impression   Patient admitted for the diagnosis and procedure above.  PTA she lives with her spouse, has sister's that live nearby, but needed no assist with ADL/IADL, or mobility.  Deficits are listed below.  Currently the patient is needing up to [place] for basic mobility, and up to [place] for lower body ADL.  OT will follow in the acute setting, and she would benefit from a more aggressive multi disciplined approach to post acute rehab like [facility].     Recommendations for follow up therapy are one component of a multi-disciplinary discharge planning process, led by the attending physician.  Recommendations may be updated based on patient status, additional functional criteria and insurance authorization.   Follow Up Recommendations  Acute inpatient rehab (3hours/day)    Assistance Recommended at Discharge Frequent or constant Supervision/Assistance  Patient can return home with the following Assistance with cooking/housework;Assist for transportation;A lot of help with walking and/or transfers;A lot of help with bathing/dressing/bathroom;Direct supervision/assist for medications management    Functional Status Assessment  Patient has had a recent decline in their functional status and demonstrates the ability to make significant improvements in function in a reasonable and  predictable amount of time.  Equipment Recommendations  BSC/3in1    Recommendations for Other Services       Precautions / Restrictions Precautions Precautions: Fall Restrictions Weight Bearing Restrictions: No      Mobility Bed Mobility Overal bed mobility: Needs Assistance Bed Mobility: Supine to Sit, Sit to Supine     Supine to sit: Supervision Sit to supine: Min guard        Transfers Overall transfer level: Needs assistance Equipment used: Rolling walker (2 wheels) Transfers: Sit to/from Stand Sit to Stand: Min assist                  Balance Overall balance assessment: Needs assistance Sitting-balance support: Feet supported Sitting balance-Leahy Scale: Fair     Standing balance support: Reliant on assistive device for balance Standing balance-Leahy Scale: Poor                             ADL either performed or assessed with clinical judgement   ADL Overall ADL's : Needs assistance/impaired Eating/Feeding: Set up;Bed level   Grooming: Wash/dry hands;Wash/dry face;Set up;Bed level           Upper Body Dressing : Minimal assistance;Sitting   Lower Body Dressing: Moderate assistance;Sitting/lateral leans                       Vision Patient Visual Report: No change from baseline       Perception Perception Perception: Within Functional Limits   Praxis Praxis Praxis: Intact    Pertinent Vitals/Pain Pain Assessment Pain Assessment: No/denies pain     Hand Dominance Right   Extremity/Trunk Assessment Upper Extremity Assessment Upper Extremity Assessment: Overall WFL for tasks assessed  Lower Extremity Assessment Lower Extremity Assessment: Defer to PT evaluation   Cervical / Trunk Assessment Cervical / Trunk Assessment: Normal   Communication Communication Communication: No difficulties;Other (comment)   Cognition Arousal/Alertness: Awake/alert Behavior During Therapy: WFL for tasks  assessed/performed Overall Cognitive Status: Within Functional Limits for tasks assessed                                 General Comments: patient much more alert and conversational with OT.  Sister's endorse no cognitive deficits compared to baseline.                      Home Living Family/patient expects to be discharged to:: Private residence Living Arrangements: Spouse/significant other;Other relatives Available Help at Discharge: Family Type of Home: Apartment Home Access: Stairs to enter Entrance Stairs-Number of Steps: 15 Entrance Stairs-Rails: Left Home Layout: One level     Bathroom Shower/Tub: Teacher, early years/pre: Standard     Home Equipment: None          Prior Functioning/Environment Prior Level of Function : Independent/Modified Independent;Driving                        OT Problem List: Decreased strength;Decreased activity tolerance;Impaired balance (sitting and/or standing);Decreased knowledge of use of DME or AE;Decreased safety awareness;Pain      OT Treatment/Interventions: Self-care/ADL training;Therapeutic exercise;Therapeutic activities;Patient/family education;Balance training;DME and/or AE instruction    OT Goals(Current goals can be found in the care plan section) Acute Rehab OT Goals Patient Stated Goal: To get stronger and move more OT Goal Formulation: With patient Time For Goal Achievement: 10/02/21 Potential to Achieve Goals: Good  OT Frequency: Min 2X/week    Co-evaluation              AM-PAC OT "6 Clicks" Daily Activity     Outcome Measure Help from another person eating meals?: A Little Help from another person taking care of personal grooming?: A Little Help from another person toileting, which includes using toliet, bedpan, or urinal?: A Little Help from another person bathing (including washing, rinsing, drying)?: A Lot Help from another person to put on and taking off regular  upper body clothing?: A Little Help from another person to put on and taking off regular lower body clothing?: A Lot 6 Click Score: 16   End of Session Equipment Utilized During Treatment: Rolling walker (2 wheels) Nurse Communication: Mobility status  Activity Tolerance: Patient tolerated treatment well Patient left: in bed;with call bell/phone within reach;with bed alarm set;with family/visitor present  OT Visit Diagnosis: Unsteadiness on feet (R26.81);Pain                Time: 7494-4967 OT Time Calculation (min): 15 min Charges:  OT General Charges $OT Visit: 1 Visit OT Evaluation $OT Eval Moderate Complexity: 1 Mod  09/18/2021  RP, OTR/L  Acute Rehabilitation Services  Office:  [phone]   Metta Clines 09/18/2021, 2:48 PM

## 2021-09-18 NOTE — Progress Notes (Signed)
Subjective: Patient reports that she is hungry. She has no other complaints. No acute events overnight.   Objective: Vital signs in last 24 hours: Temp:  [98.1 F (36.7 C)-98.3 F (36.8 C)] 98.1 F (36.7 C) (01/27 0400) Pulse Rate:  [51-79] 55 (01/27 0600) Resp:  [12-18] 16 (01/27 0600) BP: (98-137)/(55-88) 98/62 (01/27 0600) SpO2:  [90 %-96 %] 94 % (01/27 0600)  Intake/Output from previous day: 01/26 0701 - 01/27 0700 In: 650 [P.O.:500; IV Piggyback:150] Out: 1150 [Urine:1150] Intake/Output this shift: No intake/output data recorded.  Physical Exam: Patient continues to be drowsy but is able to maintain alertness while conversing. She is O X 4. She is in NAD and VSS. Speech is fluent and appropriate.  She is able to follow all commands easily.  MAEW. LUE and LLE 3/5. Sensation to light touch is intact. PERLA, EOMI. CNs grossly intact. Dressing is clean dry intact. Incision is well approximated with no drainage, erythema, or edema.   Lab Results: Recent Labs    09/17/21 0536 09/18/21 0339  WBC 12.8* 11.3*  HGB 13.1 12.8  HCT 38.9 38.5  PLT 239 244   BMET Recent Labs    09/17/21 0536 09/18/21 0339  NA 135 136  K 4.1 4.3  CL 102 101  CO2 26 28  GLUCOSE 151* 144*  BUN 21* 22*  CREATININE 0.62 0.64  CALCIUM 8.8* 8.9    Studies/Results: MR BRAIN WO CONTRAST  Result Date: 09/16/2021 CLINICAL DATA:  Postop tumor resection 09/15/2021. EXAM: MRI HEAD WITHOUT CONTRAST TECHNIQUE: Multiplanar, multiecho pulse sequences of the brain and surrounding structures were obtained without intravenous contrast. COMPARISON:  CT head 09/15/2021.  MRI head 09/09/2021 FINDINGS: Brain: Right temporal craniotomy for tumor debulking in the right temporal lobe. Large tumor is present which has been partially resected. There is hemorrhage within the posterior aspect of the tumor. Right temporal subdural fluid collection is similar to the CT yesterday. Decreased mass-effect compared with the  preoperative study. Minimal midline shift to the left has improved. Tumor extends into the prepontine cistern on the right with mass-effect on the right pons and midbrain. There is T2 hyperintensity in the midbrain and pons on the right which may be due to edema or tumor spread. This was present preoperatively Within the lateral aspect of the tumor, there is restricted diffusion compatible with acute infarct in the right temporal lobe laterally. Ventricle size remains normal. Vascular: Normal arterial flow voids at the skull base. Skull and upper cervical spine: Right temporal craniotomy. Sinuses/Orbits: Mild mucosal edema paranasal sinuses. Mastoid clear bilaterally. Negative orbit Other: None IMPRESSION: 1. Postop debulking of large right temporal lobe tumor. There is decreased size of tumor and decreased mass-effect and midline shift. 2. Right temporal subdural hematoma as noted on CT yesterday. Mild amount of restricted diffusion in the right lateral temporal lobe compatible with ischemic change. Electronically Signed   By: Franchot Gallo M.D.   On: 09/16/2021 19:05   ECHOCARDIOGRAM COMPLETE  Result Date: 09/16/2021    ECHOCARDIOGRAM REPORT   Patient Name:   DELMI FULFER Date of Exam: 09/16/2021 Medical Rec #:  [id number]    Height:       62.0 in Accession #:    [id number]   Weight:       125.0 lb Date of Birth:  1961/03/01   BSA:          1.566 m Patient Age:    61 years     BP:  111/60 mmHg Patient Gender: F            HR:           75 bpm. Exam Location:  Inpatient Procedure: 2D Echo, Cardiac Doppler and Color Doppler Indications:    Afib  History:        Patient has no prior history of Echocardiogram examinations.  Sonographer:    Glo Herring Referring Phys: [id number] [place]  1. Left ventricular ejection fraction, by estimation, is 65 to 70%. The left ventricle has hyperdynamic function. The left ventricle has no regional wall motion abnormalities. There is mild left  ventricular hypertrophy. Left ventricular diastolic parameters are indeterminate.  2. Right ventricular systolic function is normal. The right ventricular size is normal. There is normal pulmonary artery systolic pressure. The estimated right ventricular systolic pressure is 69.6 mmHg.  3. The mitral valve is normal in structure. No evidence of mitral valve regurgitation. No evidence of mitral stenosis.  4. The aortic valve is tricuspid. Aortic valve regurgitation is not visualized. No aortic stenosis is present.  5. The inferior vena cava is normal in size with greater than 50% respiratory variability, suggesting right atrial pressure of 3 mmHg. FINDINGS  Left Ventricle: Left ventricular ejection fraction, by estimation, is 65 to 70%. The left ventricle has hyperdynamic function. The left ventricle has no regional wall motion abnormalities. The left ventricular internal cavity size was normal in size. There is mild left ventricular hypertrophy. Left ventricular diastolic parameters are indeterminate. Right Ventricle: The right ventricular size is normal. No increase in right ventricular wall thickness. Right ventricular systolic function is normal. There is normal pulmonary artery systolic pressure. The tricuspid regurgitant velocity is 2.28 m/s, and  with an assumed right atrial pressure of 3 mmHg, the estimated right ventricular systolic pressure is 78.9 mmHg. Left Atrium: Left atrial size was normal in size. Right Atrium: Right atrial size was normal in size. Pericardium: There is no evidence of pericardial effusion. Mitral Valve: The mitral valve is normal in structure. No evidence of mitral valve regurgitation. No evidence of mitral valve stenosis. Tricuspid Valve: The tricuspid valve is normal in structure. Tricuspid valve regurgitation is trivial. Aortic Valve: The aortic valve is tricuspid. Aortic valve regurgitation is not visualized. No aortic stenosis is present. Aortic valve mean gradient measures 3.0  mmHg. Aortic valve peak gradient measures 5.3 mmHg. Aortic valve area, by VTI measures 1.82 cm. Pulmonic Valve: The pulmonic valve was normal in structure. Pulmonic valve regurgitation is trivial. Aorta: The aortic root is normal in size and structure. Venous: The inferior vena cava is normal in size with greater than 50% respiratory variability, suggesting right atrial pressure of 3 mmHg. IAS/Shunts: No atrial level shunt detected by color flow Doppler.  LEFT VENTRICLE PLAX 2D LVIDd:         3.40 cm LVIDs:         2.10 cm LV PW:         0.90 cm LV IVS:        1.40 cm LVOT diam:     1.60 cm LV SV:         37 LV SV Index:   24 LVOT Area:     2.01 cm  RIGHT VENTRICLE RV Basal diam:  2.90 cm LEFT ATRIUM         Index       RIGHT ATRIUM          Index LA diam:    2.10 cm  1.34 cm/m  RA Area:     9.25 cm                                 RA Volume:   13.90 ml 8.88 ml/m  AORTIC VALVE AV Area (Vmax):    1.73 cm AV Area (Vmean):   1.69 cm AV Area (VTI):     1.82 cm AV Vmax:           114.67 cm/s AV Vmean:          80.867 cm/s AV VTI:            0.203 m AV Peak Grad:      5.3 mmHg AV Mean Grad:      3.0 mmHg LVOT Vmax:         98.83 cm/s LVOT Vmean:        68.067 cm/s LVOT VTI:          0.184 m LVOT/AV VTI ratio: 0.90  AORTA Ao Root diam: 2.50 cm Ao Asc diam:  2.90 cm TRICUSPID VALVE TR Peak grad:   20.8 mmHg TR Vmax:        228.00 cm/s  SHUNTS Systemic VTI:  0.18 m Systemic Diam: 1.60 cm Dalton McleanMD Electronically signed by Franki Monte Signature Date/Time: 09/16/2021/4:56:30 PM    Final     Assessment/Plan: Patient is post-op day 3 s/p Right craniotomy temporal lobectomy. The patient is continuing to recover well. Her neurological exam is continuing to slowly improve. She continues to have left-sided weakness. She continues to have urinary retention and has required 2 I/O caths. Plan will be to start bethanechol 10 mg BID if urinary retention persists.  -Decadron 4mg  Q8H -Keppra 500 mg BID -PT/OT  -Plan  to transer patient out of the ICU today with possible discharge this weekend.      LOS: 3 days     Marvis Moeller, DNP, NP-C 09/18/2021, 8:30 AM

## 2021-09-18 NOTE — Progress Notes (Signed)
NAME:  Patricia Stevenson, MRN:  [id number], DOB:  1961/04/12, LOS: 3 ADMISSION DATE:  09/15/2021, CONSULTATION DATE:  09/15/2021 REFERRING MD:  Merrily Pew, NP, CHIEF COMPLAINT:  AMS   History of Present Illness:   61 year old Hispanic speaking female with prior history of HTN and HLD.  She was recently admitted 1/17 to 1/19 after having intermittent left sided weakness and left facial droop, near syncopal episodes at home, and family reports weight loss over the last year; no hx of seizures.  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  She returned 1/24 for a right craniotomy with tumor resection by Dr. Marcello Moores.  Underwent surgery without complication and was extubated postoperatively.  While in PACU, she was noted to have mental status change and was not following commands.  Went for stat CT head which revealed expected postop findings.  She was then transferred to ICU and PCCM consulted.   Pertinent  Medical History  R temporal brain mass, ? HTN but not on home meds   Significant Hospital Events: Including procedures, antibiotic start and stop dates in addition to other pertinent events   1/24 right craniotomy with right temporal lobe resection   Interim History / Subjective:  Tmax 98.3 1.15 L uop, 25 HV, -1.9L since admit admit  Has needed I&O cath x 2, as she is having urinary retention.   PO Cardizem, no further atrial fibrillation    Subjective: denies chest pain, SOB,  endorses head ache which is mild>> managing with Tylenol, no vision changes. States she feels she has less L sided weakness  Mag 2.6, K 4.3, Creatinine 0.64 WBC 11.3, HGB 12.8, Platelets 244 K  Objective   Blood pressure (!) 109/52, pulse 60, temperature 97.8 F (36.6 C), temperature source Oral, resp. rate 14, height 5\' 2"  (1.575 m), weight 56.7 kg, SpO2 92 %.        Intake/Output Summary (Last 24 hours) at 09/18/2021 0925 Last data filed at  09/18/2021 0645 Gross per 24 hour  Intake 650 ml  Output 1150 ml  Net -500 ml   Filed Weights   09/15/21 0545 09/15/21 0602  Weight: 56.7 kg 56.7 kg   Examination: General: In bed, NAD, appears comfortable HEENT: MM pink/moist, anicteric, atraumatic, dressing to Right side of head Neuro: RASS 0, PERRL 22mm, GCS 15, awake and appropriate, MAE x 4, only slight weakness notes with grips on Left compared to right  CV: S1S2, SR- SB, no m/r/g appreciated PULM:  Bilateral chest excursion, Clear throughout, slightly diminished per bases GI: soft, bsx4 active, non-tender   Extremities: warm/dry, no pretibial edema, capillary refill less than 3 seconds , no obvious abnormalities Skin:  no rashes or lesions noted, surgical sites C/D/I, no lesions    WBC 16.0>12.8 BUN 21  MRI Postop debulking on large right temporal tumor. Decreased mass effect and midline shift. Right temporal SDH. Hemo Vac removed 1/26>> No further drains  Resolved Hospital Problem list   Hyponatremia  Assessment & Plan:   Right temporal lobe mass s/p R craniotomy and tumor resection  - surgery uncomplicated, mental status change in PACU, postop CTH with expected postop changes. MRI postop stable Postop encephalopathy- resolved P:  -Management per neurosurgery -BP goals per neuro. PRN labetalol ordered -Decadron and keppra per NSGY -Follow surgical pathology>> no path results showing 1/27 -Continue multimodal pain control:Acetaminophen, vicodin, and morphine with bowel regimen -ABX per NSGY  New onset Afib with RVR Suspect secondary to surgical stress, however likely at risk for reoccurrence. 1/27 ECHO with LVEF of 65-70%. RVSF WNL. -Continue PO Cardizem 120 mg  daily -Goal K above 4, goal MG above 2 -Continue on tele -PRN EKG - her CHA2DS2-VASc score is 1 (for age) with adjusted stroke rate of 1.3% per year.   -Ac per NSGY -Will need cardiology follow up. Placing cards consult.  Leukocytosis WBC  16>12.8>11.3, expected post op, on steroids -Continue to monitor  Urinary Retention post indwelling foley cath.  Foley d/c'd 09/16/2021 I&O cath x 2 overnight  Plan If continued retention, 1/28 will be day 3 , so we will need to start bethanechol 10 mg BID per the post foley urinary retention order set  Pt. Has good recovery from a surgical and medical standpoint. Plan is to transfer out of ICU today per neurosurgery.    Best Practice (right click and "Reselect all SmartList Selections" daily)   Diet/type: Regular consistency (see orders)- advance as tolerated DVT prophylaxis: SCD GI prophylaxis: PPI Lines: N/A Foley:  N/A Code Status:  full code Last date of multidisciplinary goals of care discussion [per primary ]   Critical care time: NA mins    Magdalen Spatz, MSN, AGACNP-BC [place] for personal pager PCCM on call pager [phone]  [facility]  09/18/2021 , 9:25 AM  Please see [email or web address] for pager details  If no response, please call [phone] After hours, please call Elink at [phone]

## 2021-09-19 LAB — BASIC METABOLIC PANEL
Anion gap: 8 (ref 5–15)
BUN: 20 mg/dL (ref 6–20)
CO2: 26 mmol/L (ref 22–32)
Calcium: 8.5 mg/dL — ABNORMAL LOW (ref 8.9–10.3)
Chloride: 102 mmol/L (ref 98–111)
Creatinine, Ser: 0.58 mg/dL (ref 0.44–1.00)
GFR, Estimated: >60 mL/min (ref >60)
Glucose, Bld: 138 mg/dL — ABNORMAL HIGH (ref 70–99)
Potassium: 4.7 mmol/L (ref 3.5–5.1)
Sodium: 136 mmol/L (ref 135–145)

## 2021-09-19 LAB — CBC WITH DIFFERENTIAL/PLATELET
Abs Immature Granulocytes: 0.11 10*3/uL — ABNORMAL HIGH (ref 0.00–0.07)
Basophils Absolute: 0 10*3/uL (ref 0.0–0.1)
Basophils Relative: 0 %
Eosinophils Absolute: 0 10*3/uL (ref 0.0–0.5)
Eosinophils Relative: 0 %
HCT: 33.9 % — ABNORMAL LOW (ref 36.0–46.0)
Hemoglobin: 11.8 g/dL — ABNORMAL LOW (ref 12.0–15.0)
Immature Granulocytes: 1 %
Lymphocytes Relative: 9 %
Lymphs Abs: 0.9 10*3/uL (ref 0.7–4.0)
MCH: 30.5 pg (ref 26.0–34.0)
MCHC: 34.8 g/dL (ref 30.0–36.0)
MCV: 87.6 fL (ref 80.0–100.0)
Monocytes Absolute: 0.4 10*3/uL (ref 0.1–1.0)
Monocytes Relative: 4 %
Neutro Abs: 8.3 10*3/uL — ABNORMAL HIGH (ref 1.7–7.7)
Neutrophils Relative %: 86 %
Platelets: 229 10*3/uL (ref 150–400)
RBC: 3.87 MIL/uL (ref 3.87–5.11)
RDW: 12.9 % (ref 11.5–15.5)
WBC: 9.7 10*3/uL (ref 4.0–10.5)
nRBC: 0 % (ref 0.0–0.2)

## 2021-09-19 LAB — TYPE AND SCREEN
ABO/RH(D): B POS
Antibody Screen: NEGATIVE
Unit division: 0
Unit division: 0

## 2021-09-19 LAB — BPAM RBC
Blood Product Expiration Date: 202302102359
Blood Product Expiration Date: 202302102359
ISSUE DATE / TIME: 202301240803
ISSUE DATE / TIME: 202301240803
Unit Type and Rh: 7300
Unit Type and Rh: 7300

## 2021-09-19 LAB — MAGNESIUM: Magnesium: 2.1 mg/dL (ref 1.7–2.4)

## 2021-09-19 MED ORDER — HYDROCODONE-ACETAMINOPHEN 5-325 MG PO TABS: 1.0000 | ORAL_TABLET | ORAL | 0 refills | Status: DC | PRN

## 2021-09-19 MED ORDER — DOCUSATE SODIUM 100 MG PO CAPS: 100.0000 mg | ORAL_CAPSULE | Freq: Two times a day (BID) | ORAL | 2 refills | Status: DC

## 2021-09-19 MED ORDER — DEXAMETHASONE 2 MG PO TABS: ORAL_TABLET | ORAL | 0 refills | Status: AC

## 2021-09-19 MED ORDER — LEVETIRACETAM 500 MG PO TABS: 500.0000 mg | ORAL_TABLET | Freq: Two times a day (BID) | ORAL | 2 refills | Status: AC

## 2021-09-19 MED ORDER — PANTOPRAZOLE SODIUM 40 MG PO TBEC: 40.0000 mg | DELAYED_RELEASE_TABLET | Freq: Every day | ORAL | 0 refills | Status: DC

## 2021-09-19 MED ORDER — DILTIAZEM HCL ER COATED BEADS 120 MG PO CP24: 120.0000 mg | ORAL_CAPSULE | Freq: Every day | ORAL | 3 refills | Status: DC

## 2021-09-19 NOTE — TOC Transition Note (Addendum)
Transition of Care [facility]) - CM/SW Discharge Note   Patient Details  Name: Patricia Stevenson MRN: [id number] Date of Birth: 1960/10/27  Transition of Care [facility]) CM/SW Contact:  Konrad Penta, RN Phone Number: [phone] 09/19/2021, 1:43 PM   Clinical Narrative:   Received call from patient's nurse indicating [name] will need rolling walker and 3 in 1 bedside commode and [place] [facility]. Patient without insurance. Contacted home health agency that is doing charity this week- [facility]. Alwyn Ren with [facility] states she has to clear acceptance for charity with her leadership on call. Alwyn Ren will notify writer if she can accept charity home health PT referral.  DME to be delivered to room prior to transition.  Addendum 1440:  received notification from [place] with [facility] home health referral for PT accepted pending financials.     Final next level of care: [place] Barriers to Discharge: No Barriers Identified   Patient Goals and CMS Choice        Discharge Placement                       Discharge Plan and Services                DME Arranged: 3-N-1, Walker rolling DME Agency: [employer] Date DME Agency Contacted: 09/19/21 Time DME Agency Contacted: 1216 Representative spoke with at DME Agency: Mardene Celeste HH Arranged: PT [place]: [place] Date [place]: 09/19/21 Time [place]: 1250 Representative spoke with at [place]: [place] Interventions     Readmission Risk Interventions No flowsheet data found.

## 2021-09-19 NOTE — Progress Notes (Signed)
PROGRESS NOTE  Patricia Stevenson DJM:[id number] DOB: 09/15/1960 DOA: 09/15/2021 PCP: Isaac Bliss, Rayford Halsted, MD  HPI/Recap of past 24 hours: 61 year old Hispanic speaking female with prior history of HTN and HLD.  She was recently admitted 1/17 to 1/19 after having intermittent left sided weakness and left facial droop, near syncopal episodes at home, and family reports weight loss over the last year; no hx of seizures.  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  She returned 1/24 for a right craniotomy with tumor resection by neurosurgery, Dr. Marcello Moores.  Underwent neurosurgery without complication 2/22 and was extubated postoperatively.  While in PACU, she was noted to have mental status change and was not following commands.  Went for stat CT head which revealed expected postop findings.  She was then transferred to ICU and PCCM consulted.   Transferred to Hospitalist service, [place] assumed care on 09/19/21.  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 Marcello Moores. - surgery uncomplicated, mental status change in PACU, postop CTH with expected postop changes. MRI postop stable. Postop encephalopathy- resolved P:  -Management per neurosurgery -BP goals per neuro. PRN labetalol ordered BP normotensive 122/70 -Decadron 4 mg tid and keppra 500 mg bid per NSGY -Follow surgical pathology>> no path results showing 1/28, continue to follow -Continue multimodal pain control:Acetaminophen, vicodin, and morphine with bowel regimen -ABX per NSGY, not on abx on 09/19/21.   New onset Afib with RVR, now in sinus rhythm Suspect secondary to surgical stress, however likely at risk for  reoccurrence. 1/27 ECHO with LVEF of 65-70%. RVSF WNL. -Continue PO Cardizem 120 mg  daily -Goal K above 4, goal MG above 2 -Continue on tele -PRN EKG - her CHA2DS2-VASc score is 1 (for age) with adjusted stroke rate of 1.3% per year.   -Ac per NSGY -Will need cardiology follow up. Placing cards consult. Per cardiology, no plan for anticoagulation, signed off 1/27.   Resolved leukocytosis, likely reactive in the setting of steroid use and postop. WBC 16>12.8>11.3> 9.7, expected post op, on steroids Afebrile and nonseptic appearing.   Urinary Retention post indwelling foley cath.  Foley d/c'd 09/16/2021 I&O cath x 2 on 09/18/2021 Continue to closely monitor. If continued retention, 1/28 will be day 3 , so we will need to start bethanechol 10 mg BID per the post foley urinary retention order set      Best Practice (right click and "Reselect all SmartList Selections" daily)    Diet/type: Regular consistency (see orders)- advance as tolerated DVT prophylaxis: SCD GI prophylaxis: PPI Lines: N/A Foley:  N/A Code Status:  full code Last date of multidisciplinary goals of care discussion [per primary ]     Critical care time: 65 minutes.  Status is: Inpatient  Patient requires at least 2 midnights for further evaluation and treatment of present condition.      Objective: Vitals:   09/19/21 0400 09/19/21 0500 09/19/21 0600 09/19/21 0800  BP: 109/63 104/64 118/67 116/72  Pulse: 60 (!) 51 (!) 50 (!) 49  Resp: 16 16 12 12   Temp: 98.2 F (36.8 C)   98 F (36.7 C)  TempSrc: Oral   Oral  SpO2: 95% 91% 96% 96%  Weight:      Height:        Intake/Output Summary (Last 24 hours) at 09/19/2021 1022 Last data filed at 09/18/2021 2330 Gross per 24 hour  Intake --  Output 1225 ml  Net -1225 ml   Filed Weights   09/15/21 0545 09/15/21 0602  Weight: 56.7 kg 56.7 kg    Exam:  General: 61 y.o. year-old female well developed well nourished in no acute distress.  Alert and  oriented x3. Cardiovascular: Regular rate and rhythm with no rubs or gallops.  No thyromegaly or JVD noted.   Respiratory: Clear to auscultation with no wheezes or rales. Good inspiratory effort. Abdomen: Soft nontender nondistended with normal bowel sounds x4 quadrants. Musculoskeletal: No lower extremity edema. 2/4 pulses in all 4 extremities. Skin: No ulcerative lesions noted or rashes, Psychiatry: Mood is appropriate for condition and setting Neuro: Strength improved.  Sensation okay.  Speech okay.   Data Reviewed: CBC: Recent Labs  Lab 09/15/21 1817 09/16/21 0551 09/17/21 0536 09/18/21 0339 09/19/21 0244  WBC  --  16.0* 12.8* 11.3* 9.7  NEUTROABS  --  13.6*  --   --  8.3*  HGB 12.9 12.5 13.1 12.8 11.8*  HCT 38.0 38.0 38.9 38.5 33.9*  MCV  --  90.0 88.4 89.5 87.6  PLT  --  240 239 244 726   Basic Metabolic Panel: Recent Labs  Lab 09/15/21 1817 09/16/21 0551 09/17/21 0536 09/18/21 0339 09/19/21 0244  NA 138 134* 135 136 136  K 3.7 4.0 4.1 4.3 4.7  CL  --  103 102 101 102  CO2  --  21* 26 28 26   GLUCOSE  --  143* 151* 144* 138*  BUN  --  17 21* 22* 20  CREATININE  --  0.61 0.62 0.64 0.58  CALCIUM  --  8.5* 8.8* 8.9 8.5*  MG  --  2.6* 2.3 2.4 2.1   GFR: Estimated Creatinine Clearance: 60.6 mL/min (by C-G formula based on SCr of 0.58 mg/dL). Liver Function Tests: No results for input(s): AST, ALT, ALKPHOS, BILITOT, PROT, ALBUMIN in the last 168 hours. No results for input(s): LIPASE, AMYLASE in the last 168 hours. No results for input(s): AMMONIA in the last 168 hours. Coagulation Profile: No results for input(s): INR, PROTIME in the last 168 hours. Cardiac Enzymes: No results for input(s): CKTOTAL, CKMB, CKMBINDEX, TROPONINI in the last 168 hours. BNP (last 3 results) No results for input(s): PROBNP in the last 8760 hours. HbA1C: No results for input(s): HGBA1C in the last 72 hours. CBG: No results for input(s): GLUCAP in the last 168 hours. Lipid  Profile: No results for input(s): CHOL, HDL, LDLCALC, TRIG, CHOLHDL, LDLDIRECT in the last 72 hours. Thyroid Function Tests: Recent Labs    09/17/21 1247  TSH 0.224*   Anemia Panel: No results for input(s): VITAMINB12, FOLATE, FERRITIN, TIBC, IRON, RETICCTPCT in the last 72 hours. Urine analysis: No results found for: COLORURINE, APPEARANCEUR, LABSPEC, PHURINE, GLUCOSEU, HGBUR, BILIRUBINUR, KETONESUR, PROTEINUR, UROBILINOGEN, NITRITE, LEUKOCYTESUR Sepsis Labs: @LABRCNTIP (procalcitonin:4,lacticidven:4)  ) Recent Results (from the past 240 hour(s))  SARS Coronavirus 2 by RT PCR (hospital order, performed in [facility] hospital lab) Nasopharyngeal Nasopharyngeal Swab     Status: None   Collection Time: 09/15/21  5:47 AM   Specimen: Nasopharyngeal Swab  Result Value Ref Range Status   SARS Coronavirus 2 NEGATIVE NEGATIVE Final    Comment: (NOTE) SARS-CoV-2 target nucleic acids are NOT DETECTED.  The SARS-CoV-2 RNA  is generally detectable in upper and lower respiratory specimens during the acute phase of infection. The lowest concentration of SARS-CoV-2 viral copies this assay can detect is 250 copies / mL. A negative result does not preclude SARS-CoV-2 infection and should not be used as the sole basis for treatment or other patient management decisions.  A negative result may occur with improper specimen collection / handling, submission of specimen other than nasopharyngeal swab, presence of viral mutation(s) within the areas targeted by this assay, and inadequate number of viral copies (<250 copies / mL). A negative result must be combined with clinical observations, patient history, and epidemiological information.  Fact Sheet for Patients:   StrictlyIdeas.no  Fact Sheet for Healthcare Providers: BankingDealers.co.za  This test is not yet approved or  cleared by the [place] FDA and has been authorized for detection  and/or diagnosis of SARS-CoV-2 by FDA under an Emergency Use Authorization (EUA).  This EUA will remain in effect (meaning this test can be used) for the duration of the COVID-19 declaration under Section 564(b)(1) of the Act, 21 U.S.C. section 360bbb-3(b)(1), unless the authorization is terminated or revoked sooner.  Performed at [facility], [place] [address], [place], [place] [address]   MRSA Next Gen by PCR, Nasal     Status: None   Collection Time: 09/15/21  4:33 PM   Specimen: Nasal Mucosa; Nasal Swab  Result Value Ref Range Status   MRSA by PCR Next Gen NOT DETECTED NOT DETECTED Final    Comment: (NOTE) The GeneXpert MRSA Assay (FDA approved for NASAL specimens only), is one component of a comprehensive MRSA colonization surveillance program. It is not intended to diagnose MRSA infection nor to guide or monitor treatment for MRSA infections. Test performance is not FDA approved in patients less than 12 years old. Performed at [facility], [place] [address] [place], [place] [address]       Studies: No results found.  Scheduled Meds:  calcium carbonate  1,250 mg Oral BID WC   chlorhexidine  15 mL Mouth Rinse BID   Chlorhexidine Gluconate Cloth  6 each Topical Daily   cholecalciferol  1,000 Units Oral Daily   dexamethasone  4 mg Oral Q8H   diltiazem  120 mg Oral Daily   docusate sodium  100 mg Oral BID   levETIRAcetam  500 mg Oral BID   mouth rinse  15 mL Mouth Rinse q12n4p   pantoprazole  40 mg Oral QHS   polyethylene glycol  17 g Oral BID    Continuous Infusions:   LOS: 4 days     Kayleen Memos, MD [facility] Pager [phone]  If 7PM-7AM, please contact night-coverage [email or web address] Password [email or web address] 09/19/2021, 10:22 AM

## 2021-09-19 NOTE — Progress Notes (Signed)
Subjective: Patient reports no headaches, feels is doing well  Objective: Vital signs in last 24 hours: Temp:  [98 F (36.7 C)-98.3 F (36.8 C)] 98 F (36.7 C) (01/28 0800) Pulse Rate:  [49-72] 49 (01/28 0800) Resp:  [10-19] 12 (01/28 0800) BP: (98-136)/(57-84) 116/72 (01/28 0800) SpO2:  [91 %-97 %] 96 % (01/28 0800)  Intake/Output from previous day: 01/27 0701 - 01/28 0700 In: -  Out: 1225 [Urine:1225] Intake/Output this shift: No intake/output data recorded. NAD, awake, alert, Ox3, speech fluent Mild right periorbital swelling Mild left sided drift LLE 4/5 Incision c/d  Lab Results: Recent Labs    09/18/21 0339 09/19/21 0244  WBC 11.3* 9.7  HGB 12.8 11.8*  HCT 38.5 33.9*  PLT 244 229   BMET Recent Labs    09/18/21 0339 09/19/21 0244  NA 136 136  K 4.3 4.7  CL 101 102  CO2 28 26  GLUCOSE 144* 138*  BUN 22* 20  CREATININE 0.64 0.58  CALCIUM 8.9 8.5*    Studies/Results: No results found.  Assessment/Plan: 61 yo F s/p right temporal lobectomy for tumor - likely discharge today - slow dex taper - neuro-onc f/u - f/u path - cont Keppra - f/u in 2 weeks in clinic   Vallarie Mare 09/19/2021, 11:08 AM

## 2021-09-19 NOTE — Progress Notes (Signed)
Physical Therapy Treatment Patient Details Name: Patricia Stevenson MRN: [id number] DOB: 08-18-1961 Today's Date: 09/19/2021   History of Present Illness 61 yo female presents to ED on 1/17 with complaints of 46-month bilat UE weakness L>R, facial weakness/numbness. 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 d/c 1/19, readmitted 1/24 s/p R craniotomy, R temporal lobectomy. AMS event immediately post-op, head CT showing expected post-op findings, narcan administered with improved mentation. PMH Includes HTN, HLD.    PT Comments    Session focused on stair training in preparation to return home. Patient able to negotiate 10 stairs with L handrail, HHA on R, and min guard. Patient at supervision level for mobility with use of RW. Educated on importance of mobility on recovery process, patient and daughter verbalized understanding. Updated recommendation to HHPT at discharge.     Recommendations for follow up therapy are one component of a multi-disciplinary discharge planning process, led by the attending physician.  Recommendations may be updated based on patient status, additional functional criteria and insurance authorization.  Follow Up Recommendations  Home health PT     Assistance Recommended at Discharge Intermittent Supervision/Assistance  Patient can return home with the following A little help with walking and/or transfers;A little help with bathing/dressing/bathroom;Help with stairs or ramp for entrance;Assist for transportation;Assistance with cooking/housework   Equipment Recommendations  Rolling Patricia Stevenson (2 wheels)    Recommendations for Other Services       Precautions / Restrictions Precautions Precautions: Fall Restrictions Weight Bearing Restrictions: No     Mobility  Bed Mobility               General bed mobility comments: in recliner on arrival     Transfers Overall transfer level: Needs assistance Equipment used: Rolling Patricia Stevenson (2 wheels) Transfers: Sit to/from Stand Sit to Stand: Supervision                Ambulation/Gait Ambulation/Gait assistance: Supervision Gait Distance (Feet): 15 Feet Assistive device: Rolling Patricia Stevenson (2 wheels) Gait Pattern/deviations: Step-through pattern, Decreased stride length   Gait velocity interpretation: 1.31 - 2.62 ft/sec, indicative of limited community ambulator       Stairs Stairs: Yes Stairs assistance: Min guard Stair Management: One rail Left, Step to pattern, Forwards Number of Stairs: 10 General stair comments: HHA on opposite side of rail   Wheelchair Mobility    Modified Rankin (Stroke Patients Only)       Balance Overall balance assessment: Needs assistance Sitting-balance support: Feet supported Sitting balance-Leahy Scale: Good     Standing balance support: Bilateral upper extremity supported, Reliant on assistive device for balance Standing balance-Leahy Scale: Poor                              Cognition Arousal/Alertness: Awake/alert Behavior During Therapy: WFL for tasks assessed/performed Overall Cognitive Status: Within Functional Limits for tasks assessed                                          Exercises      General Comments        Pertinent Vitals/Pain Pain Assessment Pain Assessment: No/denies pain    Home Living  Prior Function            PT Goals (current goals can now be found in the care plan section) Acute Rehab PT Goals Patient Stated Goal: home PT Goal Formulation: With patient Time For Goal Achievement: 10/02/21 Potential to Achieve Goals: Good Progress towards PT goals: Progressing toward goals    Frequency    Min 4X/week      PT Plan Discharge plan needs to be updated    Co-evaluation              AM-PAC PT "6 Clicks" Mobility    Outcome Measure  Help needed turning from your back to your side while in a flat bed without using bedrails?: A Little Help needed moving from lying on your back to sitting on the side of a flat bed without using bedrails?: A Little Help needed moving to and from a bed to a chair (including a wheelchair)?: A Little Help needed standing up from a chair using your arms (e.g., wheelchair or bedside chair)?: A Little Help needed to walk in hospital room?: A Little Help needed climbing 3-5 steps with a railing? : A Little 6 Click Score: 18    End of Session Equipment Utilized During Treatment: Gait belt Activity Tolerance: Patient tolerated treatment well Patient left: in chair;with call bell/phone within reach;with family/visitor present Nurse Communication: Mobility status PT Visit Diagnosis: Other abnormalities of gait and mobility (R26.89);Muscle weakness (generalized) (M62.81)     Time: 1025-8527 PT Time Calculation (min) (ACUTE ONLY): 24 min  Charges:  $Gait Training: 23-37 mins                     Patricia Stevenson A. Patricia Stevenson PT, DPT Acute Rehabilitation Services Pager [phone] Office [phone]    Patricia Stevenson 09/19/2021, 12:18 PM

## 2021-09-19 NOTE — Progress Notes (Signed)
Occupational Therapy Treatment Patient Details Name: Patricia Stevenson MRN: [id number] DOB: 09/24/1960 Today's Date: 09/19/2021   History of present illness 61 yo female presents to ED on 1/17 with complaints of 67-month bilat UE weakness L>R, facial weakness/numbness. 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 d/c 1/19, readmitted 1/24 s/p R craniotomy, R temporal lobectomy. AMS event immediately post-op, head CT showing expected post-op findings, narcan administered with improved mentation. PMH Includes HTN, HLD.   OT comments  Patient with good progress towards patient focused goals.  Patient able to advance out of the bed, mobilize in the room and perform lower body ADL and stand grooming.  Patient is needing generalized supervision for all tasks.  She is moving at a slowed pace, and continues to close her eyes at times for comfort.  Overall she is demonstrating good safety, and will have adequate assist as needed at home.  Only issue to overcome will be navigating 15 stairs to her condo.  No follow OT is anticipated at home.     Recommendations for follow up therapy are one component of a multi-disciplinary discharge planning process, led by the attending physician.  Recommendations may be updated based on patient status, additional functional criteria and insurance authorization.    Follow Up Recommendations       Assistance Recommended at Discharge Intermittent Supervision/Assistance  Patient can return home with the following  Assistance with cooking/housework;Assist for transportation;Direct supervision/assist for medications management;A little help with walking and/or transfers;A little help with bathing/dressing/bathroom   Equipment Recommendations  BSC/3in1    Recommendations for Other Services      Precautions / Restrictions Precautions Precautions: Fall       Mobility Bed  Mobility Overal bed mobility: Needs Assistance Bed Mobility: Supine to Sit     Supine to sit: Supervision          Transfers Overall transfer level: Needs assistance Equipment used: Rolling walker (2 wheels) Transfers: Sit to/from Stand Sit to Stand: Supervision                 Balance Overall balance assessment: Needs assistance Sitting-balance support: Feet supported Sitting balance-Leahy Scale: Good     Standing balance support: Reliant on assistive device for balance Standing balance-Leahy Scale: Poor                             ADL either performed or assessed with clinical judgement   ADL       Grooming: Wash/dry hands;Wash/dry face;Supervision/safety;Standing           Upper Body Dressing : Supervision/safety;Standing   Lower Body Dressing: Supervision/safety;Sit to/from stand   Toilet Transfer: Supervision/safety;Ambulation;Regular Toilet   Toileting- Clothing Manipulation and Hygiene: Modified independent;Sit to/from stand       Functional mobility during ADLs: Supervision/safety;Rolling walker (2 wheels) General ADL Comments: slow pace with no LOB    Extremity/Trunk Assessment Upper Extremity Assessment Upper Extremity Assessment: Overall WFL for tasks assessed   Lower Extremity Assessment Lower Extremity Assessment: Defer to PT evaluation   Cervical / Trunk Assessment Cervical / Trunk Assessment: Normal    Vision Patient Visual Report: No change from baseline Additional Comments: likes to keep her eyes closed at imes for comfort to reduce dizziness.   Perception Perception Perception: Within Functional Limits   Praxis Praxis Praxis: Intact    Cognition Arousal/Alertness: Awake/alert Behavior During Therapy: [facility]  for tasks assessed/performed Overall Cognitive Status: Within Functional Limits for tasks assessed                                                             Pertinent Vitals/  Pain       Pain Assessment Pain Assessment: No/denies pain                                                          Frequency  Min 2X/week        Progress Toward Goals  OT Goals(current goals can now be found in the care plan section)  Progress towards OT goals: Progressing toward goals  Acute Rehab OT Goals Patient Stated Goal: Return home OT Goal Formulation: With patient Time For Goal Achievement: 10/02/21 Potential to Achieve Goals: Good ADL Goals Pt Will Perform Eating: with min assist;sitting Pt Will Perform Grooming: with modified independence;standing Pt Will Perform Upper Body Bathing: with min assist;sitting Pt Will Perform Lower Body Bathing: with modified independence;sit to/from stand Pt Will Perform Upper Body Dressing: with min assist;sitting Pt Will Perform Lower Body Dressing: with modified independence;sit to/from stand Pt Will Transfer to Toilet: with modified independence;ambulating;regular height toilet Additional ADL Goal #1: Patient will attend to the R visual field 75% of the time with min cues.  Plan Discharge plan needs to be updated    Co-evaluation                 AM-PAC OT "6 Clicks" Daily Activity     Outcome Measure   Help from another person eating meals?: None Help from another person taking care of personal grooming?: None Help from another person toileting, which includes using toliet, bedpan, or urinal?: A Little Help from another person bathing (including washing, rinsing, drying)?: A Little Help from another person to put on and taking off regular upper body clothing?: A Little Help from another person to put on and taking off regular lower body clothing?: A Little 6 Click Score: 20    End of Session Equipment Utilized During Treatment: Rolling walker (2 wheels)  OT Visit Diagnosis: Unsteadiness on feet (R26.81);Pain   Activity Tolerance Patient tolerated treatment well   Patient Left in  chair;with call bell/phone within reach;with family/visitor present   Nurse Communication Mobility status        Time: 6415-8309 OT Time Calculation (min): 26 min  Charges: OT General Charges $OT Visit: 1 Visit OT Treatments $Self Care/Home Management : 23-37 mins  09/19/2021  RP, OTR/L  Acute Rehabilitation Services  Office:  [phone]   Metta Clines 09/19/2021, 10:58 AM

## 2021-09-19 NOTE — Discharge Instructions (Signed)
Can remove dressings on 09/21/21 Walk as much as possible No heavy lifting >10 lbs No strenuous activity

## 2021-09-19 NOTE — Discharge Summary (Signed)
Physician Discharge Summary  Patient ID: Patricia Stevenson MRN: [id number] DOB/AGE: [birth year] 61 y.o.  Admit date: 09/15/2021 Discharge date: 09/19/2021  Admission Diagnoses:  Right temporal lobe tumor  Discharge Diagnoses:  Same Principal Problem:   Right temporal lobe mass Active Problems:   Brain tumor, glioma ([facility]CC)   Atrial fibrillation with RVR ([place])   Hypertension   Discharged Condition: Stable  Hospital Course:  Patricia Stevenson is a 61 y.o. female who underwent right temporal lobectomy for an extensive infiltrative brain tumor on 09/15/2021.  Postoperatively, she had initial mild worsening of her preoperative left-sided weakness and was initially somnolent.  A postop CT showed only expected postoperative changes she was admitted to the ICU and the critical care service was consulted.  An MRI was performed and demonstrated the expected subtotal resection with significantly improved mass-effect.  By postop day #2, she returned to her preoperative baseline.  She developed atrial fibrillation with RVR and was treated by the cardiology service with rate control and spontaneously reverted back to normal rhythm.  She was placed on diltiazem which she will continue upon discharge.  She was continued on Keppra and a dexamethasone slow taper was initiated.  PT and OT worked with her and she was deemed ready for discharge home on 09/19/2021.  Treatments: Surgery -right craniotomy for temporal lobectomy for tumor  Discharge Exam: Blood pressure 116/72, pulse (!) 49, temperature 98 F (36.7 C), temperature source Oral, resp. rate 12, height 5\' 2"  (1.575 m), weight 56.7 kg, SpO2 96 %. Awake, alert, oriented Speech fluent, appropriate CN grossly intact Left pronator drift, left lower extremity 4+/5 Mild right periorbital swelling Wound c/d/i  Disposition: Discharge disposition: 01-Home or Self Care        Allergies as of 09/19/2021       Reactions   Latex         Medication List      TAKE these medications    acetaminophen 325 MG tablet Commonly known as: TYLENOL Take 650 mg by mouth every 6 (six) hours as needed for moderate pain.   calcium carbonate 1500 (600 Ca) MG Tabs tablet Commonly known as: OSCAL Take by mouth 2 (two) times daily with a meal.   cholecalciferol 25 MCG (1000 UNIT) tablet Commonly known as: VITAMIN D3 Take 1,000 Units by mouth daily.   dexamethasone 2 MG tablet Commonly known as: DECADRON Take 2 tablets (4 mg total) by mouth every 8 (eight) hours for 2 days, THEN 2 tablets (4 mg total) every 12 (twelve) hours for 3 days, THEN 1 tablet (2 mg total) every 12 (twelve) hours for 3 days, THEN 0.5 tablets (1 mg total) every 12 (twelve) hours for 7 days. Start taking on: September 19, 2021 What changed:  medication strength See the new instructions.   dicyclomine 10 MG capsule Commonly known as: BENTYL Take 1 capsule (10 mg total) by mouth in the morning and at bedtime. As needed   docusate sodium 100 MG capsule Commonly known as: COLACE Take 1 capsule (100 mg total) by mouth 2 (two) times daily.   famotidine 20 MG tablet Commonly known as: Pepcid Take 1 tablet (20 mg total) by mouth 2 (two) times daily.   HYDROcodone-acetaminophen 5-325 MG tablet Commonly known as: NORCO/VICODIN Take 1 tablet by mouth every 4 (four) hours as needed for moderate pain.   levETIRAcetam 500 MG tablet Commonly known as: Keppra Take 1 tablet (500 mg total) by mouth 2 (two) times daily.   pantoprazole 40  MG tablet Commonly known as: PROTONIX Take 1 tablet (40 mg total) by mouth at bedtime.   VITA-C PO Take 1 tablet by mouth daily.        Follow-up Information     Janina Mayo, MD Follow up on 10/19/2021.   Specialty: Cardiology Why: at 10:00 AM Contact information: [address] [place] [address] [phone]         Vallarie Mare, MD. Schedule an appointment as soon as possible for a visit in 2 week(s).    Specialty: Neurosurgery Contact information: [address] [address] [phone]         Ventura Sellers, MD Follow up in 2 week(s).   Specialties: Psychiatry, Neurology, Oncology Contact information: [place] [address] [phone]                 Signed: Vallarie Mare 09/19/2021, 11:28 AM

## 2021-09-21 ENCOUNTER — Other Ambulatory Visit: Payer: Self-pay | Admitting: Radiation Therapy

## 2021-09-21 ENCOUNTER — Telehealth: Payer: Self-pay | Admitting: Internal Medicine

## 2021-09-21 NOTE — Telephone Encounter (Signed)
Scheduled appt per 1/30 staff msg from [facility]. Pt's daughter is aware of appt date and time.

## 2021-09-24 ENCOUNTER — Inpatient Hospital Stay: Payer: 59 | Attending: Internal Medicine | Admitting: Internal Medicine

## 2021-09-24 ENCOUNTER — Other Ambulatory Visit: Payer: Self-pay

## 2021-09-24 VITALS — BP 138/90 | HR 64 | Temp 98.6°F | Resp 18 | Wt 120.5 lb

## 2021-09-24 DIAGNOSIS — K219 Gastro-esophageal reflux disease without esophagitis: Secondary | ICD-10-CM | POA: Insufficient documentation

## 2021-09-24 DIAGNOSIS — Z79899 Other long term (current) drug therapy: Secondary | ICD-10-CM | POA: Insufficient documentation

## 2021-09-24 DIAGNOSIS — E785 Hyperlipidemia, unspecified: Secondary | ICD-10-CM | POA: Diagnosis not present

## 2021-09-24 DIAGNOSIS — C719 Malignant neoplasm of brain, unspecified: Secondary | ICD-10-CM | POA: Insufficient documentation

## 2021-09-24 DIAGNOSIS — G9389 Other specified disorders of brain: Secondary | ICD-10-CM

## 2021-09-24 DIAGNOSIS — E559 Vitamin D deficiency, unspecified: Secondary | ICD-10-CM | POA: Diagnosis not present

## 2021-09-24 NOTE — Progress Notes (Signed)
[facility] at [place], [place] [address] [phone]   New Patient Evaluation  Date of Service: 09/24/21 Patient Name: Patricia Stevenson Patient MRN: [id number] Patient DOB: March 14, 1961 Provider: Ventura Sellers, MD  Identifying Statement:  Patricia Stevenson is a 61 y.o. 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    Referring Provider: Isaac Bliss, Rayford Halsted, MD [address] [place],   [address]  Oncologic History: 09/15/21: Debulking right temporal craniotomy with Dr. Marcello Moores  Biomarkers:  MGMT Unknown.  IDH 1/2 Unknown.  EGFR Unknown  TERT Unknown   History of Present Illness: The patient's records from the referring physician were obtained and reviewed and the patient interviewed to confirm this HPI.  Patricia Stevenson  Medications: Current Outpatient Medications on File Prior to Visit  Medication Sig Dispense Refill   acetaminophen (TYLENOL) 325 MG tablet Take 650 mg by mouth every 6 (six) hours as needed for moderate pain.     Ascorbic Acid (VITA-C PO) Take 1 tablet by mouth daily.     calcium carbonate (OSCAL) 1500 (600 Ca) MG TABS tablet Take by mouth 2 (two) times daily with a meal.     cholecalciferol (VITAMIN D3) 25 MCG (1000 UNIT) tablet Take 1,000 Units by mouth daily.     dexamethasone (DECADRON) 2 MG tablet Take 2 tablets (4 mg total) by mouth every 8 (eight) hours for 2 days, THEN 2 tablets (4 mg total) every 12 (twelve) hours for 3 days, THEN 1 tablet (2 mg total) every 12 (twelve) hours for 3 days, THEN 0.5 tablets (1 mg total) every 12 (twelve) hours for 7 days. 37 tablet 0   dicyclomine (BENTYL) 10 MG capsule Take 1 capsule (10 mg total) by mouth in the morning and at bedtime. As needed 90 capsule 0   diltiazem (CARDIZEM CD) 120 MG 24 hr capsule Take 1 capsule (120 mg total) by mouth daily. 60 capsule 3   famotidine (PEPCID) 20 MG tablet Take 1  tablet (20 mg total) by mouth 2 (two) times daily. 60 tablet 3   levETIRAcetam (KEPPRA) 500 MG tablet Take 1 tablet (500 mg total) by mouth 2 (two) times daily. 60 tablet 2   No current facility-administered medications on file prior to visit.    Allergies:  Allergies  Allergen Reactions   Latex    Past Medical History:  Past Medical History:  Diagnosis Date   GERD (gastroesophageal reflux disease)    Hyperlipidemia    Vitamin D deficiency    Past Surgical History:  Past Surgical History:  Procedure Laterality Date   APPLICATION OF CRANIAL NAVIGATION Right 09/15/2021   Procedure: APPLICATION OF CRANIAL NAVIGATION;  Surgeon: Vallarie Mare, MD;  Location: [facility];  Service: Neurosurgery;  Laterality: Right;   BREAST BIOPSY Left    20 years ago in [place]- benign- not sure if bx or cyst   CHOLECYSTECTOMY     Laparoscopic cholecystectomy 04/29/2021   CRANIOTOMY Right 09/15/2021   Procedure: Right Craniotomy Temporal Lobectomy with Brain Lab;  Surgeon: Vallarie Mare, MD;  Location: [facility];  Service: Neurosurgery;  Laterality: Right;   Social History:  Social History   Socioeconomic History   Marital status: Married    Spouse name: Not on file   Number of children: Not on file   Years of education: Not on file   Highest education level: Not on file  Occupational History   Not on file  Tobacco Use   Smoking status: Never   Smokeless tobacco: Never  Vaping Use   Vaping Use: Not on file  Substance and Sexual Activity   Alcohol use: No   Drug use: No   Sexual activity: Not on file  Other Topics Concern   Not on file  Social History Narrative   Not on file   Social Determinants of Health   Financial Resource Strain: Not on file  Food Insecurity: Not on file  Transportation Needs: Not on file  Physical Activity: Not on file  Stress: Not on file  Social Connections: Not on file  Intimate Partner Violence: Not on file   Family History:  Family History   Problem Relation Age of Onset   Breast cancer Neg Hx    CAD Neg Hx     Review of Systems: Constitutional: Doesn't report fevers, chills or abnormal weight loss Eyes: Doesn't report blurriness of vision Ears, nose, mouth, throat, and face: Doesn't report sore throat Respiratory: Doesn't report cough, dyspnea or wheezes Cardiovascular: Doesn't report palpitation, chest discomfort  Gastrointestinal:  Doesn't report nausea, constipation, diarrhea GU: Doesn't report incontinence Skin: Doesn't report skin rashes Neurological: Per HPI Musculoskeletal: Doesn't report joint pain Behavioral/Psych: Doesn't report anxiety  Physical Exam: Vitals:   09/24/21 1400  BP: 138/90  Pulse: 64  Resp: 18  Temp: 98.6 F (37 C)  SpO2: 100%   KPS: 70. General: Alert, cooperative, pleasant, in no acute distress Head: Normal EENT: No conjunctival injection or scleral icterus.  Lungs: Resp effort normal Cardiac: Regular rate Abdomen: Non-distended abdomen Skin: No rashes cyanosis or petechiae. Extremities: No clubbing or edema  Neurologic Exam: Mental Status: Awake, alert, attentive to examiner. Oriented to self and environment. Language is fluent with intact comprehension.  Cranial Nerves: Visual acuity is grossly normal. Visual fields are full. 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 Face is symmetric Motor: Tone and bulk are normal. Power is 4+/5 in left arm and leg with noted drift. Reflexes are symmetric, no pathologic reflexes present.  Sensory: Intact to light touch Gait: Hemiparetic   Labs: I have reviewed the data as listed    Component Value Date/Time   NA 136 09/19/2021 0244   K 4.7 09/19/2021 0244   CL 102 09/19/2021 0244   CO2 26 09/19/2021 0244   GLUCOSE 138 (H) 09/19/2021 0244   BUN 20 09/19/2021 0244   CREATININE 0.58 09/19/2021 0244   CREATININE 0.66 05/21/2020 0811   CALCIUM 8.5 (L) 09/19/2021 0244   PROT 7.1 09/08/2021 1034   ALBUMIN 4.0 09/08/2021 1034    AST 16 09/08/2021 1034   ALT 17 09/08/2021 1034   ALKPHOS 74 09/08/2021 1034   BILITOT 0.4 09/08/2021 1034   GFRNONAA >60 09/19/2021 0244   Lab Results  Component Value Date   WBC 9.7 09/19/2021   NEUTROABS 8.3 (H) 09/19/2021   HGB 11.8 (L) 09/19/2021   HCT 33.9 (L) 09/19/2021   MCV 87.6 09/19/2021   PLT 229 09/19/2021    Imaging:  CT HEAD WO CONTRAST (5MM)  Result Date: 09/15/2021 CLINICAL DATA:  Postop brain biopsy today. Mental status change. Brain mass. EXAM: CT HEAD WITHOUT CONTRAST TECHNIQUE: Contiguous axial images were obtained from the base of the skull through the vertex without intravenous contrast. RADIATION DOSE REDUCTION: This exam was performed according to the departmental dose-optimization program which includes automated exposure control, adjustment of the mA and/or kV according to patient size and/or use of  iterative reconstruction technique. COMPARISON:  CT head 09/08/2021.  MRI head 09/08/2021. FINDINGS: Brain: Postop right temporal craniotomy for tumor resection. There appears to have been debulking of the tumor which is difficult to see on unenhanced CT. Previously the tumor was hyperdense and centered in the medial temporal lobe with surrounding edema. There is improvement in mass-effect and midline shift which is now mild to the left. Interval development of subdural pneumocephalus anteriorly bilaterally. Interval development of right temporal subdural hematoma measuring approximately 11 mm in thickness. Small amount of subarachnoid hemorrhage in the sylvian fissures. Small amount of gas in the suprasellar cistern and sylvian fissures. Ventricle size remains normal. No acute infarct. Vascular: Negative for hyperdense vessel Skull: Right temporal craniotomy. Craniotomy flap is well position. No subcutaneous fluid collection. Sinuses/Orbits: Mild mucosal edema left maxillary sinus. Remaining sinuses clear. Negative orbit bilaterally. Other: None IMPRESSION: Postop tumor  resection in the right temporal lobe. Postop pneumocephalus. Postop subdural hematoma right temporal lobe measuring 11 mm in thickness. Mild amount of subarachnoid hemorrhage. Improved mass-effect and midline shift compared with the preop study. Electronically Signed   By: Franchot Gallo M.D.   On: 09/15/2021 16:14   CT HEAD WO CONTRAST (5MM)  Result Date: 09/08/2021 CLINICAL DATA:  New onset left-sided weakness and right facial droop EXAM: CT HEAD WITHOUT CONTRAST TECHNIQUE: Contiguous axial images were obtained from the base of the skull through the vertex without intravenous contrast. RADIATION DOSE REDUCTION: This exam was performed according to the departmental dose-optimization program which includes automated exposure control, adjustment of the mA and/or kV according to patient size and/or use of iterative reconstruction technique. COMPARISON:  None. FINDINGS: Brain: Moderately hyperdense mass lesion in the right cerebral peduncle, midbrain, and thalamus measuring about 2.6 by 2.4 by 3.4 cm on image 33 series 4 and image 8 series 2. Adjacent substantial hypodensity particularly in white matter of the right temporal lobe and tracking in the posterior portion of the right internal capsule and right parietal lobe, primarily resembling vasogenic edema. There is about 6 mm of left-to-right midline shift at the level of the third ventricle on image 10 series 2. There is likely some effacement of the temporal horn of the right lateral ventricle, right sylvian fissure, and of the right suprasellar cistern. No overt hydrocephalus. Vascular: The hyperdense right-sided mass does partially abut the right cavernous sinus and right ICA although I am skeptical that this is an aneurysm based on its contour. Skull: Unremarkable Sinuses/Orbits: Unremarkable Other: No supplemental non-categorized findings. IMPRESSION: 1. 3.4 cm in long axis hyperdense but somewhat indistinctly marginated mass like lesion along the right  cerebral peduncle, right thalamus, and with potential involvement of the right mid brain. There is primarily vasogenic edema in the right temporal lobe and adjacent right parietal lobe and along adjacent white matter tracks along with effacement of part of the right suprasellar cistern, sylvian fissure, and temporal horn of the right lateral ventricle along with 6 mm of right to left midline shift of the third ventricle. More likely possibilities include mass lesions such as high-grade glioma, hemorrhagic infarct simulating a mass like appearance, or conceivably abscess. MRI brain with and without contrast is recommended for further characterization. Critical Value/emergent results were called by telephone at the time of interpretation on 09/08/2021 at 9:31 am to provider [facility] , who verbally acknowledged these results. The patient was imaged as an outpatient, and Dr. Deniece Ree is arranging for the patient to proceed immediately to the emergency department on an emergent  basis. Electronically Signed   By: Van Clines M.D.   On: 09/08/2021 09:33   MR BRAIN WO CONTRAST  Result Date: 09/16/2021 CLINICAL DATA:  Postop tumor resection 09/15/2021. EXAM: MRI HEAD WITHOUT CONTRAST TECHNIQUE: Multiplanar, multiecho pulse sequences of the brain and surrounding structures were obtained without intravenous contrast. COMPARISON:  CT head 09/15/2021.  MRI head 09/09/2021 FINDINGS: Brain: Right temporal craniotomy for tumor debulking in the right temporal lobe. Large tumor is present which has been partially resected. There is hemorrhage within the posterior aspect of the tumor. Right temporal subdural fluid collection is similar to the CT yesterday. Decreased mass-effect compared with the preoperative study. Minimal midline shift to the left has improved. Tumor extends into the prepontine cistern on the right with mass-effect on the right pons and midbrain. There is T2 hyperintensity in the midbrain and  pons on the right which may be due to edema or tumor spread. This was present preoperatively Within the lateral aspect of the tumor, there is restricted diffusion compatible with acute infarct in the right temporal lobe laterally. Ventricle size remains normal. Vascular: Normal arterial flow voids at the skull base. Skull and upper cervical spine: Right temporal craniotomy. Sinuses/Orbits: Mild mucosal edema paranasal sinuses. Mastoid clear bilaterally. Negative orbit Other: None IMPRESSION: 1. Postop debulking of large right temporal lobe tumor. There is decreased size of tumor and decreased mass-effect and midline shift. 2. Right temporal subdural hematoma as noted on CT yesterday. Mild amount of restricted diffusion in the right lateral temporal lobe compatible with ischemic change. Electronically Signed   By: Franchot Gallo M.D.   On: 09/16/2021 19:05   MR BRAIN WO CONTRAST  Result Date: 09/09/2021 CLINICAL DATA:  Follow-up examination for CNS neoplasm, BrainLAB protocol for surgical planning. EXAM: MRI HEAD WITHOUT CONTRAST TECHNIQUE: Multiplanar, multiecho pulse sequences of the brain and surrounding structures were obtained without intravenous contrast. COMPARISON:  Prior MRI from 09/08/2021. FINDINGS: Brain: Again seen is a large infiltrating mass centered at the mesial right temporal lobe. Mass again seen to infiltrate the insula and basal ganglia, with extension into the right cerebral peduncle, midbrain, and pons. No associated hemorrhage. Associated regional mass effect with partial effacement of the right lateral ventricle and 5 mm of right-to-left shift. No hydrocephalus or trapping. Basilar cistern crowding without effacement. Appearance is stable from prior. Vascular: Major intracranial vascular flow voids are maintained. Right MCA is partially encased by the right temporal mass. Skull and upper cervical spine: Craniocervical junction normal. Bone marrow signal intensity within normal limits. No  scalp soft tissue abnormality. Sinuses/Orbits: Globes and orbital soft tissues demonstrate no acute finding. Paranasal sinuses and mastoid air cells remain clear. Other: None. IMPRESSION: Stable size and appearance of infiltrating mass centered at the mesial right temporal lobe with associated regional mass effect and 5 mm of right-to-left shift. Examination will be used for surgical planning purposes. Electronically Signed   By: Jeannine Boga M.D.   On: 09/09/2021 21:53   MR Brain W and Wo Contrast  Result Date: 09/08/2021 CLINICAL DATA:  Abnormal head CT.  Evaluate brain mass. EXAM: MRI HEAD WITHOUT AND WITH CONTRAST TECHNIQUE: Multiplanar, multiecho pulse sequences of the brain and surrounding structures were obtained without and with intravenous contrast. CONTRAST:  5.88m GADAVIST GADOBUTROL 1 MMOL/ML IV SOLN COMPARISON:  CT head 09/08/2021 FINDINGS: Brain: Infiltrating mass lesion centered in the right medial temporal lobe. The mass infiltrates most of the right temporal lobe and extends into the insula. There is extension into the right  lateral basal ganglia. There is also T2 hyperintensity in the right pons and midbrain likely due to tumor infiltration. There is local mass-effect and 5 mm midline shift to the left. No associated hemorrhage. The mass shows a degree of restricted diffusion. The mass does not show significant enhancement. Ventricle size normal. 5 mm midline shift to the left. Negative for acute infarct. Vascular: Normal arterial flow voids. Skull and upper cervical spine: Negative Sinuses/Orbits: Paranasal sinuses clear.  Negative orbit Other: None IMPRESSION: Large infiltrating mass centered in the right medial temporal lobe. No associated enhancement or hemorrhage. Favor high-grade glioma such as glioblastoma. Electronically Signed   By: Franchot Gallo M.D.   On: 09/08/2021 13:34   ECHOCARDIOGRAM COMPLETE  Result Date: 09/16/2021    ECHOCARDIOGRAM REPORT   Patient Name:   LEAUNA SHARBER Date of Exam: 09/16/2021 Medical Rec #:  [id number]    Height:       62.0 in Accession #:    [id number]   Weight:       125.0 lb Date of Birth:  1961-04-13   BSA:          1.566 m Patient Age:    24 years     BP:           111/60 mmHg Patient Gender: F            HR:           75 bpm. Exam Location:  Inpatient Procedure: 2D Echo, Cardiac Doppler and Color Doppler Indications:    Afib  History:        Patient has no prior history of Echocardiogram examinations.  Sonographer:    Glo Herring Referring Phys: [id number] [place]  1. Left ventricular ejection fraction, by estimation, is 65 to 70%. The left ventricle has hyperdynamic function. The left ventricle has no regional wall motion abnormalities. There is mild left ventricular hypertrophy. Left ventricular diastolic parameters are indeterminate.  2. Right ventricular systolic function is normal. The right ventricular size is normal. There is normal pulmonary artery systolic pressure. The estimated right ventricular systolic pressure is 45.3 mmHg.  3. The mitral valve is normal in structure. No evidence of mitral valve regurgitation. No evidence of mitral stenosis.  4. The aortic valve is tricuspid. Aortic valve regurgitation is not visualized. No aortic stenosis is present.  5. The inferior vena cava is normal in size with greater than 50% respiratory variability, suggesting right atrial pressure of 3 mmHg. FINDINGS  Left Ventricle: Left ventricular ejection fraction, by estimation, is 65 to 70%. The left ventricle has hyperdynamic function. The left ventricle has no regional wall motion abnormalities. The left ventricular internal cavity size was normal in size. There is mild left ventricular hypertrophy. Left ventricular diastolic parameters are indeterminate. Right Ventricle: The right ventricular size is normal. No increase in right ventricular wall thickness. Right ventricular systolic function is normal. There is normal pulmonary  artery systolic pressure. The tricuspid regurgitant velocity is 2.28 m/s, and  with an assumed right atrial pressure of 3 mmHg, the estimated right ventricular systolic pressure is 64.6 mmHg. Left Atrium: Left atrial size was normal in size. Right Atrium: Right atrial size was normal in size. Pericardium: There is no evidence of pericardial effusion. Mitral Valve: The mitral valve is normal in structure. No evidence of mitral valve regurgitation. No evidence of mitral valve stenosis. Tricuspid Valve: The tricuspid valve is normal in structure. Tricuspid valve regurgitation is trivial. Aortic Valve: The aortic  valve is tricuspid. Aortic valve regurgitation is not visualized. No aortic stenosis is present. Aortic valve mean gradient measures 3.0 mmHg. Aortic valve peak gradient measures 5.3 mmHg. Aortic valve area, by VTI measures 1.82 cm. Pulmonic Valve: The pulmonic valve was normal in structure. Pulmonic valve regurgitation is trivial. Aorta: The aortic root is normal in size and structure. Venous: The inferior vena cava is normal in size with greater than 50% respiratory variability, suggesting right atrial pressure of 3 mmHg. IAS/Shunts: No atrial level shunt detected by color flow Doppler.  LEFT VENTRICLE PLAX 2D LVIDd:         3.40 cm LVIDs:         2.10 cm LV PW:         0.90 cm LV IVS:        1.40 cm LVOT diam:     1.60 cm LV SV:         37 LV SV Index:   24 LVOT Area:     2.01 cm  RIGHT VENTRICLE RV Basal diam:  2.90 cm LEFT ATRIUM         Index       RIGHT ATRIUM          Index LA diam:    2.10 cm 1.34 cm/m  RA Area:     9.25 cm                                 RA Volume:   13.90 ml 8.88 ml/m  AORTIC VALVE AV Area (Vmax):    1.73 cm AV Area (Vmean):   1.69 cm AV Area (VTI):     1.82 cm AV Vmax:           114.67 cm/s AV Vmean:          80.867 cm/s AV VTI:            0.203 m AV Peak Grad:      5.3 mmHg AV Mean Grad:      3.0 mmHg LVOT Vmax:         98.83 cm/s LVOT Vmean:        68.067 cm/s LVOT VTI:           0.184 m LVOT/AV VTI ratio: 0.90  AORTA Ao Root diam: 2.50 cm Ao Asc diam:  2.90 cm TRICUSPID VALVE TR Peak grad:   20.8 mmHg TR Vmax:        228.00 cm/s  SHUNTS Systemic VTI:  0.18 m Systemic Diam: 1.60 cm Dalton McleanMD Electronically signed by Franki Monte Signature Date/Time: 09/16/2021/4:56:30 PM    Final     Pathology: pending   Assessment/Plan Right temporal lobe mass  We appreciate the opportunity to participate in the care of SHANIA BJELLAND.  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  Surgery was less than 2 weeks prior, and pathology is still pending at this time.  That said, pattern on imaging and intra-operative reports are highly suggestive of infiltrating glioma.  We had an extensive conversation with her, and her family, regarding pathology, prognosis, and available treatment pathways in infiltrating glioma.    Pending histology, we ultimately recommended proceeding with course of intensity modulated radiation therapy and concurrent daily Temozolomide.  Radiation will be administered Mon-Fri over 6 weeks, Temodar will be dosed at 55m/m2 to be given daily over 42  days.  We reviewed side effects of temodar, including fatigue, nausea/vomiting, constipation, and cytopenias.  Informed consent was verbally obtained at bedside to proceed with oral chemotherapy.  Chemotherapy should be held for the following:  ANC less than 1,000  Platelets less than 100,000  LFT or creatinine greater than 2x ULN  If clinical concerns/contraindications develop  Every 2 weeks during radiation, labs will be checked accompanied by a clinical evaluation in the brain tumor clinic.  Keppra should be maintained at 548m BID.  Decadron taper can continue as instructed.   We also recommended arranging home physical and occupational therapy given hemiparesis, gait dysfunction.  Screening for  potential clinical trials was performed and discussed using eligibility criteria for [facility], as well as national database available on [email or web address]    The patient is not a candidate for a research protocol at this time due to  path pending .   We spent twenty additional minutes teaching regarding the natural history, biology, and historical experience in the treatment of brain tumors. We then discussed in detail the current recommendations for therapy focusing on the mode of administration, mechanism of action, anticipated toxicities, and quality of life issues associated with this plan. We also provided teaching sheets for the patient to take home as an additional resource.  We will give the patient and her daughter a call once path is finalized.  All questions were answered. The patient knows to call the clinic with any problems, questions or concerns. No barriers to learning were detected.  The total time spent in the encounter was 60 minutes and more than 50% was on counseling and review of test results   ZVentura Sellers MD Medical Director of Neuro-Oncology [facility]02/02/23 2:20 PM

## 2021-09-25 ENCOUNTER — Telehealth: Payer: Self-pay | Admitting: Internal Medicine

## 2021-09-25 NOTE — Telephone Encounter (Signed)
Scheduled per 2/2 los, pt daughter has been called and confirmed appt

## 2021-09-28 ENCOUNTER — Other Ambulatory Visit: Payer: Self-pay | Admitting: Radiation Therapy

## 2021-09-28 ENCOUNTER — Inpatient Hospital Stay: Payer: 59

## 2021-09-28 ENCOUNTER — Other Ambulatory Visit: Payer: Self-pay | Admitting: *Deleted

## 2021-09-28 NOTE — Progress Notes (Addendum)
Reached out to [facility] ([facility]viously [facility]) about PT and OT referral with spanish speaking therapists.  They will see if they can get her set up and will let us know otherwise.  Received call back from [facility] advising that hospital placed orders already for PT/OT and they are set up with [place].

## 2021-10-05 ENCOUNTER — Inpatient Hospital Stay: Payer: 59

## 2021-10-06 ENCOUNTER — Encounter (HOSPITAL_COMMUNITY): Payer: Self-pay

## 2021-10-06 LAB — SURGICAL PATHOLOGY

## 2021-10-08 ENCOUNTER — Telehealth: Payer: Self-pay | Admitting: Pharmacist

## 2021-10-08 ENCOUNTER — Encounter: Payer: Self-pay | Admitting: Internal Medicine

## 2021-10-08 ENCOUNTER — Telehealth: Payer: Self-pay | Admitting: Radiation Therapy

## 2021-10-08 ENCOUNTER — Other Ambulatory Visit (HOSPITAL_COMMUNITY): Payer: Self-pay

## 2021-10-08 ENCOUNTER — Telehealth: Payer: Self-pay

## 2021-10-08 ENCOUNTER — Inpatient Hospital Stay (HOSPITAL_BASED_OUTPATIENT_CLINIC_OR_DEPARTMENT_OTHER): Payer: 59 | Admitting: Internal Medicine

## 2021-10-08 DIAGNOSIS — C719 Malignant neoplasm of brain, unspecified: Secondary | ICD-10-CM | POA: Diagnosis not present

## 2021-10-08 MED ORDER — TEMOZOLOMIDE 100 MG PO CAPS
100.0000 mg | ORAL_CAPSULE | Freq: Every day | ORAL | 0 refills | Status: DC
  Filled 2021-10-08: qty 45, 45d supply, fill #0

## 2021-10-08 MED ORDER — ONDANSETRON HCL 8 MG PO TABS
8.0000 mg | ORAL_TABLET | Freq: Two times a day (BID) | ORAL | 1 refills | Status: DC | PRN
  Filled 2021-10-08: qty 30, 15d supply, fill #0

## 2021-10-08 NOTE — Telephone Encounter (Signed)
Oral Oncology Patient Advocate Encounter   Received notification from [facility] that prior authorization for Temodar is required.   PA submitted on CoverMyMeds Key [id number]  Status is pending   Oral Oncology Clinic will continue to follow.  [place] Patient [place] Phone [phone] Fax [phone] 10/08/2021 1:14 PM

## 2021-10-08 NOTE — Telephone Encounter (Signed)
Called and left a detailed message about pt's upcoming appointment with Dr. Lisbeth Renshaw 2/23. My contact information was included and a request to call back if she has questions regarding her consult and CT simulation for treatment planning. I explained that after her meeting with Dr. Lisbeth Renshaw, if she is in agreement to move forward with treatment, we will start an IV and make a special mask that she will wear during her radiation treatment, then complete a head CT scan while she is wearing that mask. I also shared that she will not receive a treatment that day, these are procedures needed for her radiation treatment planning.   Mont Dutton R.T.(R)(T) Radiation Special Procedures Navigator

## 2021-10-08 NOTE — Telephone Encounter (Addendum)
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  BMP and CBC w/ Diff from 09/19/21 assessed, no relevant lab abnormalities noted.  Current medication list in Epic reviewed, no relevant/significant DDIs with Temodar identified. Prescription dose and frequency assessed for appropriateness. Dosing confirmed with Dr. Mickeal Skinner that patient will be taking temozolomide 100 mg PO daily for 42 days.   Evaluated chart and no patient barriers to medication adherence noted.   Patient agreement for treatment documented in MD note on 10/08/21.  Patient's insurance requires temozolomide to be filled through [facility]. Prescription redirected to [facility] for dispensing.   Oral Oncology Clinic will continue to follow for insurance authorization, copayment issues, initial counseling and start date.  Leron Croak, PharmD, BCPS Hematology/Oncology Clinical Pharmacist [facility] [place] [phone] 10/08/2021 1:12 PM

## 2021-10-08 NOTE — Progress Notes (Signed)

## 2021-10-08 NOTE — Progress Notes (Signed)
I connected with Patricia Stevenson on 10/08/21 at 11:00 AM EST by telephone visit and verified that I am speaking with the correct person using two identifiers.  I discussed the limitations, risks, security and privacy concerns of performing an evaluation and management service by telemedicine and the availability of in-person appointments. I also discussed with the patient that there may be a patient responsible charge related to this service. The patient expressed understanding and agreed to proceed.  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  She does describe some shaking, trembling of her right hand when she wakes up.  This tends to recur at times throughout the day, but does not interfere significantly with function.  They are interested in reviewing the pathology report.  Observations: Language and cognition at baseline  SURGICAL PATHOLOGY SURGICAL PATHOLOGY  CASE: [id number]  PATIENT: [name]  Surgical Pathology Report      Clinical History: brain mass (cm)      FINAL MICROSCOPIC DIAGNOSIS:   A. BRAIN TUMOR, RIGHT TEMPORAL LOBE NEOCORTEX, RESECTION:  - High-grade glioma (see note).  (Outside consultation diagnosis).   B. BRAIN TUMOR, RIGHT TEMPORAL LOBE NEOCORTEX, RESECTION:  - High-grade glioma (see note).  (Outside consultation diagnosis).   COMMENT:  This case is sent to [facility] for  consultation.  The note section of the report is quoted as follows:   "Sections show an infiltrating glial neoplasm of moderate to high  cellularity composed of tumor cells with moderately pleomorphic  hyperchromatic nuclei embedded within a fibrillar background.  Mitotic  activity is  focally brisk.  Microvascular proliferation and necrosis are  not seen.   Immunohistochemical stains performed at [facility] demonstrate that the tumor  cells are immunoreactive for GFAP and olig2.  ATRX expression is  retained and p53 labels rare tumor cells.  The Ki-67 labeling index is  heterogeneous and is focally high (10-12%).  In areas of preserved  antigenicity, tumor cells are negative for IDH1 (p.R132H) and H3 (K27M)  mutant proteins.  HeK31m3 immunostains are weak but overall appear  retained within tumor cells.  Synaptophysin highlights the infiltrative  nature of the tumor.  CD163 and Iba-1 highlight background microglia and  macrophages.   Overall, the findings are those of a high-grade glioma but are not  diagnostic of a particular entity.  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  However, given this patient's relatively young age, a  non-canonical mutation in [facility] IDH2 cannot be entirely excluded.  Other IDH wild-type and H3 wild-type high-grade gliomas remain in the  differential.   NGS has been ordered to help with classification and to identify  potential therapeutic targets."   [facility] session number is [id number]   INTRAOPERATIVE DIAGNOSIS:  A. Brain Tumor, Right Temporal Lobe Neocortex, Resection: "Mildly  hypercellular brain with irregularity."  Intraoperative diagnosis rendered by Dr. PAlric Setonat 11:35 on 15 September 2021.   GROSS DESCRIPTION:  A. Received fresh for intraoperative consultation labeled with the  patient's name and DOB is a 7.6 x 6.2 x 1.6 cm tan piece of brain with  gyri on one aspect and  friable, discohesive white matter on the  opposite. A piece is placed on a single chuck for frozen section  diagnosis and subsequently in cassette A1 for permanent in addition to  squash prep slides. Additional representative sections  are submitted in  cassettes A2-A7.    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  We discussed the pathology report in detail, as well as prognosis, and available treatment pathways and effects of those treatments.    We ultimately recommended proceeding with course of intensity modulated radiation therapy and concurrent daily Temozolomide.  Radiation will be administered Mon-Fri over 6 weeks, Temodar will be dosed at 12m/m2 to be given daily over 42 days.  We reviewed side effects of temodar, including fatigue, nausea/vomiting, constipation, and cytopenias.  Informed consent was verbally obtained at bedside to proceed with oral chemotherapy.  Chemotherapy should be held for the following:  ANC less than 1,000  Platelets less than 100,000  LFT or creatinine greater than 2x ULN  If clinical concerns/contraindications develop  Every 2 weeks during radiation, labs will be checked accompanied by a clinical evaluation in the brain tumor clinic.  She will continue Keppra in the interim.  I discussed the assessment and treatment plan with the patient.  The patient was provided an opportunity to ask questions and all were answered.  The patient agreed with the plan and demonstrated understanding of the instructions.    The patient was advised to call back or seek an in-person evaluation if the symptoms worsen or if the condition fails to improve as anticipated.  I provided 5-10 minutes of non-face-to-face time during this enocunter.  ZVentura Sellers MD   I provided 22 minutes of non face-to-face telephone visit time during this encounter, and > 50% was spent counseling as documented under my assessment & plan.

## 2021-10-09 MED ORDER — TEMOZOLOMIDE 100 MG PO CAPS: 100.0000 mg | ORAL_CAPSULE | Freq: Every day | ORAL | 0 refills | Status: DC

## 2021-10-09 NOTE — Telephone Encounter (Signed)
Oral Oncology Patient Advocate Encounter  Prior Authorization for Temodar has been approved.    PA# [id number] Effective dates: 10/08/21 through 10/08/22  Patient must fill at [facility] will continue to follow.   [place] Patient [place] Phone [phone] Fax [phone] 10/09/2021 8:39 AM

## 2021-10-10 ENCOUNTER — Encounter: Payer: Self-pay | Admitting: Internal Medicine

## 2021-10-12 NOTE — Progress Notes (Signed)
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   HISTORY OF PRESENT ILLNESS: Patricia Stevenson is a 61 y.o. female seen at the request of Dr. Marcello Moores for a diagnosis of high-grade glioma.  The patient presented with strokelike symptoms with weakness in her right and left side in an alternating fashion.  There was drooping in her face on the right side and a CT of the head was performed on 09/08/2021 and showed hyperdense mass in the right midbrain and thalamus measuring up to 3.4 cm.  She was sent to the emergency department and MRI showed the mass infiltrating most of the right temporal lobe and extending into the insula with extension into the right lateral basal ganglia and a 5 mm midline shift to the left.  No evidence of infarct was identified.  She underwent craniotomy on 09/15/2021, and postoperative MRI scan showed evidence of craniotomy and tumor debulking but large tumor was still present and felt to be partially resected.  A subdural temporal fluid collection was noted and decreased mass effect compared to preoperative study was seen.  Final pathology confirmed high-grade glioma and her tumor was also sent to [facility] for second opinion but lack of IDH staining could not further clarify the diagnosis.  She has met with Dr. Mickeal Skinner who has recommended chemoradiation.  She is seen today to discuss the radiotherapy portion.    PREVIOUS RADIATION THERAPY: No   PAST MEDICAL HISTORY:  Past Medical History:  Diagnosis Date   GERD (gastroesophageal reflux disease)    Hyperlipidemia    Vitamin D deficiency        PAST SURGICAL HISTORY: Past Surgical History:  Procedure  Laterality Date   APPLICATION OF CRANIAL NAVIGATION Right 09/15/2021   Procedure: APPLICATION OF CRANIAL NAVIGATION;  Surgeon: Vallarie Mare, MD;  Location: [place];  Service: Neurosurgery;  Laterality: Right;   BREAST BIOPSY Left    20 years ago in [place]- benign- not sure if bx or cyst   CHOLECYSTECTOMY     Laparoscopic cholecystectomy 04/29/2021   CRANIOTOMY Right 09/15/2021   Procedure: Right Craniotomy Temporal Lobectomy with Brain Lab;  Surgeon: Vallarie Mare, MD;  Location: [place];  Service: Neurosurgery;  Laterality: Right;     FAMILY HISTORY:  Family History  Problem Relation Age of Onset   Breast cancer Neg Hx    CAD Neg Hx      SOCIAL HISTORY:  reports that she has never smoked. She has never used smokeless tobacco. She reports that she does not drink alcohol and does not use drugs.  The patient is married and lives in [place].  She is originally from [place] and has an adult daughter, [name] who joins [place] by phone. She's accompanied by her sister.   ALLERGIES: Latex   MEDICATIONS:  Current Outpatient Medications  Medication Sig Dispense Refill   acetaminophen (TYLENOL) 325 MG tablet Take 650 mg by mouth every 6 (six) hours as needed for moderate pain.     Ascorbic Acid (VITA-C PO) Take 1 tablet by mouth daily.     calcium carbonate (OSCAL) 1500 (600 Ca) MG TABS tablet  Take by mouth 2 (two) times daily with a meal.     cholecalciferol (VITAMIN D3) 25 MCG (1000 UNIT) tablet Take 1,000 Units by mouth daily.     dicyclomine (BENTYL) 10 MG capsule Take 1 capsule (10 mg total) by mouth in the morning and at bedtime. As needed 90 capsule 0   diltiazem (CARDIZEM CD) 120 MG 24 hr capsule Take 1 capsule (120 mg total) by mouth daily. 60 capsule 3   famotidine (PEPCID) 20 MG tablet Take 1 tablet (20 mg total) by mouth 2 (two) times daily. 60 tablet 3   levETIRAcetam (KEPPRA) 500 MG tablet Take 1 tablet (500 mg total) by mouth 2 (two) times daily. 60 tablet 2    ondansetron (ZOFRAN) 8 MG tablet Take 1 tablet (8 mg total) by mouth 2 (two) times daily as needed (nausea and vomiting). May take 30-60 minutes prior to Temodar administration if nausea/vomiting occurs. 30 tablet 1   temozolomide (TEMODAR) 100 MG capsule Take 1 capsule (100 mg total) by mouth daily. May take on an empty stomach to decrease nausea & vomiting. 42 capsule 0   No current facility-administered medications for this visit.     REVIEW OF SYSTEMS: On review of systems, the patient reports that she is doing really well. She does still have some right eye squinting but is feeling pretty well with her vision. She denies any headaches, seizures, changes in hearing, or movement. She has some nausea with strong smells. She was also seen by a dentist who recommends extracting an abscessed tooth. She's having this removed today, but is concerned about start date of treatment as she was advised to delay chemo 2 weeks. She also feels like her incision still has a retained staple. No other complaints are noted.      PHYSICAL EXAM:  Wt Readings from Last 3 Encounters:  09/24/21 120 lb 8 oz (54.7 kg)  09/15/21 125 lb (56.7 kg)  09/08/21 121 lb 7.6 oz (55.1 kg)   Temp Readings from Last 3 Encounters:  09/24/21 98.6 F (37 C)  09/19/21 98 F (36.7 C) (Oral)  09/10/21 98.5 F (36.9 C) (Oral)   BP Readings from Last 3 Encounters:  09/24/21 138/90  09/19/21 131/79  09/10/21 (!) 146/63   Pulse Readings from Last 3 Encounters:  09/24/21 64  09/19/21 (!) 57  09/10/21 (!) 58    /10  In general this is a well appearing hispanic female in no acute distress. She's alert and oriented x4 and appropriate throughout the examination. Cardiopulmonary assessment is negative for acute distress and she exhibits normal effort.  Her surgical site is well-healed without separation or drainage or erythema, and no visible staples are appreciated in her incision.    ECOG = 1  0 - Asymptomatic (Fully  active, able to carry on all predisease activities without restriction)  1 - Symptomatic but completely ambulatory (Restricted in physically strenuous activity but ambulatory and able to carry out work of a light or sedentary nature. For example, light housework, office work)  2 - Symptomatic, <50% in bed during the day (Ambulatory and capable of all self care but unable to carry out any work activities. Up and about more than 50% of waking hours)  3 - Symptomatic, >50% in bed, but not bedbound (Capable of only limited self-care, confined to bed or chair 50% or more of waking hours)  4 - Bedbound (Completely disabled. Cannot carry on any self-care. Totally confined to bed or chair)  5 -  Death   Eustace Pen MM, Creech RH, Tormey DC, et al. [phone]). "Toxicity and response criteria of the [facility] Group". [place] Oncol. 5 (6): 649-55    LABORATORY DATA:  Lab Results  Component Value Date   WBC 9.7 09/19/2021   HGB 11.8 (L) 09/19/2021   HCT 33.9 (L) 09/19/2021   MCV 87.6 09/19/2021   PLT 229 09/19/2021   Lab Results  Component Value Date   NA 136 09/19/2021   K 4.7 09/19/2021   CL 102 09/19/2021   CO2 26 09/19/2021   Lab Results  Component Value Date   ALT 17 09/08/2021   AST 16 09/08/2021   ALKPHOS 74 09/08/2021   BILITOT 0.4 09/08/2021      RADIOGRAPHY: CT HEAD WO CONTRAST (5MM)  Result Date: 09/15/2021 CLINICAL DATA:  Postop brain biopsy today. Mental status change. Brain mass. EXAM: CT HEAD WITHOUT CONTRAST TECHNIQUE: Contiguous axial images were obtained from the base of the skull through the vertex without intravenous contrast. RADIATION DOSE REDUCTION: This exam was performed according to the departmental dose-optimization program which includes automated exposure control, adjustment of the mA and/or kV according to patient size and/or use of iterative reconstruction technique. COMPARISON:  CT head 09/08/2021.  MRI head 09/08/2021. FINDINGS: Brain:  Postop right temporal craniotomy for tumor resection. There appears to have been debulking of the tumor which is difficult to see on unenhanced CT. Previously the tumor was hyperdense and centered in the medial temporal lobe with surrounding edema. There is improvement in mass-effect and midline shift which is now mild to the left. Interval development of subdural pneumocephalus anteriorly bilaterally. Interval development of right temporal subdural hematoma measuring approximately 11 mm in thickness. Small amount of subarachnoid hemorrhage in the sylvian fissures. Small amount of gas in the suprasellar cistern and sylvian fissures. Ventricle size remains normal. No acute infarct. Vascular: Negative for hyperdense vessel Skull: Right temporal craniotomy. Craniotomy flap is well position. No subcutaneous fluid collection. Sinuses/Orbits: Mild mucosal edema left maxillary sinus. Remaining sinuses clear. Negative orbit bilaterally. Other: None IMPRESSION: Postop tumor resection in the right temporal lobe. Postop pneumocephalus. Postop subdural hematoma right temporal lobe measuring 11 mm in thickness. Mild amount of subarachnoid hemorrhage. Improved mass-effect and midline shift compared with the preop study. Electronically Signed   By: Franchot Gallo M.D.   On: 09/15/2021 16:14   MR BRAIN WO CONTRAST  Result Date: 09/16/2021 CLINICAL DATA:  Postop tumor resection 09/15/2021. EXAM: MRI HEAD WITHOUT CONTRAST TECHNIQUE: Multiplanar, multiecho pulse sequences of the brain and surrounding structures were obtained without intravenous contrast. COMPARISON:  CT head 09/15/2021.  MRI head 09/09/2021 FINDINGS: Brain: Right temporal craniotomy for tumor debulking in the right temporal lobe. Large tumor is present which has been partially resected. There is hemorrhage within the posterior aspect of the tumor. Right temporal subdural fluid collection is similar to the CT yesterday. Decreased mass-effect compared with the  preoperative study. Minimal midline shift to the left has improved. Tumor extends into the prepontine cistern on the right with mass-effect on the right pons and midbrain. There is T2 hyperintensity in the midbrain and pons on the right which may be due to edema or tumor spread. This was present preoperatively Within the lateral aspect of the tumor, there is restricted diffusion compatible with acute infarct in the right temporal lobe laterally. Ventricle size remains normal. Vascular: Normal arterial flow voids at the skull base. Skull and upper cervical spine: Right temporal craniotomy. Sinuses/Orbits: Mild mucosal edema paranasal  sinuses. Mastoid clear bilaterally. Negative orbit Other: None IMPRESSION: 1. Postop debulking of large right temporal lobe tumor. There is decreased size of tumor and decreased mass-effect and midline shift. 2. Right temporal subdural hematoma as noted on CT yesterday. Mild amount of restricted diffusion in the right lateral temporal lobe compatible with ischemic change. Electronically Signed   By: Franchot Gallo M.D.   On: 09/16/2021 19:05   ECHOCARDIOGRAM COMPLETE  Result Date: 09/16/2021    ECHOCARDIOGRAM REPORT   Patient Name:   Patricia Stevenson Date of Exam: 09/16/2021 Medical Rec #:  [id number]    Height:       62.0 in Accession #:    [id number]   Weight:       125.0 lb Date of Birth:  March 31, 1961   BSA:          1.566 m Patient Age:    39 years     BP:           111/60 mmHg Patient Gender: F            HR:           75 bpm. Exam Location:  Inpatient Procedure: 2D Echo, Cardiac Doppler and Color Doppler Indications:    Afib  History:        Patient has no prior history of Echocardiogram examinations.  Sonographer:    Glo Herring Referring Phys: [id number] [place]  1. Left ventricular ejection fraction, by estimation, is 65 to 70%. The left ventricle has hyperdynamic function. The left ventricle has no regional wall motion abnormalities. There is mild left  ventricular hypertrophy. Left ventricular diastolic parameters are indeterminate.  2. Right ventricular systolic function is normal. The right ventricular size is normal. There is normal pulmonary artery systolic pressure. The estimated right ventricular systolic pressure is 74.8 mmHg.  3. The mitral valve is normal in structure. No evidence of mitral valve regurgitation. No evidence of mitral stenosis.  4. The aortic valve is tricuspid. Aortic valve regurgitation is not visualized. No aortic stenosis is present.  5. The inferior vena cava is normal in size with greater than 50% respiratory variability, suggesting right atrial pressure of 3 mmHg. FINDINGS  Left Ventricle: Left ventricular ejection fraction, by estimation, is 65 to 70%. The left ventricle has hyperdynamic function. The left ventricle has no regional wall motion abnormalities. The left ventricular internal cavity size was normal in size. There is mild left ventricular hypertrophy. Left ventricular diastolic parameters are indeterminate. Right Ventricle: The right ventricular size is normal. No increase in right ventricular wall thickness. Right ventricular systolic function is normal. There is normal pulmonary artery systolic pressure. The tricuspid regurgitant velocity is 2.28 m/s, and  with an assumed right atrial pressure of 3 mmHg, the estimated right ventricular systolic pressure is 27.0 mmHg. Left Atrium: Left atrial size was normal in size. Right Atrium: Right atrial size was normal in size. Pericardium: There is no evidence of pericardial effusion. Mitral Valve: The mitral valve is normal in structure. No evidence of mitral valve regurgitation. No evidence of mitral valve stenosis. Tricuspid Valve: The tricuspid valve is normal in structure. Tricuspid valve regurgitation is trivial. Aortic Valve: The aortic valve is tricuspid. Aortic valve regurgitation is not visualized. No aortic stenosis is present. Aortic valve mean gradient measures 3.0  mmHg. Aortic valve peak gradient measures 5.3 mmHg. Aortic valve area, by VTI measures 1.82 cm. Pulmonic Valve: The pulmonic valve was normal in structure. Pulmonic valve regurgitation is trivial.  Aorta: The aortic root is normal in size and structure. Venous: The inferior vena cava is normal in size with greater than 50% respiratory variability, suggesting right atrial pressure of 3 mmHg. IAS/Shunts: No atrial level shunt detected by color flow Doppler.  LEFT VENTRICLE PLAX 2D LVIDd:         3.40 cm LVIDs:         2.10 cm LV PW:         0.90 cm LV IVS:        1.40 cm LVOT diam:     1.60 cm LV SV:         37 LV SV Index:   24 LVOT Area:     2.01 cm  RIGHT VENTRICLE RV Basal diam:  2.90 cm LEFT ATRIUM         Index       RIGHT ATRIUM          Index LA diam:    2.10 cm 1.34 cm/m  RA Area:     9.25 cm                                 RA Volume:   13.90 ml 8.88 ml/m  AORTIC VALVE AV Area (Vmax):    1.73 cm AV Area (Vmean):   1.69 cm AV Area (VTI):     1.82 cm AV Vmax:           114.67 cm/s AV Vmean:          80.867 cm/s AV VTI:            0.203 m AV Peak Grad:      5.3 mmHg AV Mean Grad:      3.0 mmHg LVOT Vmax:         98.83 cm/s LVOT Vmean:        68.067 cm/s LVOT VTI:          0.184 m LVOT/AV VTI ratio: 0.90  AORTA Ao Root diam: 2.50 cm Ao Asc diam:  2.90 cm TRICUSPID VALVE TR Peak grad:   20.8 mmHg TR Vmax:        228.00 cm/s  SHUNTS Systemic VTI:  0.18 m Systemic Diam: 1.60 cm Dalton McleanMD Electronically signed by Franki Monte Signature Date/Time: 09/16/2021/4:56:30 PM    Final        IMPRESSION/PLAN: 1. 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 Lisbeth Renshaw discusses the pathology findings and reviews the nature of primary brain disease.  We discussed the risks, benefits, short, and long term effects of radiotherapy, as well as the curative intent, and the patient is interested in proceeding. Dr. Lisbeth Renshaw discusses the delivery and logistics of radiotherapy and anticipates a course of 6 weeks of  radiotherapy to the surgical cavity. Written consent is obtained and placed in the chart, a copy was provided to the patient. She will simulate today with IV contrast. We anticipate that she would start treatment the week of 10/26/2021 but will reach out to Dr. Mickeal Skinner about the concern with her dental issue.  In a visit lasting 60 minutes, greater than 50% of the time was spent face to face discussing the patient's condition, in preparation for the discussion, and coordinating the patient's care.   The above documentation reflects my direct findings during this shared patient visit. Please see the separate note by Dr. Lisbeth Renshaw on this date for the remainder of the patient's plan of  care.    Carola Rhine, [facility]   **Disclaimer: This note was dictated with voice recognition software. Similar sounding words can inadvertently be transcribed and this note may contain transcription errors which may not have been corrected upon publication of note.**

## 2021-10-14 ENCOUNTER — Telehealth: Payer: Self-pay

## 2021-10-14 ENCOUNTER — Ambulatory Visit: Payer: 59 | Admitting: Radiation Oncology

## 2021-10-14 NOTE — Progress Notes (Signed)
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  MRI Brain 09/16/2021: Postop debulking of large right temporal lobe tumor. There is decreased size of tumor and decreased mass-effect and midline shift.  Right temporal subdural hematoma as noted on CT yesterday. Mild amount of restricted diffusion in the right lateral temporal lobe compatible with ischemic change.  MRI Brain 09/08/2021: Large infiltrating mass centered in the right medial temporal lobe.  No associated enhancement or hemorrhage. Favor high-grade glioma such as glioblastoma.   Past or anticipated interventions, if any, per neurosurgery:  Dr. Marcello Moores -Right Craniotomy Temporal Lobectomy 09/15/2021     Past or anticipated interventions, if any, per medical oncology:  Dr. Mickeal Skinner 10/08/2021 -We ultimately recommended proceeding with course of intensity modulated radiation therapy and concurrent daily Temozolomide.  Radiation will be administered Mon-Fri over 6 weeks, Temodar will be dosed at 75mg /m2 to be given daily over 42 days.      Dose of Decadron, if applicable: n/a  Recent neurologic symptoms, if any:  Seizures: No Headaches: No Nausea: Strong smells make her nauseous. Dizziness/ataxia: No Difficulty with hand coordination: No Focal numbness/weakness: No Visual deficits/changes: No Confusion/Memory deficits: No    SAFETY ISSUES: Prior radiation? No Pacemaker/ICD? No Possible current pregnancy? Postmenopausal Is the patient on methotrexate? No  Additional Complaints / other details:

## 2021-10-14 NOTE — Telephone Encounter (Signed)
Oral Chemotherapy Pharmacist Encounter   Called to check on order status of Temozolomide through [facility]. Was informed that shipment has already been set up for patient, and this will deliver to her home on 10/19/21. Patient had $0 copay for first fill of temozolomide.  Will touch base with patient and family for initial counseling once start date of radiation is determined.   Leron Croak, PharmD, BCPS Hematology/Oncology Clinical Pharmacist [facility] [place] [phone] 10/14/2021 9:03 AM

## 2021-10-14 NOTE — Telephone Encounter (Signed)
Patient's daughter called to inform us that the patient has a tooth infection according to the dentist. The dentist wants to abstract the tooth, but doesn't want the patient to start chemo or radiation until the infection has cleared up. I informed the daughter that I will let Dr.Vaslow and Dr. Lisbeth Renshaw know about this. She verbalized understanding and had no further questions or concerns.

## 2021-10-14 NOTE — Progress Notes (Incomplete)
Has armband been applied?    Does patient have an allergy to IV contrast dye?: n/a   Has patient ever received premedication for IV contrast dye?: n/a   Does patient take metformin?: No  If patient does take metformin when was the last dose: n/a  Date of lab work: 09/19/2021 BUN: 20 CR: 0.58 eGfr: >60  IV site:   Has IV site been added to flowsheet?

## 2021-10-15 ENCOUNTER — Ambulatory Visit: Payer: 59

## 2021-10-15 ENCOUNTER — Ambulatory Visit
Admission: RE | Admit: 2021-10-15 | Discharge: 2021-10-15 | Disposition: A | Discharge status: Home / Self Care | Payer: 59 | Source: Ambulatory Visit | Attending: Radiation Oncology | Admitting: Radiation Oncology

## 2021-10-15 ENCOUNTER — Other Ambulatory Visit: Payer: Self-pay

## 2021-10-15 ENCOUNTER — Other Ambulatory Visit: Payer: Self-pay | Admitting: Radiation Oncology

## 2021-10-15 ENCOUNTER — Ambulatory Visit: Payer: 59 | Admitting: Radiation Oncology

## 2021-10-15 ENCOUNTER — Encounter: Payer: Self-pay | Admitting: Radiation Oncology

## 2021-10-15 VITALS — BP 124/59 | HR 74 | Temp 97.6°F | Resp 18 | Ht 60.0 in | Wt 120.0 lb

## 2021-10-15 DIAGNOSIS — I517 Cardiomegaly: Secondary | ICD-10-CM | POA: Diagnosis not present

## 2021-10-15 DIAGNOSIS — R600 Localized edema: Secondary | ICD-10-CM | POA: Insufficient documentation

## 2021-10-15 DIAGNOSIS — E559 Vitamin D deficiency, unspecified: Secondary | ICD-10-CM | POA: Insufficient documentation

## 2021-10-15 DIAGNOSIS — C712 Malignant neoplasm of temporal lobe: Secondary | ICD-10-CM

## 2021-10-15 DIAGNOSIS — Z79899 Other long term (current) drug therapy: Secondary | ICD-10-CM | POA: Insufficient documentation

## 2021-10-15 DIAGNOSIS — K219 Gastro-esophageal reflux disease without esophagitis: Secondary | ICD-10-CM | POA: Insufficient documentation

## 2021-10-15 DIAGNOSIS — C719 Malignant neoplasm of brain, unspecified: Secondary | ICD-10-CM | POA: Diagnosis not present

## 2021-10-15 DIAGNOSIS — E785 Hyperlipidemia, unspecified: Secondary | ICD-10-CM | POA: Diagnosis not present

## 2021-10-15 DIAGNOSIS — Z803 Family history of malignant neoplasm of breast: Secondary | ICD-10-CM | POA: Diagnosis not present

## 2021-10-15 NOTE — Progress Notes (Signed)
Addendum: Dr. Mickeal Skinner is in agreement to delay start date due to dental issue, so start date will be 11/02/21. We will delay her simulation to next week.     Carola Rhine, PAC

## 2021-10-15 NOTE — Addendum Note (Signed)
Encounter addended by: Hayden Pedro, PA-C on: 10/15/2021 9:46 AM  Actions taken: Clinical Note Signed

## 2021-10-19 ENCOUNTER — Other Ambulatory Visit: Payer: Self-pay

## 2021-10-19 ENCOUNTER — Ambulatory Visit (INDEPENDENT_AMBULATORY_CARE_PROVIDER_SITE_OTHER): Payer: 59 | Admitting: Internal Medicine

## 2021-10-19 VITALS — BP 122/78 | HR 63 | Ht 62.0 in | Wt 121.8 lb

## 2021-10-19 DIAGNOSIS — I480 Paroxysmal atrial fibrillation: Secondary | ICD-10-CM | POA: Diagnosis not present

## 2021-10-19 NOTE — Patient Instructions (Signed)
Medication Instructions:  No Changes In Medications at this time.   *If you need a refill on your cardiac medications before your next appointment, please call your pharmacy*  Follow-Up: At [facility], you and your health needs are our priority.  As part of our continuing mission to provide you with exceptional heart care, we have created designated Provider Care Teams.  These Care Teams include your primary Cardiologist (physician) and Advanced Practice Providers (APPs -  Physician Assistants and Nurse Practitioners) who all work together to provide you with the care you need, when you need it.  Your next appointment:   3 month(s)  The format for your next appointment:   In Person  Provider:   Janina Mayo, MD

## 2021-10-19 NOTE — Progress Notes (Signed)
Cardiology Office Note:    Date:  10/19/2021   ID:  Patricia Stevenson, DOB 1961-04-20, MRN [id number]  PCP:  Patricia Stevenson, Rayford Halsted, MD   [facility] Providers Cardiologist:  None     Referring MD: Patricia Stevenson, Patricia Stevenson*   No chief complaint on file. Paroxysmal Atrial Fibrillation  History of Present Illness:    Patricia Stevenson is a 61 y.o. female with a hx of HLD, vitamin D deficiency, GERD who is being seen in the hospital 09/17/2021 for the evaluation of atrial fibrillation.   She was admitted to the hospital at this time. She presented with right sided weakness for several weeks. She had an outpatient head CT which demonstrated a hyperdense mass in the right midbrain and thalamus measuring up to 3.4 cm. MRI showed the mass infiltrating most of the right temporal lobe and extending into the insula with extension into the right lateral basal ganglia and a 5 mm midline shift to the left.  She was treated with IV decadron and Keppra. On 09/15/21 she underwent right craniotomy with tumor resection. She Pathology demonstrated high grade glioma not classfiable. While in PACU she was noted to have mental status change and was not following commands. She went for stat CT head with revealed postop pneumocephalus, subdural hematoma right temporal lobe, and mild amount of subarachnoid hemorrhage. During the procedure she was noted to have transient afib with RVR and started on IV diltiazem with conversion to sinus rhythm around 1 AM the next day. She had an echo that was normal.   I saw her in the hospital. She has a  chads2vasc =1 due to gender.Her annual risk of stroke 0.6%. Considering her lower risk of stroke, she is not recommended for anticoagulation. Started her on oral diltiazem.  She notes her heart going fast when she exercises. She feels shortness of breath , then continues to walk. No chest pressure. No fainting or syncope. Her blood pressures are well controlled.  Her oncologist is Dr.  Mickeal Skinner who recommended chemo and radiation. She is planned for concurrent daily Temozolomide (alkylating agent) and XRT.  Saw radiation oncology planned for 6 weeks of radiotherapy. Her daughter noted therapy starts March 13th.   Cardiology Studies  TTE 09/16/2021 Normal LV function, normal RV. No valve disease. No pulmonary htn.    Past Medical History:  Diagnosis Date   GERD (gastroesophageal reflux disease)    Hyperlipidemia    Vitamin D deficiency     Past Surgical History:  Procedure Laterality Date   APPLICATION OF CRANIAL NAVIGATION Right 09/15/2021   Procedure: APPLICATION OF CRANIAL NAVIGATION;  Surgeon: Patricia Mare, MD;  Location: [place];  Service: Neurosurgery;  Laterality: Right;   BREAST BIOPSY Left    20 years ago in [place]- benign- not sure if bx or cyst   CHOLECYSTECTOMY     Laparoscopic cholecystectomy 04/29/2021   CRANIOTOMY Right 09/15/2021   Procedure: Right Craniotomy Temporal Lobectomy with Brain Lab;  Surgeon: Patricia Mare, MD;  Location: [place];  Service: Neurosurgery;  Laterality: Right;    Current Medications: No outpatient medications have been marked as taking for the 10/19/21 encounter (Appointment) with Janina Mayo, MD.     Allergies:   Latex   Social History   Socioeconomic History   Marital status: Married    Spouse name: Not on file   Number of children: Not on file   Years of education: Not on file   Highest education level: Not  on file  Occupational History   Not on file  Tobacco Use   Smoking status: Never   Smokeless tobacco: Never  Vaping Use   Vaping Use: Not on file  Substance and Sexual Activity   Alcohol use: No   Drug use: No   Sexual activity: Not on file  Other Topics Concern   Not on file  Social History Narrative   Not on file   Social Determinants of Health   Financial Resource Strain: Not on file  Food Insecurity: Not on file  Transportation Needs: Not on file  Physical Activity: Not on file   Stress: Not on file  Social Connections: Not on file     Family History: The patient's family history is negative for Breast cancer and CAD.  ROS:   Please see the history of present illness.     All other systems reviewed and are negative.  EKGs/Labs/Other Studies Reviewed:    The following studies were reviewed today:   EKG:  EKG is  ordered today.  The ekg ordered today demonstrates   NSR, Qtc 384 ms  Recent Labs: 09/08/2021: ALT 17 09/17/2021: TSH 0.224 09/19/2021: BUN 20; Creatinine, Ser 0.58; Hemoglobin 11.8; Magnesium 2.1; Platelets 229; Potassium 4.7; Sodium 136  Recent Lipid Panel    Component Value Date/Time   CHOL 176 11/20/2020 0756   TRIG 98.0 11/20/2020 0756   HDL 41.10 11/20/2020 0756   CHOLHDL 4 11/20/2020 0756   VLDL 19.6 11/20/2020 0756   LDLCALC 115 (H) 11/20/2020 0756   LDLCALC 151 (H) 05/21/2020 0811     Risk Assessment/Calculations:    CHA2DS2-VASc Score = 1   This indicates a 0.6% annual risk of stroke. The patient's score is based upon: CHF History: 0 HTN History: 0 Diabetes History: 0 Stroke History: 0 Vascular Disease History: 0 Age Score: 0 Gender Score: 1           Physical Exam:    VS:    Vitals:   10/19/21 0941  BP: 122/78  Pulse: 63  SpO2: 99%     Wt Readings from Last 3 Encounters:  10/15/21 120 lb (54.4 kg)  09/24/21 120 lb 8 oz (54.7 kg)  09/15/21 125 lb (56.7 kg)     GEN:  Well nourished, well developed in no acute distress HEENT:Large healed scar post craniotomy scar on the R NECK: No JVD; No carotid bruits LYMPHATICS: No lymphadenopathy CARDIAC: RRR, no murmurs, rubs, gallops RESPIRATORY:  Clear to auscultation without rales, wheezing or rhonchi  ABDOMEN: Soft, non-tender, non-distended MUSCULOSKELETAL:  No edema; No deformity  SKIN: Warm and dry NEUROLOGIC:  Alert and oriented x 3 PSYCHIATRIC:  Normal affect   ASSESSMENT:    #Paroxysmal Atrial Fibrillations: Chads2vasc =1. No indication for  anticoagulation. Will continue rate control with cardizem 120 mg. She is tolerating this well. She's in sinus rhythm. If she were to develop persistent afib and require DCCV, AC would be recommended in this case. If this happens, can discuss with her oncologist and consider repeat CT head  to help determine risk of ICH. Last noted mild subarachnoid hemorrhage  #CardioOnc- no significant data to support risk of cardiovascular disease toxicity with Temozolomide. There are no drug-drug interactions with temozolomide and cardizem, however if she starts a different therapy with an interaction, Her diltiazem can be changed to metoprolol 25 mg XL daily.   PLAN:    In order of problems listed above:  No changes Follow up in 3 months  Medication Adjustments/Labs and Tests Ordered: Current medicines are reviewed at length with the patient today.  Concerns regarding medicines are outlined above.  No orders of the defined types were placed in this encounter.  No orders of the defined types were placed in this encounter.   There are no Patient Instructions on file for this visit.   Signed, Janina Mayo, MD  10/19/2021 8:05 AM    [place]

## 2021-10-21 DIAGNOSIS — Z510 Encounter for antineoplastic radiation therapy: Secondary | ICD-10-CM | POA: Insufficient documentation

## 2021-10-21 DIAGNOSIS — C719 Malignant neoplasm of brain, unspecified: Secondary | ICD-10-CM | POA: Insufficient documentation

## 2021-10-23 ENCOUNTER — Ambulatory Visit
Admission: RE | Admit: 2021-10-23 | Discharge: 2021-10-23 | Disposition: A | Discharge status: Home / Self Care | Payer: 59 | Source: Ambulatory Visit | Attending: Radiation Oncology | Admitting: Radiation Oncology

## 2021-10-23 ENCOUNTER — Other Ambulatory Visit: Payer: Self-pay

## 2021-10-23 DIAGNOSIS — C719 Malignant neoplasm of brain, unspecified: Secondary | ICD-10-CM

## 2021-10-23 DIAGNOSIS — C712 Malignant neoplasm of temporal lobe: Secondary | ICD-10-CM | POA: Insufficient documentation

## 2021-10-23 DIAGNOSIS — Z510 Encounter for antineoplastic radiation therapy: Secondary | ICD-10-CM | POA: Insufficient documentation

## 2021-10-23 LAB — CMP (CANCER CENTER ONLY)
ALT: 26 U/L (ref 0–44)
AST: 14 U/L — ABNORMAL LOW (ref 15–41)
Albumin: 4.1 g/dL (ref 3.5–5.0)
Alkaline Phosphatase: 92 U/L (ref 38–126)
Anion gap: 7 (ref 5–15)
BUN: 13 mg/dL (ref 6–20)
CO2: 28 mmol/L (ref 22–32)
Calcium: 9.3 mg/dL (ref 8.9–10.3)
Chloride: 104 mmol/L (ref 98–111)
Creatinine: 0.51 mg/dL (ref 0.44–1.00)
GFR, Estimated: >60 mL/min (ref >60)
Glucose, Bld: 93 mg/dL (ref 70–99)
Potassium: 3.9 mmol/L (ref 3.5–5.1)
Sodium: 139 mmol/L (ref 135–145)
Total Bilirubin: 0.3 mg/dL (ref 0.3–1.2)
Total Protein: 7.1 g/dL (ref 6.5–8.1)

## 2021-10-23 LAB — CBC WITH DIFFERENTIAL (CANCER CENTER ONLY)
Abs Immature Granulocytes: 0.03 10*3/uL (ref 0.00–0.07)
Basophils Absolute: 0.1 10*3/uL (ref 0.0–0.1)
Basophils Relative: 1 %
Eosinophils Absolute: 0 10*3/uL (ref 0.0–0.5)
Eosinophils Relative: 0 %
HCT: 40.6 % (ref 36.0–46.0)
Hemoglobin: 13.1 g/dL (ref 12.0–15.0)
Immature Granulocytes: 1 %
Lymphocytes Relative: 27 %
Lymphs Abs: 1.7 10*3/uL (ref 0.7–4.0)
MCH: 29.7 pg (ref 26.0–34.0)
MCHC: 32.3 g/dL (ref 30.0–36.0)
MCV: 92.1 fL (ref 80.0–100.0)
Monocytes Absolute: 0.4 10*3/uL (ref 0.1–1.0)
Monocytes Relative: 6 %
Neutro Abs: 4.1 10*3/uL (ref 1.7–7.7)
Neutrophils Relative %: 65 %
Platelet Count: 346 10*3/uL (ref 150–400)
RBC: 4.41 MIL/uL (ref 3.87–5.11)
RDW: 14.6 % (ref 11.5–15.5)
WBC Count: 6.2 10*3/uL (ref 4.0–10.5)
nRBC: 0 % (ref 0.0–0.2)

## 2021-10-23 MED ORDER — SODIUM CHLORIDE 0.9% FLUSH
10.0000 mL | Freq: Once | INTRAVENOUS | Status: AC
  Administered 2021-10-23: 10 mL via INTRAVENOUS

## 2021-10-23 NOTE — Progress Notes (Signed)
Has armband been applied?  Yes ? ?Does patient have an allergy to IV contrast dye?: No ?  ?Has patient ever received premedication for IV contrast dye?: n/a ? ?Does patient take metformin?: No ? ?If patient does take metformin when was the last dose: n/a ? ?Date of lab work: 10/23/2021 ?BUN: 13 ?CR: 0.51 ?eGfr: >60 ? ?IV site: Left AC ? ?Has IV site been added to flowsheet?  Yes ? ? ?

## 2021-10-27 ENCOUNTER — Observation Stay (HOSPITAL_COMMUNITY)
Admission: EM | Admit: 2021-10-27 | Discharge: 2021-10-28 | Disposition: A | Discharge status: Home / Self Care | Payer: 59 | Attending: Internal Medicine | Admitting: Internal Medicine

## 2021-10-27 ENCOUNTER — Other Ambulatory Visit: Payer: Self-pay

## 2021-10-27 ENCOUNTER — Emergency Department (HOSPITAL_COMMUNITY): Payer: 59

## 2021-10-27 ENCOUNTER — Encounter (HOSPITAL_COMMUNITY): Payer: Self-pay

## 2021-10-27 ENCOUNTER — Telehealth: Payer: Self-pay | Admitting: *Deleted

## 2021-10-27 DIAGNOSIS — Z79899 Other long term (current) drug therapy: Secondary | ICD-10-CM

## 2021-10-27 DIAGNOSIS — Z9889 Other specified postprocedural states: Secondary | ICD-10-CM | POA: Diagnosis not present

## 2021-10-27 DIAGNOSIS — C719 Malignant neoplasm of brain, unspecified: Secondary | ICD-10-CM | POA: Insufficient documentation

## 2021-10-27 DIAGNOSIS — R531 Weakness: Secondary | ICD-10-CM | POA: Diagnosis not present

## 2021-10-27 DIAGNOSIS — E785 Hyperlipidemia, unspecified: Secondary | ICD-10-CM | POA: Diagnosis present

## 2021-10-27 DIAGNOSIS — Z9049 Acquired absence of other specified parts of digestive tract: Secondary | ICD-10-CM

## 2021-10-27 DIAGNOSIS — Z20822 Contact with and (suspected) exposure to covid-19: Secondary | ICD-10-CM | POA: Diagnosis present

## 2021-10-27 DIAGNOSIS — Z91040 Latex allergy status: Secondary | ICD-10-CM | POA: Diagnosis not present

## 2021-10-27 DIAGNOSIS — I480 Paroxysmal atrial fibrillation: Secondary | ICD-10-CM | POA: Diagnosis present

## 2021-10-27 DIAGNOSIS — G8194 Hemiplegia, unspecified affecting left nondominant side: Secondary | ICD-10-CM | POA: Diagnosis present

## 2021-10-27 DIAGNOSIS — D496 Neoplasm of unspecified behavior of brain: Secondary | ICD-10-CM | POA: Diagnosis not present

## 2021-10-27 DIAGNOSIS — K219 Gastro-esophageal reflux disease without esophagitis: Secondary | ICD-10-CM | POA: Diagnosis present

## 2021-10-27 DIAGNOSIS — R29810 Facial weakness: Secondary | ICD-10-CM | POA: Diagnosis present

## 2021-10-27 DIAGNOSIS — E559 Vitamin D deficiency, unspecified: Secondary | ICD-10-CM | POA: Diagnosis present

## 2021-10-27 DIAGNOSIS — Z85841 Personal history of malignant neoplasm of brain: Secondary | ICD-10-CM | POA: Diagnosis not present

## 2021-10-27 LAB — CBC WITH DIFFERENTIAL/PLATELET
Abs Immature Granulocytes: 0.03 10*3/uL (ref 0.00–0.07)
Basophils Absolute: 0.1 10*3/uL (ref 0.0–0.1)
Basophils Relative: 1 %
Eosinophils Absolute: 0 10*3/uL (ref 0.0–0.5)
Eosinophils Relative: 0 %
HCT: 42.6 % (ref 36.0–46.0)
Hemoglobin: 14.1 g/dL (ref 12.0–15.0)
Immature Granulocytes: 0 %
Lymphocytes Relative: 33 %
Lymphs Abs: 2.3 10*3/uL (ref 0.7–4.0)
MCH: 30.9 pg (ref 26.0–34.0)
MCHC: 33.1 g/dL (ref 30.0–36.0)
MCV: 93.4 fL (ref 80.0–100.0)
Monocytes Absolute: 0.5 10*3/uL (ref 0.1–1.0)
Monocytes Relative: 7 %
Neutro Abs: 4.1 10*3/uL (ref 1.7–7.7)
Neutrophils Relative %: 59 %
Platelets: 311 10*3/uL (ref 150–400)
RBC: 4.56 MIL/uL (ref 3.87–5.11)
RDW: 14.3 % (ref 11.5–15.5)
WBC: 7 10*3/uL (ref 4.0–10.5)
nRBC: 0 % (ref 0.0–0.2)

## 2021-10-27 LAB — BASIC METABOLIC PANEL
Anion gap: 13 (ref 5–15)
BUN: 17 mg/dL (ref 6–20)
CO2: 21 mmol/L — ABNORMAL LOW (ref 22–32)
Calcium: 9.1 mg/dL (ref 8.9–10.3)
Chloride: 104 mmol/L (ref 98–111)
Creatinine, Ser: 0.56 mg/dL (ref 0.44–1.00)
GFR, Estimated: >60 mL/min (ref >60)
Glucose, Bld: 86 mg/dL (ref 70–99)
Potassium: 3.9 mmol/L (ref 3.5–5.1)
Sodium: 138 mmol/L (ref 135–145)

## 2021-10-27 IMAGING — MR MR HEAD WO/W CM
14 of 16 series · 40 of 48 positions shown · IV contrast (gadavist)
Comparison: CT from earlier the same day as well as previous
examinations.

CLINICAL DATA: 60-year-old female with history of high-grade
glioma, status post subtotal resection, left-sided weakness.

EXAM:
MRI HEAD WITHOUT AND WITH CONTRAST
TECHNIQUE: Multiplanar, multiecho pulse sequences of the brain and surrounding
structures were obtained without and with intravenous contrast.
CONTRAST:  5.5mL GADAVIST GADOBUTROL 1 MMOL/ML IV SOLN

[Series 5: DWI · axial · 3.0mm · 0.88mm/px · z∈[-101,+44]mm · 5 of 100 slices shown (1 of 4)]
[im 1/100]
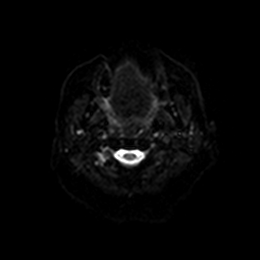
[im 25/100]
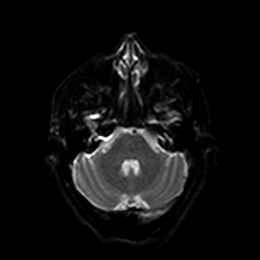
[im 50/100]
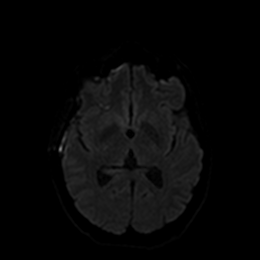
[im 75/100]
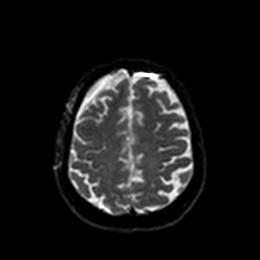
[im 100/100]
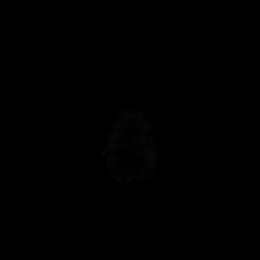

[Series 6: DWI · axial · 3.0mm · 0.88mm/px · z∈[-101,+44]mm · 3 of 49 slices shown (2 of 4)]
[im 1/49]
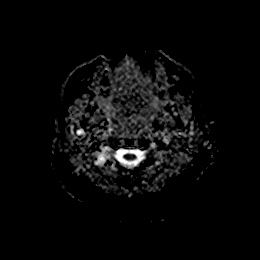
[im 25/49]
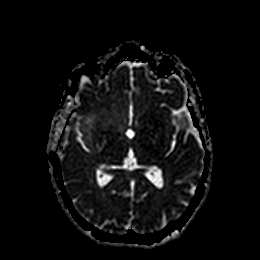
[im 49/49]
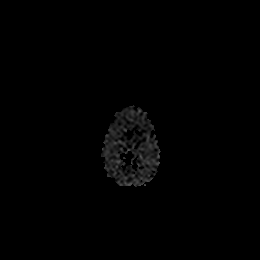

[Series 7: DWI · coronal · 4.0mm · 0.88mm/px · 4 of 68 slices shown (3 of 4)]
[im 1/68]
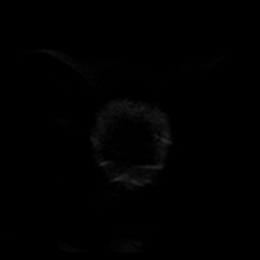
[im 23/68]
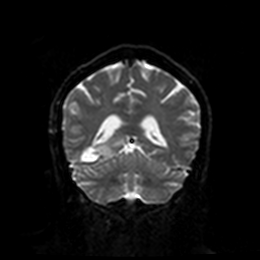
[im 45/68]
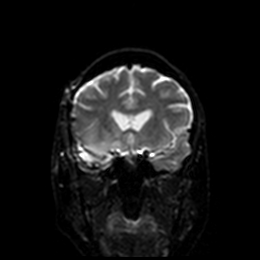
[im 68/68]
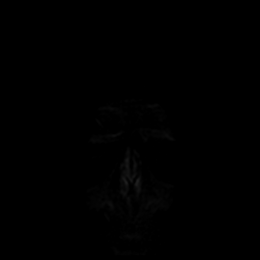

[Series 8: DWI · coronal · 4.0mm · 0.88mm/px · 2 of 34 slices shown (4 of 4)]
[im 1/34]
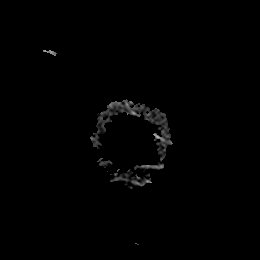
[im 34/34]
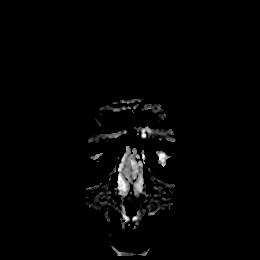

[Series 9: T1 · sagittal · 5.0mm · 0.75mm/px · 2 of 24 slices shown]
[im 1/24]
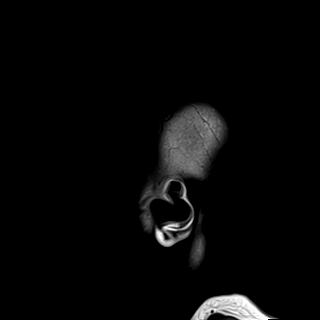
[im 24/24]
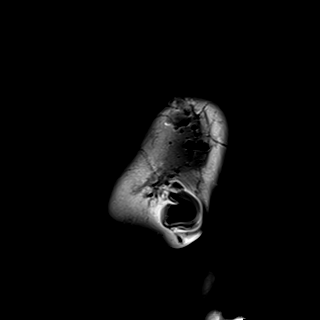

[Series 10: T2 · axial · 5.0mm · 0.72mm/px · z∈[-103,+46]mm · 2 of 26 slices shown]
[im 1/26]
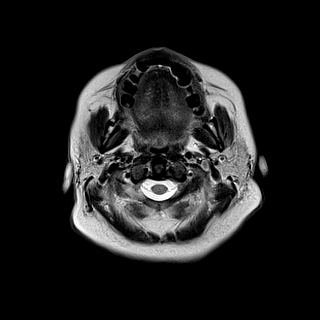
[im 26/26]
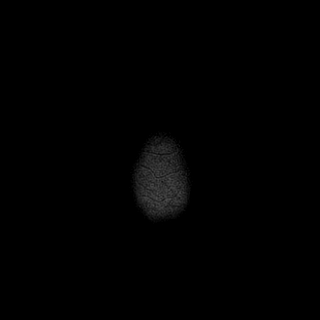

[Series 11: FLAIR · axial · 5.0mm · 0.45mm/px · z∈[-104,+44]mm · 2 of 26 slices shown]
[im 1/26]
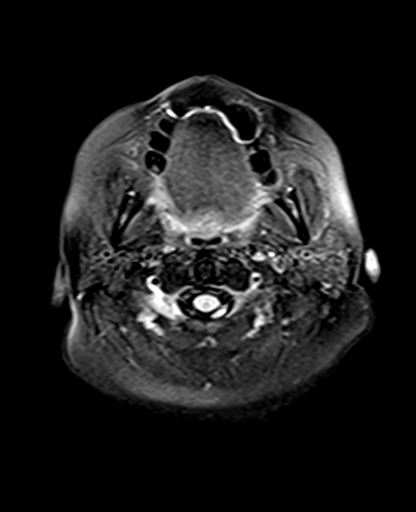
[im 26/26]
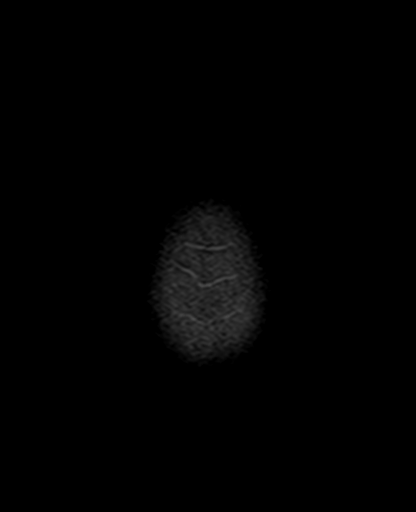

[Series 12: mag_images · axial · 3.0mm · 0.90mm/px · z∈[-118,+57]mm · 4 of 60 slices shown]
[im 1/60]
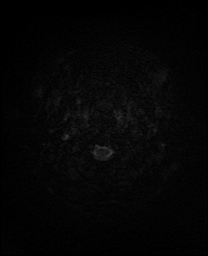
[im 20/60]
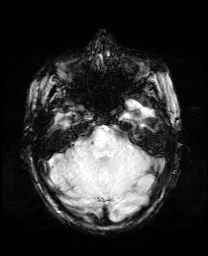
[im 40/60]
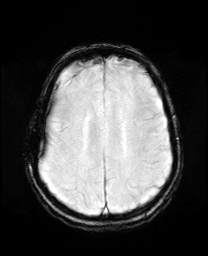
[im 60/60]
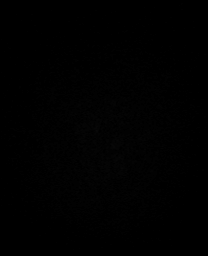

[Series 13: pha_images · axial · 3.0mm · 0.90mm/px · z∈[-118,+51]mm · 3 of 55 slices shown]
[im 1/55]
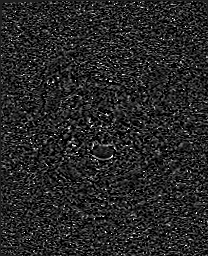
[im 28/55]
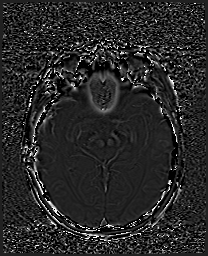
[im 55/55]
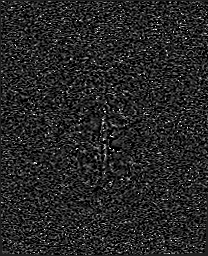

[Series 14: swi_images · axial · 3.0mm · 0.90mm/px · z∈[-118,+57]mm · 4 of 60 slices shown]
[im 1/60]
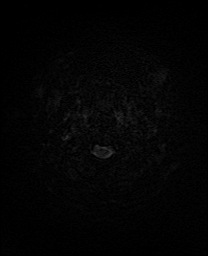
[im 20/60]
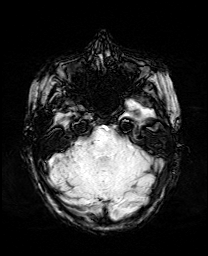
[im 40/60]
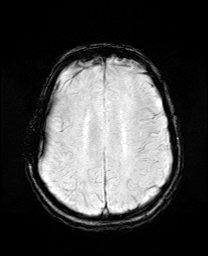
[im 60/60]
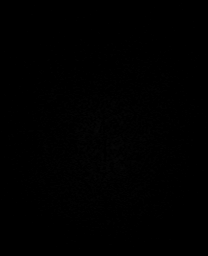

[Series 15: mip_images(sw) · axial · 24.0mm · 0.90mm/px · z∈[-107,+47]mm · 3 of 53 slices shown]
[im 1/53]
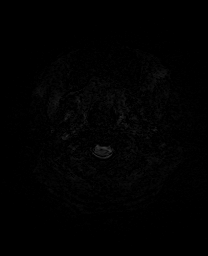
[im 27/53]
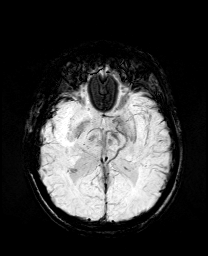
[im 53/53]
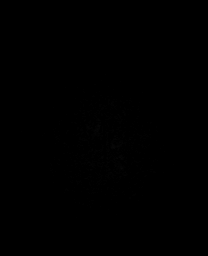

[Series 17: T2 post-contrast · coronal · 5.0mm · 0.72mm/px · 2 of 29 slices shown]
[im 1/29]
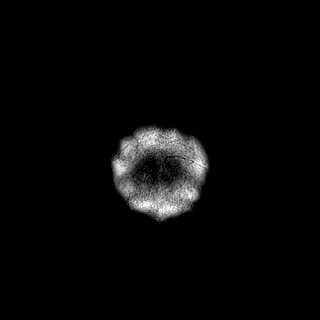
[im 29/29]
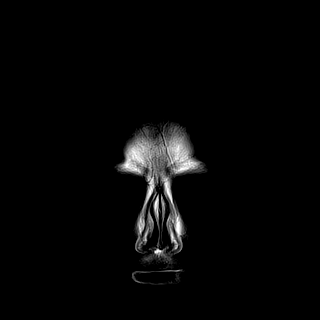

[Series 19: T1 post-contrast · coronal · 5.0mm · 0.34mm/px · 2 of 29 slices shown (1 of 2)]
[im 1/29]
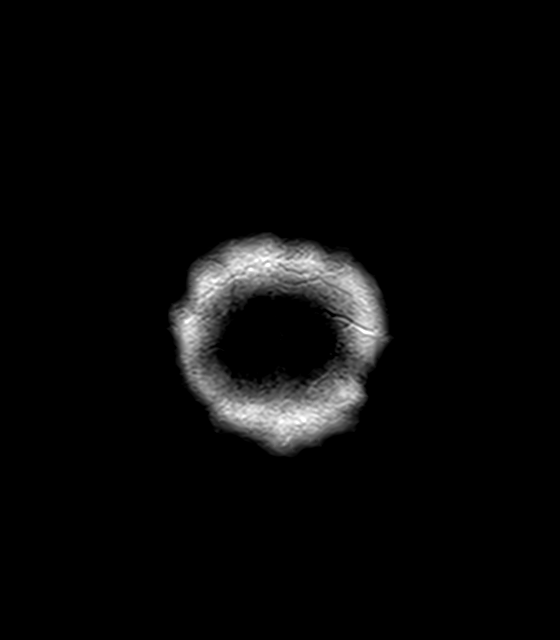
[im 29/29]
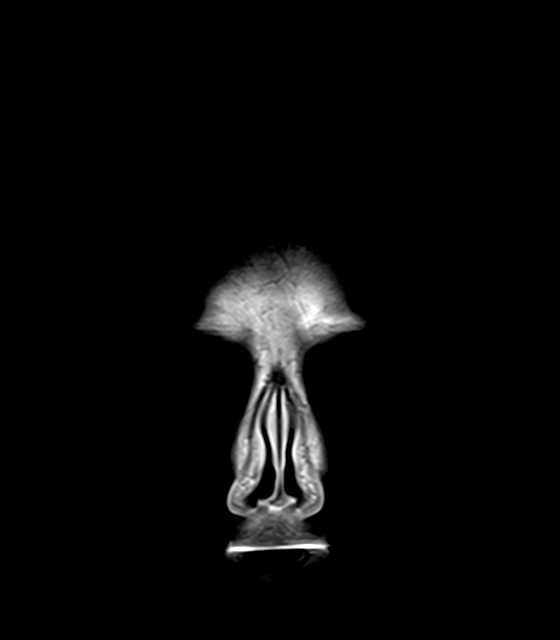

[Series 20: T1 post-contrast · sagittal · 5.0mm · 0.72mm/px · 2 of 24 slices shown (2 of 2)]
[im 1/24]
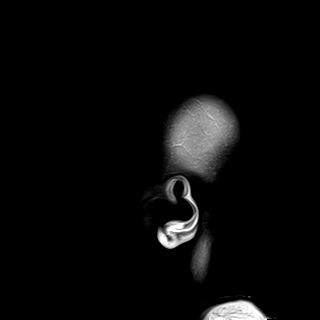
[im 24/24]
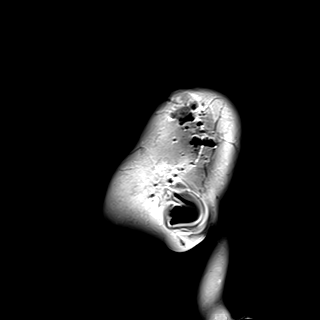

[40 of 48 positions shown; findings below may reference images not displayed]

FINDINGS: Brain: Postoperative changes from prior right pterional craniotomy
for debulking of large infiltrating tumor centered at the right
frontotemporal region again seen. Small residual extra-axial
postoperative collection overlies the right frontotemporal
convexity, measuring up to 5 mm in maximal thickness. No significant
mass effect or midline shift. Overlying postoperative dural
thickening and enhancement within this region. Scattered chronic
blood products seen within the resection cavity. Residual
infiltrative tumor involving the inferior right frontal lobe and
right temporal lobe, with extension into the right cerebral
peduncle, midbrain, and pons again seen. Extension of tumor into the
prepontine cistern again noted. Additional tumor involvement at the
right quadrigeminal plate cistern as well, similar. While the
overall size and extent of the tumor is similar, there is
progressive patchy and ring enhancement at the right cerebral
peduncle/midbrain (series 18, image 26, 24, 22), suggesting disease
progression. No significant midline shift. No hydrocephalus or
ventricular trapping.

No evidence for acute infarct elsewhere within the brain. Gray-white
matter differentiation otherwise maintained. No other mass lesion or
mass effect. No other abnormal enhancement.

Vascular: Major intracranial vascular flow voids are maintained.

Skull and upper cervical spine: Craniocervical junction within
normal limits. Bone marrow signal intensity normal. Postoperative
changes from prior right pterional craniotomy without adverse
features.

Sinuses/Orbits: Globes orbital soft tissues within normal limits.
Paranasal sinuses are largely clear. No mastoid effusion.

Other: None.
IMPRESSION: 1. Postoperative changes from prior right pterional craniotomy for
debulking of large infiltrative tumor centered at the right
frontotemporal region. While the overall size and extent of the
tumor is similar to previous, there is progressive patchy and ring
enhancement at the right cerebral peduncle/midbrain, suggesting
disease progression.
2. Small residual right-sided postoperative subdural collection
without significant mass effect.
3. No other acute intracranial abnormality.

## 2021-10-27 MED ORDER — ACETAMINOPHEN 325 MG PO TABS
650.0000 mg | ORAL_TABLET | Freq: Once | ORAL | Status: AC
Start: 2021-10-27 — End: 2021-10-27
  Administered 2021-10-27: 650 mg via ORAL
  Filled 2021-10-27: qty 2

## 2021-10-27 MED ORDER — SODIUM CHLORIDE 0.9 % IV SOLN: 250.0000 mL | INTRAVENOUS | Status: DC | PRN

## 2021-10-27 MED ORDER — DEXAMETHASONE SODIUM PHOSPHATE 4 MG/ML IJ SOLN
4.0000 mg | Freq: Four times a day (QID) | INTRAMUSCULAR | Status: DC
Start: 2021-10-28 — End: 2021-10-28
  Administered 2021-10-28 (×3): 4 mg via INTRAVENOUS
  Filled 2021-10-27 (×3): qty 1

## 2021-10-27 MED ORDER — GADOBUTROL 1 MMOL/ML IV SOLN
5.5000 mL | Freq: Once | INTRAVENOUS | Status: AC | PRN
  Administered 2021-10-27: 5.5 mL via INTRAVENOUS

## 2021-10-27 MED ORDER — SODIUM CHLORIDE 0.9% FLUSH: 3.0000 mL | INTRAVENOUS | Status: DC | PRN

## 2021-10-27 MED ORDER — SODIUM CHLORIDE 0.9% FLUSH
3.0000 mL | Freq: Two times a day (BID) | INTRAVENOUS | Status: DC
  Administered 2021-10-27 – 2021-10-28 (×2): 3 mL via INTRAVENOUS

## 2021-10-27 MED ORDER — HYDROCODONE-ACETAMINOPHEN 5-325 MG PO TABS: 1.0000 | ORAL_TABLET | ORAL | Status: DC | PRN

## 2021-10-27 MED ORDER — ACETAMINOPHEN 650 MG RE SUPP: 650.0000 mg | Freq: Four times a day (QID) | RECTAL | Status: DC | PRN

## 2021-10-27 MED ORDER — LEVETIRACETAM 500 MG PO TABS
500.0000 mg | ORAL_TABLET | Freq: Two times a day (BID) | ORAL | Status: DC
  Administered 2021-10-28 (×2): 500 mg via ORAL
  Filled 2021-10-27 (×2): qty 1

## 2021-10-27 MED ORDER — ACETAMINOPHEN 325 MG PO TABS: 650.0000 mg | ORAL_TABLET | Freq: Four times a day (QID) | ORAL | Status: DC | PRN

## 2021-10-27 NOTE — Assessment & Plan Note (Signed)
Chronic mild not on any statins. ?

## 2021-10-27 NOTE — H&P (Signed)
Patricia Stevenson:[id number] DOB: 1961/06/30 DOA: 10/27/2021     PCP: Isaac Bliss, Rayford Halsted, MD   Outpatient Specialists:     oncologist is Dr. Mickeal Skinner who recommended chemo and radiation. Patient arrived to ER on 10/27/21 at [facility] Referred by Attending Regan Lemming, MD   Patient coming from:    home Lives  With family    Chief Complaint:  Chief Complaint  Patient presents with   Weakness    HPI: Patricia Stevenson is a 61 y.o. female with medical history significant of glioma status postresection, HLD, GERD, atrial fibrillation    Presented with  left side weakness Reports 3-day history of left-sided weakness was having trouble to tie her shoelaces Left-sided arm drift present left-sided facial droop has hard time walking.  Patient with  history of right temporal glioma status postcraniotomy in January and supposed to have chemo and radiation starting Monday   Recent history : in January 2023 presented with left sided weakness for several weeks. She had an outpatient head CT which demonstrated a hyperdense mass in the right midbrain and thalamus measuring up to 3.4 cm. MRI showed the mass infiltrating most of the right temporal lobe and extending into the insula with extension into the right lateral basal ganglia and a 5 mm midline shift to the left.  She was treated with IV decadron and Keppra. On 09/15/21 she underwent right craniotomy with tumor resection. 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 Post op complicated by postop pneumocephalus, subdural hematoma right temporal lobe, and mild amount of subarachnoid hemorrhage. Developed transient a.fib  oncologist is Dr. Mickeal Skinner who recommended chemo and radiation. she has recently removed her molar and was planing to start radiation and chemo on Monday   Initial COVID TEST  in house  PCR testing  Pending  Lab Results  Component Value Date   [place] 09/15/2021   [place] NEGATIVE 09/08/2021    [place] Not Detected 06/29/2019     Regarding pertinent Chronic problems:      On Keppra for seizure prophylaxis       While in ER:    Noted to have left side wekness NS was consulted rec MRI and start steroids will se in AM   Ordered  CT HEAD   NON acute Expected evolutionary findings related to the previous debulking surgery in the right temporal lobe. Resolution of previously seen right subdural blood and air, with only a small amount of residual subdural low-density fluid. Residual infiltrating tumor in the right temporal lobe, inferior frontal lobe, cerebral peduncle, midbrain and pons appears quite similar to the studies of late January. No sign of acute ischemic infarction or other acute finding to explain the clinical change.   MRI pending  Following Medications were ordered in ER: Medications  dexamethasone (DECADRON) injection 4 mg (has no administration in time range)  acetaminophen (TYLENOL) tablet 650 mg (650 mg Oral Given 10/27/21 2137)    _______________________________________________________ ER Provider Called:     neurosurgery They Recommend admit to medicine   Will see in AM   decadrone '4mg'$  q 6h    ED Triage Vitals  Enc Vitals Group     BP 10/27/21 1759 (!) 133/97     Pulse Rate 10/27/21 1759 83     Resp 10/27/21 1759 16     Temp 10/27/21 1759 99.2 F (37.3 C)     Temp Source 10/27/21 1759 Oral     SpO2 10/27/21 1759 98 %  Weight --      Height --      Head Circumference --      Peak Flow --      Pain Score 10/27/21 1756 4     Pain Loc --      Pain Edu? --      Excl. in [place]? --   TMAX(24)@     _________________________________________ Significant initial  Findings: Abnormal Labs Reviewed  BASIC METABOLIC PANEL - Abnormal; Notable for the following components:      Result Value   CO2 21 (*)    All other components within normal limits        The recent clinical data is shown below. Vitals:   10/27/21 1830 10/27/21 1845  10/27/21 1900 10/27/21 2140  BP: (!) 139/92  (!) 141/87 (!) 151/103  Pulse: 71 66 69 72  Resp: '16 17 17 15  '$ Temp:      TempSrc:      SpO2: 99% 99% 98% 99%    WBC     Component Value Date/Time   WBC 7.0 10/27/2021 1904   LYMPHSABS 2.3 10/27/2021 1904   MONOABS 0.5 10/27/2021 1904   EOSABS 0.0 10/27/2021 1904   BASOSABS 0.1 10/27/2021 1904       UA  ordered    Results for orders placed or performed during the hospital encounter of 09/15/21  SARS Coronavirus 2 by RT PCR (hospital order, performed in [facility] hospital lab) Nasopharyngeal Nasopharyngeal Swab     Status: None   Collection Time: 09/15/21  5:47 AM   Specimen: Nasopharyngeal Swab  Result Value Ref Range Status   SARS Coronavirus 2 NEGATIVE NEGATIVE Final        MRSA Next Gen by PCR, Nasal     Status: None   Collection Time: 09/15/21  4:33 PM   Specimen: Nasal Mucosa; Nasal Swab  Result Value Ref Range Status   MRSA by PCR Next Gen NOT DETECTED NOT DETECTED Final            _______________________________________________ Hospitalist was called for admission for left sided weakness with hx of glioma  The following Work up has been ordered so far:  Orders Placed This Encounter  Procedures   CT Head Wo Contrast   MR Brain W and Wo Contrast   Basic metabolic panel   CBC with Differential   Consult to neurosurgery   Consult to hospitalist     OTHER Significant initial  Findings:  labs showing:    Recent Labs  Lab 10/23/21 1321 10/27/21 1904  NA 139 138  K 3.9 3.9  CO2 28 21*  GLUCOSE 93 86  BUN 13 17  CREATININE 0.51 0.56  CALCIUM 9.3 9.1    Cr    stable,    Lab Results  Component Value Date   CREATININE 0.56 10/27/2021   CREATININE 0.51 10/23/2021   CREATININE 0.58 09/19/2021    Recent Labs  Lab 10/23/21 1321  AST 14*  ALT 26  ALKPHOS 92  BILITOT 0.3  PROT 7.1  ALBUMIN 4.1   Lab Results  Component Value Date   CALCIUM 9.1 10/27/2021       Plt: Lab Results  Component  Value Date   PLT 311 10/27/2021        COVID-19 Labs  No results for input(s): DDIMER, FERRITIN, LDH, CRP in the last 72 hours.  Lab Results  Component Value Date   [place] NEGATIVE 09/15/2021   [place] NEGATIVE 09/08/2021   SARSCOV2NAA  Not Detected 06/29/2019    Recent Labs  Lab 10/23/21 1321 10/27/21 1904  WBC 6.2 7.0  NEUTROABS 4.1 4.1  HGB 13.1 14.1  HCT 40.6 42.6  MCV 92.1 93.4  PLT 346 311    HG/HCT   stable,      Component Value Date/Time   HGB 14.1 10/27/2021 1904   HGB 13.1 10/23/2021 1321   HCT 42.6 10/27/2021 1904   MCV 93.4 10/27/2021 1904      DM  labs:  HbA1C: Recent Labs    05/08/21 1006  HGBA1C 5.5       CBG (last 3)  No results for input(s): GLUCAP in the last 72 hours.        Cultures: No results found for: SDES, SPECREQUEST, CULT, REPTSTATUS   Radiological Exams on Admission: CT Head Wo Contrast  Result Date: 10/27/2021 CLINICAL DATA:  Brain/CNS neoplasm. Assess treatment response. Left-sided weakness. Right-sided glioma. Three day history of worsening left-sided weakness. EXAM: CT HEAD WITHOUT CONTRAST TECHNIQUE: Contiguous axial images were obtained from the base of the skull through the vertex without intravenous contrast. RADIATION DOSE REDUCTION: This exam was performed according to the departmental dose-optimization program which includes automated exposure control, adjustment of the mA and/or kV according to patient size and/or use of iterative reconstruction technique. COMPARISON:  MRI 09/16/2021.  CT 09/15/2021. FINDINGS: Brain: Previous right pterional craniotomy for debulking of a large infiltrating malignant brain mass of the right hemisphere and brainstem. Since the postoperative studies, there is been resolution of the postoperative findings of subdural blood and air, with only a small amount of residual subdural fluid. Residual tumor within the remaining right temporal lobe, inferior frontal lobe, right cerebral  peduncle, right midbrain and right pons appears similar. There is no identifiable acute ischemic infarction. No hydrocephalus. No midline shift. Vascular: No acute vascular finding. Skull: Right pterional craniotomy as expected. Sinuses/Orbits: Clear/normal Other: None IMPRESSION: Expected evolutionary findings related to the previous debulking surgery in the right temporal lobe. Resolution of previously seen right subdural blood and air, with only a small amount of residual subdural low-density fluid. Residual infiltrating tumor in the right temporal lobe, inferior frontal lobe, cerebral peduncle, midbrain and pons appears quite similar to the studies of late January. No sign of acute ischemic infarction or other acute finding to explain the clinical change. Electronically Signed   By: Nelson Chimes M.D.   On: 10/27/2021 21:02   _______________________________________________________________________________________________________ Latest   Blood pressure (!) 151/103, pulse 72, temperature 99.2 F (37.3 C), temperature source Oral, resp. rate 15, SpO2 99 %.   Vitals  labs and radiology finding personally reviewed  Review of Systems:    Pertinent positives include:   localizing neurological complaints, Weakness gait abnormality, slurred speech,  Constitutional:  No weight loss, night sweats, Fevers, chills, fatigue, weight loss  HEENT:  No headaches, Difficulty swallowing,Tooth/dental problems,Sore throat,  No sneezing, itching, ear ache, nasal congestion, post nasal drip,  Cardio-vascular:  No chest pain, Orthopnea, PND, anasarca, dizziness, palpitations.no Bilateral lower extremity swelling  GI:  No heartburn, indigestion, abdominal pain, nausea, vomiting, diarrhea, change in bowel habits, loss of appetite, melena, blood in stool, hematemesis Resp:  no shortness of breath at rest. No dyspnea on exertion, No excess mucus, no productive cough, No non-productive cough, No coughing up of blood.No  change in color of mucus.No wheezing. Skin:  no rash or lesions. No jaundice GU:  no dysuria, change in color of urine, no urgency or frequency. No straining to urinate.  No flank pain.  Musculoskeletal:  No joint pain or no joint swelling. No decreased range of motion. No back pain.  Psych:  No change in mood or affect. No depression or anxiety. No memory loss.  Neuro: no no tingling, no , no double vision,   All systems reviewed and apart from [place] all are negative _______________________________________________________________________________________________ Past Medical History:   Past Medical History:  Diagnosis Date   GERD (gastroesophageal reflux disease)    Hyperlipidemia    Vitamin D deficiency       Past Surgical History:  Procedure Laterality Date   APPLICATION OF CRANIAL NAVIGATION Right 09/15/2021   Procedure: APPLICATION OF CRANIAL NAVIGATION;  Surgeon: Vallarie Mare, MD;  Location: [facility];  Service: Neurosurgery;  Laterality: Right;   BREAST BIOPSY Left    20 years ago in [place]- benign- not sure if bx or cyst   CHOLECYSTECTOMY     Laparoscopic cholecystectomy 04/29/2021   CRANIOTOMY Right 09/15/2021   Procedure: Right Craniotomy Temporal Lobectomy with Brain Lab;  Surgeon: Vallarie Mare, MD;  Location: [facility];  Service: Neurosurgery;  Laterality: Right;    Social History:  Ambulatory   independently      reports that she has never smoked. She has never used smokeless tobacco. She reports that she does not drink alcohol and does not use drugs.     Family History:   Family History  Problem Relation Age of Onset   Breast cancer Neg Hx    CAD Neg Hx    ______________________________________________________________________________________________ Allergies: Allergies  Allergen Reactions   Latex      Prior to Admission medications   Medication Sig Start Date End Date Taking? Authorizing Provider  Ascorbic Acid (VITA-C PO) Take 1 tablet by  mouth daily.    [provider]  calcium carbonate (OSCAL) 1500 (600 Ca) MG TABS tablet Take by mouth 2 (two) times daily with a meal.    [provider]  cholecalciferol (VITAMIN D3) 25 MCG (1000 UNIT) tablet Take 1,000 Units by mouth daily.    [provider]  dicyclomine (BENTYL) 10 MG capsule Take 1 capsule (10 mg total) by mouth in the morning and at bedtime. As needed 09/03/21   Mauri Pole, MD  diltiazem (CARDIZEM CD) 120 MG 24 hr capsule Take 1 capsule (120 mg total) by mouth daily. 09/20/21   Vallarie Mare, MD  famotidine (PEPCID) 20 MG tablet Take 1 tablet (20 mg total) by mouth 2 (two) times daily. 09/03/21   Mauri Pole, MD  levETIRAcetam (KEPPRA) 500 MG tablet Take 1 tablet (500 mg total) by mouth 2 (two) times daily. 09/19/21   Vallarie Mare, MD    ___________________________________________________________________________________________________ Physical Exam: Vitals with BMI 10/27/2021 10/27/2021 10/27/2021  Height - - -  Weight - - -  BMI - - -  Systolic 568 127 -  Diastolic 517 87 -  Pulse 72 69 66     1. General:  in No  Acute distress   Chronically ill   -appearing 2. Psychological: Alert and  Oriented 3. Head/ENT:    Dry Mucous Membranes                          Head Non traumatic, neck supple                          Poor Dentition 4. SKIN:  decreased Skin turgor,  Skin clean Dry and intact no rash 5. Heart: Regular rate and rhythm no  Murmur, no Rub or gallop 6. Lungs    no wheezes or crackles   7. Abdomen: Soft,  non-tender, Non distended   obese  bowel sounds present 8. Lower extremities: no clubbing, cyanosis, no  edema 9. Neurologically  left side weakness. Lrft facial droop 10. MSK: Normal range of motion    Chart has been reviewed  ______________________________________________________________________________________________  Assessment/Plan 61 y.o. 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  Neurosurgery is aware.  order MRI with and without contrast Decadron 4 mg every 6 Continue Keppra Notify Dr. Mickeal Skinner patient has been admitted keep n.p.o. postmidnight in case may need any kind of procedures Neurochecks every 4  Hyperlipidemia Chronic mild not on any statins.  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 Mickeal Skinner has been emailed.  Plan as per neurosurgery  Paroxysmal atrial fibrillation (HCC) 1 episode of atrial fibrillation postsurgically.  Not on anticoagulation given high risk of bleeding CHA2DS2-VASc score 1 continue to monitor   Other plan as per orders.  DVT prophylaxis:  SCD     Code Status:    Code Status: Prior FULL CODE   as per patient   I had personally discussed CODE STATUS with patient and family   Family Communication:   Family  at  Bedside  plan of care was discussed   with Sister   Disposition Plan:     likely will need placement for rehabilitation                            Following barriers for discharge:                             Seen by neurosurgery weakness worked up                       Would benefit from PT/OT eval prior to [place] called: Neurosurgery has been made aware Dr. Mickeal Skinner sent email Admission status:  ED Disposition     ED Disposition  [place]: [facility] [[id number]]  Level of Care: Telemetry Medical [[id number]]  May admit patient to [facility] or [facility] if equivalent level of care is available:: No  Covid Evaluation: Asymptomatic Screening Protocol (No Symptoms)  Diagnosis: Left-sided weakness  [[id number]]  Admitting Physician: Toy Baker [[id number]]  Attending Physician: Toy Baker [[id number]]  Estimated length of stay: past midnight tomorrow  Certification:: I certify this patient will need inpatient services for at least 2 midnights           inpatient     I Expect 2 midnight stay secondary to severity of patient's current illness need for inpatient interventions justified by the following:     Severe lab/radiological/exam abnormalities including:   Left-sided weakness and extensive comorbidities including:  malignancy,    That  are currently affecting medical management.   I expect  patient to be hospitalized for 2 midnights requiring inpatient medical care.  Patient is at high risk for adverse outcome (such as loss of life or disability) if not treated.  Indication for inpatient stay as follows:    Need for operative/procedural  intervention     Need for IV steroids    Level of care     tele  For  24H      Lab Results  Component Value Date   [place] 09/15/2021     Precautions: admitted as  asymptomatic screening protocol      Jahaan Vanwagner 10/27/2021, 11:51 PM    [facility]     after 2 AM please page floor coverage PA If 7AM-7PM, please contact the day team taking care of the patient using [email or web address]   Patient was evaluated in the context of the global COVID-19 pandemic, which necessitated consideration that the patient might be at risk for infection with the SARS-CoV-2 virus that causes COVID-19. Institutional protocols and algorithms that pertain to the evaluation of patients at risk for COVID-19 are in a state of rapid change based on information released by regulatory bodies including the CDC and federal and state organizations. These policies and algorithms were followed during the patient's care.

## 2021-10-27 NOTE — Subjective & Objective (Signed)
Reports 3-day history of left-sided weakness was having trouble to tie her shoelaces ?Left-sided arm drift present left-sided facial droop has hard time walking.  Patient with  history of right temporal glioma status postcraniotomy in January and supposed to have chemo and radiation starting Monday ?

## 2021-10-27 NOTE — Assessment & Plan Note (Signed)
1 episode of atrial fibrillation postsurgically.  Not on anticoagulation given high risk of bleeding CHA2DS2-VASc score 1 continue to monitor ?

## 2021-10-27 NOTE — Assessment & Plan Note (Signed)
See above neurosurgery aware Dr. Mickeal Skinner has been emailed.  Plan as per neurosurgery ?

## 2021-10-27 NOTE — ED Triage Notes (Signed)
Pt reports three days of L sided weakness. Arm drift present, L sided facial droop, and very difficulty for patient to walk. Recently dx with R temporal glioma, for which she had crainiotomy in January and is about to start chemo and radiation on Monday. ?

## 2021-10-27 NOTE — Assessment & Plan Note (Signed)
Worrisome for progression of glioma.  Neurosurgery is aware. ? order MRI with and without contrast ?Decadron 4 mg every 6 ?Continue Keppra ?Notify Dr. Mickeal Skinner patient has been admitted keep n.p.o. postmidnight in case may need any kind of procedures ?Neurochecks every 4 ?

## 2021-10-27 NOTE — Telephone Encounter (Signed)
Daughter called to say that Patricia Stevenson just told her she has been having left sided weakness X 3 days. Is having difficulty tying shoelaces. Is going to take her to the ED ?

## 2021-10-28 ENCOUNTER — Telehealth: Payer: Self-pay | Admitting: Internal Medicine

## 2021-10-28 DIAGNOSIS — R531 Weakness: Secondary | ICD-10-CM | POA: Diagnosis not present

## 2021-10-28 LAB — CBC WITH DIFFERENTIAL/PLATELET
Abs Immature Granulocytes: 0.03 10*3/uL (ref 0.00–0.07)
Basophils Absolute: 0.1 10*3/uL (ref 0.0–0.1)
Basophils Relative: 1 %
Eosinophils Absolute: 0 10*3/uL (ref 0.0–0.5)
Eosinophils Relative: 0 %
HCT: 39.5 % (ref 36.0–46.0)
Hemoglobin: 13.4 g/dL (ref 12.0–15.0)
Immature Granulocytes: 0 %
Lymphocytes Relative: 14 %
Lymphs Abs: 1.1 10*3/uL (ref 0.7–4.0)
MCH: 31 pg (ref 26.0–34.0)
MCHC: 33.9 g/dL (ref 30.0–36.0)
MCV: 91.4 fL (ref 80.0–100.0)
Monocytes Absolute: 0.3 10*3/uL (ref 0.1–1.0)
Monocytes Relative: 3 %
Neutro Abs: 6.2 10*3/uL (ref 1.7–7.7)
Neutrophils Relative %: 82 %
Platelets: 323 10*3/uL (ref 150–400)
RBC: 4.32 MIL/uL (ref 3.87–5.11)
RDW: 14 % (ref 11.5–15.5)
WBC: 7.7 10*3/uL (ref 4.0–10.5)
nRBC: 0 % (ref 0.0–0.2)

## 2021-10-28 LAB — COMPREHENSIVE METABOLIC PANEL
ALT: 23 U/L (ref 0–44)
AST: 15 U/L (ref 15–41)
Albumin: 3.8 g/dL (ref 3.5–5.0)
Alkaline Phosphatase: 79 U/L (ref 38–126)
Anion gap: 10 (ref 5–15)
BUN: 12 mg/dL (ref 6–20)
CO2: 25 mmol/L (ref 22–32)
Calcium: 8.7 mg/dL — ABNORMAL LOW (ref 8.9–10.3)
Chloride: 100 mmol/L (ref 98–111)
Creatinine, Ser: 0.51 mg/dL (ref 0.44–1.00)
GFR, Estimated: >60 mL/min (ref >60)
Glucose, Bld: 104 mg/dL — ABNORMAL HIGH (ref 70–99)
Potassium: 3.6 mmol/L (ref 3.5–5.1)
Sodium: 135 mmol/L (ref 135–145)
Total Bilirubin: 0.2 mg/dL — ABNORMAL LOW (ref 0.3–1.2)
Total Protein: 6.7 g/dL (ref 6.5–8.1)

## 2021-10-28 LAB — HEPATIC FUNCTION PANEL
ALT: 21 U/L (ref 0–44)
AST: 15 U/L (ref 15–41)
Albumin: 3.6 g/dL (ref 3.5–5.0)
Alkaline Phosphatase: 81 U/L (ref 38–126)
Bilirubin, Direct: <0.1 mg/dL (ref 0.0–0.2)
Total Bilirubin: 0.5 mg/dL (ref 0.3–1.2)
Total Protein: 6.6 g/dL (ref 6.5–8.1)

## 2021-10-28 LAB — LACTIC ACID, PLASMA
Lactic Acid, Venous: 0.8 mmol/L (ref 0.5–1.9)
Lactic Acid, Venous: 1.1 mmol/L (ref 0.5–1.9)

## 2021-10-28 LAB — RESP PANEL BY RT-PCR (FLU A&B, COVID) ARPGX2
Influenza A by PCR: NEGATIVE
Influenza B by PCR: NEGATIVE
SARS Coronavirus 2 by RT PCR: NEGATIVE

## 2021-10-28 LAB — PHOSPHORUS: Phosphorus: 2.8 mg/dL (ref 2.5–4.6)

## 2021-10-28 LAB — CK: Total CK: 16 U/L — ABNORMAL LOW (ref 38–234)

## 2021-10-28 LAB — MAGNESIUM: Magnesium: 2.3 mg/dL (ref 1.7–2.4)

## 2021-10-28 LAB — TSH: TSH: 0.914 u[IU]/mL (ref 0.350–4.500)

## 2021-10-28 MED ORDER — DEXAMETHASONE 2 MG PO TABS: 2.0000 mg | ORAL_TABLET | Freq: Two times a day (BID) | ORAL | 0 refills | Status: AC

## 2021-10-28 NOTE — Assessment & Plan Note (Addendum)
Patient presenting to ED with left-sided weakness, similar to previous in which she was diagnosed with a high-grade glioma s/p resection. CT head without contrast with expected evolutionary findings related to the previous debulking surgery right temporal lobe, resolution of previously seen right subdural blood and air with small amount of residual subdural low-density fluid, residual infiltrating tumor right temporal lobe, inferior frontal lobe, cerebral peduncle, midbrain and pons appears quite similar to studies of late January, no sign of acute ischemic infarction or other acute finding.  Brain with and without contrast notable for postoperative changes from prior right craniotomy for debulking of large infiltrative tumor, overall size and extent of the tumor is similar to previous however progressive patchy and ring enhancement at the right cerebral peduncle/midbrain suggesting disease progression, small residual right-sided postoperative subdural collection without significant mass effect, no other acute and cranial abnormality.  Seen by neurosurgery.  Started on IV dexamethasone with improvement of weakness.  Seen by PT with no recommendations.  Will continue dexamethasone 2 mg p.o. every 12 hours.  Patient has planned chemo/radiation on Monday.  Outpatient follow-up with neurosurgery, radiation oncology. ?

## 2021-10-28 NOTE — Discharge Summary (Signed)
Physician Discharge Summary  Patricia Stevenson BMW:[id number] DOB: 1960-12-15 DOA: 10/27/2021  PCP: Patricia Hau, MD  Admit date: 10/27/2021 Discharge date: 10/28/2021  Admitted From: Home Disposition: Home  Recommendations for Outpatient Follow-up:  Follow up with PCP in 1-2 weeks Outpatient follow-up with neurosurgery, radiation oncology, plan start chemo/radiation Monday Started on dexamethasone 2 mg p.o. every 12 hours  Home Health: No Equipment/Devices: None  Discharge Condition: Stable CODE STATUS: Full code Diet recommendation: Regular diet  History of present illness:  Patricia Stevenson is a 61 year old female with past medical history significant for paroxysmal atrial fibrillation, hyperlipidemia, high-grade glioma s/p resection who presented to [facility] ED on 3/7 with 3-day history of left-sided weakness.  Patient reports difficulty tying her shoelaces.    Recently had glioma resection January 2023 after initially presenting with left-sided weakness for several weeks with finding of a hyperdense mass in the right midbrain and thalamus measuring up to 3.4 cm with extension into the right lateral basal ganglia with midline shift.  Underwent right craniotomy with tumor resection by neurosurgery with plans for chemo and radiation starting Monday.  In the ED, temperature 99.2 F, HR 83 8, RR 16, BP 133/97, SPO2 98% on room air.  Sodium 138, potassium 3.9, chloride 104, CO2 21, glucose 86, BUN 17, creatinine 0.56.  WBC 7.0, hemoglobin 14.1, platelets 311.  Lactic acid 1.1.  TSH 0.914.  COVID-19 PCR negative.  Influenza A/B PCR negative.  CT head without contrast with expected evolutionary findings related to the previous debulking surgery right temporal lobe, resolution of previously seen right subdural blood and air with small amount of residual subdural low-density fluid, residual infiltrating tumor right temporal lobe, inferior frontal lobe, cerebral peduncle, midbrain and pons appears  quite similar to studies of late January, no sign of acute ischemic infarction or other acute finding.  Neurosurgery was consulted.  TRH consulted for further evaluation and management of left-sided weakness.  Hospital course:  Assessment and Plan: * Left-sided weakness Patient presenting to ED with left-sided weakness, similar to previous in which she was diagnosed with a high-grade glioma s/p resection. CT head without contrast with expected evolutionary findings related to the previous debulking surgery right temporal lobe, resolution of previously seen right subdural blood and air with small amount of residual subdural low-density fluid, residual infiltrating tumor right temporal lobe, inferior frontal lobe, cerebral peduncle, midbrain and pons appears quite similar to studies of late January, no sign of acute ischemic infarction or other acute finding.  Brain with and without contrast notable for postoperative changes from prior right craniotomy for debulking of large infiltrative tumor, overall size and extent of the tumor is similar to previous however progressive patchy and ring enhancement at the right cerebral peduncle/midbrain suggesting disease progression, small residual right-sided postoperative subdural collection without significant mass effect, no other acute and cranial abnormality.  Seen by neurosurgery.  Started on IV dexamethasone with improvement of weakness.  Seen by PT with no recommendations.  Will continue dexamethasone 2 mg p.o. every 12 hours.  Patient has planned chemo/radiation on Monday.  Outpatient follow-up with neurosurgery, radiation oncology.  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  Plan start chemotherapy and radiation on Monday.  Started on dexamethasone as above.  Continue Keppra 500 mg p.o. twice daily.  Paroxysmal atrial fibrillation (HCC) Continue Cardizem CD100 20 mg p.o. daily.  Not  on anticoagulation due to high bleeding risk.  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 (HCC)   Paroxysmal atrial fibrillation [facility])    Discharge Instructions  Discharge Instructions     Call MD for:  difficulty breathing, headache or visual disturbances   Complete by: As directed    Call MD for:  extreme fatigue   Complete by: As directed    Call MD for:  persistant dizziness or light-headedness   Complete by: As directed    Call MD for:  persistant nausea and vomiting   Complete by: As directed    Call MD for:  redness, tenderness, or signs of infection (pain, swelling, redness, odor or green/yellow discharge around incision site)   Complete by: As directed    Call MD for:  severe uncontrolled pain   Complete by: As directed    Call MD for:  temperature >100.4   Complete by: As directed    Diet - low sodium heart healthy   Complete by: As directed    Increase activity slowly   Complete by: As directed       Allergies as of 10/28/2021       Reactions   Latex         Medication List     STOP taking these medications    amoxicillin 500 MG capsule Commonly known as: AMOXIL       TAKE these medications    acetaminophen 500 MG tablet Commonly known as: TYLENOL Take 1,000 mg by mouth every 6 (six) hours as needed for moderate pain or headache.   calcium carbonate 1500 (600 Ca) MG Tabs tablet Commonly known as: OSCAL Take 1,500 mg by mouth 2 (two) times daily with a meal.   dexamethasone 2 MG tablet Commonly known as: DECADRON Take 1 tablet (2 mg total) by mouth 2 (two) times daily with a meal.   dicyclomine 10 MG capsule Commonly known as: BENTYL Take 1 capsule (10 mg total) by mouth in the morning and at bedtime. As needed What changed:  when to take this reasons to take this additional instructions   diltiazem 120 MG 24 hr capsule Commonly  known as: CARDIZEM CD Take 1 capsule (120 mg total) by mouth daily.   famotidine 20 MG tablet Commonly known as: Pepcid Take 1 tablet (20 mg total) by mouth 2 (two) times daily.   levETIRAcetam 500 MG tablet Commonly known as: Keppra Take 1 tablet (500 mg total) by mouth 2 (two) times daily.   VITA-C PO Take 500 mg by mouth daily. Unsure of dose   VITAMIN D PO Take 1,000 Units by mouth daily.        Follow-up Information     Isaac Bliss, Rayford Halsted, MD. Schedule an appointment as soon as possible for a visit in 1 week(s).   Specialty: Internal Medicine Contact information: [place] [address] [phone]         Janina Mayo, MD .   Specialty: Cardiology Contact information: [address] [place] [address] [phone]         Ventura Sellers, MD Follow up.   Specialties: Psychiatry, Neurology, Oncology Contact information: [place] [address] [phone]                Allergies  Allergen Reactions   Latex     Consultations: Neurosurgery, Dr. Marcello Moores   Procedures/Studies: CT Head Wo Contrast  Result Date: 10/27/2021 CLINICAL DATA:  Brain/CNS neoplasm. Assess treatment response. Left-sided weakness. Right-sided glioma. Three day history of worsening left-sided weakness. EXAM: CT HEAD WITHOUT CONTRAST TECHNIQUE: Contiguous axial images were obtained from the base of the skull through the vertex without intravenous contrast. RADIATION DOSE REDUCTION: This exam was performed according to the departmental dose-optimization program which includes automated exposure control, adjustment of the mA and/or kV according to patient size and/or use of iterative reconstruction technique. COMPARISON:  MRI 09/16/2021.  CT 09/15/2021. FINDINGS: Brain: Previous right pterional craniotomy for debulking of a large infiltrating malignant brain mass of the right hemisphere and brainstem. Since the  postoperative studies, there is been resolution of the postoperative findings of subdural blood and air, with only a small amount of residual subdural fluid. Residual tumor within the remaining right temporal lobe, inferior frontal lobe, right cerebral peduncle, right midbrain and right pons appears similar. There is no identifiable acute ischemic infarction. No hydrocephalus. No midline shift. Vascular: No acute vascular finding. Skull: Right pterional craniotomy as expected. Sinuses/Orbits: Clear/normal Other: None IMPRESSION: Expected evolutionary findings related to the previous debulking surgery in the right temporal lobe. Resolution of previously seen right subdural blood and air, with only a small amount of residual subdural low-density fluid. Residual infiltrating tumor in the right temporal lobe, inferior frontal lobe, cerebral peduncle, midbrain and pons appears quite similar to the studies of late January. No sign of acute ischemic infarction or other acute finding to explain the clinical change. Electronically Signed   By: Nelson Chimes M.D.   On: 10/27/2021 21:02   MR Brain W and Wo Contrast  Result Date: 10/27/2021 CLINICAL DATA:  61 year old female with history of high-grade glioma, status post subtotal resection, left-sided weakness. EXAM: MRI HEAD WITHOUT AND WITH CONTRAST TECHNIQUE: Multiplanar, multiecho pulse sequences of the brain and surrounding structures were obtained without and with intravenous contrast. CONTRAST:  5.30m GADAVIST GADOBUTROL 1 MMOL/ML IV SOLN COMPARISON:  CT from earlier the same day as well as previous examinations. FINDINGS: Brain: Postoperative changes from prior right pterional craniotomy for debulking of large infiltrating tumor centered at the right frontotemporal region again seen. Small residual extra-axial postoperative collection overlies the right frontotemporal convexity, measuring up to 5 mm in maximal thickness. No significant mass effect or midline shift.  Overlying postoperative dural thickening and enhancement within this region. Scattered chronic blood products seen within the resection cavity. Residual infiltrative tumor involving the inferior right frontal lobe and right temporal lobe, with extension into the right cerebral peduncle, midbrain, and pons again seen. Extension of tumor into the prepontine cistern again noted. Additional tumor involvement at the right quadrigeminal plate cistern as well, similar. While the overall size and extent of the tumor is similar, there is progressive patchy and ring enhancement at the right cerebral peduncle/midbrain (series 18, image 26, 24, 22), suggesting disease progression. No significant midline shift. No hydrocephalus or ventricular trapping. No evidence for acute infarct elsewhere within the brain. Gray-white matter differentiation otherwise maintained. No other mass lesion or mass effect. No other abnormal enhancement. Vascular: Major intracranial vascular flow voids are maintained. Skull and upper cervical spine: Craniocervical junction within normal limits. Bone marrow signal intensity normal. Postoperative changes from prior right pterional craniotomy without adverse features. Sinuses/Orbits: Globes orbital soft tissues within normal limits. Paranasal sinuses are largely clear. No mastoid effusion. Other: None. IMPRESSION: 1. Postoperative changes from prior right pterional craniotomy for debulking of large infiltrative tumor centered at the right frontotemporal region. While the overall size  and extent of the tumor is similar to previous, there is progressive patchy and ring enhancement at the right cerebral peduncle/midbrain, suggesting disease progression. 2. Small residual right-sided postoperative subdural collection without significant mass effect. 3. No other acute intracranial abnormality. Electronically Signed   By: Jeannine Boga M.D.   On: 10/27/2021 23:50     Subjective:   Discharge  Exam: Vitals:   10/28/21 0809 10/28/21 1148  BP: 123/83 (!) 131/92  Pulse: 91 (!) 107  Resp: (!) 24 18  Temp: 98.2 F (36.8 C) (!) 97.4 F (36.3 C)  SpO2: 98% 100%   Vitals:   10/28/21 0100 10/28/21 0331 10/28/21 0809 10/28/21 1148  BP: (!) 144/93 133/74 123/83 (!) 131/92  Pulse: 70 61 91 (!) 107  Resp: 13 18 (!) 24 18  Temp:  97.7 F (36.5 C) 98.2 F (36.8 C) (!) 97.4 F (36.3 C)  TempSrc:  Oral Oral Oral  SpO2: 100%  98% 100%  Weight:  53.5 kg      Physical Exam: GEN: NAD, alert and oriented x 3, thin in appearance, poor dentition HEENT: Right scalp surgical incision/wound noted, well approximated with no fluctuance/erythema, PERRL, EOMI, sclera clear, MMM PULM: CTAB w/o wheezes/crackles, normal respiratory effort CV: RRR w/o M/G/R GI: abd soft, NTND, NABS, no R/G/M MSK: no peripheral edema, muscle strength globally intact 5/5 bilateral upper/lower extremities NEURO: CN II-XII intact, no focal deficits, sensation to light touch intact PSYCH: normal mood/affect Integumentary: dry/intact, no rashes or wounds    The results of significant diagnostics from this hospitalization (including imaging, microbiology, ancillary and laboratory) are listed below for reference.     Microbiology: Recent Results (from the past 240 hour(s))  Resp Panel by RT-PCR (Flu A&B, Covid) Nasopharyngeal Swab     Status: None   Collection Time: 10/27/21 11:38 PM   Specimen: Nasopharyngeal Swab; Nasopharyngeal(NP) swabs in vial transport medium  Result Value Ref Range Status   SARS Coronavirus 2 by RT PCR NEGATIVE NEGATIVE Final    Comment: (NOTE) SARS-CoV-2 target nucleic acids are NOT DETECTED.  The SARS-CoV-2 RNA is generally detectable in upper respiratory specimens during the acute phase of infection. The lowest concentration of SARS-CoV-2 viral copies this assay can detect is 138 copies/mL. A negative result does not preclude SARS-Cov-2 infection and should not be used as the sole  basis for treatment or other patient management decisions. A negative result may occur with  improper specimen collection/handling, submission of specimen other than nasopharyngeal swab, presence of viral mutation(s) within the areas targeted by this assay, and inadequate number of viral copies(<138 copies/mL). A negative result must be combined with clinical observations, patient history, and epidemiological information. The expected result is Negative.  Fact Sheet for Patients:  EntrepreneurPulse.com.au  Fact Sheet for Healthcare Providers:  IncredibleEmployment.be  This test is no t yet approved or cleared by the [place] FDA and  has been authorized for detection and/or diagnosis of SARS-CoV-2 by FDA under an Emergency Use Authorization (EUA). This EUA will remain  in effect (meaning this test can be used) for the duration of the COVID-19 declaration under Section 564(b)(1) of the Act, 21 U.S.C.section 360bbb-3(b)(1), unless the authorization is terminated  or revoked sooner.       Influenza A by PCR NEGATIVE NEGATIVE Final   Influenza B by PCR NEGATIVE NEGATIVE Final    Comment: (NOTE) The Xpert Xpress SARS-CoV-2/FLU/RSV plus assay is intended as an aid in the diagnosis of influenza from Nasopharyngeal swab specimens and should not  be used as a sole basis for treatment. Nasal washings and aspirates are unacceptable for Xpert Xpress SARS-CoV-2/FLU/RSV testing.  Fact Sheet for Patients: EntrepreneurPulse.com.au  Fact Sheet for Healthcare Providers: IncredibleEmployment.be  This test is not yet approved or cleared by the [place] FDA and has been authorized for detection and/or diagnosis of SARS-CoV-2 by FDA under an Emergency Use Authorization (EUA). This EUA will remain in effect (meaning this test can be used) for the duration of the COVID-19 declaration under Section 564(b)(1) of the Act,  21 U.S.C. section 360bbb-3(b)(1), unless the authorization is terminated or revoked.  Performed at [facility], [place] [address], [place], [place] [address]      Labs: BNP (last 3 results) No results for input(s): BNP in the last 8760 hours. Basic Metabolic Panel: Recent Labs  Lab 10/23/21 1321 10/27/21 1904 10/28/21 0406  NA 139 138 135  K 3.9 3.9 3.6  CL 104 104 100  CO2 28 21* 25  GLUCOSE 93 86 104*  BUN '13 17 12  '$ CREATININE 0.51 0.56 0.51  CALCIUM 9.3 9.1 8.7*  MG  --   --  2.3  PHOS  --   --  2.8   Liver Function Tests: Recent Labs  Lab 10/23/21 1321 10/28/21 0406  AST 14* 15   15  ALT '26 21   23  '$ ALKPHOS 92 81   79  BILITOT 0.3 0.5   0.2*  PROT 7.1 6.6   6.7  ALBUMIN 4.1 3.6   3.8   No results for input(s): LIPASE, AMYLASE in the last 168 hours. No results for input(s): AMMONIA in the last 168 hours. CBC: Recent Labs  Lab 10/23/21 1321 10/27/21 1904 10/28/21 0406  WBC 6.2 7.0 7.7  NEUTROABS 4.1 4.1 6.2  HGB 13.1 14.1 13.4  HCT 40.6 42.6 39.5  MCV 92.1 93.4 91.4  PLT 346 311 323   Cardiac Enzymes: Recent Labs  Lab 10/28/21 0406  CKTOTAL 16*   BNP: Invalid input(s): POCBNP CBG: No results for input(s): GLUCAP in the last 168 hours. D-Dimer No results for input(s): DDIMER in the last 72 hours. Hgb A1c No results for input(s): HGBA1C in the last 72 hours. Lipid Profile No results for input(s): CHOL, HDL, LDLCALC, TRIG, CHOLHDL, LDLDIRECT in the last 72 hours. Thyroid function studies Recent Labs    10/28/21 0406  TSH 0.914   Anemia work up No results for input(s): VITAMINB12, FOLATE, FERRITIN, TIBC, IRON, RETICCTPCT in the last 72 hours. Urinalysis No results found for: COLORURINE, APPEARANCEUR, [place], [place], GLUCOSEU, [place], [place], [place], PROTEINUR, UROBILINOGEN, NITRITE, LEUKOCYTESUR Sepsis Labs Invalid input(s): PROCALCITONIN,  WBC,  LACTICIDVEN Microbiology Recent Results (from the past 240 hour(s))  Resp  Panel by RT-PCR (Flu A&B, Covid) Nasopharyngeal Swab     Status: None   Collection Time: 10/27/21 11:38 PM   Specimen: Nasopharyngeal Swab; Nasopharyngeal(NP) swabs in vial transport medium  Result Value Ref Range Status   SARS Coronavirus 2 by RT PCR NEGATIVE NEGATIVE Final    Comment: (NOTE) SARS-CoV-2 target nucleic acids are NOT DETECTED.  The SARS-CoV-2 RNA is generally detectable in upper respiratory specimens during the acute phase of infection. The lowest concentration of SARS-CoV-2 viral copies this assay can detect is 138 copies/mL. A negative result does not preclude SARS-Cov-2 infection and should not be used as the sole basis for treatment or other patient management decisions. A negative result may occur with  improper specimen collection/handling, submission of specimen other than nasopharyngeal swab, presence of  viral mutation(s) within the areas targeted by this assay, and inadequate number of viral copies(<138 copies/mL). A negative result must be combined with clinical observations, patient history, and epidemiological information. The expected result is Negative.  Fact Sheet for Patients:  EntrepreneurPulse.com.au  Fact Sheet for Healthcare Providers:  IncredibleEmployment.be  This test is no t yet approved or cleared by the [place] FDA and  has been authorized for detection and/or diagnosis of SARS-CoV-2 by FDA under an Emergency Use Authorization (EUA). This EUA will remain  in effect (meaning this test can be used) for the duration of the COVID-19 declaration under Section 564(b)(1) of the Act, 21 U.S.C.section 360bbb-3(b)(1), unless the authorization is terminated  or revoked sooner.       Influenza A by PCR NEGATIVE NEGATIVE Final   Influenza B by PCR NEGATIVE NEGATIVE Final    Comment: (NOTE) The Xpert Xpress SARS-CoV-2/FLU/RSV plus assay is intended as an aid in the diagnosis of influenza from Nasopharyngeal  swab specimens and should not be used as a sole basis for treatment. Nasal washings and aspirates are unacceptable for Xpert Xpress SARS-CoV-2/FLU/RSV testing.  Fact Sheet for Patients: EntrepreneurPulse.com.au  Fact Sheet for Healthcare Providers: IncredibleEmployment.be  This test is not yet approved or cleared by the [place] FDA and has been authorized for detection and/or diagnosis of SARS-CoV-2 by FDA under an Emergency Use Authorization (EUA). This EUA will remain in effect (meaning this test can be used) for the duration of the COVID-19 declaration under Section 564(b)(1) of the Act, 21 U.S.C. section 360bbb-3(b)(1), unless the authorization is terminated or revoked.  Performed at [facility], [place] [address] [place], [place] [address]      Time coordinating discharge: Over 30 minutes  SIGNED:   Tailor Lucking J [place] DO  [facility] 10/28/2021, 12:35 PM

## 2021-10-28 NOTE — Assessment & Plan Note (Addendum)
Follows with neurosurgery and radiation oncology outpatient.  Plan start chemotherapy and radiation on Monday.  Started on dexamethasone as above.  Continue Keppra 500 mg p.o. twice daily. ?

## 2021-10-28 NOTE — Evaluation (Signed)
Occupational Therapy Evaluation Patient Details Name: Patricia Stevenson MRN: [id number] DOB: 07-25-1961 Today's Date: 10/28/2021   History of Present Illness Pt is a 61 y.o. female with infiltrative GBM s/p subtotal resection who presented to the ED 3/7 with increased L side weakness.  Her MRI shows some progression of enhancing disease in her right midbrain and in her pons. PMH: HTN, HLD   Clinical Impression   Prior to this admission patient living at home with husband and pt's sister in 2nd floor apartment. She was mod I mobility in apartment without use of AD/DME. Family provides supervision on stairs and mobility in community. Patient also receiving assist for medication management due to minimal confusion with following pillbox. Family endorses patient does not drive and does not use the stove. Currently patient is at supervision level for transfers, and mod I for ADLs. OT will continue to follow acutely in order to address problem list outlined below, but no follow up OT recommended at discharge.      Recommendations for follow up therapy are one component of a multi-disciplinary discharge planning process, led by the attending physician.  Recommendations may be updated based on patient status, additional functional criteria and insurance authorization.   Follow Up Recommendations  No OT follow up    Assistance Recommended at Discharge Intermittent Supervision/Assistance  Patient can return home with the following A little help with bathing/dressing/bathroom;Assistance with cooking/housework;Assistance with feeding;Direct supervision/assist for medications management;Direct supervision/assist for financial management;Assist for transportation;Help with stairs or ramp for entrance    Functional Status Assessment  Patient has had a recent decline in their functional status and demonstrates the ability to make significant improvements in function in a reasonable and predictable amount of time.   Equipment Recommendations  None recommended by OT    Recommendations for Other Services       Precautions / Restrictions Precautions Precautions: Fall Restrictions Weight Bearing Restrictions: No      Mobility Bed Mobility Overal bed mobility: Modified Independent                  Transfers Overall transfer level: Needs assistance Equipment used: None Transfers: Sit to/from Stand Sit to Stand: Supervision           General transfer comment: supervision for safety      Balance Overall balance assessment: Mild deficits observed, not formally tested                                         ADL either performed or assessed with clinical judgement   ADL Overall ADL's : Modified independent                                       General ADL Comments: Patient able to compelte with extra time, family is around almost 24/7 if patient needs assist but primarily needs assist when fatigued     Vision Baseline Vision/History: 0 No visual deficits Ability to See in Adequate Light: 0 Adequate Patient Visual Report: Peripheral vision impairment;Diplopia;No change from baseline Vision Assessment?: Yes Eye Alignment: Within Functional Limits Ocular Range of Motion: Within Functional Limits Alignment/Gaze Preference: Within Defined Limits Tracking/Visual Pursuits: Able to track stimulus in all quads without difficulty Visual Fields: Left visual field deficit Diplopia Assessment: Disappears with one eye closed (Closes R eye  to see one image on L) Additional Comments: 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, has not extended, patient using compensatory strategies minimally     Perception     Praxis      Pertinent Vitals/Pain Pain Assessment Pain Assessment: No/denies pain     Hand Dominance Right   Extremity/Trunk Assessment Upper Extremity Assessment Upper Extremity Assessment: Overall WFL  for tasks assessed;LUE deficits/detail LUE Deficits / Details: Minimal weakness when holding out LUE against gravity, but strength intact grossly (bicep, tricep, deltoid), decreased grip strength in LUE LUE Sensation: WNL LUE Coordination: decreased fine motor   Lower Extremity Assessment Lower Extremity Assessment: Defer to PT evaluation LLE Deficits / Details: grossly 3/5   Cervical / Trunk Assessment Cervical / Trunk Assessment: Normal   Communication Communication Communication: Prefers language other than English (Able to speak English well. Daughter present to interpret when pt having difficulty conveying what she needed to say in [place].)   Cognition Arousal/Alertness: Awake/alert Behavior During Therapy: WFL for tasks assessed/performed Overall Cognitive Status: Within Functional Limits for tasks assessed                                       General Comments  VSS on RA    Exercises Other Exercises Other Exercises: Pt reports completing HEP daily consisting of bed and seated exercises. Educated pt on addition of standing exercises using sink/counter for support. Exercises includes squats, hip abd/add, and hip extension.   Shoulder Instructions      Home Living Family/patient expects to be discharged to:: Private residence Living Arrangements: Spouse/significant other;Other relatives Available Help at Discharge: Family;Available 24 hours/day Type of Home: Apartment Home Access: Stairs to enter Entrance Stairs-Number of Steps: 15 Entrance Stairs-Rails: Left Home Layout: One level     Bathroom Shower/Tub: Teacher, early years/pre: Standard Bathroom Accessibility: No   Home Equipment: Conservation officer, nature (2 wheels);BSC/3in1          Prior Functioning/Environment Prior Level of Function : Independent/Modified Independent             Mobility Comments: Pt has been independent with mobility in her apartment since recent discharge  following recent hospitalization. No AD needed. Family provides supervision for stairs and ambulation in community. ADLs Comments: Patient is able to complete ADLs but has assist with IADLs and medication management        OT Problem List: Decreased strength;Decreased activity tolerance;Impaired balance (sitting and/or standing);Impaired vision/perception;Impaired UE functional use      OT Treatment/Interventions:      OT Goals(Current goals can be found in the care plan section) Acute Rehab OT Goals Patient Stated Goal: to get back home OT Goal Formulation: With patient Time For Goal Achievement: 11/11/21 Potential to Achieve Goals: Good  OT Frequency:      Co-evaluation PT/OT/SLP Co-Evaluation/Treatment: Yes Reason for Co-Treatment: Complexity of the patient's impairments (multi-system involvement);For patient/therapist safety;To address functional/ADL transfers;Necessary to address cognition/behavior during functional activity   OT goals addressed during session: ADL's and self-care;Proper use of Adaptive equipment and DME;Strengthening/ROM      AM-PAC OT "6 Clicks" Daily Activity     Outcome Measure Help from another person eating meals?: None Help from another person taking care of personal grooming?: None Help from another person toileting, which includes using toliet, bedpan, or urinal?: None Help from another person bathing (including washing, rinsing, drying)?: None Help from  another person to put on and taking off regular upper body clothing?: None Help from another person to put on and taking off regular lower body clothing?: None 6 Click Score: 24   End of Session Equipment Utilized During Treatment: Gait belt Nurse Communication: Mobility status  Activity Tolerance: Patient tolerated treatment well Patient left: in bed;with call bell/phone within reach;with family/visitor present (Sitting EOB for breakfast)  OT Visit Diagnosis: Unsteadiness on feet (R26.81);Other  abnormalities of gait and mobility (R26.89);Muscle weakness (generalized) (M62.81)                Time: 8101-7510 OT Time Calculation (min): 30 min Charges:  OT General Charges $OT Visit: 1 Visit OT Evaluation $OT Eval Moderate Complexity: 1 Mod  Corinne Ports E. Jamea Robicheaux, OTR/L Acute Rehabilitation Services [phone] [place] 10/28/2021, 2:37 PM

## 2021-10-28 NOTE — Assessment & Plan Note (Signed)
Continue Cardizem CD100 20 mg p.o. daily.  Not on anticoagulation due to high bleeding risk.  ?

## 2021-10-28 NOTE — Telephone Encounter (Signed)
Scheduled appointments per inbasket message. Patient is aware.  ?

## 2021-10-28 NOTE — Hospital Course (Signed)
Patricia Stevenson is a 61 year old female with past medical history significant for paroxysmal atrial fibrillation, hyperlipidemia, high-grade glioma s/p resection who presented to [facility] ED on 3/7 with 3-day history of left-sided weakness.  Patient reports difficulty tying her shoelaces.   ? ?Recently had glioma resection January 2023 after initially presenting with left-sided weakness for several weeks with finding of a hyperdense mass in the right midbrain and thalamus measuring up to 3.4 cm with extension into the right lateral basal ganglia with midline shift.  Underwent right craniotomy with tumor resection by neurosurgery with plans for chemo and radiation starting Monday. ? ?In the ED, temperature 99.2 ?F, HR 83 8, RR 16, BP 133/97, SPO2 98% on room air.  Sodium 138, potassium 3.9, chloride 104, CO2 21, glucose 86, BUN 17, creatinine 0.56.  WBC 7.0, hemoglobin 14.1, platelets 311.  Lactic acid 1.1.  TSH 0.914.  COVID-19 PCR negative.  Influenza A/B PCR negative.  CT head without contrast with expected evolutionary findings related to the previous debulking surgery right temporal lobe, resolution of previously seen right subdural blood and air with small amount of residual subdural low-density fluid, residual infiltrating tumor right temporal lobe, inferior frontal lobe, cerebral peduncle, midbrain and pons appears quite similar to studies of late January, no sign of acute ischemic infarction or other acute finding.  Neurosurgery was consulted.  TRH consulted for further evaluation and management of left-sided weakness. ?

## 2021-10-28 NOTE — Evaluation (Signed)
Physical Therapy Evaluation ?Patient Details ?Name: Patricia Stevenson ?MRN: [id number] ?DOB: [birth year] ?Today's Date: 10/28/2021 ? ?History of Present Illness ? Pt is a 61 y.o. female with infiltrative GBM s/p subtotal resection who presented to the ED 3/7 with increased L side weakness.  Her MRI shows some progression of enhancing disease in her right midbrain and in her pons. PMH: HTN, HLD ?  ?Clinical Impression ? Pt admitted with above diagnosis. PTA pt living at home with husband and pt's sister in 2nd floor apartment. She was mod I mobility in apartment without use of AD/DME. Family provides supervision on stairs and mobility in community.  Pt currently with functional limitations due to the deficits listed below (see PT Problem List). On eval, pt required supervision transfers, and min guard assist ambulation 250' without AD. Strength deficits noted LUE/LE but pt reports this has improved since admission. Pt will benefit from skilled PT to increase their independence and safety with mobility to allow discharge home. PT to follow acutely. No follow up services indicated.    ?   ?   ? ?Recommendations for follow up therapy are one component of a multi-disciplinary discharge planning process, led by the attending physician.  Recommendations may be updated based on patient status, additional functional criteria and insurance authorization. ? ?Follow Up Recommendations No PT follow up ? ?  ?Assistance Recommended at Discharge Intermittent Supervision/Assistance  ?Patient can return home with the following ?   ? ?  ?Equipment Recommendations None recommended by PT  ?Recommendations for Other Services ?    ?  ?Functional Status Assessment Patient has had a recent decline in their functional status and demonstrates the ability to make significant improvements in function in a reasonable and predictable amount of time.  ? ?  ?Precautions / Restrictions Precautions ?Precautions: Fall  ? ?  ? ?Mobility ? Bed Mobility ?Overal  bed mobility: Modified Independent ?  ?  ?  ?  ?  ?  ?  ?  ? ?Transfers ?Overall transfer level: Needs assistance ?Equipment used: None ?Transfers: Sit to/from Stand ?Sit to Stand: Supervision ?  ?  ?  ?  ?  ?General transfer comment: supervision for safety ?  ? ?Ambulation/Gait ?Ambulation/Gait assistance: Min guard ?Gait Distance (Feet): 250 Feet ?Assistive device: None ?Gait Pattern/deviations: Step-through pattern, Decreased stride length ?Gait velocity: mildly decreased ?Gait velocity interpretation: 1.31 - 2.62 ft/sec, indicative of limited community ambulator ?  ?General Gait Details: min guard assist for safety. No LOB noted. No knee buckling noted. Daughter reports pt appears to be walking faster than her baseline. ? ?Stairs ?  ?  ?  ?  ?  ? ?Wheelchair Mobility ?  ? ?Modified Rankin (Stroke Patients Only) ?  ? ?  ? ?Balance Overall balance assessment: Mild deficits observed, not formally tested ?  ?  ?  ?  ?  ?  ?  ?  ?  ?  ?  ?  ?  ?  ?  ?  ?  ?  ?   ? ? ? ?Pertinent Vitals/Pain Pain Assessment ?Pain Assessment: No/denies pain  ? ? ?Home Living Family/patient expects to be discharged to:: Private residence ?Living Arrangements: Spouse/significant other;Other relatives ?Available Help at Discharge: Family;Available 24 hours/day ?Type of Home: Apartment ?Home Access: Stairs to enter ?Entrance Stairs-Rails: Left ?Entrance Stairs-Number of Steps: 15 ?  ?Home Layout: One level ?Home Equipment: Rolling Walker (2 wheels);BSC/3in1 ?   ?  ?Prior Function Prior Level of Function : Independent/Modified Independent ?  ?  ?  ?  ?  ?  ?  Mobility Comments: Pt has been independent with mobility in her apartment since recent discharge following recent hospitalization. No AD needed. Family provides supervision for stairs and ambulation in community. ?  ?  ? ? ?Hand Dominance  ? Dominant Hand: Right ? ?  ?Extremity/Trunk Assessment  ? Upper Extremity Assessment ?Upper Extremity Assessment: Defer to OT evaluation ?  ? ?Lower  Extremity Assessment ?Lower Extremity Assessment: LLE deficits/detail ?LLE Deficits / Details: grossly 3/5 ?  ? ?   ?Communication  ? Communication: Prefers language other than English (Able to speak [place] well. Daughter present to interpret when pt having difficulty conveying what she needed to say in [place].)  ?Cognition Arousal/Alertness: Awake/alert ?Behavior During Therapy: [facility] for tasks assessed/performed ?Overall Cognitive Status: Within Functional Limits for tasks assessed ?  ?  ?  ?  ?  ?  ?  ?  ?  ?  ?  ?  ?  ?  ?  ?  ?  ?  ?  ? ?  ?General Comments General comments (skin integrity, edema, etc.): VSS on RA ? ?  ?Exercises Other Exercises ?Other Exercises: Pt reports completing HEP daily consisting of bed and seated exercises. Educated pt on addition of standing exercises using sink/counter for support. Exercises includes squats, hip abd/add, and hip extension.  ? ?Assessment/Plan  ?  ?PT Assessment Patient needs continued PT services  ?PT Problem List Decreased mobility;Decreased balance;Decreased strength ? ?   ?  ?PT Treatment Interventions Therapeutic activities;Gait training;Therapeutic exercise;Patient/family education;Balance training;Stair training;Functional mobility training   ? ?PT Goals (Current goals can be found in the Care Plan section)  ?Acute Rehab PT Goals ?Patient Stated Goal: home ?PT Goal Formulation: With patient/family ?Time For Goal Achievement: 11/11/21 ?Potential to Achieve Goals: Good ? ?  ?Frequency Min 4X/week ?  ? ? ?Co-evaluation   ?  ?  ?  ?  ? ? ?  ?AM-PAC PT "6 Clicks" Mobility  ?Outcome Measure Help needed turning from your back to your side while in a flat bed without using bedrails?: None ?Help needed moving from lying on your back to sitting on the side of a flat bed without using bedrails?: None ?Help needed moving to and from a bed to a chair (including a wheelchair)?: A Little ?Help needed standing up from a chair using your arms (e.g., wheelchair or bedside  chair)?: A Little ?Help needed to walk in hospital room?: A Little ?Help needed climbing 3-5 steps with a railing? : A Little ?6 Click Score: 20 ? ?  ?End of Session Equipment Utilized During Treatment: Gait belt ?Activity Tolerance: Patient tolerated treatment well ?Patient left: in bed;with call bell/phone within reach;with family/visitor present ?Nurse Communication: Mobility status ?PT Visit Diagnosis: Difficulty in walking, not elsewhere classified (R26.2) ?  ? ?Time: 1610-9604 ?PT Time Calculation (min) (ACUTE ONLY): 31 min ? ? ?Charges:   PT Evaluation ?$PT Eval Moderate Complexity: 1 Mod ?  ?  ?   ? ? ?Lorrin Goodell, PT  ?Office # [phone] ?Pager [phone] ? ? ?Lorriane Shire ?10/28/2021, 11:51 AM ? ?

## 2021-10-28 NOTE — Consult Note (Signed)
CC: Weakness ? ?HPI: ? ?  ? ?Patient is a 61 y.o. female who recently underwent subtotal resection of a large infiltrative IDH wt GBM.  She did well postoperatively and was weaned off steroids successfully in February.  However, she recently noticed increased left sided weakness.  An MRI showed some progression of disease in her midbrain and pons.  Chemoradiation has been planned for next Monday. ? ? ? ?Patient Active Problem List  ? Diagnosis Date Noted  ? Left-sided weakness 10/27/2021  ? Paroxysmal atrial fibrillation ([place]) 10/27/2021  ? Atrial fibrillation with RVR ([place]) 09/16/2021  ? Hypertension 09/16/2021  ? 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 High grade glioma not classifiable by WHO criteria ([place]) 09/15/2021  ? Hyperlipidemia 05/23/2020  ? Vitamin D deficiency 05/23/2020  ? AMENORRHEA 03/13/2009  ? ?Past Medical History:  ?Diagnosis Date  ? GERD (gastroesophageal reflux disease)   ? Hyperlipidemia   ? Vitamin D deficiency   ?  ?Past Surgical History:  ?Procedure Laterality Date  ? APPLICATION OF CRANIAL NAVIGATION Right 09/15/2021  ? Procedure: APPLICATION OF CRANIAL NAVIGATION;  Surgeon: Vallarie Mare, MD;  Location: [facility];  Service: Neurosurgery;  Laterality: Right;  ? BREAST BIOPSY Left   ? 20 years ago in [place]- benign- not sure if bx or cyst  ? CHOLECYSTECTOMY    ? Laparoscopic cholecystectomy 04/29/2021  ? CRANIOTOMY Right 09/15/2021  ? Procedure: Right Craniotomy Temporal Lobectomy with Brain Lab;  Surgeon: Vallarie Mare, MD;  Location: [place];  Service: Neurosurgery;  Laterality: Right;  ?  ?Medications Prior to Admission  ?Medication Sig Dispense Refill Last Dose  ? acetaminophen (TYLENOL) 500 MG tablet Take 1,000 mg by mouth every 6 (six) hours as needed for moderate pain or headache.   Past Month  ? Ascorbic Acid (VITA-C PO) Take 500 mg by mouth daily. Unsure of dose   10/27/2021  ? calcium carbonate (OSCAL) 1500 (600 Ca) MG TABS tablet Take 1,500 mg by mouth 2 (two) times daily with a  meal.   10/27/2021  ? dicyclomine (BENTYL) 10 MG capsule Take 1 capsule (10 mg total) by mouth in the morning and at bedtime. As needed (Patient taking differently: Take 10 mg by mouth 2 (two) times daily as needed for spasms.) 90 capsule 0 unk  ? diltiazem (CARDIZEM CD) 120 MG 24 hr capsule Take 1 capsule (120 mg total) by mouth daily. 60 capsule 3 10/27/2021  ? famotidine (PEPCID) 20 MG tablet Take 1 tablet (20 mg total) by mouth 2 (two) times daily. 60 tablet 3 10/27/2021  ? levETIRAcetam (KEPPRA) 500 MG tablet Take 1 tablet (500 mg total) by mouth 2 (two) times daily. 60 tablet 2 10/27/2021  ? VITAMIN D PO Take 1,000 Units by mouth daily.   10/27/2021  ? amoxicillin (AMOXIL) 500 MG capsule Take 500 mg by mouth See admin instructions. Every 8 hours x 5 days (Patient not taking: Reported on 10/27/2021)   Completed Course  ? ?Allergies  ?Allergen Reactions  ? Latex   ?  ?Social History  ? ?Tobacco Use  ? Smoking status: Never  ? Smokeless tobacco: Never  ?Substance Use Topics  ? Alcohol use: No  ?  ?Family History  ?Problem Relation Age of Onset  ? Breast cancer Neg Hx   ? CAD Neg Hx   ?  ? ?Review of Systems ?Pertinent items are noted in HPI. ? ?Objective:  ? ?Patient Vitals for the past 8 hrs: ? BP Temp Temp src  Pulse Resp SpO2 Weight  ?10/28/21 0809 123/83 98.2 ?F (36.8 ?C) Oral 91 (!) 24 98 % --  ?10/28/21 0331 133/74 97.7 ?F (36.5 ?C) Oral 61 18 -- 53.5 kg  ? ?No intake/output data recorded. ?No intake/output data recorded. ? ?NAD ?Incision well-healed ?Speech fluent ?Mild left facial droop, left pronator drift, LLE 4/5 ? ? ? ?Data ReviewCBC:  ?Lab Results  ?Component Value Date  ? WBC 7.7 10/28/2021  ? RBC 4.32 10/28/2021  ? ?BMP:  ?Lab Results  ?Component Value Date  ? GLUCOSE 104 (H) 10/28/2021  ? CO2 25 10/28/2021  ? BUN 12 10/28/2021  ? CREATININE 0.51 10/28/2021  ? CREATININE 0.51 10/23/2021  ? CREATININE 0.66 05/21/2020  ? CALCIUM 8.7 (L) 10/28/2021  ? ?Radiology review:  ?CT head and MRI reviewed ? ?Assessment:   ? ?Principal Problem: ?  Left-sided weakness ?Active Problems: ?  Hyperlipidemia ?  High grade glioma not classifiable by WHO criteria ([place]) ?  Paroxysmal atrial fibrillation ([facility]) ? ?61 yo F with infiltrative GBM s/p subtotal resection who has increased weakness.  Her MRI shows some progression of enhancing disease in her right midbrain and in her pons which likely explains her new weakness. ? ?Plan:  ?- she likely will need to resume her steroids, dexamethsone 2 mg q 12 PO to help with her weakness and likely can be discharged today if other medical issues stable.  Continue current plan for chemoradiation.  She will f/u with Dr. Mickeal Skinner who will titrate or wean her steroids as tolerated.  She and her daughter understood the plan. All questions and concerns were answered. ? ? ?

## 2021-10-28 NOTE — Progress Notes (Signed)
?  Transition of Care (TOC) Screening Note ? ? ?Patient Details  ?Name: Patricia Stevenson ?Date of Birth: Apr 20, 1961 ? ? ?Transition of Care (TOC) CM/SW Contact:    ?Cyndi Bender, RN ?Phone Number: ?10/28/2021, 8:47 AM ? ? ? ?Transition of Care Department [facility]) has reviewed patient and no TOC needs have been identified at this time. We will continue to monitor patient advancement through interdisciplinary progression rounds. If new patient transition needs arise, please place a TOC consult. ? ? ?

## 2021-10-29 ENCOUNTER — Telehealth: Payer: Self-pay

## 2021-10-29 MED ORDER — TEMOZOLOMIDE 100 MG PO CAPS: 100.0000 mg | ORAL_CAPSULE | Freq: Every day | ORAL | 0 refills | Status: DC

## 2021-10-29 MED ORDER — ONDANSETRON HCL 8 MG PO TABS: 8.0000 mg | ORAL_TABLET | Freq: Two times a day (BID) | ORAL | 1 refills | Status: AC | PRN

## 2021-10-29 NOTE — Telephone Encounter (Signed)
Called daughter to advise that Dr. Mickeal Skinner advised that patient can continue taking Decadron that was prescribed during last hospitalization. ?

## 2021-10-29 NOTE — Telephone Encounter (Signed)
Oral Chemotherapy Pharmacist Encounter ? 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  ? ?Counseled on administration, dosing, side effects, monitoring, drug-food interactions, safe handling, storage, and disposal. Discussed special handling of medication with patient's daughter as she is the one who handles her mother's medications.  ? ?Patient will take Temodar '100mg'$  capsules, 1 capsule, 100 mg total daily dose, by mouth once daily, may take at bedtime and on an empty stomach to decrease nausea and vomiting. ? ?Patient will take Temodar concurrent with radiation for 42 days straight. ? ?Temodar start date: 11/01/21 ?Radiation start date: 11/02/21 ?  ?Patient will take Zofran '8mg'$  tablet, 1 tablet by mouth 30-60 min prior to Temodar dose to help decrease N/V once starting adjuvant therapy. ?Prophylactic Zofran will not be used at initiation of concurrent phase, but will be initiated if nausea develops despite Temodar administration on an empty stomach and at bedtime. Zofran had been inadvertently discontinued at hospital discharge on 10/28/21 - confirmed with [name] that they do not have anti-emetic at home. Prescription that was discontinued was redirected to patient's [place] for dispensing.  ?  ?Adverse effects include but are not limited to: nausea, vomiting, anorexia, GI upset, rash, drug fever, and fatigue. Rare but serious adverse effects of pneumocystis pneumonia and secondary malignancy also discussed. ? ?PCP prophylaxis will not be initiated at this time, but may be added based on lymphocyte count in the future. ? ?Reviewed importance of keeping a medication schedule and plan for any missed doses. No barriers to medication adherence identified. ? ?Medication reconciliation performed and medication/allergy list updated. ? ?Insurance authorization  for Temodar has been obtained. ? ?All questions answered. ? ?[place] voiced understanding and appreciation.  ? ?Medication education handout and medication calendar placed in mail for patient and patient's family. Patient and patient's family know to call the office with questions or concerns. Oral Chemotherapy Clinic phone number provided.  ? ?Leron Croak, PharmD, BCPS ?Hematology/Oncology Clinical Pharmacist ?[facility] Oral Chemotherapy [facility] ?[phone] ?10/29/2021 12:13 PM ? ?

## 2021-10-29 NOTE — Telephone Encounter (Signed)
duplicate

## 2021-10-29 NOTE — Telephone Encounter (Signed)
Daughter called asking if pt will continue taking the Decadron 2 mg twice daily which was added during her most recent hospitalization.  ?

## 2021-10-29 NOTE — ED Provider Notes (Signed)
Patricia Stevenson Provider Note   CSN: [id number] Arrival date & time: 10/27/21  [place]     History  Chief Complaint  Patient presents with   Weakness    Patricia Stevenson is a 61 y.o. female.  Patricia Stevenson is a 61 y.o. female history of right temporal glioma s/p resection, who presents to the emergency department for evaluation of left-sided weakness.  Weakness started 3 days ago and is present in her face as well as her left upper and lower extremity.  Weakness has been constant but not worsening since it began.  She now has required assistance walking and with holding things.  She reports that this weakness feels very similar to the weakness she experienced when her glioma was first diagnosed in December.  She had a successful resection in January with Dr. Marcello Moores and is set to start radiation and chemotherapy next week.  She reports some mild headache that started today but otherwise no headaches.  No associated sensory changes.  Denies visual changes or dizziness.  No nausea or vomiting.  No fevers or chills.  Reports she has not experienced similar weakness since her surgery until 3 days ago.  No other aggravating or alleviating factors.   The history is provided by the patient and a relative. The history is limited by a language barrier. A language interpreter was used.      Home Medications Prior to Admission medications   Medication Sig Start Date End Date Taking? Authorizing Provider  acetaminophen (TYLENOL) 500 MG tablet Take 1,000 mg by mouth every 6 (six) hours as needed for moderate pain or headache.   Yes [provider]  Ascorbic Acid (VITA-C PO) Take 500 mg by mouth daily. Unsure of dose   Yes [provider]  calcium carbonate (OSCAL) 1500 (600 Ca) MG TABS tablet Take 1,500 mg by mouth 2 (two) times daily with a meal.   Yes [provider]  dexamethasone (DECADRON) 2 MG tablet Take 1 tablet (2 mg total) by mouth 2 (two) times daily  with a meal. 10/28/21 11/27/21 Yes [place] Eric J, DO  dicyclomine (BENTYL) 10 MG capsule Take 1 capsule (10 mg total) by mouth in the morning and at bedtime. As needed Patient taking differently: Take 10 mg by mouth 2 (two) times daily as needed for spasms. 09/03/21  Yes Nandigam, Venia Minks, MD  diltiazem (CARDIZEM CD) 120 MG 24 hr capsule Take 1 capsule (120 mg total) by mouth daily. 09/20/21  Yes Vallarie Mare, MD  famotidine (PEPCID) 20 MG tablet Take 1 tablet (20 mg total) by mouth 2 (two) times daily. 09/03/21  Yes Nandigam, Venia Minks, MD  levETIRAcetam (KEPPRA) 500 MG tablet Take 1 tablet (500 mg total) by mouth 2 (two) times daily. 09/19/21  Yes Vallarie Mare, MD  VITAMIN D PO Take 1,000 Units by mouth daily.   Yes [provider]  ondansetron (ZOFRAN) 8 MG tablet Take 1 tablet (8 mg total) by mouth 2 (two) times daily as needed (nausea and vomiting). May take 30-60 minutes prior to Temodar administration if nausea/vomiting occurs. 10/29/21   Ventura Sellers, MD  temozolomide (TEMODAR) 100 MG capsule Take 1 capsule (100 mg total) by mouth daily. May take on an empty stomach to decrease nausea & vomiting. 10/29/21   Ventura Sellers, MD      Allergies    Latex    Review of Systems   Review of Systems  Constitutional:  Negative for chills and fever.  Respiratory:  Negative for cough and shortness of breath.   Cardiovascular:  Negative for chest pain.  Gastrointestinal:  Negative for abdominal pain, nausea and vomiting.  Neurological:  Positive for facial asymmetry, weakness and headaches. Negative for dizziness, seizures, syncope, speech difficulty, light-headedness and numbness.  All other systems reviewed and are negative.  Physical Exam Updated Vital Signs BP (!) 131/92 (BP Location: Right Arm)    Pulse (!) 107    Temp (!) 97.4 F (36.3 C) (Oral)    Resp 18    Wt 53.5 kg    SpO2 100%    BMI 23.03 kg/m  Physical Exam Vitals and nursing note reviewed.  Constitutional:       General: She is not in acute distress.    Appearance: Normal appearance. She is well-developed. She is not ill-appearing or diaphoretic.  HENT:     Head: Normocephalic and atraumatic.     Comments: Prior right temporal surgical scar noted Eyes:     General:        Right eye: No discharge.        Left eye: No discharge.     Extraocular Movements: Extraocular movements intact.     Pupils: Pupils are equal, round, and reactive to light.  Cardiovascular:     Rate and Rhythm: Normal rate and regular rhythm.     Pulses: Normal pulses.     Heart sounds: Normal heart sounds.  Pulmonary:     Effort: Pulmonary effort is normal. No respiratory distress.     Breath sounds: Normal breath sounds. No wheezing or rales.     Comments: Respirations equal and unlabored, patient able to speak in full sentences, lungs clear to auscultation bilaterally  Abdominal:     General: Bowel sounds are normal. There is no distension.     Palpations: Abdomen is soft. There is no mass.     Tenderness: There is no abdominal tenderness. There is no guarding.     Comments: Abdomen soft, nondistended, nontender to palpation in all quadrants without guarding or peritoneal signs  Musculoskeletal:        General: No deformity.     Cervical back: Neck supple.  Skin:    General: Skin is warm and dry.     Capillary Refill: Capillary refill takes less than 2 seconds.  Neurological:     Mental Status: She is alert and oriented to person, place, and time.     Motor: Weakness present.     Coordination: Coordination normal.     Comments: Speech is clear, able to follow commands Left-sided facial droop but otherwise cranial nerves intact 3/5 strength in the left upper and lower extremity, 5/5 strength on the right. Sensation normal to light and sharp touch Moves extremities without ataxia, coordination intact Slight pronator drift on the left, suspect this is more so due to weakness, difficulty with rapid alternating  movements on the left as well  Psychiatric:        Mood and Affect: Mood normal.        Behavior: Behavior normal.    ED Results / Procedures / Treatments   Labs (all labs ordered are listed, but only abnormal results are displayed) Labs Reviewed  BASIC METABOLIC PANEL - Abnormal; Notable for the following components:      Result Value   CO2 21 (*)    All other components within normal limits  CK - Abnormal; Notable for the following components:   Total  CK 16 (*)    All other components within normal limits  COMPREHENSIVE METABOLIC PANEL - Abnormal; Notable for the following components:   Glucose, Bld 104 (*)    Calcium 8.7 (*)    Total Bilirubin 0.2 (*)    All other components within normal limits  RESP PANEL BY RT-PCR (FLU A&B, COVID) ARPGX2  CBC WITH DIFFERENTIAL/PLATELET  HEPATIC FUNCTION PANEL  TSH  LACTIC ACID, PLASMA  LACTIC ACID, PLASMA  MAGNESIUM  PHOSPHORUS  CBC WITH DIFFERENTIAL/PLATELET    EKG None  Radiology CT Head Wo Contrast  Result Date: 10/27/2021 CLINICAL DATA:  Brain/CNS neoplasm. Assess treatment response. Left-sided weakness. Right-sided glioma. Three day history of worsening left-sided weakness. EXAM: CT HEAD WITHOUT CONTRAST TECHNIQUE: Contiguous axial images were obtained from the base of the skull through the vertex without intravenous contrast. RADIATION DOSE REDUCTION: This exam was performed according to the departmental dose-optimization program which includes automated exposure control, adjustment of the mA and/or kV according to patient size and/or use of iterative reconstruction technique. COMPARISON:  MRI 09/16/2021.  CT 09/15/2021. FINDINGS: Brain: Previous right pterional craniotomy for debulking of a large infiltrating malignant brain mass of the right hemisphere and brainstem. Since the postoperative studies, there is been resolution of the postoperative findings of subdural blood and air, with only a small amount of residual subdural fluid.  Residual tumor within the remaining right temporal lobe, inferior frontal lobe, right cerebral peduncle, right midbrain and right pons appears similar. There is no identifiable acute ischemic infarction. No hydrocephalus. No midline shift. Vascular: No acute vascular finding. Skull: Right pterional craniotomy as expected. Sinuses/Orbits: Clear/normal Other: None IMPRESSION: Expected evolutionary findings related to the previous debulking surgery in the right temporal lobe. Resolution of previously seen right subdural blood and air, with only a small amount of residual subdural low-density fluid. Residual infiltrating tumor in the right temporal lobe, inferior frontal lobe, cerebral peduncle, midbrain and pons appears quite similar to the studies of late January. No sign of acute ischemic infarction or other acute finding to explain the clinical change. Electronically Signed   By: Nelson Chimes M.D.   On: 10/27/2021 21:02    Procedures Procedures    Medications Ordered in ED Medications  acetaminophen (TYLENOL) tablet 650 mg (650 mg Oral Given 10/27/21 2137)  gadobutrol (GADAVIST) 1 MMOL/ML injection 5.5 mL (5.5 mLs Intravenous Contrast Given 10/27/21 2303)    ED Course/ Medical Decision Making/ A&P                           This patient presents to the ED for concern of left-sided weakness, this involves an extensive number of treatment options, and is a complaint that carries with it a high risk of complications and morbidity.  The differential diagnosis includes stroke, brain bleed, brain mass   Co morbidities that complicate the patient evaluation  Glioma   Additional history obtained:  Additional history obtained from husband and sister at bedside External records from outside source obtained and reviewed including recent oncology visits and prior surgery with Dr. Marcello Moores   Lab Tests:  I Ordered, and personally interpreted labs.  The pertinent results include: No leukocytosis, no  significant electrolyte derangements, normal renal function   Imaging Studies ordered:  I ordered imaging studies including CT head I independently visualized and interpreted imaging which showed postsurgical changes with residual infiltrating tumor in the right temporal region, appears similar to imaging from January I agree with the radiologist  interpretation   Cardiac Monitoring:  The patient was maintained on a cardiac monitor.  I personally viewed and interpreted the cardiac monitored which showed an underlying rhythm of: Normal sinus rhythm   Medicines ordered and prescription drug management:  I ordered medication including Decadron for potential swelling from tumor progression I have reviewed the patients home medicines and have made adjustments as needed   Consultations Obtained:  I requested consultation with the neurosurgery,  and discussed lab and imaging findings as well as pertinent plan -spoke with PA Weston Brass, recommends MRI brain with and without contrast, Decadron 4 mg every 6 hours and medicine admission, will notify Dr. Marcello Moores that his patient is being admitted, but feels patient is unlikely a surgical candidate at this time Consulted hospitalist service for admission, discussed labs, imaging and plan with Dr. Roel Cluck who will see and admit the patient  Problem List / ED Course:  Patient with left-sided weakness similar to when her right-sided glioma was initially diagnosed, concern for expansion or progression of this tumor CT with postsurgical changes in the right temporal lobe after surgery, still with some residual infiltrating tumor Consulted neurosurgery, feel that patient likely will not be a candidate for second surgery but recommend MRI with and without contrast, restarting Decadron and admission to medicine Medicine consulted for admission, will likely discuss with neurooncology once MRI has returned    Social Determinants of Health:  Language  barrier    Disposition: Admission         Final Clinical Impression(s) / ED Diagnoses Final diagnoses:  Left-sided weakness  Glioma of brain [facility])    Rx / DC Orders      Jacqlyn Larsen, PA-C 10/29/21 1426    Regan Lemming, MD 10/30/21 1157

## 2021-11-01 DIAGNOSIS — C712 Malignant neoplasm of temporal lobe: Secondary | ICD-10-CM | POA: Diagnosis not present

## 2021-11-01 DIAGNOSIS — C719 Malignant neoplasm of brain, unspecified: Secondary | ICD-10-CM | POA: Diagnosis not present

## 2021-11-01 DIAGNOSIS — Z510 Encounter for antineoplastic radiation therapy: Secondary | ICD-10-CM | POA: Diagnosis not present

## 2021-11-02 ENCOUNTER — Ambulatory Visit
Admission: RE | Admit: 2021-11-02 | Discharge: 2021-11-02 | Disposition: A | Discharge status: Home / Self Care | Payer: 59 | Source: Ambulatory Visit | Attending: Radiation Oncology | Admitting: Radiation Oncology

## 2021-11-02 ENCOUNTER — Other Ambulatory Visit: Payer: Self-pay

## 2021-11-02 DIAGNOSIS — C719 Malignant neoplasm of brain, unspecified: Secondary | ICD-10-CM | POA: Diagnosis not present

## 2021-11-02 DIAGNOSIS — Z510 Encounter for antineoplastic radiation therapy: Secondary | ICD-10-CM | POA: Diagnosis not present

## 2021-11-02 DIAGNOSIS — C712 Malignant neoplasm of temporal lobe: Secondary | ICD-10-CM | POA: Diagnosis not present

## 2021-11-03 ENCOUNTER — Ambulatory Visit
Admission: RE | Admit: 2021-11-03 | Discharge: 2021-11-03 | Disposition: A | Discharge status: Home / Self Care | Payer: 59 | Source: Ambulatory Visit | Attending: Radiation Oncology | Admitting: Radiation Oncology

## 2021-11-03 DIAGNOSIS — Z510 Encounter for antineoplastic radiation therapy: Secondary | ICD-10-CM | POA: Diagnosis not present

## 2021-11-03 DIAGNOSIS — C712 Malignant neoplasm of temporal lobe: Secondary | ICD-10-CM | POA: Diagnosis not present

## 2021-11-03 DIAGNOSIS — C719 Malignant neoplasm of brain, unspecified: Secondary | ICD-10-CM | POA: Diagnosis not present

## 2021-11-04 ENCOUNTER — Other Ambulatory Visit: Payer: Self-pay

## 2021-11-04 ENCOUNTER — Ambulatory Visit
Admission: RE | Admit: 2021-11-04 | Discharge: 2021-11-04 | Disposition: A | Discharge status: Home / Self Care | Payer: 59 | Source: Ambulatory Visit | Attending: Radiation Oncology | Admitting: Radiation Oncology

## 2021-11-04 DIAGNOSIS — C712 Malignant neoplasm of temporal lobe: Secondary | ICD-10-CM | POA: Diagnosis not present

## 2021-11-04 DIAGNOSIS — C719 Malignant neoplasm of brain, unspecified: Secondary | ICD-10-CM | POA: Diagnosis not present

## 2021-11-04 DIAGNOSIS — Z510 Encounter for antineoplastic radiation therapy: Secondary | ICD-10-CM | POA: Diagnosis not present

## 2021-11-05 ENCOUNTER — Ambulatory Visit
Admission: RE | Admit: 2021-11-05 | Discharge: 2021-11-05 | Disposition: A | Discharge status: Home / Self Care | Payer: 59 | Source: Ambulatory Visit | Attending: Radiation Oncology | Admitting: Radiation Oncology

## 2021-11-05 DIAGNOSIS — C712 Malignant neoplasm of temporal lobe: Secondary | ICD-10-CM | POA: Diagnosis not present

## 2021-11-05 DIAGNOSIS — C719 Malignant neoplasm of brain, unspecified: Secondary | ICD-10-CM | POA: Diagnosis not present

## 2021-11-05 DIAGNOSIS — Z510 Encounter for antineoplastic radiation therapy: Secondary | ICD-10-CM | POA: Diagnosis not present

## 2021-11-06 ENCOUNTER — Ambulatory Visit
Admission: RE | Admit: 2021-11-06 | Discharge: 2021-11-06 | Disposition: A | Discharge status: Home / Self Care | Payer: 59 | Source: Ambulatory Visit | Attending: Radiation Oncology | Admitting: Radiation Oncology

## 2021-11-06 ENCOUNTER — Other Ambulatory Visit: Payer: Self-pay

## 2021-11-06 DIAGNOSIS — Z510 Encounter for antineoplastic radiation therapy: Secondary | ICD-10-CM | POA: Diagnosis not present

## 2021-11-06 DIAGNOSIS — C719 Malignant neoplasm of brain, unspecified: Secondary | ICD-10-CM | POA: Diagnosis not present

## 2021-11-06 DIAGNOSIS — C712 Malignant neoplasm of temporal lobe: Secondary | ICD-10-CM | POA: Diagnosis not present

## 2021-11-06 DIAGNOSIS — G9389 Other specified disorders of brain: Secondary | ICD-10-CM

## 2021-11-06 MED ORDER — SONAFINE EX EMUL
1.0000 "application " | Freq: Once | CUTANEOUS | Status: AC
  Administered 2021-11-06: 1 via TOPICAL

## 2021-11-06 NOTE — Progress Notes (Signed)
Pt here for patient teaching.  Pt given Radiation and You booklet, skin care instructions, and Sonafine.  Reviewed areas of pertinence such as fatigue, hair loss, nausea and vomiting, skin changes, headache, and blurry vision . Pt able to give teach back of to pat skin and use unscented/gentle soap,apply Sonafine bid and avoid applying anything to skin within 4 hours of treatment. Pt verbalizes understanding of information given and will contact nursing with any questions or concerns.  Interpreter present for education.  ? ?Cristofher Livecchi M. Leonie Green, BSN  ?

## 2021-11-09 ENCOUNTER — Ambulatory Visit
Admission: RE | Admit: 2021-11-09 | Discharge: 2021-11-09 | Disposition: A | Discharge status: Home / Self Care | Payer: 59 | Source: Ambulatory Visit | Attending: Radiation Oncology | Admitting: Radiation Oncology

## 2021-11-09 DIAGNOSIS — C719 Malignant neoplasm of brain, unspecified: Secondary | ICD-10-CM | POA: Diagnosis not present

## 2021-11-09 DIAGNOSIS — Z510 Encounter for antineoplastic radiation therapy: Secondary | ICD-10-CM | POA: Diagnosis not present

## 2021-11-09 DIAGNOSIS — C712 Malignant neoplasm of temporal lobe: Secondary | ICD-10-CM | POA: Diagnosis not present

## 2021-11-10 ENCOUNTER — Ambulatory Visit
Admission: RE | Admit: 2021-11-10 | Discharge: 2021-11-10 | Disposition: A | Discharge status: Home / Self Care | Payer: 59 | Source: Ambulatory Visit | Attending: Radiation Oncology | Admitting: Radiation Oncology

## 2021-11-10 ENCOUNTER — Other Ambulatory Visit: Payer: Self-pay

## 2021-11-10 DIAGNOSIS — Z510 Encounter for antineoplastic radiation therapy: Secondary | ICD-10-CM | POA: Diagnosis not present

## 2021-11-10 DIAGNOSIS — C712 Malignant neoplasm of temporal lobe: Secondary | ICD-10-CM | POA: Diagnosis not present

## 2021-11-10 DIAGNOSIS — C719 Malignant neoplasm of brain, unspecified: Secondary | ICD-10-CM | POA: Diagnosis not present

## 2021-11-11 ENCOUNTER — Ambulatory Visit
Admission: RE | Admit: 2021-11-11 | Discharge: 2021-11-11 | Disposition: A | Discharge status: Home / Self Care | Payer: 59 | Source: Ambulatory Visit | Attending: Radiation Oncology | Admitting: Radiation Oncology

## 2021-11-11 DIAGNOSIS — C712 Malignant neoplasm of temporal lobe: Secondary | ICD-10-CM | POA: Diagnosis not present

## 2021-11-11 DIAGNOSIS — Z510 Encounter for antineoplastic radiation therapy: Secondary | ICD-10-CM | POA: Diagnosis not present

## 2021-11-11 DIAGNOSIS — C719 Malignant neoplasm of brain, unspecified: Secondary | ICD-10-CM | POA: Diagnosis not present

## 2021-11-12 ENCOUNTER — Inpatient Hospital Stay: Payer: 59 | Attending: Internal Medicine

## 2021-11-12 ENCOUNTER — Inpatient Hospital Stay (HOSPITAL_BASED_OUTPATIENT_CLINIC_OR_DEPARTMENT_OTHER): Payer: 59 | Admitting: Internal Medicine

## 2021-11-12 ENCOUNTER — Other Ambulatory Visit: Payer: Self-pay

## 2021-11-12 ENCOUNTER — Ambulatory Visit
Admission: RE | Admit: 2021-11-12 | Discharge: 2021-11-12 | Disposition: A | Discharge status: Home / Self Care | Payer: 59 | Source: Ambulatory Visit | Attending: Radiation Oncology | Admitting: Radiation Oncology

## 2021-11-12 VITALS — BP 130/90 | HR 54 | Temp 98.2°F | Resp 17 | Wt 116.7 lb

## 2021-11-12 DIAGNOSIS — R531 Weakness: Secondary | ICD-10-CM | POA: Diagnosis not present

## 2021-11-12 DIAGNOSIS — C712 Malignant neoplasm of temporal lobe: Secondary | ICD-10-CM | POA: Diagnosis not present

## 2021-11-12 DIAGNOSIS — C719 Malignant neoplasm of brain, unspecified: Secondary | ICD-10-CM | POA: Diagnosis not present

## 2021-11-12 DIAGNOSIS — Z510 Encounter for antineoplastic radiation therapy: Secondary | ICD-10-CM | POA: Diagnosis not present

## 2021-11-12 LAB — CMP (CANCER CENTER ONLY)
ALT: 36 U/L (ref 0–44)
AST: 11 U/L — ABNORMAL LOW (ref 15–41)
Albumin: 4 g/dL (ref 3.5–5.0)
Alkaline Phosphatase: 66 U/L (ref 38–126)
Anion gap: 6 (ref 5–15)
BUN: 19 mg/dL (ref 6–20)
CO2: 27 mmol/L (ref 22–32)
Calcium: 9.1 mg/dL (ref 8.9–10.3)
Chloride: 99 mmol/L (ref 98–111)
Creatinine: 0.46 mg/dL (ref 0.44–1.00)
GFR, Estimated: >60 mL/min (ref >60)
Glucose, Bld: 110 mg/dL — ABNORMAL HIGH (ref 70–99)
Potassium: 4 mmol/L (ref 3.5–5.1)
Sodium: 132 mmol/L — ABNORMAL LOW (ref 135–145)
Total Bilirubin: 0.6 mg/dL (ref 0.3–1.2)
Total Protein: 6.4 g/dL — ABNORMAL LOW (ref 6.5–8.1)

## 2021-11-12 LAB — CBC WITH DIFFERENTIAL (CANCER CENTER ONLY)
Abs Immature Granulocytes: 0.05 10*3/uL (ref 0.00–0.07)
Basophils Absolute: 0 10*3/uL (ref 0.0–0.1)
Basophils Relative: 0 %
Eosinophils Absolute: 0 10*3/uL (ref 0.0–0.5)
Eosinophils Relative: 0 %
HCT: 38.5 % (ref 36.0–46.0)
Hemoglobin: 13.4 g/dL (ref 12.0–15.0)
Immature Granulocytes: 1 %
Lymphocytes Relative: 7 %
Lymphs Abs: 0.5 10*3/uL — ABNORMAL LOW (ref 0.7–4.0)
MCH: 30.7 pg (ref 26.0–34.0)
MCHC: 34.8 g/dL (ref 30.0–36.0)
MCV: 88.3 fL (ref 80.0–100.0)
Monocytes Absolute: 0.4 10*3/uL (ref 0.1–1.0)
Monocytes Relative: 6 %
Neutro Abs: 6.6 10*3/uL (ref 1.7–7.7)
Neutrophils Relative %: 86 %
Platelet Count: 194 10*3/uL (ref 150–400)
RBC: 4.36 MIL/uL (ref 3.87–5.11)
RDW: 14.6 % (ref 11.5–15.5)
WBC Count: 7.7 10*3/uL (ref 4.0–10.5)
nRBC: 0 % (ref 0.0–0.2)

## 2021-11-12 NOTE — Progress Notes (Signed)
? ?[facility] [address]  ?[place], [place] [address] ?[phone] ? ? ?Interval Evaluation ? ?Date of Service: 11/12/21 ?Patient Name: Patricia Stevenson ?Patient MRN: [id number] ?Patient DOB: 19-Jul-1961 ?Provider: Ventura Sellers, MD ? ?Identifying Statement:  ?Patricia Stevenson is a 61 y.o. female with right temporal  high grade glioma   ? ?Oncologic History: ?Oncology History  ?High grade glioma not classifiable by WHO criteria ([place])  ?09/15/2021 Initial Diagnosis  ? High grade glioma not classifiable by WHO criteria [facility]) ?  ?09/15/2021 Cancer Staging  ? Staging form: Brain and Spinal Cord, AJCC Version 9 ?- Clinical stage from 09/15/2021: No stage assigned - Signed by Ventura Sellers, MD on 10/08/2021 ?Histopathologic type: Astrocytoma, NOS ?Stage prefix: Initial diagnosis ? ?  ?10/19/2021 -  Chemotherapy  ? Patient is on Treatment Plan : BRAIN GLIOBLASTOMA Radiation Therapy With Concurrent Temozolomide 75 mg/m2 Daily Followed By Sequential Maintenance Temozolomide x 6-12 cycles  ?   ? ? ?Biomarkers: ? ?MGMT Unknown.  ?IDH 1/2 Wild type.  ?EGFR Unknown  ?TERT Unknown  ? ?Interval History: ?Patricia Stevenson presents for follow up, now having completed 2 weeks of IMRT and Temodar.  She is handling treatment well thus far.  Continues to experience left sided weakness, but significantly improved since the decadron was started during hospitalization prior to radiation.  Currently dosing 71m twice per day.  Walking independently, not relying on family for help with basic ADLs. No further seizures. ? ?Medications: ?Current Outpatient Medications on File Prior to Visit  ?Medication Sig Dispense Refill  ? acetaminophen (TYLENOL) 500 MG tablet Take 1,000 mg by mouth every 6 (six) hours as needed for moderate pain or headache.    ? Ascorbic Acid (VITA-C PO) Take 500 mg by mouth daily. Unsure of dose    ? calcium carbonate (OSCAL) 1500 (600 Ca) MG TABS tablet Take 1,500 mg by mouth 2 (two) times  daily with a meal.    ? dexamethasone (DECADRON) 2 MG tablet Take 1 tablet (2 mg total) by mouth 2 (two) times daily with a meal. 60 tablet 0  ? dicyclomine (BENTYL) 10 MG capsule Take 1 capsule (10 mg total) by mouth in the morning and at bedtime. As needed (Patient taking differently: Take 10 mg by mouth 2 (two) times daily as needed for spasms.) 90 capsule 0  ? diltiazem (CARDIZEM CD) 120 MG 24 hr capsule Take 1 capsule (120 mg total) by mouth daily. 60 capsule 3  ? famotidine (PEPCID) 20 MG tablet Take 1 tablet (20 mg total) by mouth 2 (two) times daily. 60 tablet 3  ? levETIRAcetam (KEPPRA) 500 MG tablet Take 1 tablet (500 mg total) by mouth 2 (two) times daily. 60 tablet 2  ? ondansetron (ZOFRAN) 8 MG tablet Take 1 tablet (8 mg total) by mouth 2 (two) times daily as needed (nausea and vomiting). May take 30-60 minutes prior to Temodar administration if nausea/vomiting occurs. 30 tablet 1  ? temozolomide (TEMODAR) 100 MG capsule Take 1 capsule (100 mg total) by mouth daily. May take on an empty stomach to decrease nausea & vomiting. 42 capsule 0  ? VITAMIN D PO Take 1,000 Units by mouth daily.    ? ?No current facility-administered medications on file prior to visit.  ? ? ?Allergies:  ?Allergies  ?Allergen Reactions  ? Latex   ? ?Past Medical History:  ?Past Medical History:  ?Diagnosis Date  ? GERD (gastroesophageal reflux disease)   ?  Hyperlipidemia   ? Vitamin D deficiency   ? ?Past Surgical History:  ?Past Surgical History:  ?Procedure Laterality Date  ? APPLICATION OF CRANIAL NAVIGATION Right 09/15/2021  ? Procedure: APPLICATION OF CRANIAL NAVIGATION;  Surgeon: Vallarie Mare, MD;  Location: [facility];  Service: Neurosurgery;  Laterality: Right;  ? BREAST BIOPSY Left   ? 20 years ago in [place]- benign- not sure if bx or cyst  ? CHOLECYSTECTOMY    ? Laparoscopic cholecystectomy 04/29/2021  ? CRANIOTOMY Right 09/15/2021  ? Procedure: Right Craniotomy Temporal Lobectomy with Brain Lab;  Surgeon: Vallarie Mare, MD;  Location: [facility];  Service: Neurosurgery;  Laterality: Right;  ? ?Social History:  ?Social History  ? ?Socioeconomic History  ? Marital status: Married  ?  Spouse name: Not on file  ? Number of children: Not on file  ? Years of education: Not on file  ? Highest education level: Not on file  ?Occupational History  ? Not on file  ?Tobacco Use  ? Smoking status: Never  ? Smokeless tobacco: Never  ?Vaping Use  ? Vaping Use: Not on file  ?Substance and Sexual Activity  ? Alcohol use: No  ? Drug use: No  ? Sexual activity: Not on file  ?Other Topics Concern  ? Not on file  ?Social History Narrative  ? Not on file  ? ?Social Determinants of Health  ? ?Financial Resource Strain: Not on file  ?Food Insecurity: Not on file  ?Transportation Needs: Not on file  ?Physical Activity: Not on file  ?Stress: Not on file  ?Social Connections: Not on file  ?Intimate Partner Violence: Not on file  ? ?Family History:  ?Family History  ?Problem Relation Age of Onset  ? Breast cancer Neg Hx   ? CAD Neg Hx   ? ? ?Review of Systems: ?Constitutional: Doesn't report fevers, chills or abnormal weight loss ?Eyes: Doesn't report blurriness of vision ?Ears, nose, mouth, throat, and face: Doesn't report sore throat ?Respiratory: Doesn't report cough, dyspnea or wheezes ?Cardiovascular: Doesn't report palpitation, chest discomfort  ?Gastrointestinal:  Doesn't report nausea, constipation, diarrhea ?GU: Doesn't report incontinence ?Skin: Doesn't report skin rashes ?Neurological: Per HPI ?Musculoskeletal: Doesn't report joint pain ?Behavioral/Psych: Doesn't report anxiety ? ?Physical Exam: ?Vitals:  ? 11/12/21 1223  ?BP: 130/90  ?Pulse: (!) 54  ?Resp: 17  ?Temp: 98.2 ?F (36.8 ?C)  ?SpO2: 100%  ? ?KPS: 70. ?General: Alert, cooperative, pleasant, in no acute distress ?Head: Normal ?EENT: No conjunctival injection or scleral icterus.  ?Lungs: Resp effort normal ?Cardiac: Regular rate ?Abdomen: Non-distended abdomen ?Skin: No rashes  cyanosis or petechiae. ?Extremities: No clubbing or edema ? ?Neurologic Exam: ?Mental Status: Awake, alert, attentive to examiner. Oriented to self and environment. Language is fluent with intact comprehension.  ?Cranial Nerves: Visual acuity is grossly normal. Visual fields are full. 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 Face is symmetric ?Motor: Tone and bulk are normal. Power is 4+/5 in left arm and leg with noted drift. Reflexes are symmetric, no pathologic reflexes present.  ?Sensory: Intact to light touch ?Gait: Hemiparetic ? ? ?Labs: ?I have reviewed the data as listed ?   ?Component Value Date/Time  ? NA 132 (L) 11/12/2021 1137  ? K 4.0 11/12/2021 1137  ? CL 99 11/12/2021 1137  ? CO2 27 11/12/2021 1137  ? GLUCOSE 110 (H) 11/12/2021 1137  ? BUN 19 11/12/2021 1137  ? CREATININE 0.46 11/12/2021 1137  ? CREATININE 0.66 05/21/2020 0811  ? CALCIUM 9.1 11/12/2021  1137  ? PROT 6.4 (L) 11/12/2021 1137  ? ALBUMIN 4.0 11/12/2021 1137  ? AST 11 (L) 11/12/2021 1137  ? ALT 36 11/12/2021 1137  ? ALKPHOS 66 11/12/2021 1137  ? BILITOT 0.6 11/12/2021 1137  ? GFRNONAA >60 11/12/2021 1137  ? ?Lab Results  ?Component Value Date  ? WBC 7.7 11/12/2021  ? NEUTROABS 6.6 11/12/2021  ? HGB 13.4 11/12/2021  ? HCT 38.5 11/12/2021  ? MCV 88.3 11/12/2021  ? PLT 194 11/12/2021  ? ? ?Imaging: ? ?CT Head Wo Contrast ? ?Result Date: 10/27/2021 ?CLINICAL DATA:  Brain/CNS neoplasm. Assess treatment response. Left-sided weakness. Right-sided glioma. Three day history of worsening left-sided weakness. EXAM: CT HEAD WITHOUT CONTRAST TECHNIQUE: Contiguous axial images were obtained from the base of the skull through the vertex without intravenous contrast. RADIATION DOSE REDUCTION: This exam was performed according to the departmental dose-optimization program which includes automated exposure control, adjustment of the mA and/or kV according to patient size and/or use of iterative reconstruction technique. COMPARISON:  MRI 09/16/2021.   CT 09/15/2021. FINDINGS: Brain: Previous right pterional craniotomy for debulking of a large infiltrating malignant brain mass of the right hemisphere and brainstem. Since the postoperative studies, there is been

## 2021-11-13 ENCOUNTER — Other Ambulatory Visit: Payer: Self-pay

## 2021-11-13 ENCOUNTER — Ambulatory Visit
Admission: RE | Admit: 2021-11-13 | Discharge: 2021-11-13 | Disposition: A | Discharge status: Home / Self Care | Payer: 59 | Source: Ambulatory Visit | Attending: Radiation Oncology | Admitting: Radiation Oncology

## 2021-11-13 DIAGNOSIS — C719 Malignant neoplasm of brain, unspecified: Secondary | ICD-10-CM | POA: Diagnosis not present

## 2021-11-13 DIAGNOSIS — C712 Malignant neoplasm of temporal lobe: Secondary | ICD-10-CM | POA: Diagnosis not present

## 2021-11-13 DIAGNOSIS — Z510 Encounter for antineoplastic radiation therapy: Secondary | ICD-10-CM | POA: Diagnosis not present

## 2021-11-16 ENCOUNTER — Other Ambulatory Visit: Payer: Self-pay

## 2021-11-16 ENCOUNTER — Other Ambulatory Visit: Payer: Self-pay | Admitting: Radiation Oncology

## 2021-11-16 ENCOUNTER — Ambulatory Visit
Admission: RE | Admit: 2021-11-16 | Discharge: 2021-11-16 | Disposition: A | Discharge status: Home / Self Care | Payer: 59 | Source: Ambulatory Visit | Attending: Radiation Oncology | Admitting: Radiation Oncology

## 2021-11-16 DIAGNOSIS — Z510 Encounter for antineoplastic radiation therapy: Secondary | ICD-10-CM | POA: Diagnosis not present

## 2021-11-16 DIAGNOSIS — C712 Malignant neoplasm of temporal lobe: Secondary | ICD-10-CM | POA: Diagnosis not present

## 2021-11-16 DIAGNOSIS — C719 Malignant neoplasm of brain, unspecified: Secondary | ICD-10-CM | POA: Diagnosis not present

## 2021-11-16 MED ORDER — NYSTATIN 100000 UNIT/ML MT SUSP: 5.0000 mL | Freq: Four times a day (QID) | OROMUCOSAL | 0 refills | Status: DC

## 2021-11-17 ENCOUNTER — Ambulatory Visit
Admission: RE | Admit: 2021-11-17 | Discharge: 2021-11-17 | Disposition: A | Discharge status: Home / Self Care | Payer: 59 | Source: Ambulatory Visit | Attending: Radiation Oncology | Admitting: Radiation Oncology

## 2021-11-17 DIAGNOSIS — C712 Malignant neoplasm of temporal lobe: Secondary | ICD-10-CM | POA: Diagnosis not present

## 2021-11-17 DIAGNOSIS — C719 Malignant neoplasm of brain, unspecified: Secondary | ICD-10-CM | POA: Diagnosis not present

## 2021-11-17 DIAGNOSIS — Z510 Encounter for antineoplastic radiation therapy: Secondary | ICD-10-CM | POA: Diagnosis not present

## 2021-11-18 ENCOUNTER — Other Ambulatory Visit: Payer: Self-pay

## 2021-11-18 ENCOUNTER — Ambulatory Visit
Admission: RE | Admit: 2021-11-18 | Discharge: 2021-11-18 | Disposition: A | Discharge status: Home / Self Care | Payer: 59 | Source: Ambulatory Visit | Attending: Radiation Oncology | Admitting: Radiation Oncology

## 2021-11-18 DIAGNOSIS — Z510 Encounter for antineoplastic radiation therapy: Secondary | ICD-10-CM | POA: Diagnosis not present

## 2021-11-18 DIAGNOSIS — C712 Malignant neoplasm of temporal lobe: Secondary | ICD-10-CM | POA: Diagnosis not present

## 2021-11-18 DIAGNOSIS — C719 Malignant neoplasm of brain, unspecified: Secondary | ICD-10-CM | POA: Diagnosis not present

## 2021-11-19 ENCOUNTER — Ambulatory Visit
Admission: RE | Admit: 2021-11-19 | Discharge: 2021-11-19 | Disposition: A | Discharge status: Home / Self Care | Payer: 59 | Source: Ambulatory Visit | Attending: Radiation Oncology | Admitting: Radiation Oncology

## 2021-11-19 DIAGNOSIS — C712 Malignant neoplasm of temporal lobe: Secondary | ICD-10-CM | POA: Diagnosis not present

## 2021-11-19 DIAGNOSIS — Z510 Encounter for antineoplastic radiation therapy: Secondary | ICD-10-CM | POA: Diagnosis not present

## 2021-11-19 DIAGNOSIS — C719 Malignant neoplasm of brain, unspecified: Secondary | ICD-10-CM | POA: Diagnosis not present

## 2021-11-20 ENCOUNTER — Other Ambulatory Visit: Payer: Self-pay | Admitting: Radiation Oncology

## 2021-11-20 ENCOUNTER — Ambulatory Visit
Admission: RE | Admit: 2021-11-20 | Discharge: 2021-11-20 | Disposition: A | Discharge status: Home / Self Care | Payer: 59 | Source: Ambulatory Visit | Attending: Radiation Oncology | Admitting: Radiation Oncology

## 2021-11-20 DIAGNOSIS — C719 Malignant neoplasm of brain, unspecified: Secondary | ICD-10-CM | POA: Diagnosis not present

## 2021-11-20 DIAGNOSIS — Z510 Encounter for antineoplastic radiation therapy: Secondary | ICD-10-CM | POA: Diagnosis not present

## 2021-11-20 DIAGNOSIS — C712 Malignant neoplasm of temporal lobe: Secondary | ICD-10-CM | POA: Diagnosis not present

## 2021-11-20 MED ORDER — NYSTATIN 100000 UNIT/ML MT SUSP: 5.0000 mL | Freq: Four times a day (QID) | OROMUCOSAL | 0 refills | Status: DC

## 2021-11-23 ENCOUNTER — Other Ambulatory Visit: Payer: Self-pay

## 2021-11-23 ENCOUNTER — Ambulatory Visit
Admission: RE | Admit: 2021-11-23 | Discharge: 2021-11-23 | Disposition: A | Discharge status: Home / Self Care | Payer: 59 | Source: Ambulatory Visit | Attending: Radiation Oncology | Admitting: Radiation Oncology

## 2021-11-23 DIAGNOSIS — Z510 Encounter for antineoplastic radiation therapy: Secondary | ICD-10-CM | POA: Insufficient documentation

## 2021-11-23 DIAGNOSIS — C712 Malignant neoplasm of temporal lobe: Secondary | ICD-10-CM | POA: Insufficient documentation

## 2021-11-23 DIAGNOSIS — C719 Malignant neoplasm of brain, unspecified: Secondary | ICD-10-CM | POA: Diagnosis not present

## 2021-11-23 DIAGNOSIS — Z9221 Personal history of antineoplastic chemotherapy: Secondary | ICD-10-CM | POA: Diagnosis not present

## 2021-11-23 DIAGNOSIS — D72819 Decreased white blood cell count, unspecified: Secondary | ICD-10-CM | POA: Diagnosis not present

## 2021-11-23 DIAGNOSIS — E559 Vitamin D deficiency, unspecified: Secondary | ICD-10-CM | POA: Diagnosis not present

## 2021-11-23 DIAGNOSIS — B379 Candidiasis, unspecified: Secondary | ICD-10-CM | POA: Diagnosis not present

## 2021-11-23 DIAGNOSIS — R609 Edema, unspecified: Secondary | ICD-10-CM | POA: Diagnosis not present

## 2021-11-23 DIAGNOSIS — Z79899 Other long term (current) drug therapy: Secondary | ICD-10-CM | POA: Diagnosis not present

## 2021-11-23 DIAGNOSIS — Z923 Personal history of irradiation: Secondary | ICD-10-CM | POA: Diagnosis not present

## 2021-11-23 DIAGNOSIS — R0602 Shortness of breath: Secondary | ICD-10-CM | POA: Diagnosis not present

## 2021-11-23 DIAGNOSIS — K219 Gastro-esophageal reflux disease without esophagitis: Secondary | ICD-10-CM | POA: Diagnosis not present

## 2021-11-23 DIAGNOSIS — D696 Thrombocytopenia, unspecified: Secondary | ICD-10-CM | POA: Diagnosis not present

## 2021-11-23 DIAGNOSIS — E785 Hyperlipidemia, unspecified: Secondary | ICD-10-CM | POA: Diagnosis not present

## 2021-11-24 ENCOUNTER — Ambulatory Visit
Admission: RE | Admit: 2021-11-24 | Discharge: 2021-11-24 | Disposition: A | Discharge status: Home / Self Care | Payer: 59 | Source: Ambulatory Visit | Attending: Radiation Oncology | Admitting: Radiation Oncology

## 2021-11-24 ENCOUNTER — Inpatient Hospital Stay: Payer: 59 | Attending: Internal Medicine

## 2021-11-24 ENCOUNTER — Inpatient Hospital Stay (HOSPITAL_BASED_OUTPATIENT_CLINIC_OR_DEPARTMENT_OTHER): Payer: 59 | Admitting: Internal Medicine

## 2021-11-24 VITALS — BP 125/56 | HR 55 | Temp 97.7°F | Resp 20 | Wt 117.4 lb

## 2021-11-24 DIAGNOSIS — C719 Malignant neoplasm of brain, unspecified: Secondary | ICD-10-CM

## 2021-11-24 DIAGNOSIS — C712 Malignant neoplasm of temporal lobe: Secondary | ICD-10-CM | POA: Diagnosis not present

## 2021-11-24 DIAGNOSIS — B379 Candidiasis, unspecified: Secondary | ICD-10-CM | POA: Insufficient documentation

## 2021-11-24 DIAGNOSIS — Z923 Personal history of irradiation: Secondary | ICD-10-CM | POA: Insufficient documentation

## 2021-11-24 DIAGNOSIS — R0602 Shortness of breath: Secondary | ICD-10-CM | POA: Insufficient documentation

## 2021-11-24 DIAGNOSIS — Z510 Encounter for antineoplastic radiation therapy: Secondary | ICD-10-CM | POA: Insufficient documentation

## 2021-11-24 DIAGNOSIS — K219 Gastro-esophageal reflux disease without esophagitis: Secondary | ICD-10-CM | POA: Insufficient documentation

## 2021-11-24 DIAGNOSIS — D696 Thrombocytopenia, unspecified: Secondary | ICD-10-CM | POA: Insufficient documentation

## 2021-11-24 DIAGNOSIS — E559 Vitamin D deficiency, unspecified: Secondary | ICD-10-CM | POA: Insufficient documentation

## 2021-11-24 DIAGNOSIS — D72819 Decreased white blood cell count, unspecified: Secondary | ICD-10-CM | POA: Insufficient documentation

## 2021-11-24 DIAGNOSIS — Z9221 Personal history of antineoplastic chemotherapy: Secondary | ICD-10-CM | POA: Insufficient documentation

## 2021-11-24 DIAGNOSIS — Z79899 Other long term (current) drug therapy: Secondary | ICD-10-CM | POA: Insufficient documentation

## 2021-11-24 DIAGNOSIS — R609 Edema, unspecified: Secondary | ICD-10-CM | POA: Insufficient documentation

## 2021-11-24 DIAGNOSIS — E785 Hyperlipidemia, unspecified: Secondary | ICD-10-CM | POA: Insufficient documentation

## 2021-11-24 LAB — CBC WITH DIFFERENTIAL (CANCER CENTER ONLY)
Abs Immature Granulocytes: 0.04 10*3/uL (ref 0.00–0.07)
Basophils Absolute: 0 10*3/uL (ref 0.0–0.1)
Basophils Relative: 0 %
Eosinophils Absolute: 0 10*3/uL (ref 0.0–0.5)
Eosinophils Relative: 0 %
HCT: 39.8 % (ref 36.0–46.0)
Hemoglobin: 13.6 g/dL (ref 12.0–15.0)
Immature Granulocytes: 1 %
Lymphocytes Relative: 5 %
Lymphs Abs: 0.2 10*3/uL — ABNORMAL LOW (ref 0.7–4.0)
MCH: 31.1 pg (ref 26.0–34.0)
MCHC: 34.2 g/dL (ref 30.0–36.0)
MCV: 90.9 fL (ref 80.0–100.0)
Monocytes Absolute: 0.2 10*3/uL (ref 0.1–1.0)
Monocytes Relative: 5 %
Neutro Abs: 4.3 10*3/uL (ref 1.7–7.7)
Neutrophils Relative %: 89 %
Platelet Count: 153 10*3/uL (ref 150–400)
RBC: 4.38 MIL/uL (ref 3.87–5.11)
RDW: 15.8 % — ABNORMAL HIGH (ref 11.5–15.5)
WBC Count: 4.8 10*3/uL (ref 4.0–10.5)
nRBC: 0 % (ref 0.0–0.2)

## 2021-11-24 LAB — CMP (CANCER CENTER ONLY)
ALT: 26 U/L (ref 0–44)
AST: 10 U/L — ABNORMAL LOW (ref 15–41)
Albumin: 3.9 g/dL (ref 3.5–5.0)
Alkaline Phosphatase: 53 U/L (ref 38–126)
Anion gap: 7 (ref 5–15)
BUN: 20 mg/dL (ref 6–20)
CO2: 27 mmol/L (ref 22–32)
Calcium: 8.9 mg/dL (ref 8.9–10.3)
Chloride: 98 mmol/L (ref 98–111)
Creatinine: 0.41 mg/dL — ABNORMAL LOW (ref 0.44–1.00)
GFR, Estimated: >60 mL/min (ref >60)
Glucose, Bld: 113 mg/dL — ABNORMAL HIGH (ref 70–99)
Potassium: 4.2 mmol/L (ref 3.5–5.1)
Sodium: 132 mmol/L — ABNORMAL LOW (ref 135–145)
Total Bilirubin: 0.5 mg/dL (ref 0.3–1.2)
Total Protein: 6.4 g/dL — ABNORMAL LOW (ref 6.5–8.1)

## 2021-11-24 NOTE — Progress Notes (Signed)
? ?[facility] [address]  ?[place], [place] [address] ?[phone] ? ? ?Interval Evaluation ? ?Date of Service: 11/24/21 ?Patient Name: Patricia Stevenson ?Patient MRN: [id number] ?Patient DOB: 09-17-1960 ?Provider: Ventura Sellers, MD ? ?Identifying Statement:  ?Patricia Stevenson is a 61 y.o. female with right temporal  high grade glioma   ? ?Oncologic History: ?Oncology History  ?High grade glioma not classifiable by WHO criteria ([place])  ?09/15/2021 Initial Diagnosis  ? High grade glioma not classifiable by WHO criteria [facility]) ?  ?09/15/2021 Cancer Staging  ? Staging form: Brain and Spinal Cord, AJCC Version 9 ?- Clinical stage from 09/15/2021: No stage assigned - Signed by Ventura Sellers, MD on 10/08/2021 ?Histopathologic type: Astrocytoma, NOS ?Stage prefix: Initial diagnosis ? ?  ?10/19/2021 -  Chemotherapy  ? Patient is on Treatment Plan : BRAIN GLIOBLASTOMA Radiation Therapy With Concurrent Temozolomide 75 mg/m2 Daily Followed By Sequential Maintenance Temozolomide x 6-12 cycles  ?   ? ? ?Biomarkers: ? ?MGMT Unknown.  ?IDH 1/2 Wild type.  ?EGFR Unknown  ?TERT Unknown  ? ?Interval History: ?Patricia Stevenson presents for follow up, now having completed 4 weeks of IMRT and Temodar.  No significant issues with the chemo.  Left sided weakness is stable. Decadron is at 58m/1mg.  Walking independently, not relying on family for help with basic ADLs. No further seizures. ? ?Medications: ?Current Outpatient Medications on File Prior to Visit  ?Medication Sig Dispense Refill  ? acetaminophen (TYLENOL) 500 MG tablet Take 1,000 mg by mouth every 6 (six) hours as needed for moderate pain or headache.    ? Ascorbic Acid (VITA-C PO) Take 500 mg by mouth daily. Unsure of dose    ? calcium carbonate (OSCAL) 1500 (600 Ca) MG TABS tablet Take 1,500 mg by mouth 2 (two) times daily with a meal.    ? dexamethasone (DECADRON) 2 MG tablet Take 1 tablet (2 mg total) by mouth 2 (two) times daily with a meal. 60  tablet 0  ? dicyclomine (BENTYL) 10 MG capsule Take 1 capsule (10 mg total) by mouth in the morning and at bedtime. As needed (Patient taking differently: Take 10 mg by mouth 2 (two) times daily as needed for spasms.) 90 capsule 0  ? diltiazem (CARDIZEM CD) 120 MG 24 hr capsule Take 1 capsule (120 mg total) by mouth daily. 60 capsule 3  ? famotidine (PEPCID) 20 MG tablet Take 1 tablet (20 mg total) by mouth 2 (two) times daily. 60 tablet 3  ? levETIRAcetam (KEPPRA) 500 MG tablet Take 1 tablet (500 mg total) by mouth 2 (two) times daily. 60 tablet 2  ? nystatin (MYCOSTATIN) 100000 UNIT/ML suspension Take 5 mLs (500,000 Units total) by mouth 4 (four) times daily. 200 mL 0  ? ondansetron (ZOFRAN) 8 MG tablet Take 1 tablet (8 mg total) by mouth 2 (two) times daily as needed (nausea and vomiting). May take 30-60 minutes prior to Temodar administration if nausea/vomiting occurs. 30 tablet 1  ? temozolomide (TEMODAR) 100 MG capsule Take 1 capsule (100 mg total) by mouth daily. May take on an empty stomach to decrease nausea & vomiting. 42 capsule 0  ? VITAMIN D PO Take 1,000 Units by mouth daily.    ? ?No current facility-administered medications on file prior to visit.  ? ? ?Allergies:  ?Allergies  ?Allergen Reactions  ? Latex   ? ?Past Medical History:  ?Past Medical History:  ?Diagnosis Date  ? GERD (gastroesophageal  reflux disease)   ? Hyperlipidemia   ? Vitamin D deficiency   ? ?Past Surgical History:  ?Past Surgical History:  ?Procedure Laterality Date  ? APPLICATION OF CRANIAL NAVIGATION Right 09/15/2021  ? Procedure: APPLICATION OF CRANIAL NAVIGATION;  Surgeon: Vallarie Mare, MD;  Location: [facility];  Service: Neurosurgery;  Laterality: Right;  ? BREAST BIOPSY Left   ? 20 years ago in [place]- benign- not sure if bx or cyst  ? CHOLECYSTECTOMY    ? Laparoscopic cholecystectomy 04/29/2021  ? CRANIOTOMY Right 09/15/2021  ? Procedure: Right Craniotomy Temporal Lobectomy with Brain Lab;  Surgeon: Vallarie Mare,  MD;  Location: [facility];  Service: Neurosurgery;  Laterality: Right;  ? ?Social History:  ?Social History  ? ?Socioeconomic History  ? Marital status: Married  ?  Spouse name: Not on file  ? Number of children: Not on file  ? Years of education: Not on file  ? Highest education level: Not on file  ?Occupational History  ? Not on file  ?Tobacco Use  ? Smoking status: Never  ? Smokeless tobacco: Never  ?Vaping Use  ? Vaping Use: Not on file  ?Substance and Sexual Activity  ? Alcohol use: No  ? Drug use: No  ? Sexual activity: Not on file  ?Other Topics Concern  ? Not on file  ?Social History Narrative  ? Not on file  ? ?Social Determinants of Health  ? ?Financial Resource Strain: Not on file  ?Food Insecurity: Not on file  ?Transportation Needs: Not on file  ?Physical Activity: Not on file  ?Stress: Not on file  ?Social Connections: Not on file  ?Intimate Partner Violence: Not on file  ? ?Family History:  ?Family History  ?Problem Relation Age of Onset  ? Breast cancer Neg Hx   ? CAD Neg Hx   ? ? ?Review of Systems: ?Constitutional: Doesn't report fevers, chills or abnormal weight loss ?Eyes: Doesn't report blurriness of vision ?Ears, nose, mouth, throat, and face: Doesn't report sore throat ?Respiratory: Doesn't report cough, dyspnea or wheezes ?Cardiovascular: Doesn't report palpitation, chest discomfort  ?Gastrointestinal:  Doesn't report nausea, constipation, diarrhea ?GU: Doesn't report incontinence ?Skin: Doesn't report skin rashes ?Neurological: Per HPI ?Musculoskeletal: Doesn't report joint pain ?Behavioral/Psych: Doesn't report anxiety ? ?Physical Exam: ?Vitals:  ? 11/24/21 1135  ?BP: (!) 125/56  ?Pulse: (!) 55  ?Resp: 20  ?Temp: 97.7 ?F (36.5 ?C)  ?SpO2: 100%  ? ?KPS: 70. ?General: Alert, cooperative, pleasant, in no acute distress ?Head: Normal ?EENT: No conjunctival injection or scleral icterus.  ?Lungs: Resp effort normal ?Cardiac: Regular rate ?Abdomen: Non-distended abdomen ?Skin: No rashes cyanosis or  petechiae. ?Extremities: No clubbing or edema ? ?Neurologic Exam: ?Mental Status: Awake, alert, attentive to examiner. Oriented to self and environment. Language is fluent with intact comprehension.  ?Cranial Nerves: Visual acuity is grossly normal. Visual fields are full. 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 Face is symmetric ?Motor: Tone and bulk are normal. Power is 4+/5 in left arm and leg with noted drift. Reflexes are symmetric, no pathologic reflexes present.  ?Sensory: Intact to light touch ?Gait: Hemiparetic ? ? ?Labs: ?I have reviewed the data as listed ?   ?Component Value Date/Time  ? NA 132 (L) 11/24/2021 1109  ? K 4.2 11/24/2021 1109  ? CL 98 11/24/2021 1109  ? CO2 27 11/24/2021 1109  ? GLUCOSE 113 (H) 11/24/2021 1109  ? BUN 20 11/24/2021 1109  ? CREATININE 0.41 (L) 11/24/2021 1109  ? CREATININE 0.66  05/21/2020 0947  ? CALCIUM 8.9 11/24/2021 1109  ? PROT 6.4 (L) 11/24/2021 1109  ? ALBUMIN 3.9 11/24/2021 1109  ? AST 10 (L) 11/24/2021 1109  ? ALT 26 11/24/2021 1109  ? ALKPHOS 53 11/24/2021 1109  ? BILITOT 0.5 11/24/2021 1109  ? GFRNONAA >60 11/24/2021 1109  ? ?Lab Results  ?Component Value Date  ? WBC 4.8 11/24/2021  ? NEUTROABS 4.3 11/24/2021  ? HGB 13.6 11/24/2021  ? HCT 39.8 11/24/2021  ? MCV 90.9 11/24/2021  ? PLT 153 11/24/2021  ? ? ?Imaging: ? ?CT Head Wo Contrast ? ?Result Date: 10/27/2021 ?CLINICAL DATA:  Brain/CNS neoplasm. Assess treatment response. Left-sided weakness. Right-sided glioma. Three day history of worsening left-sided weakness. EXAM: CT HEAD WITHOUT CONTRAST TECHNIQUE: Contiguous axial images were obtained from the base of the skull through the vertex without intravenous contrast. RADIATION DOSE REDUCTION: This exam was performed according to the departmental dose-optimization program which includes automated exposure control, adjustment of the mA and/or kV according to patient size and/or use of iterative reconstruction technique. COMPARISON:  MRI 09/16/2021.  CT  09/15/2021. FINDINGS: Brain: Previous right pterional craniotomy for debulking of a large infiltrating malignant brain mass of the right hemisphere and brainstem. Since the postoperative studies, there is been

## 2021-11-25 ENCOUNTER — Other Ambulatory Visit: Payer: Self-pay | Admitting: Internal Medicine

## 2021-11-25 ENCOUNTER — Ambulatory Visit
Admission: RE | Admit: 2021-11-25 | Discharge: 2021-11-25 | Disposition: A | Discharge status: Home / Self Care | Payer: 59 | Source: Ambulatory Visit | Attending: Radiation Oncology | Admitting: Radiation Oncology

## 2021-11-25 ENCOUNTER — Other Ambulatory Visit: Payer: Self-pay

## 2021-11-25 DIAGNOSIS — C719 Malignant neoplasm of brain, unspecified: Secondary | ICD-10-CM

## 2021-11-25 DIAGNOSIS — Z510 Encounter for antineoplastic radiation therapy: Secondary | ICD-10-CM | POA: Diagnosis not present

## 2021-11-25 DIAGNOSIS — C712 Malignant neoplasm of temporal lobe: Secondary | ICD-10-CM | POA: Diagnosis not present

## 2021-11-26 ENCOUNTER — Ambulatory Visit
Admission: RE | Admit: 2021-11-26 | Discharge: 2021-11-26 | Disposition: A | Discharge status: Home / Self Care | Payer: 59 | Source: Ambulatory Visit | Attending: Radiation Oncology | Admitting: Radiation Oncology

## 2021-11-26 DIAGNOSIS — C712 Malignant neoplasm of temporal lobe: Secondary | ICD-10-CM | POA: Diagnosis not present

## 2021-11-26 DIAGNOSIS — Z510 Encounter for antineoplastic radiation therapy: Secondary | ICD-10-CM | POA: Diagnosis not present

## 2021-11-26 NOTE — Telephone Encounter (Signed)
Patient will have labs done prior to reordering chemo. ?

## 2021-11-27 ENCOUNTER — Other Ambulatory Visit: Payer: Self-pay

## 2021-11-27 ENCOUNTER — Other Ambulatory Visit: Payer: Self-pay | Admitting: Radiation Oncology

## 2021-11-27 ENCOUNTER — Ambulatory Visit
Admission: RE | Admit: 2021-11-27 | Discharge: 2021-11-27 | Disposition: A | Discharge status: Home / Self Care | Payer: 59 | Source: Ambulatory Visit | Attending: Radiation Oncology | Admitting: Radiation Oncology

## 2021-11-27 DIAGNOSIS — C712 Malignant neoplasm of temporal lobe: Secondary | ICD-10-CM | POA: Diagnosis not present

## 2021-11-27 DIAGNOSIS — Z510 Encounter for antineoplastic radiation therapy: Secondary | ICD-10-CM | POA: Diagnosis not present

## 2021-11-27 MED ORDER — FLUCONAZOLE 100 MG PO TABS: 100.0000 mg | ORAL_TABLET | Freq: Every day | ORAL | 0 refills | Status: DC

## 2021-11-28 DIAGNOSIS — Z510 Encounter for antineoplastic radiation therapy: Secondary | ICD-10-CM | POA: Diagnosis not present

## 2021-11-28 DIAGNOSIS — C712 Malignant neoplasm of temporal lobe: Secondary | ICD-10-CM | POA: Diagnosis not present

## 2021-11-30 ENCOUNTER — Ambulatory Visit
Admission: RE | Admit: 2021-11-30 | Discharge: 2021-11-30 | Disposition: A | Discharge status: Home / Self Care | Payer: 59 | Source: Ambulatory Visit | Attending: Radiation Oncology | Admitting: Radiation Oncology

## 2021-11-30 ENCOUNTER — Other Ambulatory Visit: Payer: Self-pay

## 2021-11-30 DIAGNOSIS — C712 Malignant neoplasm of temporal lobe: Secondary | ICD-10-CM | POA: Diagnosis not present

## 2021-11-30 DIAGNOSIS — Z510 Encounter for antineoplastic radiation therapy: Secondary | ICD-10-CM | POA: Diagnosis not present

## 2021-12-01 ENCOUNTER — Ambulatory Visit
Admission: RE | Admit: 2021-12-01 | Discharge: 2021-12-01 | Disposition: A | Discharge status: Home / Self Care | Payer: 59 | Source: Ambulatory Visit | Attending: Radiation Oncology | Admitting: Radiation Oncology

## 2021-12-01 DIAGNOSIS — Z510 Encounter for antineoplastic radiation therapy: Secondary | ICD-10-CM | POA: Diagnosis not present

## 2021-12-01 DIAGNOSIS — C712 Malignant neoplasm of temporal lobe: Secondary | ICD-10-CM | POA: Diagnosis not present

## 2021-12-02 ENCOUNTER — Ambulatory Visit
Admission: RE | Admit: 2021-12-02 | Discharge: 2021-12-02 | Disposition: A | Discharge status: Home / Self Care | Payer: 59 | Source: Ambulatory Visit | Attending: Radiation Oncology | Admitting: Radiation Oncology

## 2021-12-02 ENCOUNTER — Other Ambulatory Visit: Payer: Self-pay

## 2021-12-02 DIAGNOSIS — C712 Malignant neoplasm of temporal lobe: Secondary | ICD-10-CM | POA: Diagnosis not present

## 2021-12-02 DIAGNOSIS — Z510 Encounter for antineoplastic radiation therapy: Secondary | ICD-10-CM | POA: Diagnosis not present

## 2021-12-03 ENCOUNTER — Ambulatory Visit
Admission: RE | Admit: 2021-12-03 | Discharge: 2021-12-03 | Disposition: A | Discharge status: Home / Self Care | Payer: 59 | Source: Ambulatory Visit | Attending: Radiation Oncology | Admitting: Radiation Oncology

## 2021-12-03 DIAGNOSIS — C712 Malignant neoplasm of temporal lobe: Secondary | ICD-10-CM | POA: Diagnosis not present

## 2021-12-03 DIAGNOSIS — Z510 Encounter for antineoplastic radiation therapy: Secondary | ICD-10-CM | POA: Diagnosis not present

## 2021-12-04 ENCOUNTER — Other Ambulatory Visit: Payer: Self-pay

## 2021-12-04 ENCOUNTER — Encounter (HOSPITAL_COMMUNITY): Payer: Self-pay

## 2021-12-04 ENCOUNTER — Ambulatory Visit: Payer: 59

## 2021-12-04 ENCOUNTER — Ambulatory Visit
Admission: RE | Admit: 2021-12-04 | Discharge: 2021-12-04 | Disposition: A | Discharge status: Home / Self Care | Payer: 59 | Source: Ambulatory Visit | Attending: Radiation Oncology | Admitting: Radiation Oncology

## 2021-12-04 DIAGNOSIS — Z510 Encounter for antineoplastic radiation therapy: Secondary | ICD-10-CM | POA: Diagnosis not present

## 2021-12-04 DIAGNOSIS — C712 Malignant neoplasm of temporal lobe: Secondary | ICD-10-CM | POA: Diagnosis not present

## 2021-12-05 ENCOUNTER — Ambulatory Visit: Payer: 59

## 2021-12-07 ENCOUNTER — Other Ambulatory Visit: Payer: Self-pay

## 2021-12-07 ENCOUNTER — Ambulatory Visit
Admission: RE | Admit: 2021-12-07 | Discharge: 2021-12-07 | Disposition: A | Discharge status: Home / Self Care | Payer: 59 | Source: Ambulatory Visit | Attending: Radiation Oncology | Admitting: Radiation Oncology

## 2021-12-07 DIAGNOSIS — C712 Malignant neoplasm of temporal lobe: Secondary | ICD-10-CM | POA: Diagnosis not present

## 2021-12-07 DIAGNOSIS — Z510 Encounter for antineoplastic radiation therapy: Secondary | ICD-10-CM | POA: Diagnosis not present

## 2021-12-08 ENCOUNTER — Inpatient Hospital Stay: Payer: 59

## 2021-12-08 ENCOUNTER — Inpatient Hospital Stay (HOSPITAL_BASED_OUTPATIENT_CLINIC_OR_DEPARTMENT_OTHER): Payer: 59 | Admitting: Internal Medicine

## 2021-12-08 ENCOUNTER — Other Ambulatory Visit: Payer: Self-pay

## 2021-12-08 ENCOUNTER — Ambulatory Visit
Admission: RE | Admit: 2021-12-08 | Discharge: 2021-12-08 | Disposition: A | Discharge status: Home / Self Care | Payer: 59 | Source: Ambulatory Visit | Attending: Radiation Oncology | Admitting: Radiation Oncology

## 2021-12-08 VITALS — BP 122/89 | HR 60 | Temp 98.2°F | Resp 17 | Ht 60.0 in | Wt 117.6 lb

## 2021-12-08 DIAGNOSIS — C719 Malignant neoplasm of brain, unspecified: Secondary | ICD-10-CM

## 2021-12-08 DIAGNOSIS — C712 Malignant neoplasm of temporal lobe: Secondary | ICD-10-CM | POA: Diagnosis not present

## 2021-12-08 DIAGNOSIS — R531 Weakness: Secondary | ICD-10-CM | POA: Diagnosis not present

## 2021-12-08 DIAGNOSIS — Z510 Encounter for antineoplastic radiation therapy: Secondary | ICD-10-CM | POA: Diagnosis not present

## 2021-12-08 LAB — CBC WITH DIFFERENTIAL (CANCER CENTER ONLY)
Abs Immature Granulocytes: 0.01 10*3/uL (ref 0.00–0.07)
Basophils Absolute: 0 10*3/uL (ref 0.0–0.1)
Basophils Relative: 0 %
Eosinophils Absolute: 0 10*3/uL (ref 0.0–0.5)
Eosinophils Relative: 0 %
HCT: 39.9 % (ref 36.0–46.0)
Hemoglobin: 13.6 g/dL (ref 12.0–15.0)
Immature Granulocytes: 0 %
Lymphocytes Relative: 4 %
Lymphs Abs: 0.1 10*3/uL — ABNORMAL LOW (ref 0.7–4.0)
MCH: 31.5 pg (ref 26.0–34.0)
MCHC: 34.1 g/dL (ref 30.0–36.0)
MCV: 92.4 fL (ref 80.0–100.0)
Monocytes Absolute: 0.2 10*3/uL (ref 0.1–1.0)
Monocytes Relative: 5 %
Neutro Abs: 2.8 10*3/uL (ref 1.7–7.7)
Neutrophils Relative %: 91 %
Platelet Count: 39 10*3/uL — ABNORMAL LOW (ref 150–400)
RBC: 4.32 MIL/uL (ref 3.87–5.11)
RDW: 15.9 % — ABNORMAL HIGH (ref 11.5–15.5)
Smear Review: NORMAL
WBC Count: 3.1 10*3/uL — ABNORMAL LOW (ref 4.0–10.5)
nRBC: 0 % (ref 0.0–0.2)

## 2021-12-08 LAB — CMP (CANCER CENTER ONLY)
ALT: 43 U/L (ref 0–44)
AST: 17 U/L (ref 15–41)
Albumin: 3.9 g/dL (ref 3.5–5.0)
Alkaline Phosphatase: 52 U/L (ref 38–126)
Anion gap: 7 (ref 5–15)
BUN: 20 mg/dL (ref 6–20)
CO2: 27 mmol/L (ref 22–32)
Calcium: 8.9 mg/dL (ref 8.9–10.3)
Chloride: 100 mmol/L (ref 98–111)
Creatinine: 0.56 mg/dL (ref 0.44–1.00)
GFR, Estimated: >60 mL/min (ref >60)
Glucose, Bld: 108 mg/dL — ABNORMAL HIGH (ref 70–99)
Potassium: 3.7 mmol/L (ref 3.5–5.1)
Sodium: 134 mmol/L — ABNORMAL LOW (ref 135–145)
Total Bilirubin: 0.4 mg/dL (ref 0.3–1.2)
Total Protein: 6.5 g/dL (ref 6.5–8.1)

## 2021-12-08 LAB — RAD ONC ARIA SESSION SUMMARY
Course Elapsed Days: 36
Plan Fractions Treated to Date: 4
Plan Prescribed Dose Per Fraction: 2 Gy
Plan Total Fractions Prescribed: 7
Plan Total Prescribed Dose: 14 Gy
Reference Point Dosage Given to Date: 54 Gy
Reference Point Session Dosage Given: 2 Gy
Session Number: 27

## 2021-12-08 NOTE — Progress Notes (Signed)
? ?[facility] [address]  ?[place], [place] [address] ?[phone] ? ? ?Interval Evaluation ? ?Date of Service: 12/08/21 ?Patient Name: Patricia Stevenson ?Patient MRN: [id number] ?Patient DOB: [birth year] ?Provider: Ventura Sellers, MD ? ?Identifying Statement:  ?Patricia Stevenson is a 61 y.o. female with right temporal  high grade glioma   ? ?Oncologic History: ?Oncology History  ?High grade glioma not classifiable by WHO criteria ([place])  ?09/15/2021 Initial Diagnosis  ? High grade glioma not classifiable by WHO criteria ([place]) ? ?  ?09/15/2021 Cancer Staging  ? Staging form: Brain and Spinal Cord, AJCC Version 9 ?- Clinical stage from 09/15/2021: No stage assigned - Signed by Ventura Sellers, MD on 10/08/2021 ?Histopathologic type: Astrocytoma, NOS ?Stage prefix: Initial diagnosis ? ?  ?10/19/2021 -  Chemotherapy  ? Patient is on Treatment Plan : BRAIN GLIOBLASTOMA Radiation Therapy With Concurrent Temozolomide 75 mg/m2 Daily Followed By Sequential Maintenance Temozolomide x 6-12 cycles  ? ?  ?  ? ? ?Biomarkers: ? ?MGMT Unknown.  ?IDH 1/2 Wild type.  ?EGFR Unknown  ?TERT Unknown  ? ?Interval History: ?Patricia Stevenson presents for follow up, now in final week of IMRT and Temodar.  No significant issues with the chemo.  Left sided weakness is stable. Decadron today is final dose of 37m.  Walking independently, not relying on family for help with basic ADLs. No further seizures. ? ?Medications: ?Current Outpatient Medications on File Prior to Visit  ?Medication Sig Dispense Refill  ? acetaminophen (TYLENOL) 500 MG tablet Take 1,000 mg by mouth every 6 (six) hours as needed for moderate pain or headache.    ? Ascorbic Acid (VITA-C PO) Take 500 mg by mouth daily. Unsure of dose    ? calcium carbonate (OSCAL) 1500 (600 Ca) MG TABS tablet Take 1,500 mg by mouth 2 (two) times daily with a meal.    ? dicyclomine (BENTYL) 10 MG capsule Take 1 capsule (10 mg total) by mouth in the morning and at  bedtime. As needed (Patient taking differently: Take 10 mg by mouth 2 (two) times daily as needed for spasms.) 90 capsule 0  ? diltiazem (CARDIZEM CD) 120 MG 24 hr capsule Take 1 capsule (120 mg total) by mouth daily. 60 capsule 3  ? famotidine (PEPCID) 20 MG tablet Take 1 tablet (20 mg total) by mouth 2 (two) times daily. 60 tablet 3  ? fluconazole (DIFLUCAN) 100 MG tablet Take 1 tablet (100 mg total) by mouth daily. Take 2 tabs x 1, then 1 tab QD x 9 additional days. 11 tablet 0  ? levETIRAcetam (KEPPRA) 500 MG tablet Take 1 tablet (500 mg total) by mouth 2 (two) times daily. 60 tablet 2  ? ondansetron (ZOFRAN) 8 MG tablet Take 1 tablet (8 mg total) by mouth 2 (two) times daily as needed (nausea and vomiting). May take 30-60 minutes prior to Temodar administration if nausea/vomiting occurs. 30 tablet 1  ? temozolomide (TEMODAR) 100 MG capsule Take 1 capsule (100 mg total) by mouth daily. May take on an empty stomach to decrease nausea & vomiting. 42 capsule 0  ? VITAMIN D PO Take 1,000 Units by mouth daily.    ? ?No current facility-administered medications on file prior to visit.  ? ? ?Allergies:  ?Allergies  ?Allergen Reactions  ? Latex   ? ?Past Medical History:  ?Past Medical History:  ?Diagnosis Date  ? GERD (gastroesophageal reflux disease)   ? Hyperlipidemia   ? Vitamin  D deficiency   ? ?Past Surgical History:  ?Past Surgical History:  ?Procedure Laterality Date  ? APPLICATION OF CRANIAL NAVIGATION Right 09/15/2021  ? Procedure: APPLICATION OF CRANIAL NAVIGATION;  Surgeon: Vallarie Mare, MD;  Location: [facility];  Service: Neurosurgery;  Laterality: Right;  ? BREAST BIOPSY Left   ? 20 years ago in [place]- benign- not sure if bx or cyst  ? CHOLECYSTECTOMY    ? Laparoscopic cholecystectomy 04/29/2021  ? CRANIOTOMY Right 09/15/2021  ? Procedure: Right Craniotomy Temporal Lobectomy with Brain Lab;  Surgeon: Vallarie Mare, MD;  Location: [facility];  Service: Neurosurgery;  Laterality: Right;  ? ?Social  History:  ?Social History  ? ?Socioeconomic History  ? Marital status: Married  ?  Spouse name: Not on file  ? Number of children: Not on file  ? Years of education: Not on file  ? Highest education level: Not on file  ?Occupational History  ? Not on file  ?Tobacco Use  ? Smoking status: Never  ? Smokeless tobacco: Never  ?Vaping Use  ? Vaping Use: Not on file  ?Substance and Sexual Activity  ? Alcohol use: No  ? Drug use: No  ? Sexual activity: Not on file  ?Other Topics Concern  ? Not on file  ?Social History Narrative  ? Not on file  ? ?Social Determinants of Health  ? ?Financial Resource Strain: Not on file  ?Food Insecurity: Not on file  ?Transportation Needs: Not on file  ?Physical Activity: Not on file  ?Stress: Not on file  ?Social Connections: Not on file  ?Intimate Partner Violence: Not on file  ? ?Family History:  ?Family History  ?Problem Relation Age of Onset  ? Breast cancer Neg Hx   ? CAD Neg Hx   ? ? ?Review of Systems: ?Constitutional: Doesn't report fevers, chills or abnormal weight loss ?Eyes: Doesn't report blurriness of vision ?Ears, nose, mouth, throat, and face: Doesn't report sore throat ?Respiratory: Doesn't report cough, dyspnea or wheezes ?Cardiovascular: Doesn't report palpitation, chest discomfort  ?Gastrointestinal:  Doesn't report nausea, constipation, diarrhea ?GU: Doesn't report incontinence ?Skin: Doesn't report skin rashes ?Neurological: Per HPI ?Musculoskeletal: Doesn't report joint pain ?Behavioral/Psych: Doesn't report anxiety ? ?Physical Exam: ?Vitals:  ? 12/08/21 1148  ?BP: 122/89  ?Pulse: 60  ?Resp: 17  ?Temp: 98.2 ?F (36.8 ?C)  ?SpO2: 100%  ? ?KPS: 70. ?General: Alert, cooperative, pleasant, in no acute distress ?Head: Normal ?EENT: No conjunctival injection or scleral icterus.  ?Lungs: Resp effort normal ?Cardiac: Regular rate ?Abdomen: Non-distended abdomen ?Skin: No rashes cyanosis or petechiae. ?Extremities: No clubbing or edema ? ?Neurologic Exam: ?Mental Status: Awake,  alert, attentive to examiner. Oriented to self and environment. Language is fluent with intact comprehension.  ?Cranial Nerves: Visual acuity is grossly normal. Visual fields are full. 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 Face is symmetric ?Motor: Tone and bulk are normal. Power is 4+/5 in left arm and leg with noted drift. Reflexes are symmetric, no pathologic reflexes present.  ?Sensory: Intact to light touch ?Gait: Hemiparetic ? ? ?Labs: ?I have reviewed the data as listed ?   ?Component Value Date/Time  ? NA 132 (L) 11/24/2021 1109  ? K 4.2 11/24/2021 1109  ? CL 98 11/24/2021 1109  ? CO2 27 11/24/2021 1109  ? GLUCOSE 113 (H) 11/24/2021 1109  ? BUN 20 11/24/2021 1109  ? CREATININE 0.41 (L) 11/24/2021 1109  ? CREATININE 0.66 05/21/2020 0811  ? CALCIUM 8.9 11/24/2021 1109  ? PROT 6.4 (  L) 11/24/2021 1109  ? ALBUMIN 3.9 11/24/2021 1109  ? AST 10 (L) 11/24/2021 1109  ? ALT 26 11/24/2021 1109  ? ALKPHOS 53 11/24/2021 1109  ? BILITOT 0.5 11/24/2021 1109  ? GFRNONAA >60 11/24/2021 1109  ? ?Lab Results  ?Component Value Date  ? WBC 3.1 (L) 12/08/2021  ? NEUTROABS 2.8 12/08/2021  ? HGB 13.6 12/08/2021  ? HCT 39.9 12/08/2021  ? MCV 92.4 12/08/2021  ? PLT 39 (L) 12/08/2021  ? ? ?Assessment/Plan ?High grade glioma not classifiable by WHO criteria ([place]) ? ?Left-sided weakness ? ?Patricia Stevenson is clinically stable, now in final week of IMRT and Temodar.  No new or progressive changes.  Unfortunately, labs demonstrate significant thrombocytopenia. ? ?We recommended withholding further concurrent Temodar.  Radiation will be administered Mon-Fri over 6 weeks, last treatment this Friday. ? ?Chemotherapy should be held for the following: ?? ANC less than 1,000 ?? Platelets less than 100,000 ?? LFT or creatinine greater than 2x ULN ?? If clinical concerns/contraindications develop ? ?Decadron will remain off assuming no progression of left sided weakness. ? ?Keppra should continue 569m BID.   ? ?We will arrange for  home PT and OT given rehab needs, hemiparesis. ? ?We ask that Patricia Harderreturn to clinic in 1 months following post-IMRT brain MRI, or sooner as needed.  In 1 week CBC will be repeated as well given platelet coun

## 2021-12-08 NOTE — Progress Notes (Signed)
Attempted to get approval for home health with [facility] and they are not contracted with the patients insurance company. ? ?Routed to [place], pending response on if they can accept this insurance. ?

## 2021-12-09 ENCOUNTER — Other Ambulatory Visit: Payer: Self-pay

## 2021-12-09 ENCOUNTER — Telehealth: Payer: Self-pay

## 2021-12-09 ENCOUNTER — Telehealth: Payer: Self-pay | Admitting: Internal Medicine

## 2021-12-09 ENCOUNTER — Ambulatory Visit
Admission: RE | Admit: 2021-12-09 | Discharge: 2021-12-09 | Disposition: A | Discharge status: Home / Self Care | Payer: 59 | Source: Ambulatory Visit | Attending: Radiation Oncology | Admitting: Radiation Oncology

## 2021-12-09 DIAGNOSIS — C712 Malignant neoplasm of temporal lobe: Secondary | ICD-10-CM | POA: Diagnosis not present

## 2021-12-09 DIAGNOSIS — Z510 Encounter for antineoplastic radiation therapy: Secondary | ICD-10-CM | POA: Diagnosis not present

## 2021-12-09 LAB — RAD ONC ARIA SESSION SUMMARY
Course Elapsed Days: 37
Plan Fractions Treated to Date: 5
Plan Prescribed Dose Per Fraction: 2 Gy
Plan Total Fractions Prescribed: 7
Plan Total Prescribed Dose: 14 Gy
Reference Point Dosage Given to Date: 56 Gy
Reference Point Session Dosage Given: 2 Gy
Session Number: 28

## 2021-12-09 NOTE — Telephone Encounter (Signed)
[facility] unable to staff request for OT and PT Home Health ?

## 2021-12-09 NOTE — Telephone Encounter (Signed)
Scheduled per 4/18 los, pt has been called and confirmed  ?

## 2021-12-10 ENCOUNTER — Ambulatory Visit
Admission: RE | Admit: 2021-12-10 | Discharge: 2021-12-10 | Disposition: A | Discharge status: Home / Self Care | Payer: 59 | Source: Ambulatory Visit | Attending: Radiation Oncology | Admitting: Radiation Oncology

## 2021-12-10 ENCOUNTER — Telehealth: Payer: Self-pay | Admitting: *Deleted

## 2021-12-10 ENCOUNTER — Other Ambulatory Visit: Payer: Self-pay

## 2021-12-10 DIAGNOSIS — C712 Malignant neoplasm of temporal lobe: Secondary | ICD-10-CM | POA: Diagnosis not present

## 2021-12-10 DIAGNOSIS — G9389 Other specified disorders of brain: Secondary | ICD-10-CM

## 2021-12-10 DIAGNOSIS — Z510 Encounter for antineoplastic radiation therapy: Secondary | ICD-10-CM | POA: Diagnosis not present

## 2021-12-10 LAB — RAD ONC ARIA SESSION SUMMARY
Course Elapsed Days: 38
Plan Fractions Treated to Date: 6
Plan Prescribed Dose Per Fraction: 2 Gy
Plan Total Fractions Prescribed: 7
Plan Total Prescribed Dose: 14 Gy
Reference Point Dosage Given to Date: 58 Gy
Reference Point Session Dosage Given: 2 Gy
Session Number: 29

## 2021-12-10 MED ORDER — ONDANSETRON HCL 4 MG PO TABS
8.0000 mg | ORAL_TABLET | Freq: Once | ORAL | Status: DC
  Filled 2021-12-10: qty 2

## 2021-12-10 MED ORDER — ONDANSETRON HCL 4 MG PO TABS
4.0000 mg | ORAL_TABLET | Freq: Once | ORAL | Status: AC
  Administered 2021-12-10: 4 mg via ORAL
  Filled 2021-12-10: qty 1

## 2021-12-10 NOTE — Telephone Encounter (Signed)
[facility] was unable to accept referral. ? ?Reached out to [facility] and they will review for PT/OT referral. ?

## 2021-12-11 ENCOUNTER — Encounter: Payer: Self-pay | Admitting: Radiation Oncology

## 2021-12-11 ENCOUNTER — Ambulatory Visit
Admission: RE | Admit: 2021-12-11 | Discharge: 2021-12-11 | Disposition: A | Discharge status: Home / Self Care | Payer: 59 | Source: Ambulatory Visit | Attending: Radiation Oncology | Admitting: Radiation Oncology

## 2021-12-11 ENCOUNTER — Other Ambulatory Visit: Payer: Self-pay

## 2021-12-11 ENCOUNTER — Ambulatory Visit: Payer: 59

## 2021-12-11 DIAGNOSIS — C712 Malignant neoplasm of temporal lobe: Secondary | ICD-10-CM | POA: Diagnosis not present

## 2021-12-11 DIAGNOSIS — Z510 Encounter for antineoplastic radiation therapy: Secondary | ICD-10-CM | POA: Diagnosis not present

## 2021-12-11 LAB — RAD ONC ARIA SESSION SUMMARY
Course Elapsed Days: 39
Plan Fractions Treated to Date: 7
Plan Prescribed Dose Per Fraction: 2 Gy
Plan Total Fractions Prescribed: 7
Plan Total Prescribed Dose: 14 Gy
Reference Point Dosage Given to Date: 60 Gy
Reference Point Session Dosage Given: 2 Gy
Session Number: 30

## 2021-12-14 ENCOUNTER — Other Ambulatory Visit: Payer: Self-pay | Admitting: *Deleted

## 2021-12-14 ENCOUNTER — Other Ambulatory Visit: Payer: Self-pay | Admitting: Internal Medicine

## 2021-12-14 ENCOUNTER — Telehealth: Payer: Self-pay | Admitting: *Deleted

## 2021-12-14 MED ORDER — DEXAMETHASONE 4 MG PO TABS: 4.0000 mg | ORAL_TABLET | Freq: Two times a day (BID) | ORAL | 1 refills | Status: AC

## 2021-12-14 NOTE — Telephone Encounter (Signed)
Daughter was made aware of Decadron dosing and has no further questions at this time. ?

## 2021-12-14 NOTE — Telephone Encounter (Signed)
Received from [name] (daughter).   ? ?Patient has had a significant decline since visit last week.  Patient was walking independently that day but now is unable to walk alone, is much weaker, not eating very well at all.  Has reported new urinary incontinence and still reports constipation.  Increased right sided weakness and muscle cramping in the toe. ? ?She stopped her Decadron 1 mg at the last visit and oral chemo therapy.  She wanted to make the provider aware.   ? ?Routing to Dr Mickeal Skinner. ? ?Pharmacy [facility] [address]. ? ? ?

## 2021-12-15 ENCOUNTER — Telehealth: Payer: Self-pay | Admitting: *Deleted

## 2021-12-15 ENCOUNTER — Other Ambulatory Visit: Payer: 59

## 2021-12-15 ENCOUNTER — Other Ambulatory Visit: Payer: Self-pay | Admitting: *Deleted

## 2021-12-15 DIAGNOSIS — C719 Malignant neoplasm of brain, unspecified: Secondary | ICD-10-CM

## 2021-12-15 NOTE — Telephone Encounter (Signed)
[facility] unable to take patient for physical therapy.  Spoke with patients family and they advised that they could get her to her appointments if the therapy was outpatient.   ? ?Placed order for PT and OT with [facility]. ?

## 2021-12-16 ENCOUNTER — Other Ambulatory Visit: Payer: Self-pay

## 2021-12-16 ENCOUNTER — Emergency Department (HOSPITAL_BASED_OUTPATIENT_CLINIC_OR_DEPARTMENT_OTHER): Payer: 59

## 2021-12-16 ENCOUNTER — Emergency Department (HOSPITAL_COMMUNITY): Admission: RE | Admit: 2021-12-16 | Payer: 59 | Source: Ambulatory Visit

## 2021-12-16 ENCOUNTER — Inpatient Hospital Stay (HOSPITAL_BASED_OUTPATIENT_CLINIC_OR_DEPARTMENT_OTHER): Payer: 59 | Admitting: Physician Assistant

## 2021-12-16 ENCOUNTER — Inpatient Hospital Stay: Payer: 59

## 2021-12-16 ENCOUNTER — Encounter (HOSPITAL_COMMUNITY): Payer: Self-pay | Admitting: Pharmacy Technician

## 2021-12-16 ENCOUNTER — Emergency Department (HOSPITAL_COMMUNITY): Payer: 59

## 2021-12-16 ENCOUNTER — Emergency Department (HOSPITAL_COMMUNITY)
Admission: EM | Admit: 2021-12-16 | Discharge: 2021-12-16 | Disposition: A | Discharge status: Home / Self Care | Payer: 59 | Attending: Emergency Medicine | Admitting: Emergency Medicine

## 2021-12-16 VITALS — BP 118/70 | HR 58 | Temp 97.8°F | Resp 16 | Ht 60.0 in

## 2021-12-16 DIAGNOSIS — M7989 Other specified soft tissue disorders: Secondary | ICD-10-CM | POA: Diagnosis not present

## 2021-12-16 DIAGNOSIS — R0602 Shortness of breath: Secondary | ICD-10-CM

## 2021-12-16 DIAGNOSIS — D696 Thrombocytopenia, unspecified: Secondary | ICD-10-CM | POA: Diagnosis not present

## 2021-12-16 DIAGNOSIS — R0609 Other forms of dyspnea: Secondary | ICD-10-CM | POA: Diagnosis not present

## 2021-12-16 DIAGNOSIS — Z91040 Latex allergy status: Secondary | ICD-10-CM | POA: Diagnosis not present

## 2021-12-16 DIAGNOSIS — C719 Malignant neoplasm of brain, unspecified: Secondary | ICD-10-CM

## 2021-12-16 DIAGNOSIS — R609 Edema, unspecified: Secondary | ICD-10-CM

## 2021-12-16 DIAGNOSIS — R600 Localized edema: Secondary | ICD-10-CM

## 2021-12-16 LAB — CMP (CANCER CENTER ONLY)
ALT: 40 U/L (ref 0–44)
AST: 14 U/L — ABNORMAL LOW (ref 15–41)
Albumin: 3.9 g/dL (ref 3.5–5.0)
Alkaline Phosphatase: 68 U/L (ref 38–126)
Anion gap: 5 (ref 5–15)
BUN: 15 mg/dL (ref 6–20)
CO2: 27 mmol/L (ref 22–32)
Calcium: 9.2 mg/dL (ref 8.9–10.3)
Chloride: 102 mmol/L (ref 98–111)
Creatinine: 0.54 mg/dL (ref 0.44–1.00)
GFR, Estimated: >60 mL/min (ref >60)
Glucose, Bld: 144 mg/dL — ABNORMAL HIGH (ref 70–99)
Potassium: 4.2 mmol/L (ref 3.5–5.1)
Sodium: 134 mmol/L — ABNORMAL LOW (ref 135–145)
Total Bilirubin: 0.3 mg/dL (ref 0.3–1.2)
Total Protein: 6.5 g/dL (ref 6.5–8.1)

## 2021-12-16 LAB — CBC WITH DIFFERENTIAL (CANCER CENTER ONLY)
Abs Immature Granulocytes: 0.01 10*3/uL (ref 0.00–0.07)
Basophils Absolute: 0 10*3/uL (ref 0.0–0.1)
Basophils Relative: 0 %
Eosinophils Absolute: 0 10*3/uL (ref 0.0–0.5)
Eosinophils Relative: 0 %
HCT: 35.6 % — ABNORMAL LOW (ref 36.0–46.0)
Hemoglobin: 12.6 g/dL (ref 12.0–15.0)
Immature Granulocytes: 0 %
Lymphocytes Relative: 9 %
Lymphs Abs: 0.3 10*3/uL — ABNORMAL LOW (ref 0.7–4.0)
MCH: 32.4 pg (ref 26.0–34.0)
MCHC: 35.4 g/dL (ref 30.0–36.0)
MCV: 91.5 fL (ref 80.0–100.0)
Monocytes Absolute: 0.1 10*3/uL (ref 0.1–1.0)
Monocytes Relative: 4 %
Neutro Abs: 2.6 10*3/uL (ref 1.7–7.7)
Neutrophils Relative %: 87 %
Platelet Count: 20 10*3/uL — ABNORMAL LOW (ref 150–400)
RBC: 3.89 MIL/uL (ref 3.87–5.11)
RDW: 15.6 % — ABNORMAL HIGH (ref 11.5–15.5)
WBC Count: 2.9 10*3/uL — ABNORMAL LOW (ref 4.0–10.5)
nRBC: 0 % (ref 0.0–0.2)

## 2021-12-16 LAB — TROPONIN I (HIGH SENSITIVITY): Troponin I (High Sensitivity): 8 ng/L (ref <18)

## 2021-12-16 MED ORDER — IOHEXOL 350 MG/ML SOLN
100.0000 mL | Freq: Once | INTRAVENOUS | Status: AC | PRN
Start: 2021-12-16 — End: 2021-12-16
  Administered 2021-12-16: 75 mL via INTRAVENOUS

## 2021-12-16 MED ORDER — LIDOCAINE VISCOUS HCL 2 % MT SOLN: 15.0000 mL | OROMUCOSAL | 0 refills | Status: DC | PRN

## 2021-12-16 MED ORDER — FLUCONAZOLE 100 MG PO TABS: 100.0000 mg | ORAL_TABLET | Freq: Every day | ORAL | 0 refills | Status: DC

## 2021-12-16 NOTE — ED Triage Notes (Signed)
Pt here from cancer center with reports of LLE edema X1 day with exertional shob X3 days. Pt also with platelet count of 20 on todays labs. Finished chemo on Tuesday and last radiation was on Friday.  ? ?

## 2021-12-16 NOTE — ED Provider Notes (Signed)
?[place] DEPT ?Provider Note ? ? ?CSN: [id number] ?Arrival date & time: 12/16/21  1430 ? ?  ? ?History ? ?Chief Complaint  ?Patient presents with  ? Leg Swelling  ? Abnormal Lab  ? Shortness of Breath  ? ? ?Patricia Stevenson is a 61 y.o. female. ? ?Patient with some shortness of breath.  She has a history of glioblastoma.  Patient was sent over from oncology to get CT angio of the chest and DVT studies of the legs ? ?The history is provided by the patient and medical records. No language interpreter was used.  ?Shortness of Breath ?Severity:  Mild ?Onset quality:  Gradual ?Timing:  Constant ?Progression:  Waxing and waning ?Chronicity:  New ?Context: activity   ?Relieved by:  Nothing ?Worsened by:  Nothing ?Associated symptoms: no abdominal pain, no chest pain, no cough, no headaches and no rash   ? ?  ? ?Home Medications ?Prior to Admission medications   ?Medication Sig Start Date End Date Taking? Authorizing Provider  ?acetaminophen (TYLENOL) 500 MG tablet Take 1,000 mg by mouth every 6 (six) hours as needed for moderate pain or headache.    [provider]  ?Ascorbic Acid (VITA-C PO) Take 500 mg by mouth daily. Unsure of dose    [provider]  ?calcium carbonate (OSCAL) 1500 (600 Ca) MG TABS tablet Take 1,500 mg by mouth 2 (two) times daily with a meal.    [provider]  ?dexamethasone (DECADRON) 4 MG tablet Take 1 tablet (4 mg total) by mouth 2 (two) times daily with a meal. 12/14/21   Vaslow, Acey Lav, MD  ?dicyclomine (BENTYL) 10 MG capsule Take 1 capsule (10 mg total) by mouth in the morning and at bedtime. As needed ?Patient taking differently: Take 10 mg by mouth 2 (two) times daily as needed for spasms. 09/03/21   Mauri Pole, MD  ?diltiazem (CARDIZEM CD) 120 MG 24 hr capsule Take 1 capsule (120 mg total) by mouth daily. 09/20/21   Vallarie Mare, MD  ?famotidine (PEPCID) 20 MG tablet Take 1 tablet (20 mg total) by mouth 2 (two) times  daily. 09/03/21   Mauri Pole, MD  ?fluconazole (DIFLUCAN) 100 MG tablet Take 1 tablet (100 mg total) by mouth daily. Take 2 tablets on day 1, then 1 tablet daily 12/16/21   Barrie Folk, PA-C  ?levETIRAcetam (KEPPRA) 500 MG tablet Take 1 tablet (500 mg total) by mouth 2 (two) times daily. 09/19/21   Vallarie Mare, MD  ?lidocaine (XYLOCAINE) 2 % solution Use as directed 15 mLs in the mouth or throat as needed for mouth pain. 12/16/21   Walisiewicz, Harley Hallmark, PA-C  ?ondansetron (ZOFRAN) 8 MG tablet Take 1 tablet (8 mg total) by mouth 2 (two) times daily as needed (nausea and vomiting). May take 30-60 minutes prior to Temodar administration if nausea/vomiting occurs. 10/29/21   Ventura Sellers, MD  ?temozolomide (TEMODAR) 100 MG capsule Take 1 capsule (100 mg total) by mouth daily. May take on an empty stomach to decrease nausea & vomiting. 10/29/21   Ventura Sellers, MD  ?VITAMIN D PO Take 1,000 Units by mouth daily.    [provider]  ?   ? ?Allergies    ?Latex   ? ?Review of Systems   ?Review of Systems  ?Constitutional:  Negative for appetite change and fatigue.  ?HENT:  Negative for congestion, ear discharge and sinus pressure.   ?Eyes:  Negative for discharge.  ?  Respiratory:  Positive for shortness of breath. Negative for cough.   ?Cardiovascular:  Negative for chest pain.  ?Gastrointestinal:  Negative for abdominal pain and diarrhea.  ?Genitourinary:  Negative for frequency and hematuria.  ?Musculoskeletal:  Negative for back pain.  ?Skin:  Negative for rash.  ?Neurological:  Negative for seizures and headaches.  ?Psychiatric/Behavioral:  Negative for hallucinations.   ? ?Physical Exam ?Updated Vital Signs ?BP 137/83   Pulse (!) 57   Temp 98.1 ?F (36.7 ?C) (Oral)   Resp 12   SpO2 99%  ?Physical Exam ?Vitals and nursing note reviewed.  ?Constitutional:   ?   Appearance: She is well-developed.  ?HENT:  ?   Head: Normocephalic.  ?   Nose: Nose normal.  ?Eyes:  ?   General: No  scleral icterus. ?   Conjunctiva/sclera: Conjunctivae normal.  ?Neck:  ?   Thyroid: No thyromegaly.  ?Cardiovascular:  ?   Rate and Rhythm: Normal rate and regular rhythm.  ?   Heart sounds: No murmur heard. ?  No friction rub. No gallop.  ?Pulmonary:  ?   Breath sounds: No stridor. No wheezing or rales.  ?Chest:  ?   Chest wall: No tenderness.  ?Abdominal:  ?   General: There is no distension.  ?   Tenderness: There is no abdominal tenderness. There is no rebound.  ?Musculoskeletal:     ?   General: Normal range of motion.  ?   Cervical back: Neck supple.  ?Lymphadenopathy:  ?   Cervical: No cervical adenopathy.  ?Skin: ?   Findings: No erythema or rash.  ?Neurological:  ?   Mental Status: She is alert and oriented to person, place, and time.  ?   Motor: No abnormal muscle tone.  ?   Coordination: Coordination normal.  ?Psychiatric:     ?   Behavior: Behavior normal.  ? ? ?ED Results / Procedures / Treatments   ?Labs ?(all labs ordered are listed, but only abnormal results are displayed) ?Labs Reviewed  ?TROPONIN I (HIGH SENSITIVITY)  ? ? ?EKG ?None ? ?Radiology ?CT Angio Chest PE W and/or Wo Contrast ? ?Result Date: 12/16/2021 ?CLINICAL DATA:  Shortness of breath and left lower extremity edema. Brain tumor. EXAM: CT ANGIOGRAPHY CHEST WITH CONTRAST TECHNIQUE: Multidetector CT imaging of the chest was performed using the standard protocol during bolus administration of intravenous contrast. Multiplanar CT image reconstructions and MIPs were obtained to evaluate the vascular anatomy. RADIATION DOSE REDUCTION: This exam was performed according to the departmental dose-optimization program which includes automated exposure control, adjustment of the mA and/or kV according to patient size and/or use of iterative reconstruction technique. CONTRAST:  26m OMNIPAQUE IOHEXOL 350 MG/ML SOLN COMPARISON:  None. FINDINGS: Cardiovascular: Negative for pulmonary embolus. Atherosclerotic calcification of the aorta. Enlarged  pulmonic trunk. Heart is at the upper limits of normal in size. No pericardial effusion. Mediastinum/Nodes: No pathologically enlarged mediastinal, hilar or axillary lymph nodes. Esophagus is grossly unremarkable. Lungs/Pleura: Ground-glass is seen in both lower lobes and may be due to slight expiratory phase imaging. No dense airspace consolidation. No pleural fluid. Airway is unremarkable. Upper Abdomen: Visualized portions of the liver, adrenal glands, kidneys, spleen, pancreas, stomach and bowel are grossly unremarkable. Musculoskeletal: Degenerative changes in the spine. No worrisome lytic or sclerotic lesions. Review of the MIP images confirms the above findings. IMPRESSION: 1. Negative for pulmonary embolus. 2. No definitive findings to explain the patient's clinical history. 3.  Aortic atherosclerosis (ICD10-I70.0). 4. Enlarged pulmonic trunk, indicative of  pulmonary arterial hypertension. Electronically Signed   By: Lorin Picket M.D.   On: 12/16/2021 16:10  ? ?DG Chest Port 1 View ? ?Result Date: 12/16/2021 ?CLINICAL DATA:  Shortness of breath EXAM: PORTABLE CHEST 1 VIEW COMPARISON:  None. FINDINGS: The heart size and mediastinal contours are within normal limits. Both lungs are clear. The visualized skeletal structures are unremarkable. IMPRESSION: No acute abnormality of the lungs in AP portable projection. Electronically Signed   By: Delanna Ahmadi M.D.   On: 12/16/2021 15:26  ? ?VAS [place] LOWER EXTREMITY VENOUS (DVT) (ONLY [facility] & [facility]) ? ?Result Date: 12/16/2021 ? Lower Venous DVT Study Patient Name:  KATTALEYA ALIA  Date of Exam:   12/16/2021 Medical Rec #: [id number]     Accession #:    [id number] Date of Birth: 1961-03-15    Patient Gender: F Patient Age:   73 years Exam Location:  [facility] Procedure:      VAS [place] LOWER EXTREMITY VENOUS (DVT) Referring Phys: Izeah Vossler --------------------------------------------------------------------------------  Indications: SOB, and LLE edema.  Risk Factors:  Chemotherapy. Comparison Study: No previous exams Performing Technologist: Jody Hill RVT, RDMS  Examination Guidelines: A complete evaluation includes B-mode imaging, spectral Doppler, color Doppler, and power Doppler as

## 2021-12-16 NOTE — Progress Notes (Signed)
? ? ? ?Symptom Management Consult note ?[place]   ? ?Patient Care Team: ?Isaac Bliss, Rayford Halsted, MD as PCP - General (Internal Medicine) ?Janina Mayo, MD as PCP - Cardiology (Cardiology)  ? ? ?Name of the patient: Patricia Stevenson  [id number]  February 28, 1961  ? ?Date of visit: 12/16/2021  ? ? ?Chief complaint/ Reason for visit- thrush, shortness of breath, left lower extremity edema ? 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 ?  ?09/15/2021 Cancer Staging  ? Staging form: Brain and Spinal Cord, AJCC Version 9 ?- Clinical stage from 09/15/2021: No stage assigned - Signed by Ventura Sellers, MD on 10/08/2021 ?Histopathologic type: Astrocytoma, NOS ?Stage prefix: Initial diagnosis ? ?  ?10/19/2021 -  Chemotherapy  ? Patient is on Treatment Plan : BRAIN GLIOBLASTOMA Radiation Therapy With Concurrent Temozolomide 75 mg/m2 Daily Followed By Sequential Maintenance Temozolomide x 6-12 cycles  ? ?  ?  ? ? ?Current Therapy: Temodar, recently finished radiation ? ?Interval history-  Patricia Stevenson is a 61 yo female with oncologic history as above presenting to [facility] today with chief complaint of shortness of breath, left lower extremity swelling and thrush.  ?Shortness of breath has been present x3 days.  Patient states that she is having difficulty walking up the stairs and has to stop to catch her breath multiple times.  She states this is new.  Daughter accompanies patient and provides additional history.  She noticed this morning patient had thick white coating on her tongue.  Patient was recently treated for thrush earlier this month and had complete resolution with fluconazole. Patient endorses pain with eating.  She is currently using an over-the-counter alcohol free mouthwash without symptom improvement. She admits to decreased appetite because of the pain.  Daughter noticed today  that patient also had swelling of her left lower leg.  Patient denies any associated leg pain.Patient is not on any blood thinners and denies history of DVT. No OTC medications tried prior to arrival today.  Daughter also states that patient is continuing to have left-sided weakness.  Patient recently started on Decadron for the left-sided weakness. She denies any fever, chills, chest pain, hemoptysis, blood in stool, bleeding gums, nose bleeds, bruising, rash, numbness, tingling, seizures. ? ? ?ROS  ?All other systems are reviewed and are negative for acute change except as noted in the HPI. ? ? ? ?Allergies  ?Allergen Reactions  ? Latex   ? ? ? ?Past Medical History:  ?Diagnosis Date  ? GERD (gastroesophageal reflux disease)   ? Hyperlipidemia   ? Vitamin D deficiency   ? ? ? ?Past Surgical History:  ?Procedure Laterality Date  ? APPLICATION OF CRANIAL NAVIGATION Right 09/15/2021  ? Procedure: APPLICATION OF CRANIAL NAVIGATION;  Surgeon: Vallarie Mare, MD;  Location: [place];  Service: Neurosurgery;  Laterality: Right;  ? BREAST BIOPSY Left   ? 20 years ago in [place]- benign- not sure if bx or cyst  ? CHOLECYSTECTOMY    ? Laparoscopic cholecystectomy 04/29/2021  ? CRANIOTOMY Right 09/15/2021  ? Procedure: Right Craniotomy Temporal Lobectomy with Brain Lab;  Surgeon: Vallarie Mare, MD;  Location: [place];  Service: Neurosurgery;  Laterality: Right;  ? ? ?Social History  ? ?Socioeconomic History  ? Marital status: Married  ?  Spouse name: Not on file  ? Number of children: Not on file  ?  Years of education: Not on file  ? Highest education level: Not on file  ?Occupational History  ? Not on file  ?Tobacco Use  ? Smoking status: Never  ? Smokeless tobacco: Never  ?Vaping Use  ? Vaping Use: Not on file  ?Substance and Sexual Activity  ? Alcohol use: No  ? Drug use: No  ? Sexual activity: Not on file  ?Other Topics Concern  ? Not on file  ?Social History Narrative  ? Not on file  ? ?Social Determinants of Health   ? ?Financial Resource Strain: Not on file  ?Food Insecurity: Not on file  ?Transportation Needs: Not on file  ?Physical Activity: Not on file  ?Stress: Not on file  ?Social Connections: Not on file  ?Intimate Partner Violence: Not on file  ? ? ?Family History  ?Problem Relation Age of Onset  ? Breast cancer Neg Hx   ? CAD Neg Hx   ? ? ? ?Current Outpatient Medications:  ?  fluconazole (DIFLUCAN) 100 MG tablet, Take 1 tablet (100 mg total) by mouth daily. Take 2 tablets on day 1, then 1 tablet daily, Disp: 8 tablet, Rfl: 0 ?  lidocaine (XYLOCAINE) 2 % solution, Use as directed 15 mLs in the mouth or throat as needed for mouth pain., Disp: 100 mL, Rfl: 0 ?  acetaminophen (TYLENOL) 500 MG tablet, Take 1,000 mg by mouth every 6 (six) hours as needed for moderate pain or headache., Disp: , Rfl:  ?  Ascorbic Acid (VITA-C PO), Take 500 mg by mouth daily. Unsure of dose, Disp: , Rfl:  ?  calcium carbonate (OSCAL) 1500 (600 Ca) MG TABS tablet, Take 1,500 mg by mouth 2 (two) times daily with a meal., Disp: , Rfl:  ?  dexamethasone (DECADRON) 4 MG tablet, Take 1 tablet (4 mg total) by mouth 2 (two) times daily with a meal., Disp: 60 tablet, Rfl: 1 ?  dicyclomine (BENTYL) 10 MG capsule, Take 1 capsule (10 mg total) by mouth in the morning and at bedtime. As needed (Patient taking differently: Take 10 mg by mouth 2 (two) times daily as needed for spasms.), Disp: 90 capsule, Rfl: 0 ?  diltiazem (CARDIZEM CD) 120 MG 24 hr capsule, Take 1 capsule (120 mg total) by mouth daily., Disp: 60 capsule, Rfl: 3 ?  famotidine (PEPCID) 20 MG tablet, Take 1 tablet (20 mg total) by mouth 2 (two) times daily., Disp: 60 tablet, Rfl: 3 ?  levETIRAcetam (KEPPRA) 500 MG tablet, Take 1 tablet (500 mg total) by mouth 2 (two) times daily., Disp: 60 tablet, Rfl: 2 ?  ondansetron (ZOFRAN) 8 MG tablet, Take 1 tablet (8 mg total) by mouth 2 (two) times daily as needed (nausea and vomiting). May take 30-60 minutes prior to Temodar administration if  nausea/vomiting occurs., Disp: 30 tablet, Rfl: 1 ?  temozolomide (TEMODAR) 100 MG capsule, Take 1 capsule (100 mg total) by mouth daily. May take on an empty stomach to decrease nausea & vomiting., Disp: 42 capsule, Rfl: 0 ?  VITAMIN D PO, Take 1,000 Units by mouth daily., Disp: , Rfl:  ? ?PHYSICAL EXAM: ?ECOG FS:2 - Symptomatic, <50% confined to bed ? ?  ?Vitals:  ? 12/16/21 1248  ?BP: 118/70  ?Pulse: (!) 58  ?Resp: 16  ?Temp: 97.8 ?F (36.6 ?C)  ?TempSrc: Axillary  ?SpO2: 98%  ?Height: 5' (1.524 m)  ? ?Physical Exam ?Vitals and nursing note reviewed.  ?Constitutional:   ?   Appearance: She is well-developed. She is not ill-appearing  or toxic-appearing.  ?HENT:  ?   Head: Normocephalic and atraumatic.  ?   Nose: Nose normal.  ?   Mouth/Throat:  ?   Mouth: Mucous membranes are dry.  ?   Comments: Thick white coating on tongue and buccal mucosa. No open sores or bleeding ?Eyes:  ?   General: No scleral icterus.    ?   Right eye: No discharge.     ?   Left eye: No discharge.  ?   Conjunctiva/sclera: Conjunctivae normal.  ?Neck:  ?   Vascular: No JVD.  ?Cardiovascular:  ?   Rate and Rhythm: Regular rhythm. Tachycardia present.  ?   Pulses: Normal pulses.     ?     Dorsalis pedis pulses are 2+ on the right side and 2+ on the left side.  ?   Heart sounds: Normal heart sounds.  ?Pulmonary:  ?   Effort: Pulmonary effort is normal.  ?   Breath sounds: Normal breath sounds.  ?Abdominal:  ?   General: There is no distension.  ?Musculoskeletal:     ?   General: Normal range of motion.  ?   Cervical back: Normal range of motion.  ?   Left lower leg: 1+ Edema present.  ?   Comments: Compartments soft in left lower extremity.  Negative Homans' sign.  No palpable cords  ?Skin: ?   General: Skin is warm and dry.  ?   Capillary Refill: Capillary refill takes less than 2 seconds.  ?   Comments: Equal tactile temperature in all extremities  ?Neurological:  ?   Mental Status: She is oriented to person, place, and time.  ?   GCS: GCS eye  subscore is 4. GCS verbal subscore is 5. GCS motor subscore is 6.  ?   Comments: Fluent speech, no facial droop.  ?Psychiatric:     ?   Behavior: Behavior normal.  ?  ? ? ? ?LABORATORY DATA: ?I have reviewed t

## 2021-12-16 NOTE — ED Notes (Signed)
Malin, daughter.  ?

## 2021-12-16 NOTE — Progress Notes (Signed)
BLE venous duplex has been completed.  Preliminary results given to Dr. Roderic Palau. ? ?Results can be found under chart review under CV PROC. ?12/16/2021 4:15 PM ?Laurene Melendrez RVT, RDMS ? ?

## 2021-12-16 NOTE — Discharge Instructions (Signed)
Pick up your medicine at the drugstore for your mouth and your doctor will call you tomorrow and arrange follow-up this week ?

## 2021-12-17 ENCOUNTER — Telehealth: Payer: Self-pay | Admitting: Internal Medicine

## 2021-12-17 NOTE — Telephone Encounter (Signed)
Per 4/27 sch msg called and spoke to pt daughter about ED f/u.  Pt daughter confirmed appointment  ?

## 2021-12-18 NOTE — Therapy (Incomplete)
?OUTPATIENT PHYSICAL THERAPY NEURO EVALUATION ? ? ?Patient Name: Patricia Stevenson ?MRN: [id number] ?DOB:[birth year], 61 y.o., female ?Today's Date: 12/18/2021 ? ?PCP: Isaac Bliss, Rayford Halsted, MD ?REFERRING PROVIDER: Ventura Sellers, MD ? ? ? ?Past Medical History:  ?Diagnosis Date  ? GERD (gastroesophageal reflux disease)   ? Hyperlipidemia   ? Vitamin D deficiency   ? ?Past Surgical History:  ?Procedure Laterality Date  ? APPLICATION OF CRANIAL NAVIGATION Right 09/15/2021  ? Procedure: APPLICATION OF CRANIAL NAVIGATION;  Surgeon: Vallarie Mare, MD;  Location: [place];  Service: Neurosurgery;  Laterality: Right;  ? BREAST BIOPSY Left   ? 20 years ago in [place]- benign- not sure if bx or cyst  ? CHOLECYSTECTOMY    ? Laparoscopic cholecystectomy 04/29/2021  ? CRANIOTOMY Right 09/15/2021  ? Procedure: Right Craniotomy Temporal Lobectomy with Brain Lab;  Surgeon: Vallarie Mare, MD;  Location: [place];  Service: Neurosurgery;  Laterality: Right;  ? ?Patient Active Problem List  ? Diagnosis Date Noted  ? Left-sided weakness 10/27/2021  ? Paroxysmal atrial fibrillation ([place]) 10/27/2021  ? Atrial fibrillation with RVR ([place]) 09/16/2021  ? Hypertension 09/16/2021  ? 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 Vitamin D deficiency 05/23/2020  ? AMENORRHEA 03/13/2009  ? ? ?ONSET DATE: *** ? 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 ?THERAPY DIAG:  ?No diagnosis found. ? ?SUBJECTIVE:  ?                                                                                                                                                                                           ? ?SUBJECTIVE STATEMENT: ?*** ?Pt accompanied by: {accompnied:27141} ? ?PERTINENT HISTORY: *** ? ?PAIN:  ?Are you having pain? {OPRCPAIN:27236} ? ?PRECAUTIONS: {Therapy precautions:24002} ? ?WEIGHT BEARING RESTRICTIONS {Yes ***/No:24003} ? ?FALLS: Has patient  fallen in last 6 months? {fallsyesno:27318} ? ?LIVING ENVIRONMENT: ?Lives with: {[facility] lives with:25569::"lives with their family"} ?Lives in: {Lives in:25570} ?Stairs: {opstairs:27293} ?Has following equipment at home: {Assistive devices:23999} ? ?PLOF: {PLOF:24004} ? ?PATIENT GOALS *** ? ?OBJECTIVE:  ? ?DIAGNOSTIC FINDINGS: *** ? ?COGNITION: ?Overall cognitive status: {cognition:24006} ?  ?SENSATION: ?{sensation:27233} ? ?COORDINATION: ?*** ? ?EDEMA:  ?{edema:24020} ? ?MUSCLE TONE: {LE tone:25568} ? ? ?MUSCLE LENGTH: ?Hamstrings: Right *** deg; Left *** deg ?Thomas test: Right *** deg; Left *** deg ? ?DTRs:  ?{DTR SITE:24025} ? ?POSTURE: {posture:25561} ? ?LE ROM:    ? ?{AROM/PROM:27142}  Right ?12/18/2021 Left ?12/18/2021  ?Hip flexion    ?Hip extension    ?Hip abduction    ?Hip adduction    ?  Hip internal rotation    ?Hip external rotation    ?Knee flexion    ?Knee extension    ?Ankle dorsiflexion    ?Ankle plantarflexion    ?Ankle inversion    ?Ankle eversion    ? (Blank rows = not tested) ? ?MMT:   ? ?MMT Right ?12/18/2021 Left ?12/18/2021  ?Hip flexion    ?Hip extension    ?Hip abduction    ?Hip adduction    ?Hip internal rotation    ?Hip external rotation    ?Knee flexion    ?Knee extension    ?Ankle dorsiflexion    ?Ankle plantarflexion    ?Ankle inversion    ?Ankle eversion    ?(Blank rows = not tested) ? ?BED MOBILITY:  ?{Bed mobility:24027} ? ?TRANSFERS: ?Assistive device utilized: {Assistive devices:23999}  ?Sit to stand: {Levels of assistance:24026} ?Stand to sit: {Levels of assistance:24026} ?Chair to chair: {Levels of assistance:24026} ?Floor: {Levels of assistance:24026} ? ?RAMP:  ?Level of Assistance: {Levels of assistance:24026} ?Assistive device utilized: {Assistive devices:23999} ?Ramp Comments: *** ? ?CURB:  ?Level of Assistance: {Levels of assistance:24026} ?Assistive device utilized: {Assistive devices:23999} ?Curb Comments: *** ? ?STAIRS: ? Level of Assistance: {Levels of assistance:24026} ? Stair  Negotiation Technique: {Stair Technique:27161} with {Rail Assistance:27162} ? Number of Stairs: ***  ? Height of Stairs: ***  ?Comments: *** ? ?GAIT: ?Gait pattern: {gait characteristics:25376} ?Distance walked: *** ?Assistive device utilized: {Assistive devices:23999} ?Level of assistance: {Levels of assistance:24026} ?Comments: *** ? ?FUNCTIONAL TESTs:  ?{Functional tests:24029} ? ? ? ?TODAY'S TREATMENT:  ?*** ? ? ?PATIENT EDUCATION: ?Education details: *** ?Person educated: {Person educated:25204} ?Education method: {Education Method:25205} ?Education comprehension: {Education Comprehension:25206} ? ? ?HOME EXERCISE PROGRAM: ?*** ? ? ?GOALS: ?Goals reviewed with patient? Yes ? ?SHORT TERM GOALS: Target date: {follow up:25551} ? ?Patient to be independent with initial HEP. ?Baseline: HEP initiated ?Goal status: INITIAL ? ? ? ?LONG TERM GOALS: Target date: {follow up:25551} ? ?Patient to be independent with advanced HEP. ?Baseline: Not yet initiated  ?Goal status: INITIAL ? ?Patient to demonstrate B LE strength >/=4+/5.  ?Baseline: See above ?Goal status: INITIAL ? ?Patient to demonstrate *** ROM WFL and without pain limiting.  ?Baseline: *** ?Goal status: INITIAL ? ?Patient to report and demonstrate improved head, neck, and shoulder posture at rest and with activity.  ?Baseline: *** ?Goal status: INITIAL ? ?Patient to demonstrate alternating reciprocal pattern when ascending and descending stairs with good stability and 1 handrail as needed.   ?Baseline: Unable ?Goal status: INITIAL ? ?Patient to score at least 20/24 on DGI in order to decrease risk of falls.  ?Baseline: *** ?Goal status: INITIAL ? ?Patient to complete TUG in <14 sec with LRAD in order to decrease risk of falls.   ?Baseline: *** ?Goal status: INITIAL ? ?Patient to demonstrate 5xSTS test in <15 sec in order to decrease risk of falls.  ?Baseline: *** ?Goal status: INITIAL ? ?Patient to score at least ***/56 on Berg in order to decrease risk of  falls.  ?Baseline: *** ?Goal status: INITIAL ? ? ? ? ?ASSESSMENT: ? ?CLINICAL IMPRESSION: ? ?Patient is a 61 y/o F presenting to [facility] with c/o *** for the past *** ? ?Patient today presenting with ***.  ? ? ?Patient was educated on gentle *** HEP and reported understanding. Prior to current episode, patient was independent. Would benefit from skilled PT services *** x/week for *** weeks to address aforementioned impairments in order to optimize level of function.   ? ? ?OBJECTIVE IMPAIRMENTS {opptimpairments:25111}.  ? ?  ACTIVITY LIMITATIONS {activity limitations:25113}.  ? ?PERSONAL FACTORS {Personal factors:25162} are also affecting patient's functional outcome.  ? ? ?REHAB POTENTIAL: {rehabpotential:25112} ? ?CLINICAL DECISION MAKING: {clinical decision making:25114} ? ?EVALUATION COMPLEXITY: {Evaluation complexity:25115} ? ?PLAN: ?PT FREQUENCY: {rehab frequency:25116} ? ?PT DURATION: {rehab duration:25117} ? ?PLANNED INTERVENTIONS: {rehab planned interventions:25118::"Therapeutic exercises","Therapeutic activity","Neuromuscular re-education","Balance training","Gait training","Patient/Family education","Joint mobilization"} ? ?PLAN FOR NEXT SESSION: *** ? ? ?Manuela Neptune, PT ?12/18/2021, 9:11 AM ? ? ? ? ? ? ? ?

## 2021-12-21 ENCOUNTER — Encounter: Payer: Self-pay | Admitting: Physical Therapy

## 2021-12-21 ENCOUNTER — Ambulatory Visit: Payer: 59 | Admitting: Occupational Therapy

## 2021-12-21 ENCOUNTER — Ambulatory Visit: Payer: 59 | Attending: Internal Medicine | Admitting: Physical Therapy

## 2021-12-21 ENCOUNTER — Encounter: Payer: Self-pay | Admitting: Occupational Therapy

## 2021-12-21 DIAGNOSIS — R2689 Other abnormalities of gait and mobility: Secondary | ICD-10-CM | POA: Insufficient documentation

## 2021-12-21 DIAGNOSIS — R2681 Unsteadiness on feet: Secondary | ICD-10-CM | POA: Diagnosis not present

## 2021-12-21 DIAGNOSIS — R208 Other disturbances of skin sensation: Secondary | ICD-10-CM

## 2021-12-21 DIAGNOSIS — R278 Other lack of coordination: Secondary | ICD-10-CM | POA: Insufficient documentation

## 2021-12-21 DIAGNOSIS — M6281 Muscle weakness (generalized): Secondary | ICD-10-CM | POA: Insufficient documentation

## 2021-12-21 DIAGNOSIS — C719 Malignant neoplasm of brain, unspecified: Secondary | ICD-10-CM | POA: Insufficient documentation

## 2021-12-21 NOTE — Therapy (Signed)
[facility] ?[address], [address] ?[place], [place], [address] ?Phone: [phone]   Fax:  [phone] ? ?Occupational Therapy Evaluation ? ?Patient Details  ?Name: Patricia Stevenson ?MRN: [id number] ?Date of Birth: [birth year] ?Referring Provider (OT): Mickeal Skinner, Acey Lav, MD ? ? ?Encounter Date: 12/21/2021 ? ? OT End of Session - 12/21/21 1646   ? ? Visit Number 1   ? Number of Visits 17   ? Date for OT Re-Evaluation 02/19/22   ? Authorization Type [employer]   ? OT Start Time 1148   ? OT Stop Time 1236   ? OT Time Calculation (min) 48 min   ? Activity Tolerance Patient tolerated treatment well   ? Behavior During Therapy WFL for tasks assessed/performed;Flat affect   ? ?  ?  ? ?  ? ? ?Past Medical History:  ?Diagnosis Date  ? GERD (gastroesophageal reflux disease)   ? Hyperlipidemia   ? Vitamin D deficiency   ? ? ?Past Surgical History:  ?Procedure Laterality Date  ? APPLICATION OF CRANIAL NAVIGATION Right 09/15/2021  ? Procedure: APPLICATION OF CRANIAL NAVIGATION;  Surgeon: Vallarie Mare, MD;  Location: [facility];  Service: Neurosurgery;  Laterality: Right;  ? BREAST BIOPSY Left   ? 20 years ago in [place]- benign- not sure if bx or cyst  ? CHOLECYSTECTOMY    ? Laparoscopic cholecystectomy 04/29/2021  ? CRANIOTOMY Right 09/15/2021  ? Procedure: Right Craniotomy Temporal Lobectomy with Brain Lab;  Surgeon: Vallarie Mare, MD;  Location: [facility];  Service: Neurosurgery;  Laterality: Right;  ? ? ?There were no vitals filed for this visit. ? ? Subjective Assessment - 12/21/21 1152   ? ? Subjective  Pt reports that she doesn't feel as strong as she used to.  Pt reports feeling tired when she does more or goes out walking.   ? Pertinent History HTN, HLD, has latex allergy   ? Patient Stated Goals to get stronger   ? Currently in Pain? No/denies   ? ?  ?  ? ?  ? ? ? ? [facility] OT Assessment - 12/21/21 1156   ? ?  ? Assessment  ? 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 Referring Provider (OT) Ventura Sellers, MD   ? Onset Date/Surgical Date 12/15/21   referral date  ? Hand Dominance Right   ? Next MD Visit 12/22/21   ? Prior Therapy no   ?  ? Precautions  ? Precautions Fall   ? Precaution Comments latex allergy   ?  ? Balance Screen  ? Has the patient fallen in the past 6 months Yes   ? How many times? 2   ? Has the patient had a decrease in activity level because of a fear of falling?  Yes   ? Is the patient reluctant to leave their home because of a fear of falling?  No   ?  ? Home  Environment  ? Family/patient expects to be discharged to: Private residence   ? Living Arrangements Spouse/significant other   2 sisters and husband  ? Available Help at Discharge Available 24 hours/day   ? Type of Home Aartment   ? Home Access Stairs   [address] ste[address], 2nd floor apt  ? Home Layout One level   ? Bathroom Shower/Tub Tub/Shower unit   ? Bathroom Toilet Standard   ? How accessible Accessible via walker   ? Home Equipment Tub  bench;Walker - 2 wheels;Grab bars - tub/shower;Hand held shower head   adjustable bed  ?  ? Prior Function  ? Level of Independence Independent   ? 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 Leisure enjoys watching fish swim in aquarium   ?  ? ADL  ? Eating/Feeding Independent   ? Grooming Independent   ? Upper Body Bathing Independent   ? Lower Body Bathing Independent   ? Upper Body Dressing Increased time   ? Lower Body Dressing Minimal assistance   occasional assist with donning pants  ? Toilet Transfer Modified independent   ? Toileting - Clothing Manipulation Modified independent   ? Toileting -  Hygiene Modified Independent   ? Tub/Shower Transfer Modified independent   ? Tub/Shower Information systems manager bench   ?  ? IADL  ? Light Housekeeping Does personal laundry completely   ? Meal Prep Does not utilize stove or oven;Able to complete simple cold meal and snack prep   ? Community Mobility Relies on family or  friends for transportation   ? Medication Management Is not capable of dispensing or managing own medication   ?  ? Written Expression  ? Dominant Hand Right   ?  ? Vision - History  ? Baseline Vision Wears glasses only for reading   ? Patient Visual Report Unable to keep objects in focus   reports does not see as well with R eye  ? Additional Comments noted mild L inattention during functional ambulation   ?  ? Vision Assessment  ? Vision Assessment Vision impaired  _ to be further tested in functional context   ?  ? Sensation  ? Light Touch Appears Intact   ?  ? Coordination  ? Finger Nose Finger Test slow speed bilaterally   ? 9 Hole Peg Test Right;Left   ? Right 9 Hole Peg Test 35.22   ? Left 9 Hole Peg Test 2:43.76   ? Box and Blocks R: 32 and L: 22   ? Coordination decreased speed with RAM   ?  ? ROM / Strength  ? AROM / PROM / Strength AROM;Strength   ?  ? AROM  ? Overall AROM  Deficits   ? Overall AROM Comments R shoulder 110*, LUE WNL   ?  ? Strength  ? Overall Strength Deficits   ? Strength Assessment Site Shoulder;Elbow   ? Right/Left Shoulder Right;Left   ? Right Shoulder Flexion 4/5   ? Left Shoulder Flexion 3-/5   ? Right/Left Elbow Right;Left   ? Right Elbow Flexion 4/5   ? Right Elbow Extension 4/5   ? Left Elbow Flexion 4-/5   ? Left Elbow Extension 4-/5   ?  ? Hand Function  ? Right Hand Grip (lbs) 25   ? Right Hand Lateral Pinch 10 lbs   ? Right Hand 3 Point Pinch 6 lbs   ? Left Hand Grip (lbs) 10   ? Left Hand Lateral Pinch 6 lbs   ? Left 3 point pinch 4 lbs   ? ?  ?  ? ?  ? ? ? ? ? ? ? ? ? ? ? ? ? ? ? ? ? ? ? OT Education - 12/21/21 1646   ? ? Education Details Educated on role and purpose of OT as well as potential interventions and goals for therapy based on initial evaluation findings.   ? Person(s) Educated Patient   ? Methods Explanation   ?  Comprehension Verbalized understanding   ? ?  ?  ? ?  ? ? ? OT Short Term Goals - 12/22/21 0816   ? ?  ? OT SHORT TERM GOAL #1  ? Title Pt will be  independent with HEP for [facility] to increase coordination as needed for ADLs and IADLs.   ? Time 4   ? Period Weeks   ? Status New   ? Target Date 01/22/22   ?  ? OT SHORT TERM GOAL #2  ? Title Pt will increase 9 hole peg test by 15 seconds with LUE to demonstrate increased coordination at needed to complete ADLs and IADLs.   ? Baseline R: 35.22 and L: 2:43.76   ? Time 4   ? Period Weeks   ? Status New   ?  ? OT SHORT TERM GOAL #3  ? Title Pt will improved L hand grip strength by 5# to demonstrate increased sustained grasp as needed for ADLs and IADLs   ? Baseline R: 25 and L: 10   ? ?  ?  ? ?  ? ? ? ? OT Long Term Goals - 12/22/21 0819   ? ?  ? OT LONG TERM GOAL #1  ? Title Pt will increase 9 hole peg test by 20 seconds with LUE to demonstrate increased coordination at needed to complete ADLs and IADLs.   ? Baseline R: 35.22 and L: 2:43.76   ? Time 8   ? Period Weeks   ? Status New   ? Target Date 02/19/22   ?  ? OT LONG TERM GOAL #2  ? Title Pt will be able to manage clothing fasteners with increased coordination as evidenced by an improvement of button time of 5 seconds.   ? Baseline time TBD   ? Time 8   ? Period Weeks   ? Status New   ?  ? OT LONG TERM GOAL #3  ? Title Pt will demonstrate ability to retrieve a lightweight object at moderate range to demonstrate increased UE strength and ROM as need to complete ADLs and IADLs.   ? Baseline L shoulder 110*   ? Time 8   ? Period Weeks   ? Status New   ?  ? OT LONG TERM GOAL #4  ? Title Pt will verbalize understanding of strategies to compensate for impaired sensation.   ? Time 8   ? Period Weeks   ? Status New   ?  ? OT LONG TERM GOAL #5  ? Title Pt will demonstrate improved visual scanning to L during functional mobility to decrease fall risk   ? Baseline L inattention with mobility   ? Time 8   ? Period Weeks   ? Status New   ? ?  ?  ? ?  ? ? ? ? ? ? ? ? Plan - 12/22/21 3295   ? ? 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 Pt currently lives with spouse and 2 sisters who assist with mobility and IADLs. PMHx includes HTN, HLD. Pt will benefit from skilled occupational therapy services to address s

## 2021-12-22 ENCOUNTER — Inpatient Hospital Stay: Payer: 59 | Attending: Internal Medicine | Admitting: Internal Medicine

## 2021-12-22 ENCOUNTER — Inpatient Hospital Stay: Payer: 59

## 2021-12-22 ENCOUNTER — Other Ambulatory Visit: Payer: Self-pay

## 2021-12-22 VITALS — BP 117/83 | HR 65 | Temp 97.7°F | Resp 18 | Wt 115.2 lb

## 2021-12-22 DIAGNOSIS — D696 Thrombocytopenia, unspecified: Secondary | ICD-10-CM | POA: Insufficient documentation

## 2021-12-22 DIAGNOSIS — C712 Malignant neoplasm of temporal lobe: Secondary | ICD-10-CM | POA: Diagnosis present

## 2021-12-22 DIAGNOSIS — Z7952 Long term (current) use of systemic steroids: Secondary | ICD-10-CM | POA: Insufficient documentation

## 2021-12-22 DIAGNOSIS — R531 Weakness: Secondary | ICD-10-CM | POA: Diagnosis not present

## 2021-12-22 DIAGNOSIS — C719 Malignant neoplasm of brain, unspecified: Secondary | ICD-10-CM | POA: Diagnosis not present

## 2021-12-22 DIAGNOSIS — D72810 Lymphocytopenia: Secondary | ICD-10-CM | POA: Insufficient documentation

## 2021-12-22 DIAGNOSIS — Z79899 Other long term (current) drug therapy: Secondary | ICD-10-CM | POA: Insufficient documentation

## 2021-12-22 LAB — CBC WITH DIFFERENTIAL (CANCER CENTER ONLY)
Abs Immature Granulocytes: 0.03 10*3/uL (ref 0.00–0.07)
Basophils Absolute: 0 10*3/uL (ref 0.0–0.1)
Basophils Relative: 0 %
Eosinophils Absolute: 0 10*3/uL (ref 0.0–0.5)
Eosinophils Relative: 0 %
HCT: 36.2 % (ref 36.0–46.0)
Hemoglobin: 12.9 g/dL (ref 12.0–15.0)
Immature Granulocytes: 1 %
Lymphocytes Relative: 6 %
Lymphs Abs: 0.2 10*3/uL — ABNORMAL LOW (ref 0.7–4.0)
MCH: 32 pg (ref 26.0–34.0)
MCHC: 35.6 g/dL (ref 30.0–36.0)
MCV: 89.8 fL (ref 80.0–100.0)
Monocytes Absolute: 0.1 10*3/uL (ref 0.1–1.0)
Monocytes Relative: 6 %
Neutro Abs: 2.2 10*3/uL (ref 1.7–7.7)
Neutrophils Relative %: 87 %
Platelet Count: 23 10*3/uL — ABNORMAL LOW (ref 150–400)
RBC: 4.03 MIL/uL (ref 3.87–5.11)
RDW: 15.4 % (ref 11.5–15.5)
WBC Count: 2.5 10*3/uL — ABNORMAL LOW (ref 4.0–10.5)
nRBC: 0 % (ref 0.0–0.2)

## 2021-12-22 LAB — CMP (CANCER CENTER ONLY)
ALT: 73 U/L — ABNORMAL HIGH (ref 0–44)
AST: 18 U/L (ref 15–41)
Albumin: 3.8 g/dL (ref 3.5–5.0)
Alkaline Phosphatase: 57 U/L (ref 38–126)
Anion gap: 6 (ref 5–15)
BUN: 24 mg/dL — ABNORMAL HIGH (ref 6–20)
CO2: 27 mmol/L (ref 22–32)
Calcium: 9.3 mg/dL (ref 8.9–10.3)
Chloride: 98 mmol/L (ref 98–111)
Creatinine: 0.49 mg/dL (ref 0.44–1.00)
GFR, Estimated: >60 mL/min (ref >60)
Glucose, Bld: 124 mg/dL — ABNORMAL HIGH (ref 70–99)
Potassium: 4.8 mmol/L (ref 3.5–5.1)
Sodium: 131 mmol/L — ABNORMAL LOW (ref 135–145)
Total Bilirubin: 0.4 mg/dL (ref 0.3–1.2)
Total Protein: 6.2 g/dL — ABNORMAL LOW (ref 6.5–8.1)

## 2021-12-22 LAB — SAMPLE TO BLOOD BANK

## 2021-12-22 NOTE — Progress Notes (Signed)
? ?[facility] [address]  ?[place], [place] [address] ?[phone] ? ? ?Interval Evaluation ? ?Date of Service: 12/22/21 ?Patient Name: Patricia Stevenson ?Patient MRN: [id number] ?Patient DOB: 1961-08-07 ?Provider: Ventura Sellers, MD ? ?Identifying Statement:  ?Patricia Stevenson is a 61 y.o. female with right temporal  high grade glioma   ? 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 ?  ?09/15/2021 Cancer Staging  ? Staging form: Brain and Spinal Cord, AJCC Version 9 ?- Clinical stage from 09/15/2021: No stage assigned - Signed by Ventura Sellers, MD on 10/08/2021 ?Histopathologic type: Astrocytoma, NOS ?Stage prefix: Initial diagnosis ? ?  ?10/19/2021 -  Chemotherapy  ? Patient is on Treatment Plan : BRAIN GLIOBLASTOMA Radiation Therapy With Concurrent Temozolomide 75 mg/m2 Daily Followed By Sequential Maintenance Temozolomide x 6-12 cycles  ? ?  ?  ? ? ?Biomarkers: ? ?MGMT Unknown.  ?IDH 1/2 Wild type.  ?EGFR Unknown  ?TERT Unknown  ? ?Interval History: ?Patricia Stevenson presents for follow up after recent ED visit for shortness of breath, leg swelling.  She had been worked up for DVT/PE which was negative.  Left sided weakness is stable.  Walking mainly with a walker right now. Daughter has noted she is more "disconnected" and spaced out that normal.  Decadron is at 57m BID currently.  No further seizures. ? ?Medications: ?Current Outpatient Medications on File Prior to Visit  ?Medication Sig Dispense Refill  ? acetaminophen (TYLENOL) 500 MG tablet Take 1,000 mg by mouth every 6 (six) hours as needed for moderate pain or headache.    ? Ascorbic Acid (VITA-C PO) Take 500 mg by mouth daily. Unsure of dose    ? calcium carbonate (OSCAL) 1500 (600 Ca) MG TABS tablet Take 1,500 mg by mouth 2 (two) times daily with a meal.    ? dexamethasone  (DECADRON) 4 MG tablet Take 1 tablet (4 mg total) by mouth 2 (two) times daily with a meal. 60 tablet 1  ? dicyclomine (BENTYL) 10 MG capsule Take 1 capsule (10 mg total) by mouth in the morning and at bedtime. As needed (Patient taking differently: Take 10 mg by mouth 2 (two) times daily as needed for spasms.) 90 capsule 0  ? diltiazem (CARDIZEM CD) 120 MG 24 hr capsule Take 1 capsule (120 mg total) by mouth daily. 60 capsule 3  ? famotidine (PEPCID) 20 MG tablet Take 1 tablet (20 mg total) by mouth 2 (two) times daily. 60 tablet 3  ? fluconazole (DIFLUCAN) 100 MG tablet Take 1 tablet (100 mg total) by mouth daily. Take 2 tablets on day 1, then 1 tablet daily 8 tablet 0  ? levETIRAcetam (KEPPRA) 500 MG tablet Take 1 tablet (500 mg total) by mouth 2 (two) times daily. 60 tablet 2  ? lidocaine (XYLOCAINE) 2 % solution Use as directed 15 mLs in the mouth or throat as needed for mouth pain. 100 mL 0  ? ondansetron (ZOFRAN) 8 MG tablet Take 1 tablet (8 mg total) by mouth 2 (two) times daily as needed (nausea and vomiting). May take 30-60 minutes prior to Temodar administration if nausea/vomiting occurs. 30 tablet 1  ? temozolomide (TEMODAR) 100 MG capsule Take 1 capsule (100 mg total) by mouth daily. May take on an empty stomach to decrease nausea & vomiting. 42 capsule 0  ?  VITAMIN D PO Take 1,000 Units by mouth daily.    ? ?No current facility-administered medications on file prior to visit.  ? ? ?Allergies:  ?Allergies  ?Allergen Reactions  ? Latex   ? ?Past Medical History:  ?Past Medical History:  ?Diagnosis Date  ? GERD (gastroesophageal reflux disease)   ? Hyperlipidemia   ? Vitamin D deficiency   ? ?Past Surgical History:  ?Past Surgical History:  ?Procedure Laterality Date  ? APPLICATION OF CRANIAL NAVIGATION Right 09/15/2021  ? Procedure: APPLICATION OF CRANIAL NAVIGATION;  Surgeon: Vallarie Mare, MD;  Location: [facility];  Service: Neurosurgery;  Laterality: Right;  ? BREAST BIOPSY Left   ? 20 years ago in  [place]- benign- not sure if bx or cyst  ? CHOLECYSTECTOMY    ? Laparoscopic cholecystectomy 04/29/2021  ? CRANIOTOMY Right 09/15/2021  ? Procedure: Right Craniotomy Temporal Lobectomy with Brain Lab;  Surgeon: Vallarie Mare, MD;  Location: [facility];  Service: Neurosurgery;  Laterality: Right;  ? ?Social History:  ?Social History  ? ?Socioeconomic History  ? Marital status: Married  ?  Spouse name: Not on file  ? Number of children: Not on file  ? Years of education: Not on file  ? Highest education level: Not on file  ?Occupational History  ? Not on file  ?Tobacco Use  ? Smoking status: Never  ? Smokeless tobacco: Never  ?Vaping Use  ? Vaping Use: Not on file  ?Substance and Sexual Activity  ? Alcohol use: No  ? Drug use: No  ? Sexual activity: Not on file  ?Other Topics Concern  ? Not on file  ?Social History Narrative  ? Not on file  ? ?Social Determinants of Health  ? ?Financial Resource Strain: Not on file  ?Food Insecurity: Not on file  ?Transportation Needs: Not on file  ?Physical Activity: Not on file  ?Stress: Not on file  ?Social Connections: Not on file  ?Intimate Partner Violence: Not on file  ? ?Family History:  ?Family History  ?Problem Relation Age of Onset  ? Breast cancer Neg Hx   ? CAD Neg Hx   ? ? ?Review of Systems: ?Constitutional: Doesn't report fevers, chills or abnormal weight loss ?Eyes: Doesn't report blurriness of vision ?Ears, nose, mouth, throat, and face: Doesn't report sore throat ?Respiratory: Doesn't report cough, dyspnea or wheezes ?Cardiovascular: Doesn't report palpitation, chest discomfort  ?Gastrointestinal:  Doesn't report nausea, constipation, diarrhea ?GU: Doesn't report incontinence ?Skin: Doesn't report skin rashes ?Neurological: Per HPI ?Musculoskeletal: Doesn't report joint pain ?Behavioral/Psych: Doesn't report anxiety ? ?Physical Exam: ?Vitals:  ? 12/22/21 1434  ?BP: 117/83  ?Pulse: 65  ?Resp: 18  ?Temp: 97.7 ?F (36.5 ?C)  ?SpO2: 99%  ? ?KPS: 60. ?General: Alert,  cooperative, pleasant, in no acute distress ?Head: Normal ?EENT: No conjunctival injection or scleral icterus.  ?Lungs: Resp effort normal ?Cardiac: Regular rate ?Abdomen: Non-distended abdomen ?Skin: No rashes cyanosis or petechiae. ?Extremities: No clubbing or edema ? ?Neurologic Exam: ?Mental Status: Awake, alert, attentive to examiner. Oriented to self and environment. Language is fluent with intact comprehension.  Elements of neglect, agnosia. ?Cranial Nerves: Visual acuity is grossly normal. Visual fields are full. 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 Face is symmetric ?Motor: Tone and bulk are normal. Power is 4+/5 in left arm and 4/5 left leg. Reflexes are symmetric, no pathologic reflexes present.  ?Sensory: Intact to light touch ?Gait: Non ambulatory today ? ? ?Labs: ?I have reviewed the data as listed ?   ?  Component Value Date/Time  ? NA 134 (L) 12/16/2021 1124  ? K 4.2 12/16/2021 1124  ? CL 102 12/16/2021 1124  ? CO2 27 12/16/2021 1124  ? GLUCOSE 144 (H) 12/16/2021 1124  ? BUN 15 12/16/2021 1124  ? CREATININE 0.54 12/16/2021 1124  ? CREATININE 0.66 05/21/2020 0811  ? CALCIUM 9.2 12/16/2021 1124  ? PROT 6.5 12/16/2021 1124  ? ALBUMIN 3.9 12/16/2021 1124  ? AST 14 (L) 12/16/2021 1124  ? ALT 40 12/16/2021 1124  ? ALKPHOS 68 12/16/2021 1124  ? BILITOT 0.3 12/16/2021 1124  ? GFRNONAA >60 12/16/2021 1124  ? ?Lab Results  ?Component Value Date  ? WBC 2.9 (L) 12/16/2021  ? NEUTROABS 2.6 12/16/2021  ? HGB 12.6 12/16/2021  ? HCT 35.6 (L) 12/16/2021  ? MCV 91.5 12/16/2021  ? PLT 20 (L) 12/16/2021  ? ? 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 ?Left-sided weakness ? ?TORSHA LEMUS presents today with further clinical decline.  Objectively, she has increased left sided neglect, agnosia compared to prior.  Motor function is slightly worse in the leg.  She has not responded to steroids as we had hoped. ? ?Thrombocytopenia is slightly improved today but still more severe  than expected from low dose concurrent Temodar.  Also noted is leukopenia and lymphopenia.  We will continue to keep a close eye on the blood counts at follow up in 2 weeks.  ? ?Decadron should nonetheless remain a

## 2021-12-22 NOTE — Therapy (Signed)
[facility] ?[address], [address] ?[place], [place], [address] ?Phone: [phone]   Fax:  [phone] ? ?Physical Therapy Evaluation ? ?Patient Details  ?Name: Patricia Stevenson ?MRN: [id number] ?Date of Birth: 1961-03-09 ?Referring Provider (PT): Cecil Cobbs ? ? ?Encounter Date: 12/21/2021 ? ? PT End of Session - 12/22/21 0816   ? ? Visit Number 1   ? Number of Visits 16   ? Date for PT Re-Evaluation 02/12/22   ? Authorization Type Aetna   ? PT Start Time 1104   ? PT Stop Time 1146   ? PT Time Calculation (min) 42 min   ? Activity Tolerance Patient tolerated treatment well   ? Behavior During Therapy WFL for tasks assessed/performed;Flat affect   ? ?  ?  ? ?  ? ? ?Past Medical History:  ?Diagnosis Date  ? GERD (gastroesophageal reflux disease)   ? Hyperlipidemia   ? Vitamin D deficiency   ? ? ?Past Surgical History:  ?Procedure Laterality Date  ? APPLICATION OF CRANIAL NAVIGATION Right 09/15/2021  ? Procedure: APPLICATION OF CRANIAL NAVIGATION;  Surgeon: Vallarie Mare, MD;  Location: [facility];  Service: Neurosurgery;  Laterality: Right;  ? BREAST BIOPSY Left   ? 20 years ago in [place]- benign- not sure if bx or cyst  ? CHOLECYSTECTOMY    ? Laparoscopic cholecystectomy 04/29/2021  ? CRANIOTOMY Right 09/15/2021  ? Procedure: Right Craniotomy Temporal Lobectomy with Brain Lab;  Surgeon: Vallarie Mare, MD;  Location: [facility];  Service: Neurosurgery;  Laterality: Right;  ? ? ?There were no vitals filed for this visit. ? ? ? Subjective Assessment - 12/21/21 1117   ? ? Subjective Want to help with my leg; the right side shakes alot in the morning.  The shakiness is due to the position of the tumor.  The left side is weaker.  Had a fall on the sidewalk coming into therapy today; one other fall in the past 6 months getting out of the car.  Uses walker at times at home.   ? Patient is accompained by: Family member   sister  ? Patient Stated Goals To help the leg weakness.   ?  Currently in Pain? No/denies   ? ?  ?  ? ?  ? ? ? ? ? [facility] PT Assessment - 12/21/21 1121   ? ?  ? Assessment  ? Medical Diagnosis High grade gliobastoma, L weakness   ? Referring Provider (PT) Cecil Cobbs   ? Onset Date/Surgical Date 12/15/21   referral date  ? Hand Dominance Right   ? Next MD Visit 12/22/2021   ? Prior Therapy no   ?  ? Precautions  ? Precautions Fall;Other (comment)   ? Precaution Comments latex allergy   ?  ? Balance Screen  ? Has the patient fallen in the past 6 months Yes   ? How many times? 2   ? Has the patient had a decrease in activity level because of a fear of falling?  Yes   Per sister report; no per patient  ? Is the patient reluctant to leave their home because of a fear of falling?  No   ?  ? Home Environment  ? Living Environment Private residence   ? Living Arrangements Spouse/significant other   and her other sister  ? Available Help at Discharge Family   ? Type of Home Apartment   ? Home Access Stairs to enter   ?  Entrance Stairs-Number of Steps 15   ? Entrance Stairs-Rails Right   ? Home Layout One level   ? [place] - 2 wheels;Shower seat;Grab bars - toilet;Grab bars - tub/shower   ? Additional Comments Reports harder time going up the steps   ?  ? Prior Function  ? Level of Independence Independent   ? 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  ? Cognition  ? Overall Cognitive Status History of cognitive impairments - at baseline   looks at phone to check date  ?  ? Observation/Other Assessments  ? Focus on Therapeutic Outcomes (FOTO)  NA   ?  ? Sensation  ? Light Touch Appears Intact   occasional tingling L great toe  ?  ? ROM / Strength  ? AROM / PROM / Strength AROM;Strength   ?  ? AROM  ? Overall AROM  Deficits   ?  ? Strength  ? Overall Strength Deficits   ? Strength Assessment Site Hip;Knee;Ankle   ? Right/Left Hip Right;Left   ? Right Hip Flexion 3+/5   ? Left Hip Flexion 3-/5   ? Right/Left Knee Right;Left   ? Right Knee Flexion  3+/5   ? Right Knee Extension 4/5   ? Left Knee Flexion 3/5   ? Left Knee Extension 3/5   ? Right/Left Ankle Right;Left   ? Right Ankle Dorsiflexion 3/5   ? Left Ankle Dorsiflexion 3-/5   ?  ? Transfers  ? Transfers Sit to Stand;Stand to Sit   ? Sit to Stand 4: Min guard;With upper extremity assist;From bed   ? Stand to Sit 4: Min guard;With upper extremity assist;To bed   ? Transfer Cueing Cues for hand placement, as pt tends to try to push up from walker   ? Comments Attempted 5x:  unable.  Needs BUE support 2 reps in 35.97 sec   ?  ? Ambulation/Gait  ? Ambulation/Gait Yes   ? Ambulation/Gait Assistance 4: Min guard   ? Ambulation Distance (Feet) 60 Feet   ? Assistive device Rolling walker   ? Gait Pattern Step-through pattern;Decreased step length - left;Decreased stance time - left;Decreased dorsiflexion - left;Poor foot clearance - left   ? Ambulation Surface Level;Indoor   ? Gait velocity 30.87 sec =1.06 ft/sec   ?  ? Standardized Balance Assessment  ? Standardized Balance Assessment Timed Up and Go Test   ?  ? Timed Up and Go Test  ? Normal TUG (seconds) 36.1   ? TUG Comments Scores >13.5 sec indicate increased fall risk; >30 sec indicate difficulty with ADLs in the home.   ? ?  ?  ? ?  ? ? ? ? ? ? ? ? ? ? ? ? ? ?Objective measurements completed on examination: See above findings.  ? ? ? ? ? ? ? ? ? ? ? ? ? ? PT Education - 12/22/21 0816   ? ? Education Details Eval results, POC, recommendation to use RW at all times   ? Person(s) Educated Patient   sister  ? Methods Explanation   ? Comprehension Verbalized understanding   ? ?  ?  ? ?  ? ? ? PT Short Term Goals - 12/22/21 0826   ? ?  ? PT SHORT TERM GOAL #1  ? Title Pt will perform HEP with family supervision for improved strength, balance, transfers, and gait.  TARGET 01/15/2022   ? Time 4   ? Period Weeks   ?  Status New   ?  ? PT SHORT TERM GOAL #2  ? Title Pt will improve 5x sit<>stand to less than or equal to 30 sec to demonstrate improved functional  strength and transfer efficiency.   ? Time 4   ? Period Weeks   ? Status New   ? ?  ?  ? ?  ? ? ? ? PT Long Term Goals - 12/22/21 0828   ? ?  ? PT LONG TERM GOAL #1  ? Title Pt will perform HEP with family supervision for improved strength, balance, transfers, and gait.  TARGET 02/12/2022   ? Time 8   ? Period Weeks   ? Status New   ?  ? PT LONG TERM GOAL #2  ? Title Pt will improve 5x sit<>stand to less than or equal to 20 sec to demonstrate improved functional strength and transfer efficiency.   ? Time 8   ? Period Weeks   ? Status New   ?  ? PT LONG TERM GOAL #3  ? Title Pt will improve TUG score to less than or equal to 20 sec for decreased fall risk.   ? Baseline 36.1 sec   ? Time 8   ? Period Weeks   ? Status New   ?  ? PT LONG TERM GOAL #4  ? Title Pt will improve gait velocity to at least 1.5 ft/sec for improved gait efficiency and safety.   ? Baseline 1.06 ft/sec   ? Time 8   ? Period Weeks   ? Status New   ? ?  ?  ? ?  ? ? ? ? ? ? ? ? ? Plan - 12/22/21 0818   ? ? Clinical Impression Statement Pt is a 61 year old female who presents to OPPT with hx of glioblastoma, craniotomy 08/2021, with residual L sided deficits.  Per sister and patient report, she has completed current round of treatment and is seeking PT to work on improved strength and mobility.  Pt has had 2 falls in past 6 months.  Pt presents with decreased strength, decreased balance, decreased independence with gait, decreased L sided awareness with gait and turns, decreased safety awareness, decreased activity tolerance/endurance.  Pt is at fall risk per gait velocity and TUG scores.  Prior to surgery and treatment course, pt was independent and working.  She would benefit from skilled PT to address the above stated defictis for improved functional mobility and decreased fall risk.   ? Personal Factors and Comorbidities Comorbidity 2   ? Comorbidities HTN, HLD   ? Examination-Activity Limitations Locomotion Level;Transfers;Stairs;Stand   ?  Examination-Participation Restrictions Occupation;Community Activity   ? Stability/Clinical Decision Making Evolving/Moderate complexity   ? Clinical Decision Making Moderate   ? Rehab Potential Good   ? PT Frequency

## 2021-12-25 ENCOUNTER — Ambulatory Visit: Payer: 59 | Admitting: Occupational Therapy

## 2021-12-25 ENCOUNTER — Ambulatory Visit: Payer: 59 | Admitting: Physical Therapy

## 2021-12-25 ENCOUNTER — Telehealth: Payer: Self-pay | Admitting: *Deleted

## 2021-12-25 ENCOUNTER — Encounter: Payer: Self-pay | Admitting: Physical Therapy

## 2021-12-25 VITALS — BP 92/75 | HR 72

## 2021-12-25 DIAGNOSIS — R109 Unspecified abdominal pain: Secondary | ICD-10-CM | POA: Diagnosis not present

## 2021-12-25 DIAGNOSIS — R0609 Other forms of dyspnea: Secondary | ICD-10-CM | POA: Diagnosis not present

## 2021-12-25 DIAGNOSIS — R4182 Altered mental status, unspecified: Secondary | ICD-10-CM | POA: Diagnosis not present

## 2021-12-25 DIAGNOSIS — A419 Sepsis, unspecified organism: Secondary | ICD-10-CM | POA: Diagnosis not present

## 2021-12-25 DIAGNOSIS — R9431 Abnormal electrocardiogram [ECG] [EKG]: Secondary | ICD-10-CM | POA: Diagnosis not present

## 2021-12-25 DIAGNOSIS — E872 Acidosis, unspecified: Secondary | ICD-10-CM | POA: Diagnosis not present

## 2021-12-25 DIAGNOSIS — T68XXXA Hypothermia, initial encounter: Secondary | ICD-10-CM | POA: Diagnosis not present

## 2021-12-25 DIAGNOSIS — D61810 Antineoplastic chemotherapy induced pancytopenia: Secondary | ICD-10-CM | POA: Diagnosis not present

## 2021-12-25 NOTE — Therapy (Signed)
[facility] ?[address], [address] ?[place], [place], [address] ?Phone: [phone]   Fax:  [phone] ? ?Physical Therapy Treatment ? ?Patient Details  ?Name: Patricia Stevenson ?MRN: [id number] ?Date of Birth: August 27, 1960 ?Referring Provider (PT): Cecil Cobbs ? ? ?Encounter Date: 12/25/2021 ? ? PT End of Session - 12/25/21 0955   ? ? Visit Number 2   ? Number of Visits 16   ? Date for PT Re-Evaluation 02/12/22   ? Authorization Type Aetna   ? PT Start Time [phone]   ? PT Stop Time 0950   ? PT Time Calculation (min) 17 min   ? Equipment Utilized During Treatment Gait belt   ? Activity Tolerance Patient tolerated treatment well;Patient limited by fatigue;Patient limited by lethargy   ? Behavior During Therapy WFL for tasks assessed/performed;Flat affect   Lethargic  ? ?  ?  ? ?  ? ? ?Past Medical History:  ?Diagnosis Date  ? GERD (gastroesophageal reflux disease)   ? Hyperlipidemia   ? Vitamin D deficiency   ? ? ?Past Surgical History:  ?Procedure Laterality Date  ? APPLICATION OF CRANIAL NAVIGATION Right 09/15/2021  ? Procedure: APPLICATION OF CRANIAL NAVIGATION;  Surgeon: Vallarie Mare, MD;  Location: [facility];  Service: Neurosurgery;  Laterality: Right;  ? BREAST BIOPSY Left   ? 20 years ago in [place]- benign- not sure if bx or cyst  ? CHOLECYSTECTOMY    ? Laparoscopic cholecystectomy 04/29/2021  ? CRANIOTOMY Right 09/15/2021  ? Procedure: Right Craniotomy Temporal Lobectomy with Brain Lab;  Surgeon: Vallarie Mare, MD;  Location: [facility];  Service: Neurosurgery;  Laterality: Right;  ? ? ?Vitals:  ? 12/25/21 0954  ?BP: 92/75  ?Pulse: 72  ? ? ? Subjective Assessment - 12/25/21 0958   ? ? Subjective Sister reports intially they were not going to come (missed OT appt at 8:45), but at the last minute, pt decided she wanted to come.  Pt very tired and reports her body just hurts.   ? Patient is accompained by: Family member   sister  ? Patient Stated Goals To help the leg weakness.    ? Currently in Pain? Yes   Did not rate, just reports whole body hurts.  ? ?  ?  ? ?  ? ? ? ? ? ? ? ? ? ? ? ? ? ? ? ? ? ? ? ? [facility] Adult PT Treatment/Exercise - 12/25/21 0001   ? ?  ? Transfers  ? Transfers Sit to Stand;Stand to Sit;Squat Pivot Transfers   ? Sit to Stand Other (comment);2: Max assist   ? Sit to Stand Details (indicate cue type and reason) Attempted sit<>stand x 3 reps, and pt unable, reporting pain and fatigue   ? Stand to Sit Other (comment);2: Max assist   ? Squat Pivot Transfers 2: Max assist;With upper extremity assistance   ? Squat Pivot Transfer Details (indicate cue type and reason) chair>w/c   ? Comments Assisted sister with transfer to car; pt is max/total assistance for w/c>car transfer and positioning into seat of car.   ? ?  ?  ? ?  ? ? ? ? ? ? ? ? ? ? ? ? PT Short Term Goals - 12/22/21 0826   ? ?  ? PT SHORT TERM GOAL #1  ? Title Pt will perform HEP with family supervision for improved strength, balance, transfers, and gait.  TARGET 01/15/2022   ? Time 4   ?  Period Weeks   ? Status New   ?  ? PT SHORT TERM GOAL #2  ? Title Pt will improve 5x sit<>stand to less than or equal to 30 sec to demonstrate improved functional strength and transfer efficiency.   ? Time 4   ? Period Weeks   ? Status New   ? ?  ?  ? ?  ? ? ? ? PT Long Term Goals - 12/22/21 0828   ? ?  ? PT LONG TERM GOAL #1  ? Title Pt will perform HEP with family supervision for improved strength, balance, transfers, and gait.  TARGET 02/12/2022   ? Time 8   ? Period Weeks   ? Status New   ?  ? PT LONG TERM GOAL #2  ? Title Pt will improve 5x sit<>stand to less than or equal to 20 sec to demonstrate improved functional strength and transfer efficiency.   ? Time 8   ? Period Weeks   ? Status New   ?  ? PT LONG TERM GOAL #3  ? Title Pt will improve TUG score to less than or equal to 20 sec for decreased fall risk.   ? Baseline 36.1 sec   ? Time 8   ? Period Weeks   ? Status New   ?  ? PT LONG TERM GOAL #4  ? Title Pt will improve  gait velocity to at least 1.5 ft/sec for improved gait efficiency and safety.   ? Baseline 1.06 ft/sec   ? Time 8   ? Period Weeks   ? Status New   ? ?  ?  ? ?  ? ? ? ? ? ? ? ? Plan - 12/25/21 0959   ? ? Clinical Impression Statement Pt arrives today, in lobby, after walking in with her walker with sister.  (Pt arrives after scheduled OT appt at 8:45, which she missed).  Pt keeps eyes closed and reports multiple times that she is tired and whole body aches.  Blood pressure measure today is 92/75 in sitting.  Attempted several times sit>stand to walk into treatment area, but pt unable.  Sister reports that pt has not eaten/drank alot in the past few days and will be starting chemo again.  Unablet o complete full PT session, but assisted sister with transport to car, in w/c and sister performs w/c>car transfer with max assistance.  Educated pt/family to contact MD, and potentially to discuss again home health PT/OT, as patient experiencing hardship in getting to and being able to tolerated outpatient PT sessions.   ? Personal Factors and Comorbidities Comorbidity 2   ? Comorbidities HTN, HLD   ? Examination-Activity Limitations Locomotion Level;Transfers;Stairs;Stand   ? Examination-Participation Restrictions Occupation;Community Activity   ? Stability/Clinical Decision Making Evolving/Moderate complexity   ? Rehab Potential Good   ? PT Frequency 2x / week   ? PT Duration 8 weeks   including eval week  ? PT Treatment/Interventions ADLs/Self Care Home Management;Gait training;Stair training;Functional mobility training;Therapeutic activities;Therapeutic exercise;Balance training;Neuromuscular re-education;DME Instruction;Manual techniques;Patient/family education;Orthotic Fit/Training;Passive range of motion   ? PT Next Visit Plan Initiate HEP, walking program for home-HEP to address strengthening, balance, gait.  Work on functional strength, balance, and gait, make sure pt using RW at home.   ? Recommended Other  Services 12/25/2021: Potential follow up with Dr. Mickeal Skinner to discuss again HHPT and [facility] services if pt not able to tolerate OP PT and OT   ? Consulted and Agree with Plan of Care  Patient;Family member/caregiver   ? Family Member Consulted sister   ? ?  ?  ? ?  ? ? ?Patient will benefit from skilled therapeutic intervention in order to improve the following deficits and impairments:  Abnormal gait, Difficulty walking, Decreased activity tolerance, Decreased endurance, Decreased safety awareness, Decreased mobility, Decreased strength ? ?Visit Diagnosis: ?Muscle weakness (generalized) ? ? ? ? ?Problem List ?Patient Active Problem List  ? Diagnosis Date Noted  ? Left-sided weakness 10/27/2021  ? Paroxysmal atrial fibrillation ([facility]) 10/27/2021  ? Atrial fibrillation with RVR ([facility]) 09/16/2021  ? Hypertension 09/16/2021  ? 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 Vitamin D deficiency 05/23/2020  ? AMENORRHEA 03/13/2009  ? ? ?Judye Lorino W., PT ?12/25/2021, 10:03 AM ? ?[facility] ?[address], [address] ?[place], [place], [address] ?Phone: [phone]   Fax:  [phone] ? ?Name: BOBBI YOUNT ?MRN: [id number] ?Date of Birth: 02-20-1961 ? ? ? ?

## 2021-12-25 NOTE — Telephone Encounter (Signed)
Patient's daughter [name] Depaula called and requested a call from provider either today or tomorrow.  ? ?She is concerned that her mother is declining and is either unable or unwilling to move.  She stated her mother has to be encouraged to do almost everything now - even eating. Daughter is wondering if PT should continue or be stopped. ?Daughter also stated she wants to discuss what are the goals of mother having PT.  ? ?Daughter informed MD out of office today and would receive message via In[employer]asket.  ?Daughter verbalized understanding, stating it is not emergency and that she could wait until first of next week to speak with MD ?

## 2021-12-26 ENCOUNTER — Inpatient Hospital Stay (HOSPITAL_COMMUNITY)
Admission: EM | Admit: 2021-12-26 | Discharge: 2022-01-02 | DRG: 809 | Disposition: A | Discharge status: Home Health | Payer: 59 | Attending: Student | Admitting: Student

## 2021-12-26 ENCOUNTER — Emergency Department (HOSPITAL_COMMUNITY): Payer: 59

## 2021-12-26 ENCOUNTER — Encounter (HOSPITAL_COMMUNITY): Payer: Self-pay | Admitting: Emergency Medicine

## 2021-12-26 ENCOUNTER — Inpatient Hospital Stay (HOSPITAL_COMMUNITY): Payer: 59

## 2021-12-26 DIAGNOSIS — E871 Hypo-osmolality and hyponatremia: Secondary | ICD-10-CM | POA: Diagnosis not present

## 2021-12-26 DIAGNOSIS — I480 Paroxysmal atrial fibrillation: Secondary | ICD-10-CM | POA: Diagnosis present

## 2021-12-26 DIAGNOSIS — K589 Irritable bowel syndrome without diarrhea: Secondary | ICD-10-CM | POA: Diagnosis present

## 2021-12-26 DIAGNOSIS — I4891 Unspecified atrial fibrillation: Secondary | ICD-10-CM

## 2021-12-26 DIAGNOSIS — R5081 Fever presenting with conditions classified elsewhere: Secondary | ICD-10-CM | POA: Diagnosis present

## 2021-12-26 DIAGNOSIS — R404 Transient alteration of awareness: Secondary | ICD-10-CM | POA: Diagnosis not present

## 2021-12-26 DIAGNOSIS — T451X5A Adverse effect of antineoplastic and immunosuppressive drugs, initial encounter: Secondary | ICD-10-CM | POA: Diagnosis not present

## 2021-12-26 DIAGNOSIS — R5381 Other malaise: Secondary | ICD-10-CM

## 2021-12-26 DIAGNOSIS — J9811 Atelectasis: Secondary | ICD-10-CM | POA: Diagnosis not present

## 2021-12-26 DIAGNOSIS — E872 Acidosis, unspecified: Secondary | ICD-10-CM | POA: Diagnosis not present

## 2021-12-26 DIAGNOSIS — E559 Vitamin D deficiency, unspecified: Secondary | ICD-10-CM | POA: Diagnosis present

## 2021-12-26 DIAGNOSIS — Z79899 Other long term (current) drug therapy: Secondary | ICD-10-CM

## 2021-12-26 DIAGNOSIS — B370 Candidal stomatitis: Secondary | ICD-10-CM | POA: Diagnosis not present

## 2021-12-26 DIAGNOSIS — T461X5A Adverse effect of calcium-channel blockers, initial encounter: Secondary | ICD-10-CM | POA: Diagnosis not present

## 2021-12-26 DIAGNOSIS — R531 Weakness: Secondary | ICD-10-CM | POA: Diagnosis not present

## 2021-12-26 DIAGNOSIS — K219 Gastro-esophageal reflux disease without esophagitis: Secondary | ICD-10-CM | POA: Diagnosis present

## 2021-12-26 DIAGNOSIS — R6520 Severe sepsis without septic shock: Secondary | ICD-10-CM | POA: Diagnosis not present

## 2021-12-26 DIAGNOSIS — C719 Malignant neoplasm of brain, unspecified: Secondary | ICD-10-CM | POA: Diagnosis present

## 2021-12-26 DIAGNOSIS — D701 Agranulocytosis secondary to cancer chemotherapy: Secondary | ICD-10-CM | POA: Diagnosis not present

## 2021-12-26 DIAGNOSIS — I959 Hypotension, unspecified: Secondary | ICD-10-CM | POA: Diagnosis present

## 2021-12-26 DIAGNOSIS — A419 Sepsis, unspecified organism: Principal | ICD-10-CM

## 2021-12-26 DIAGNOSIS — E162 Hypoglycemia, unspecified: Secondary | ICD-10-CM | POA: Diagnosis not present

## 2021-12-26 DIAGNOSIS — D72810 Lymphocytopenia: Secondary | ICD-10-CM

## 2021-12-26 DIAGNOSIS — R001 Bradycardia, unspecified: Secondary | ICD-10-CM | POA: Diagnosis not present

## 2021-12-26 DIAGNOSIS — T68XXXA Hypothermia, initial encounter: Secondary | ICD-10-CM | POA: Diagnosis not present

## 2021-12-26 DIAGNOSIS — R4589 Other symptoms and signs involving emotional state: Secondary | ICD-10-CM | POA: Diagnosis not present

## 2021-12-26 DIAGNOSIS — N179 Acute kidney failure, unspecified: Secondary | ICD-10-CM | POA: Diagnosis not present

## 2021-12-26 DIAGNOSIS — I10 Essential (primary) hypertension: Secondary | ICD-10-CM

## 2021-12-26 DIAGNOSIS — D709 Neutropenia, unspecified: Secondary | ICD-10-CM | POA: Diagnosis present

## 2021-12-26 DIAGNOSIS — Z743 Need for continuous supervision: Secondary | ICD-10-CM | POA: Diagnosis not present

## 2021-12-26 DIAGNOSIS — R6510 Systemic inflammatory response syndrome (SIRS) of non-infectious origin without acute organ dysfunction: Secondary | ICD-10-CM | POA: Diagnosis not present

## 2021-12-26 DIAGNOSIS — R69 Illness, unspecified: Secondary | ICD-10-CM | POA: Diagnosis not present

## 2021-12-26 DIAGNOSIS — Z91040 Latex allergy status: Secondary | ICD-10-CM

## 2021-12-26 DIAGNOSIS — E785 Hyperlipidemia, unspecified: Secondary | ICD-10-CM | POA: Diagnosis present

## 2021-12-26 DIAGNOSIS — B379 Candidiasis, unspecified: Secondary | ICD-10-CM | POA: Diagnosis not present

## 2021-12-26 DIAGNOSIS — E161 Other hypoglycemia: Secondary | ICD-10-CM | POA: Diagnosis not present

## 2021-12-26 DIAGNOSIS — R4182 Altered mental status, unspecified: Secondary | ICD-10-CM | POA: Diagnosis not present

## 2021-12-26 DIAGNOSIS — R109 Unspecified abdominal pain: Secondary | ICD-10-CM | POA: Diagnosis not present

## 2021-12-26 DIAGNOSIS — E876 Hypokalemia: Secondary | ICD-10-CM | POA: Diagnosis not present

## 2021-12-26 DIAGNOSIS — R0609 Other forms of dyspnea: Secondary | ICD-10-CM | POA: Diagnosis not present

## 2021-12-26 DIAGNOSIS — D61818 Other pancytopenia: Secondary | ICD-10-CM

## 2021-12-26 DIAGNOSIS — R110 Nausea: Secondary | ICD-10-CM | POA: Diagnosis not present

## 2021-12-26 DIAGNOSIS — D61810 Antineoplastic chemotherapy induced pancytopenia: Principal | ICD-10-CM | POA: Diagnosis present

## 2021-12-26 DIAGNOSIS — R9431 Abnormal electrocardiogram [ECG] [EKG]: Secondary | ICD-10-CM | POA: Diagnosis not present

## 2021-12-26 LAB — COMPREHENSIVE METABOLIC PANEL
ALT: 80 U/L — ABNORMAL HIGH (ref 0–44)
AST: 25 U/L (ref 15–41)
Albumin: 2.9 g/dL — ABNORMAL LOW (ref 3.5–5.0)
Alkaline Phosphatase: 53 U/L (ref 38–126)
Anion gap: 7 (ref 5–15)
BUN: 23 mg/dL — ABNORMAL HIGH (ref 6–20)
CO2: 26 mmol/L (ref 22–32)
Calcium: 7.9 mg/dL — ABNORMAL LOW (ref 8.9–10.3)
Chloride: 99 mmol/L (ref 98–111)
Creatinine, Ser: 0.41 mg/dL — ABNORMAL LOW (ref 0.44–1.00)
GFR, Estimated: >60 mL/min (ref >60)
Glucose, Bld: 197 mg/dL — ABNORMAL HIGH (ref 70–99)
Potassium: 4.2 mmol/L (ref 3.5–5.1)
Sodium: 132 mmol/L — ABNORMAL LOW (ref 135–145)
Total Bilirubin: 0.6 mg/dL (ref 0.3–1.2)
Total Protein: 5.2 g/dL — ABNORMAL LOW (ref 6.5–8.1)

## 2021-12-26 LAB — CBC WITH DIFFERENTIAL/PLATELET
Abs Immature Granulocytes: 0.03 10*3/uL (ref 0.00–0.07)
Basophils Absolute: 0 10*3/uL (ref 0.0–0.1)
Basophils Relative: 0 %
Eosinophils Absolute: 0 10*3/uL (ref 0.0–0.5)
Eosinophils Relative: 0 %
HCT: 32.6 % — ABNORMAL LOW (ref 36.0–46.0)
Hemoglobin: 11.6 g/dL — ABNORMAL LOW (ref 12.0–15.0)
Immature Granulocytes: 2 %
Lymphocytes Relative: 4 %
Lymphs Abs: 0.1 10*3/uL — ABNORMAL LOW (ref 0.7–4.0)
MCH: 32.6 pg (ref 26.0–34.0)
MCHC: 35.6 g/dL (ref 30.0–36.0)
MCV: 91.6 fL (ref 80.0–100.0)
Monocytes Absolute: 0.1 10*3/uL (ref 0.1–1.0)
Monocytes Relative: 5 %
Neutro Abs: 1.7 10*3/uL (ref 1.7–7.7)
Neutrophils Relative %: 89 %
Platelets: 20 10*3/uL — CL (ref 150–400)
RBC: 3.56 MIL/uL — ABNORMAL LOW (ref 3.87–5.11)
RDW: 15.6 % — ABNORMAL HIGH (ref 11.5–15.5)
WBC: 1.9 10*3/uL — ABNORMAL LOW (ref 4.0–10.5)
nRBC: 0 % (ref 0.0–0.2)

## 2021-12-26 LAB — LACTIC ACID, PLASMA: Lactic Acid, Venous: 2.7 mmol/L (ref 0.5–1.9)

## 2021-12-26 IMAGING — CT CT HEAD W/O CM
3 series · 15 of 47 positions shown, 18 images · non-contrast
Comparison: None Available.

CLINICAL DATA: Altered mental status



[Series 2: head wo · axial · 0.41mm/px · z∈[-134,-9]mm · 9 of 30 slices shown, 12 images]
[im 3/30  brain]
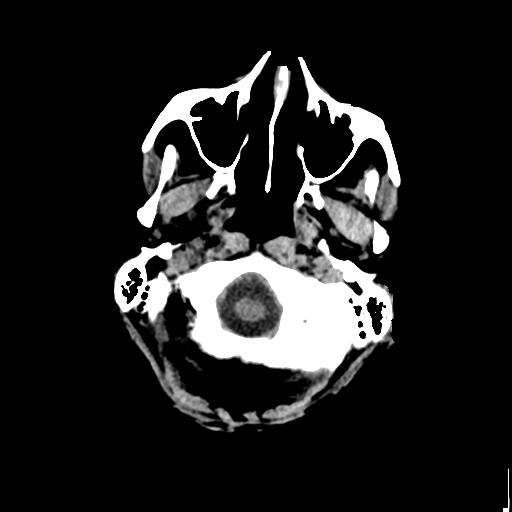
[im 3/30  bone]
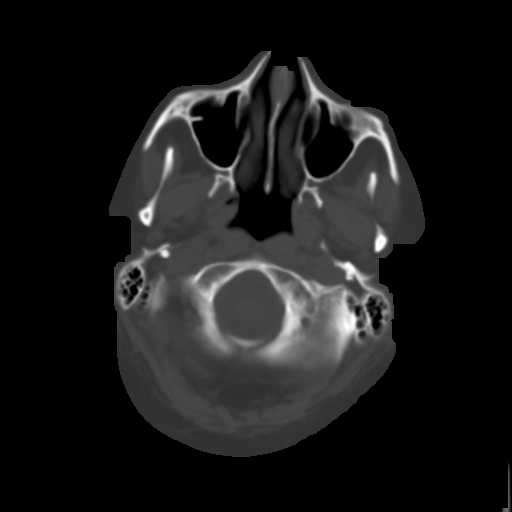
[im 6/30  brain]
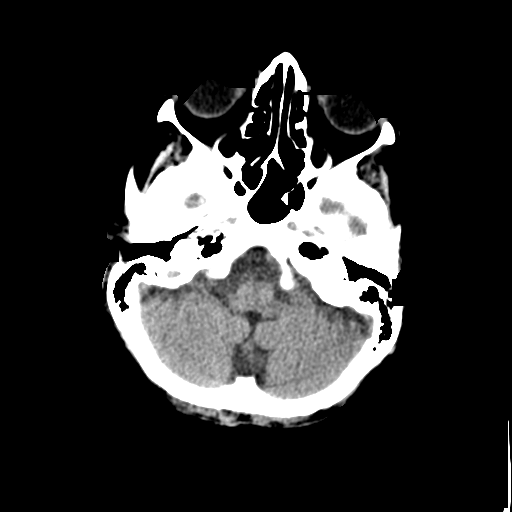
[im 9/30  brain]
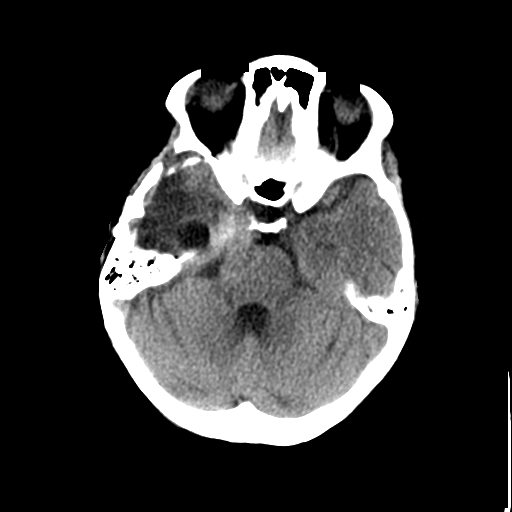
[im 12/30  brain]
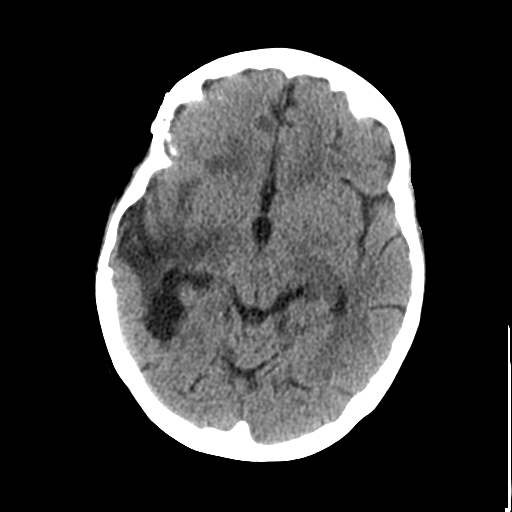
[im 16/30  brain]
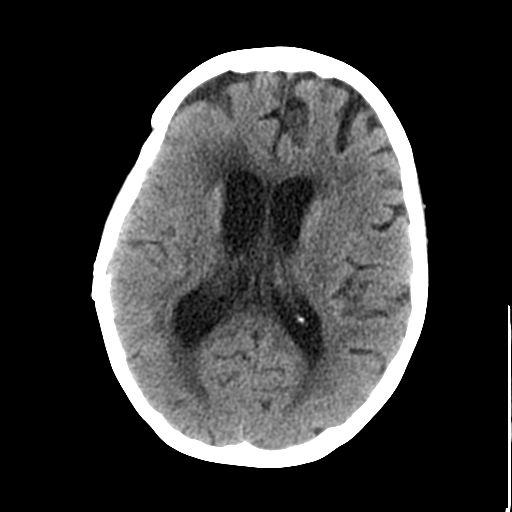
[im 16/30  bone]
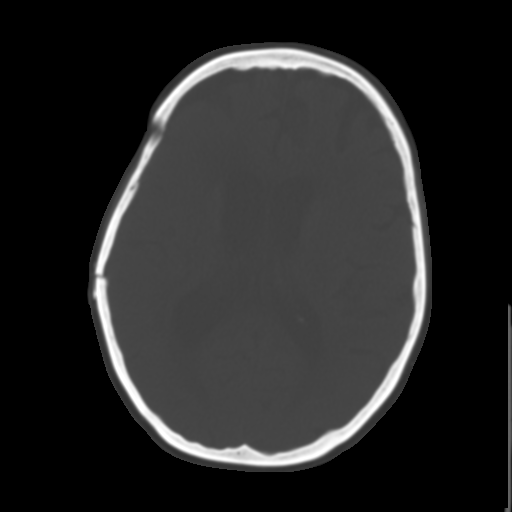
[im 19/30  brain]
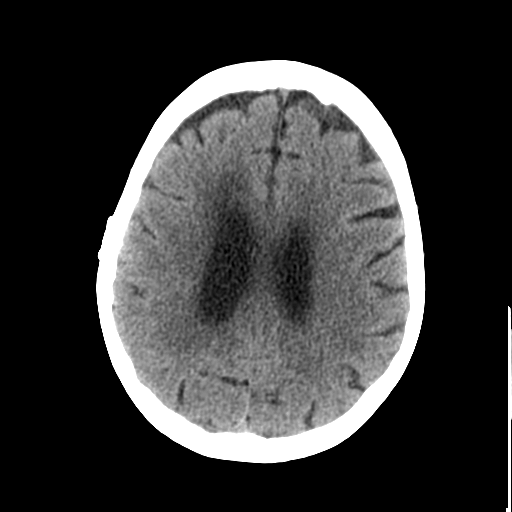
[im 22/30  brain]
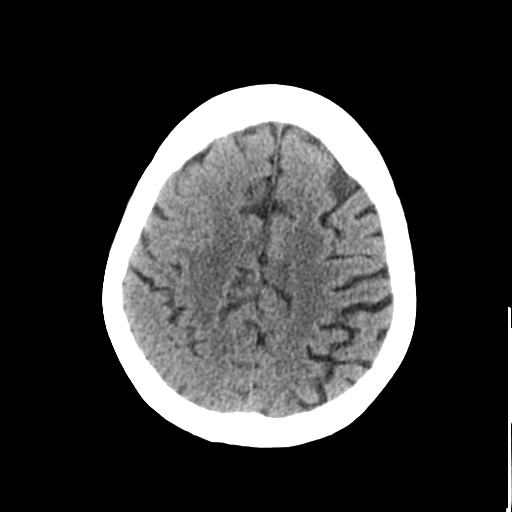
[im 25/30  brain]
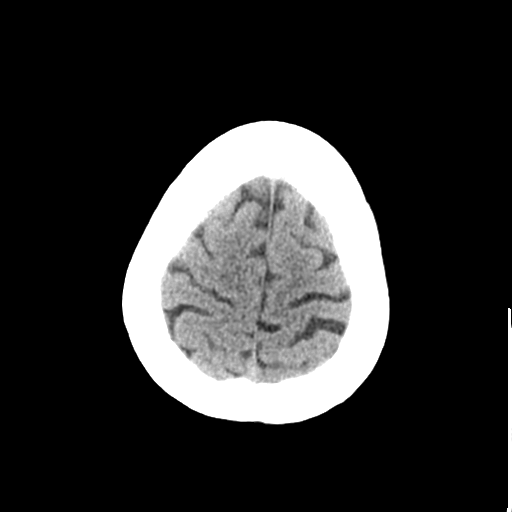
[im 28/30  brain]
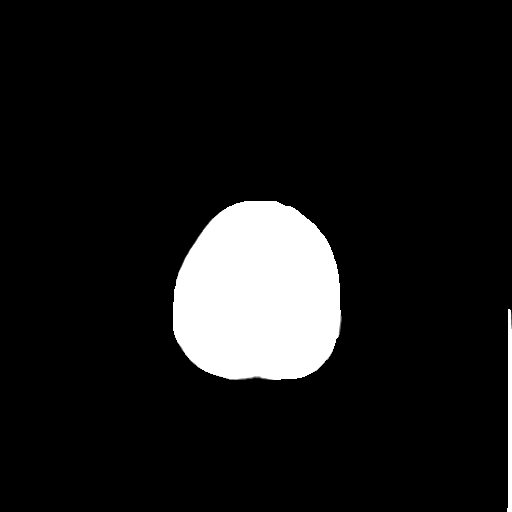
[im 28/30  bone]
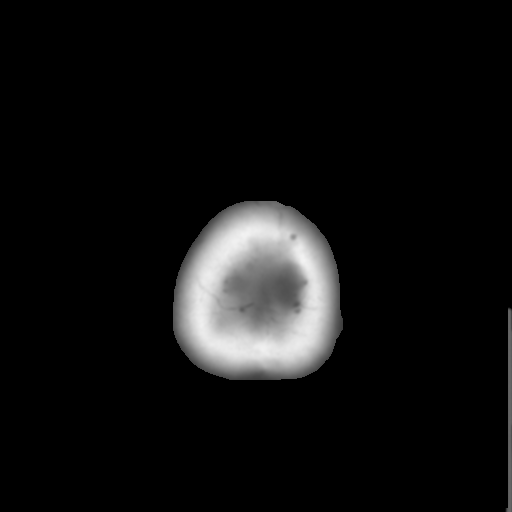

[Series 4: coronal soft tissue · coronal · 0.29mm/px · 3 of 61 slices shown]
[im 21/61  brain]
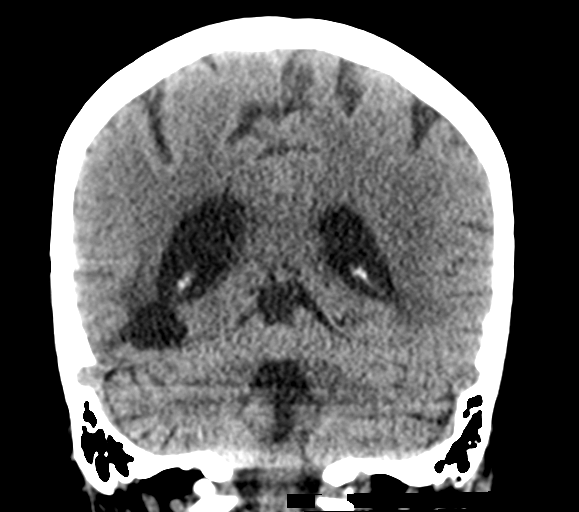
[im 27/61  brain]
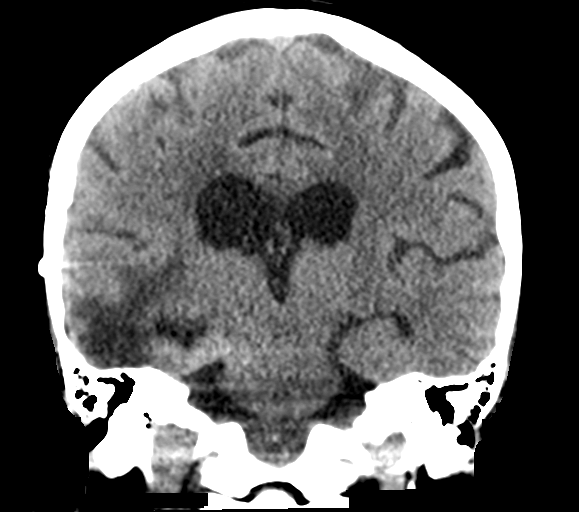
[im 34/61  brain]
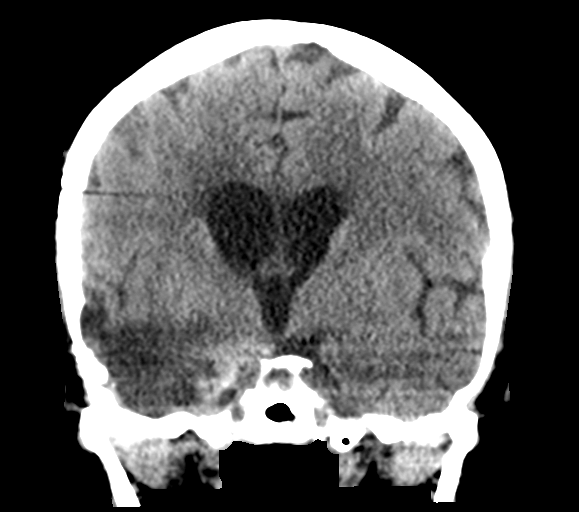

[Series 5: sagittal soft tissue · sagittal · 0.29mm/px · 3 of 53 slices shown]
[im 18/53  brain]
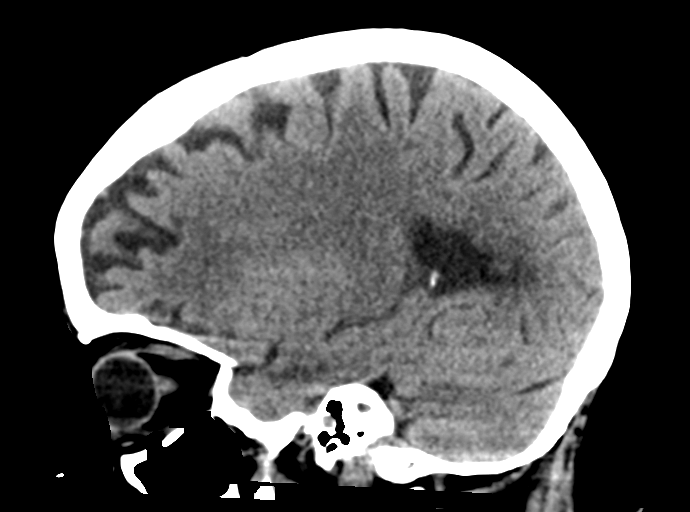
[im 27/53  brain]
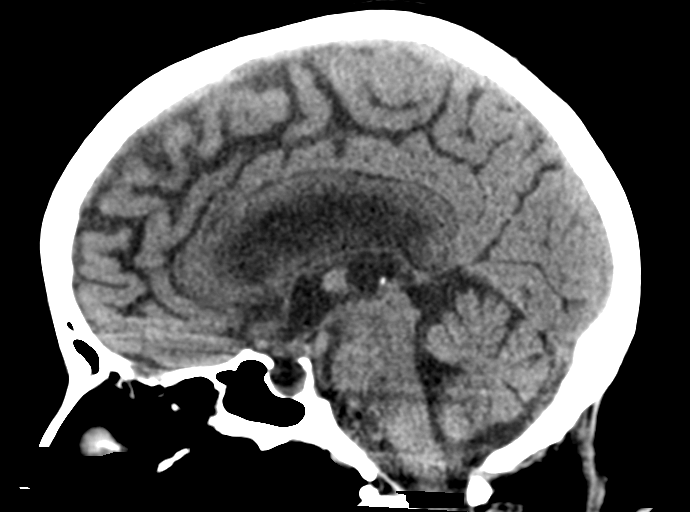
[im 35/53  brain]
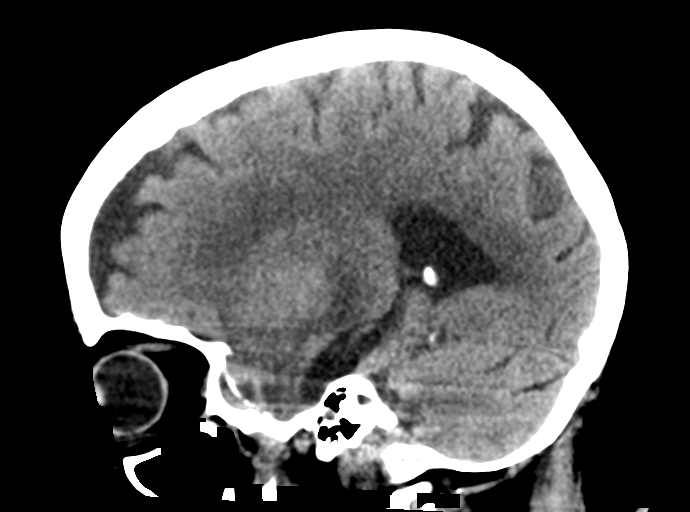

[15 of 47 positions shown; findings below may reference images not displayed]

FINDINGS: Brain: Right temporal lobe encephalomalacia at site of prior tumor
resection. There is hyperdensity near the right cerebral peduncle
corresponding to previously demonstrated area of tumor. No acute
hemorrhage or extra-axial collection. Generalized volume loss with
periventricular white matter changes.

Vascular: No abnormal hyperdensity of the major intracranial
arteries or dural venous sinuses. No intracranial atherosclerosis.

Skull: Remote right craniotomy.

Sinuses/Orbits: No fluid levels or advanced mucosal thickening of
the visualized paranasal sinuses. No mastoid or middle ear effusion.
The orbits are normal.
IMPRESSION: 1. No acute intracranial abnormality.
2. Right temporal lobe encephalomalacia at site of prior tumor
resection.

## 2021-12-26 IMAGING — DX DG CHEST 1V PORT
1 series · 1 of 1 positions shown · non-contrast
Comparison: Chest radiograph dated [DATE] and CT dated
[DATE]

CLINICAL DATA: Generalized weakness.

EXAM:
PORTABLE CHEST 1 VIEW

[chest ap]
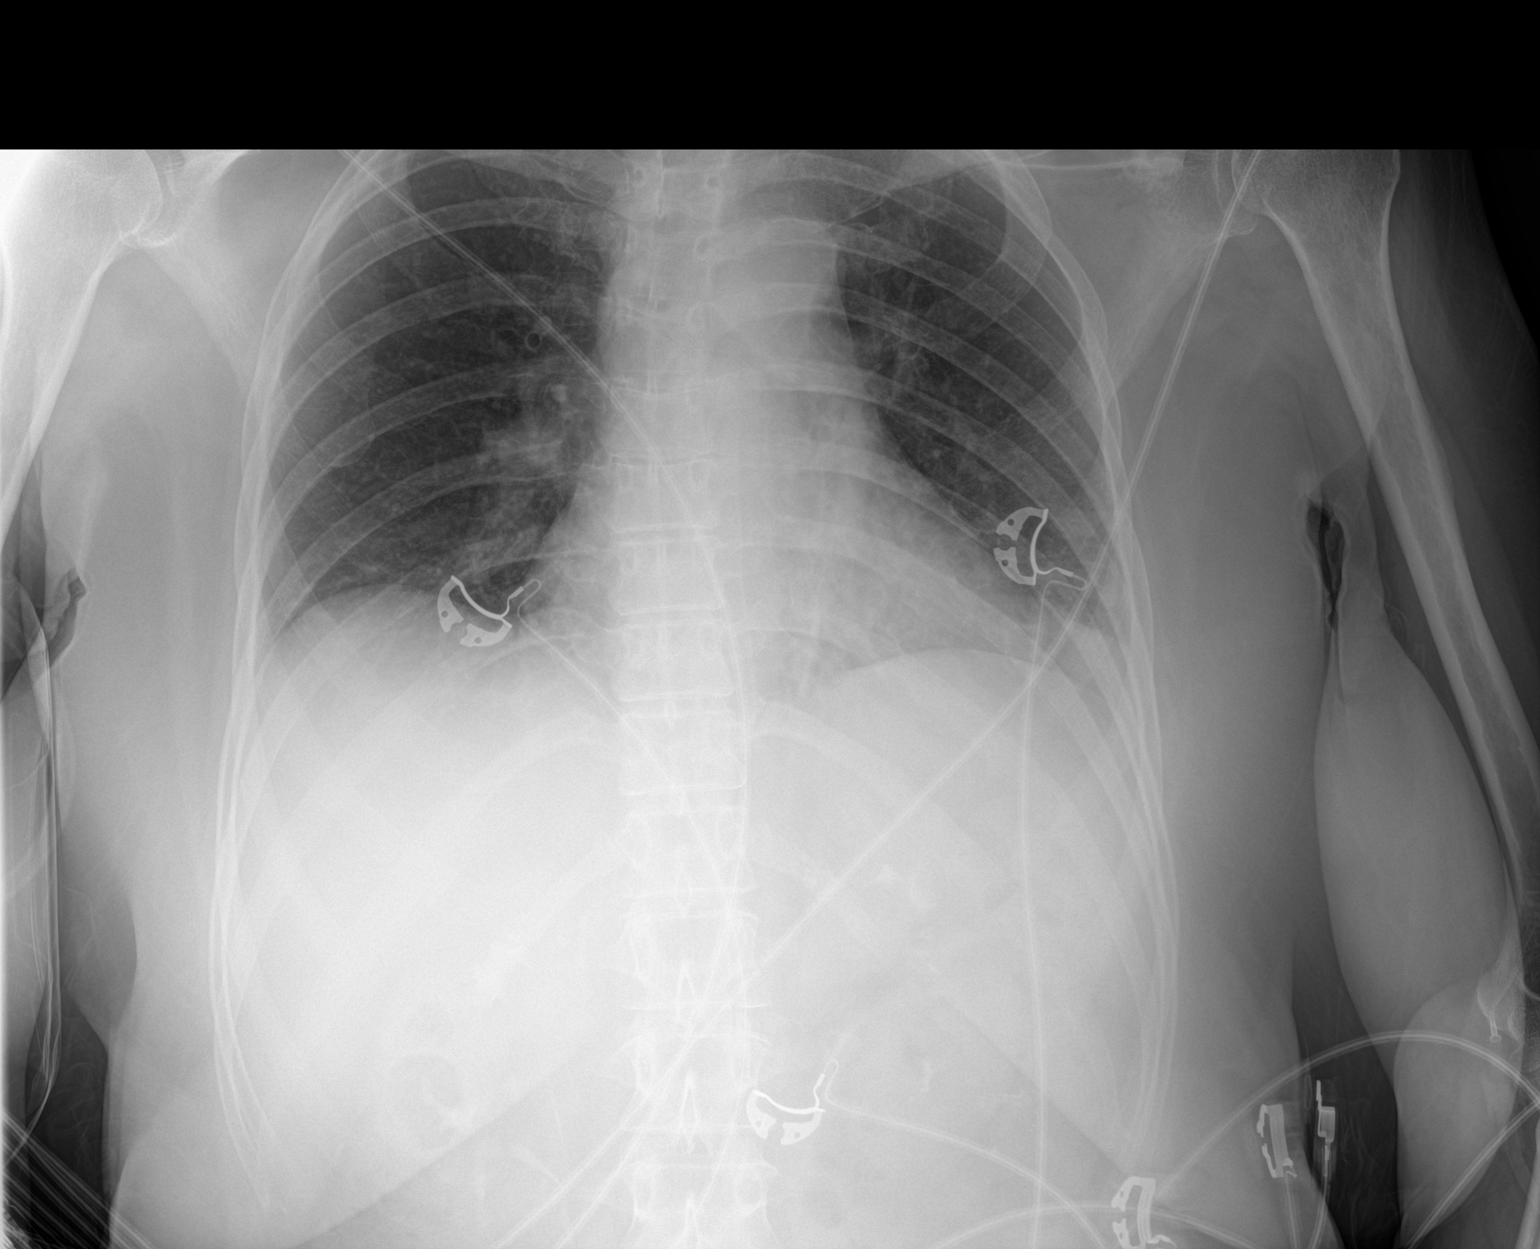

[1 of 1 positions shown; findings below may reference images not displayed]

FINDINGS: Minimal left lung base atelectasis. No focal consolidation, pleural
effusion, pneumothorax. Stable cardiac silhouette. No acute osseous
pathology.
IMPRESSION: No active disease.

## 2021-12-26 MED ORDER — VANCOMYCIN HCL IN DEXTROSE 1-5 GM/200ML-% IV SOLN
1000.0000 mg | Freq: Once | INTRAVENOUS | Status: AC
  Administered 2021-12-27: 1000 mg via INTRAVENOUS
  Filled 2021-12-26: qty 200

## 2021-12-26 MED ORDER — SODIUM CHLORIDE 0.9 % IV BOLUS
1000.0000 mL | Freq: Once | INTRAVENOUS | Status: AC
  Administered 2021-12-26: 1000 mL via INTRAVENOUS

## 2021-12-26 MED ORDER — SODIUM CHLORIDE 0.9% FLUSH
3.0000 mL | Freq: Two times a day (BID) | INTRAVENOUS | Status: DC
  Administered 2021-12-27 – 2022-01-02 (×7): 3 mL via INTRAVENOUS

## 2021-12-26 MED ORDER — FLUCONAZOLE 100 MG PO TABS
100.0000 mg | ORAL_TABLET | Freq: Every day | ORAL | Status: DC
  Administered 2021-12-27 – 2021-12-28 (×2): 100 mg via ORAL
  Filled 2021-12-26 (×2): qty 1

## 2021-12-26 MED ORDER — ACETAMINOPHEN 650 MG RE SUPP: 650.0000 mg | Freq: Four times a day (QID) | RECTAL | Status: DC | PRN

## 2021-12-26 MED ORDER — DEXAMETHASONE 4 MG PO TABS
4.0000 mg | ORAL_TABLET | Freq: Two times a day (BID) | ORAL | Status: DC
  Administered 2021-12-27 – 2022-01-02 (×13): 4 mg via ORAL
  Filled 2021-12-26 (×13): qty 1

## 2021-12-26 MED ORDER — POLYETHYLENE GLYCOL 3350 17 G PO PACK: 17.0000 g | PACK | Freq: Every day | ORAL | Status: DC | PRN

## 2021-12-26 MED ORDER — ONDANSETRON HCL 4 MG PO TABS: 4.0000 mg | ORAL_TABLET | Freq: Four times a day (QID) | ORAL | Status: DC | PRN

## 2021-12-26 MED ORDER — LEVETIRACETAM 500 MG PO TABS
500.0000 mg | ORAL_TABLET | Freq: Two times a day (BID) | ORAL | Status: DC
  Administered 2021-12-27 – 2022-01-02 (×14): 500 mg via ORAL
  Filled 2021-12-26 (×14): qty 1

## 2021-12-26 MED ORDER — LACTATED RINGERS IV BOLUS (SEPSIS)
750.0000 mL | Freq: Once | INTRAVENOUS | Status: AC
  Administered 2021-12-27: 750 mL via INTRAVENOUS

## 2021-12-26 MED ORDER — DICYCLOMINE HCL 10 MG PO CAPS
10.0000 mg | ORAL_CAPSULE | Freq: Two times a day (BID) | ORAL | Status: DC | PRN
  Administered 2021-12-27: 10 mg via ORAL
  Filled 2021-12-26: qty 1

## 2021-12-26 MED ORDER — DILTIAZEM HCL ER COATED BEADS 120 MG PO CP24
120.0000 mg | ORAL_CAPSULE | Freq: Every day | ORAL | Status: DC
  Administered 2021-12-27: 120 mg via ORAL
  Filled 2021-12-26: qty 1

## 2021-12-26 MED ORDER — ACETAMINOPHEN 325 MG PO TABS
650.0000 mg | ORAL_TABLET | Freq: Four times a day (QID) | ORAL | Status: DC | PRN
  Administered 2021-12-27 – 2021-12-30 (×4): 650 mg via ORAL
  Filled 2021-12-26 (×5): qty 2

## 2021-12-26 MED ORDER — IOHEXOL 300 MG/ML  SOLN
100.0000 mL | Freq: Once | INTRAMUSCULAR | Status: AC | PRN
  Administered 2021-12-26: 100 mL via INTRAVENOUS

## 2021-12-26 MED ORDER — LACTATED RINGERS IV SOLN: INTRAVENOUS | Status: AC

## 2021-12-26 MED ORDER — PIPERACILLIN-TAZOBACTAM 3.375 G IVPB 30 MIN
3.3750 g | Freq: Once | INTRAVENOUS | Status: AC
  Administered 2021-12-27: 3.375 g via INTRAVENOUS
  Filled 2021-12-26: qty 50

## 2021-12-26 MED ORDER — ONDANSETRON HCL 4 MG/2ML IJ SOLN
4.0000 mg | Freq: Four times a day (QID) | INTRAMUSCULAR | Status: DC | PRN
  Administered 2021-12-27: 4 mg via INTRAVENOUS
  Filled 2021-12-26: qty 2

## 2021-12-26 NOTE — ED Notes (Signed)
Patient transported to CT 

## 2021-12-26 NOTE — ED Notes (Signed)
Dr. Almyra Free aware of rectal temp. Bair hugger applied. ?

## 2021-12-26 NOTE — H&P (Signed)
?History and Physical  ? ?KEYMONI MCCASTER SVX:[id number] DOB: 10/02/1960 DOA: 12/26/2021 ? ?PCP: Isaac Bliss, Rayford Halsted, MD  ? ?Patient coming from: Home ? ?Chief Complaint: Generalized weakness ? ?HPI: Patricia Stevenson is a 61 y.o. female with medical history significant of active brain cancer, hypertension, hyperlipidemia, GERD, atrial fibrillation presenting with generalized weakness. ? ?History obtained with assistance of chart review, family. Patient has had worsening generalized weakness for the past several days.  She has been bedbound for the past 2 days.  She has had decreased p.o. intake and increased pain.  She is currently undergoing treatment for brain cancer with chemo and radiation.  Noted to be declining at her last visit 4 days ago. ? ?She denies fevers, chills, chest pain, shortness of breath, abdominal pain, constipation, diarrhea, nausea, vomiting. ? ?ED Course: Vital signs in the ED significant for hypothermia to 95.9, heart rate 50s to 60s.  Lab work-up included CMP with sodium stable at 132, BUN 23, glucose 197, calcium 7.9 which improved considering albumin of 2.9, protein 5.8, ALT 80.  CBC with pan cytopenia with hemoglobin 11.6 which is stable, platelets 21 which is near the range for the last 2 weeks, white count of 1.9 which is down from most recent range of 2-4.  Lactic acid elevated 2.7 with repeat pending.  Urinalysis, urine culture, blood cultures pending.  CT head showing no acute changes but right temporal lobe encephalomalacia present at site of prior tumor resection.  CT of the abdomen pelvis showed no acute abnormality did show moderate stool burden.  Patient received a liter of fluids in the ED and pharmacy consult for vancomycin and Zosyn. ? ?Review of Systems: As per HPI otherwise all other systems reviewed and are negative. ? ?Past Medical History:  ?Diagnosis Date  ? GERD (gastroesophageal reflux disease)   ? Hyperlipidemia   ? Vitamin D deficiency   ? ? ?Past Surgical History:   ?Procedure Laterality Date  ? APPLICATION OF CRANIAL NAVIGATION Right 09/15/2021  ? Procedure: APPLICATION OF CRANIAL NAVIGATION;  Surgeon: Vallarie Mare, MD;  Location: [facility];  Service: Neurosurgery;  Laterality: Right;  ? BREAST BIOPSY Left   ? 20 years ago in [place]- benign- not sure if bx or cyst  ? CHOLECYSTECTOMY    ? Laparoscopic cholecystectomy 04/29/2021  ? CRANIOTOMY Right 09/15/2021  ? Procedure: Right Craniotomy Temporal Lobectomy with Brain Lab;  Surgeon: Vallarie Mare, MD;  Location: [facility];  Service: Neurosurgery;  Laterality: Right;  ? ? ?Social History ? reports that she has never smoked. She has never used smokeless tobacco. She reports that she does not drink alcohol and does not use drugs. ? ?Allergies  ?Allergen Reactions  ? Latex   ? ? ?Family History  ?Problem Relation Age of Onset  ? Breast cancer Neg Hx   ? CAD Neg Hx   ?Reviewed on admission ? ?Prior to Admission medications   ?Medication Sig Start Date End Date Taking? Authorizing Provider  ?acetaminophen (TYLENOL) 500 MG tablet Take 1,000 mg by mouth every 6 (six) hours as needed for moderate pain or headache.    [provider]  ?Ascorbic Acid (VITA-C PO) Take 500 mg by mouth daily. Unsure of dose    [provider]  ?calcium carbonate (OSCAL) 1500 (600 Ca) MG TABS tablet Take 1,500 mg by mouth 2 (two) times daily with a meal.    [provider]  ?dexamethasone (DECADRON) 4 MG tablet Take 1 tablet (4 mg total)  by mouth 2 (two) times daily with a meal. 12/14/21   Vaslow, Acey Lav, MD  ?dicyclomine (BENTYL) 10 MG capsule Take 1 capsule (10 mg total) by mouth in the morning and at bedtime. As needed ?Patient taking differently: Take 10 mg by mouth 2 (two) times daily as needed for spasms. 09/03/21   Mauri Pole, MD  ?diltiazem (CARDIZEM CD) 120 MG 24 hr capsule Take 1 capsule (120 mg total) by mouth daily. 09/20/21   Vallarie Mare, MD  ?famotidine (PEPCID) 20 MG tablet Take 1 tablet (20 mg  total) by mouth 2 (two) times daily. 09/03/21   Mauri Pole, MD  ?fluconazole (DIFLUCAN) 100 MG tablet Take 1 tablet (100 mg total) by mouth daily. Take 2 tablets on day 1, then 1 tablet daily 12/16/21   Barrie Folk, PA-C  ?levETIRAcetam (KEPPRA) 500 MG tablet Take 1 tablet (500 mg total) by mouth 2 (two) times daily. 09/19/21   Vallarie Mare, MD  ?ondansetron (ZOFRAN) 8 MG tablet Take 1 tablet (8 mg total) by mouth 2 (two) times daily as needed (nausea and vomiting). May take 30-60 minutes prior to Temodar administration if nausea/vomiting occurs. 10/29/21   Ventura Sellers, MD  ?VITAMIN D PO Take 1,000 Units by mouth daily.    [provider]  ? ? ?Physical Exam: ?Vitals:  ? 12/26/21 2215 12/26/21 2230 12/26/21 2300 12/26/21 2330  ?BP: 131/63 128/71  98/62  ?Pulse: (!) 50 (!) 56 63 60  ?Resp: '11 12 15 15  '$ ?Temp:      ?TempSrc:      ?SpO2: 97% 99% 96% 96%  ? ? ?Physical Exam ?Constitutional:   ?   General: She is not in acute distress. ?   Appearance: Normal appearance.  ?HENT:  ?   Head: Normocephalic and atraumatic.  ?   Mouth/Throat:  ?   Mouth: Mucous membranes are moist.  ?   Pharynx: Oropharynx is clear.  ?Eyes:  ?   Extraocular Movements: Extraocular movements intact.  ?   Pupils: Pupils are equal, round, and reactive to light.  ?Cardiovascular:  ?   Rate and Rhythm: Normal rate and regular rhythm.  ?   Pulses: Normal pulses.  ?   Heart sounds: Normal heart sounds.  ?Pulmonary:  ?   Effort: Pulmonary effort is normal. No respiratory distress.  ?   Breath sounds: Normal breath sounds.  ?Abdominal:  ?   General: Bowel sounds are normal. There is no distension.  ?   Palpations: Abdomen is soft.  ?   Tenderness: There is no abdominal tenderness.  ?Musculoskeletal:     ?   General: No swelling or deformity.  ?Skin: ?   General: Skin is warm and dry.  ?Neurological:  ?   General: No focal deficit present.  ?   Mental Status: Mental status is at baseline.  ?   Motor: Weakness  (Generalized) present.  ? ?Labs on Admission: I have personally reviewed following labs and imaging studies ? ?CBC: ?Recent Labs  ?Lab 12/22/21 ?1509 12/26/21 ?2058  ?WBC 2.5* 1.9*  ?NEUTROABS 2.2 1.7  ?HGB 12.9 11.6*  ?HCT 36.2 32.6*  ?MCV 89.8 91.6  ?PLT 23* 20*  ? ? ?Basic Metabolic Panel: ?Recent Labs  ?Lab 12/22/21 ?1509 12/26/21 ?2058  ?NA 131* 132*  ?K 4.8 4.2  ?CL 98 99  ?CO2 27 26  ?GLUCOSE 124* 197*  ?BUN 24* 23*  ?CREATININE 0.49 0.41*  ?CALCIUM 9.3 7.9*  ? ? ?GFR: ?  Estimated Creatinine Clearance: 55.1 mL/min (A) (by C-G formula based on SCr of 0.41 mg/dL (L)). ? ?Liver Function Tests: ?Recent Labs  ?Lab 12/22/21 ?1509 12/26/21 ?2058  ?AST 18 25  ?ALT 73* 80*  ?ALKPHOS 57 53  ?BILITOT 0.4 0.6  ?PROT 6.2* 5.2*  ?ALBUMIN 3.8 2.9*  ? ? ?Urine analysis: ?No results found for: COLORURINE, APPEARANCEUR, [place], [place], [place], [place], [place], KETONESUR, PROTEINUR, [place], NITRITE, LEUKOCYTESUR ? ?Radiological Exams on Admission: ?CT Head Wo Contrast ? ?Result Date: 12/26/2021 ?CLINICAL DATA:  Altered mental status EXAM: CT HEAD WITHOUT CONTRAST TECHNIQUE: Contiguous axial images were obtained from the base of the skull through the vertex without intravenous contrast. RADIATION DOSE REDUCTION: This exam was performed according to the departmental dose-optimization program which includes automated exposure control, adjustment of the mA and/or kV according to patient size and/or use of iterative reconstruction technique. COMPARISON:  None Available. FINDINGS: Brain: Right temporal lobe encephalomalacia at site of prior tumor resection. There is hyperdensity near the right cerebral peduncle corresponding to previously demonstrated area of tumor. No acute hemorrhage or extra-axial collection. Generalized volume loss with periventricular white matter changes. Vascular: No abnormal hyperdensity of the major intracranial arteries or dural venous sinuses. No intracranial atherosclerosis. Skull: Remote right  craniotomy. Sinuses/Orbits: No fluid levels or advanced mucosal thickening of the visualized paranasal sinuses. No mastoid or middle ear effusion. The orbits are normal. IMPRESSION: 1. No acute intracranial abnorm

## 2021-12-26 NOTE — ED Provider Notes (Signed)
?[facility] DEPT ?Provider Note ? ? ?CSN: [id number] ?Arrival date & time: 12/26/21  2041 ? ?  ? ?History ? ?Chief Complaint  ?Patient presents with  ? Weakness  ? ? ?Patricia Stevenson is a 61 y.o. female. ? 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  States that she is in bed bound for the past 2 days.  No reported fevers reported cough no reported vomiting or diarrhea.  She did complain of some lower abdominal pain for the past 2 days. ? ? ?  ? ?Home Medications ?Prior to Admission medications   ?Medication Sig Start Date End Date Taking? Authorizing Provider  ?acetaminophen (TYLENOL) 500 MG tablet Take 1,000 mg by mouth every 6 (six) hours as needed for moderate pain or headache.    [provider]  ?Ascorbic Acid (VITA-C PO) Take 500 mg by mouth daily. Unsure of dose    [provider]  ?calcium carbonate (OSCAL) 1500 (600 Ca) MG TABS tablet Take 1,500 mg by mouth 2 (two) times daily with a meal.    [provider]  ?dexamethasone (DECADRON) 4 MG tablet Take 1 tablet (4 mg total) by mouth 2 (two) times daily with a meal. 12/14/21   Vaslow, Acey Lav, MD  ?dicyclomine (BENTYL) 10 MG capsule Take 1 capsule (10 mg total) by mouth in the morning and at bedtime. As needed ?Patient taking differently: Take 10 mg by mouth 2 (two) times daily as needed for spasms. 09/03/21   Mauri Pole, MD  ?diltiazem (CARDIZEM CD) 120 MG 24 hr capsule Take 1 capsule (120 mg total) by mouth daily. 09/20/21   Vallarie Mare, MD  ?famotidine (PEPCID) 20 MG tablet Take 1 tablet (20 mg total) by mouth 2 (two) times daily. 09/03/21   Mauri Pole, MD  ?fluconazole (DIFLUCAN) 100 MG tablet Take 1 tablet (100 mg total) by mouth daily. Take 2 tablets on day 1, then 1 tablet daily 12/16/21   Barrie Folk, PA-C  ?levETIRAcetam  (KEPPRA) 500 MG tablet Take 1 tablet (500 mg total) by mouth 2 (two) times daily. 09/19/21   Vallarie Mare, MD  ?ondansetron (ZOFRAN) 8 MG tablet Take 1 tablet (8 mg total) by mouth 2 (two) times daily as needed (nausea and vomiting). May take 30-60 minutes prior to Temodar administration if nausea/vomiting occurs. 10/29/21   Ventura Sellers, MD  ?VITAMIN D PO Take 1,000 Units by mouth daily.    [provider]  ?   ? ?Allergies    ?Latex   ? ?Review of Systems   ?Review of Systems  ?Constitutional:  Negative for fever.  ?HENT:  Negative for ear pain.   ?Eyes:  Negative for pain.  ?Respiratory:  Negative for cough.   ?Cardiovascular:  Negative for chest pain.  ?Gastrointestinal:  Positive for abdominal pain.  ?Genitourinary:  Negative for flank pain.  ?Musculoskeletal:  Negative for back pain.  ?Skin:  Negative for rash.  ?Neurological:  Negative for headaches.  ? ?Physical Exam ?Updated Vital Signs ?BP 98/62   Pulse 60   Temp (!) 95.9 ?F (35.5 ?C) (Rectal)   Resp 15   SpO2 96%  ?Physical Exam ?Constitutional:   ?   General: She is not in acute distress. ?   Appearance: Normal appearance.  ?HENT:  ?   Head: Normocephalic.  ?   Nose: Nose normal.  ?  Eyes:  ?   Extraocular Movements: Extraocular movements intact.  ?Cardiovascular:  ?   Rate and Rhythm: Normal rate.  ?Pulmonary:  ?   Effort: Pulmonary effort is normal.  ?Abdominal:  ?   Comments: No guarding or rebound but mild tenderness in the lower abdominal mid region.  ?Musculoskeletal:     ?   General: Normal range of motion.  ?   Cervical back: Normal range of motion.  ?Neurological:  ?   Mental Status: She is alert and oriented to person, place, and time. Mental status is at baseline.  ?   Comments: Mild weakness in the left lower extremity which is her baseline per family.  Bilateral upper extremity strength is 5/5 strength.  ? ? ?ED Results / Procedures / Treatments   ?Labs ?(all labs ordered are listed, but only abnormal results are  displayed) ?Labs Reviewed  ?CBC WITH DIFFERENTIAL/PLATELET - Abnormal; Notable for the following components:  ?    Result Value  ? WBC 1.9 (*)   ? RBC 3.56 (*)   ? Hemoglobin 11.6 (*)   ? HCT 32.6 (*)   ? RDW 15.6 (*)   ? Platelets 20 (*)   ? Lymphs Abs 0.1 (*)   ? All other components within normal limits  ?COMPREHENSIVE METABOLIC PANEL - Abnormal; Notable for the following components:  ? Sodium 132 (*)   ? Glucose, Bld 197 (*)   ? BUN 23 (*)   ? Creatinine, Ser 0.41 (*)   ? Calcium 7.9 (*)   ? Total Protein 5.2 (*)   ? Albumin 2.9 (*)   ? ALT 80 (*)   ? All other components within normal limits  ?LACTIC ACID, PLASMA - Abnormal; Notable for the following components:  ? Lactic Acid, Venous 2.7 (*)   ? All other components within normal limits  ?CULTURE, BLOOD (ROUTINE X 2)  ?CULTURE, BLOOD (ROUTINE X 2)  ?URINE CULTURE  ?LACTIC ACID, PLASMA  ?URINALYSIS, ROUTINE W REFLEX MICROSCOPIC  ? ? ?EKG ?None ? ?Radiology ?CT Head Wo Contrast ? ?Result Date: 12/26/2021 ?CLINICAL DATA:  Altered mental status EXAM: CT HEAD WITHOUT CONTRAST TECHNIQUE: Contiguous axial images were obtained from the base of the skull through the vertex without intravenous contrast. RADIATION DOSE REDUCTION: This exam was performed according to the departmental dose-optimization program which includes automated exposure control, adjustment of the mA and/or kV according to patient size and/or use of iterative reconstruction technique. COMPARISON:  None Available. FINDINGS: Brain: Right temporal lobe encephalomalacia at site of prior tumor resection. There is hyperdensity near the right cerebral peduncle corresponding to previously demonstrated area of tumor. No acute hemorrhage or extra-axial collection. Generalized volume loss with periventricular white matter changes. Vascular: No abnormal hyperdensity of the major intracranial arteries or dural venous sinuses. No intracranial atherosclerosis. Skull: Remote right craniotomy. Sinuses/Orbits: No fluid  levels or advanced mucosal thickening of the visualized paranasal sinuses. No mastoid or middle ear effusion. The orbits are normal. IMPRESSION: 1. No acute intracranial abnormality. 2. Right temporal lobe encephalomalacia at site of prior tumor resection. Electronically Signed   By: Ulyses Jarred M.D.   On: 12/26/2021 23:07  ? ?CT Abdomen Pelvis W Contrast ? ?Result Date: 12/26/2021 ?CLINICAL DATA:  Abdominal pain. EXAM: CT ABDOMEN AND PELVIS WITH CONTRAST TECHNIQUE: Multidetector CT imaging of the abdomen and pelvis was performed using the standard protocol following bolus administration of intravenous contrast. RADIATION DOSE REDUCTION: This exam was performed according to the departmental dose-optimization program which includes automated  exposure control, adjustment of the mA and/or kV according to patient size and/or use of iterative reconstruction technique. CONTRAST:  166m OMNIPAQUE IOHEXOL 300 MG/ML  SOLN COMPARISON:  CT abdomen pelvis dated 06/10/2021. FINDINGS: Lower chest: Bibasilar linear atelectasis. No intra-abdominal free air or free fluid. Hepatobiliary: The liver is unremarkable. No intrahepatic biliary dilatation. Cholecystectomy. Pancreas: Unremarkable. No pancreatic ductal dilatation or surrounding inflammatory changes. Spleen: Normal in size without focal abnormality. Adrenals/Urinary Tract: The adrenal glands unremarkable the kidneys, visualized ureters, and urinary bladder appear unremarkable. Stomach/Bowel: There is moderate stool throughout the colon. There is no bowel obstruction or active inflammation. The appendix is normal. Vascular/Lymphatic: The abdominal aorta and IVC are unremarkable. No portal venous gas. There is no adenopathy. Reproductive: The uterus and ovaries are grossly unremarkable. No pelvic mass. Other: None Musculoskeletal: No acute or significant osseous findings. IMPRESSION: 1. No acute intra-abdominal or pelvic pathology. 2. Moderate colonic stool burden. No bowel  obstruction. Normal appendix. Electronically Signed   By: AAnner CreteM.D.   On: 12/26/2021 23:22   ? ?Procedures ?.Marland Kitchenritical Care ?Performed by: HLuna Fuse MD ?Authorized by: HLuna Fuse MD  ? ?Critical c

## 2021-12-26 NOTE — Progress Notes (Signed)
A consult was received from an ED physician for vancomycin and zosyn per pharmacy dosing.  The patient's profile has been reviewed for ht/wt/allergies/indication/available labs.   ?A one time order has been placed for vanc 1gm and zosyn 3.375gm.  ? ? Further antibiotics/pharmacy consults should be ordered by admitting physician if indicated.       ?                ?Thank you, ?Dolly Rias RPh ?12/26/2021, 11:54 PM ? ?

## 2021-12-26 NOTE — ED Triage Notes (Signed)
Per EMS, patient from home, currently being treated for brain cancer with chemo and radiation. Bed bound x2 days with generalized weakness, decreased PO intake, and pain all over. Had Tylenol at 1330. A&Ox4. Initial  ? ?CBG 60-7g D10 with EMS. Most recent CBG 90 ? ?20g L AC ?23mg Fentanyl ?'4mg'$  Zofran ?3570mNS ? ?HR 60 ?BP 130/70 ?96% RA ? ?

## 2021-12-26 NOTE — ED Notes (Signed)
Sister at bedside. MD aware of BP. ?

## 2021-12-27 ENCOUNTER — Other Ambulatory Visit: Payer: Self-pay

## 2021-12-27 ENCOUNTER — Encounter (HOSPITAL_COMMUNITY): Payer: Self-pay | Admitting: Internal Medicine

## 2021-12-27 ENCOUNTER — Inpatient Hospital Stay (HOSPITAL_COMMUNITY): Payer: 59

## 2021-12-27 DIAGNOSIS — R6510 Systemic inflammatory response syndrome (SIRS) of non-infectious origin without acute organ dysfunction: Secondary | ICD-10-CM | POA: Diagnosis not present

## 2021-12-27 LAB — PROTIME-INR
INR: 1 (ref 0.8–1.2)
Prothrombin Time: 13.1 seconds (ref 11.4–15.2)

## 2021-12-27 LAB — DIC (DISSEMINATED INTRAVASCULAR COAGULATION)PANEL
D-Dimer, Quant: 0.7 ug/mL-FEU — ABNORMAL HIGH (ref 0.00–0.50)
Fibrinogen: 194 mg/dL — ABNORMAL LOW (ref 210–475)
INR: 1 (ref 0.8–1.2)
Platelets: 60 10*3/uL — ABNORMAL LOW (ref 150–400)
Prothrombin Time: 13.3 seconds (ref 11.4–15.2)
Smear Review: NONE SEEN
aPTT: <20 seconds — ABNORMAL LOW (ref 24–36)

## 2021-12-27 LAB — DIFFERENTIAL
Abs Immature Granulocytes: 0.02 10*3/uL (ref 0.00–0.07)
Basophils Absolute: 0 10*3/uL (ref 0.0–0.1)
Basophils Relative: 0 %
Eosinophils Absolute: 0 10*3/uL (ref 0.0–0.5)
Eosinophils Relative: 0 %
Immature Granulocytes: 1 %
Lymphocytes Relative: 5 %
Lymphs Abs: 0.1 10*3/uL — ABNORMAL LOW (ref 0.7–4.0)
Monocytes Absolute: 0.1 10*3/uL (ref 0.1–1.0)
Monocytes Relative: 5 %
Neutro Abs: 1.6 10*3/uL — ABNORMAL LOW (ref 1.7–7.7)
Neutrophils Relative %: 89 %

## 2021-12-27 LAB — TECHNOLOGIST SMEAR REVIEW
Plt Morphology: NORMAL
RBC MORPHOLOGY: NONE SEEN

## 2021-12-27 LAB — COMPREHENSIVE METABOLIC PANEL
ALT: 69 U/L — ABNORMAL HIGH (ref 0–44)
AST: 21 U/L (ref 15–41)
Albumin: 2.6 g/dL — ABNORMAL LOW (ref 3.5–5.0)
Alkaline Phosphatase: 41 U/L (ref 38–126)
Anion gap: 6 (ref 5–15)
BUN: 15 mg/dL (ref 6–20)
CO2: 24 mmol/L (ref 22–32)
Calcium: 8.1 mg/dL — ABNORMAL LOW (ref 8.9–10.3)
Chloride: 104 mmol/L (ref 98–111)
Creatinine, Ser: 0.34 mg/dL — ABNORMAL LOW (ref 0.44–1.00)
GFR, Estimated: >60 mL/min (ref >60)
Glucose, Bld: 135 mg/dL — ABNORMAL HIGH (ref 70–99)
Potassium: 3.8 mmol/L (ref 3.5–5.1)
Sodium: 134 mmol/L — ABNORMAL LOW (ref 135–145)
Total Bilirubin: 0.5 mg/dL (ref 0.3–1.2)
Total Protein: 4.7 g/dL — ABNORMAL LOW (ref 6.5–8.1)

## 2021-12-27 LAB — CORTISOL-AM, BLOOD: Cortisol - AM: 2.1 ug/dL — ABNORMAL LOW (ref 6.7–22.6)

## 2021-12-27 LAB — URINALYSIS, ROUTINE W REFLEX MICROSCOPIC
Bilirubin Urine: NEGATIVE
Glucose, UA: 50 mg/dL — AB
Hgb urine dipstick: NEGATIVE
Ketones, ur: NEGATIVE mg/dL
Leukocytes,Ua: NEGATIVE
Nitrite: NEGATIVE
Protein, ur: NEGATIVE mg/dL
Specific Gravity, Urine: 1.026 (ref 1.005–1.030)
pH: 8 (ref 5.0–8.0)

## 2021-12-27 LAB — RETIC PANEL
Immature Retic Fract: 10.9 % (ref 2.3–15.9)
RBC.: 3.18 MIL/uL — ABNORMAL LOW (ref 3.87–5.11)
Retic Count, Absolute: 35.9 10*3/uL (ref 19.0–186.0)
Retic Ct Pct: 1.1 % (ref 0.4–3.1)
Reticulocyte Hemoglobin: 43.2 pg (ref >27.9)

## 2021-12-27 LAB — LACTATE DEHYDROGENASE: LDH: 207 U/L — ABNORMAL HIGH (ref 98–192)

## 2021-12-27 LAB — LACTIC ACID, PLASMA: Lactic Acid, Venous: 2.4 mmol/L (ref 0.5–1.9)

## 2021-12-27 LAB — TYPE AND SCREEN
ABO/RH(D): B POS
Antibody Screen: NEGATIVE

## 2021-12-27 LAB — MRSA NEXT GEN BY PCR, NASAL: MRSA by PCR Next Gen: NOT DETECTED

## 2021-12-27 LAB — IMMATURE PLATELET FRACTION: Immature Platelet Fraction: 1.5 % (ref 1.2–8.6)

## 2021-12-27 LAB — PROCALCITONIN: Procalcitonin: <0.1 ng/mL

## 2021-12-27 IMAGING — MR MR HEAD WO/W CM
15 series · 48 of 48 positions shown · IV contrast (gadavist)
Comparison: Head CT yesterday. Restaging brain MRI [DATE] and
earlier.

CLINICAL DATA: 60-year-old female with high-grade glioma status
post subtotal resection. Altered mental status.

EXAM:
MRI HEAD WITHOUT AND WITH CONTRAST
TECHNIQUE: Multiplanar, multiecho pulse sequences of the brain and surrounding
structures were obtained without and with intravenous contrast.
CONTRAST:  5mL GADAVIST GADOBUTROL 1 MMOL/ML IV SOLN

[Series 5: DWI · axial · 3.0mm · 1.36mm/px · z∈[-84,+53]mm · 6 of 96 slices shown (1 of 2)]
[im 1/96]
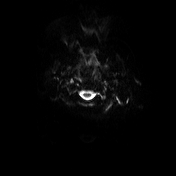
[im 20/96]
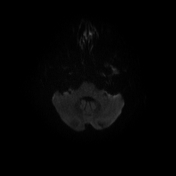
[im 39/96]
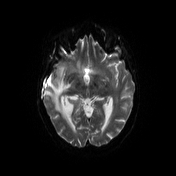
[im 58/96]
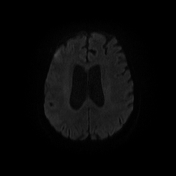
[im 77/96]
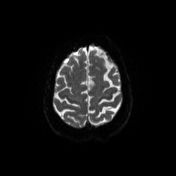
[im 96/96]
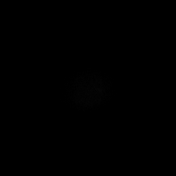

[Series 6: DWI · axial · 3.0mm · 1.36mm/px · z∈[-84,+53]mm · 3 of 48 slices shown (2 of 2)]
[im 1/48]
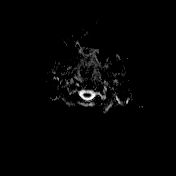
[im 24/48]
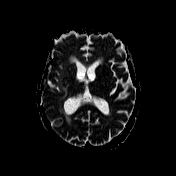
[im 48/48]
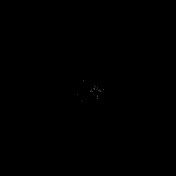

[Series 7: T1 · sagittal · 5.0mm · 0.75mm/px · 1 of 24 slices shown (1 of 4)]
[im 1/24]
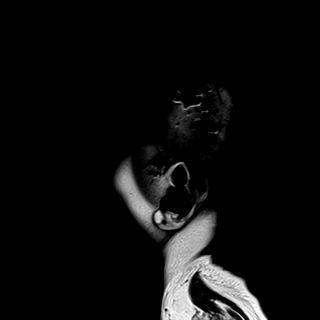

[Series 8: T2 · axial · 5.0mm · 0.62mm/px · 1 of 22 slices shown]
[im 1/22]
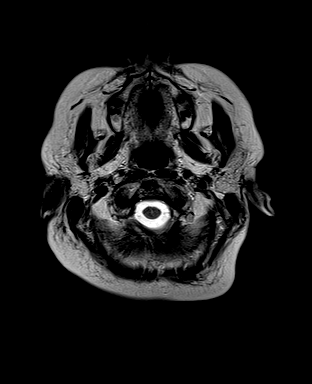

[Series 9: swi_images · axial · 3.0mm · 0.75mm/px · z∈[-115,+91]mm · 4 of 72 slices shown]
[im 1/72]
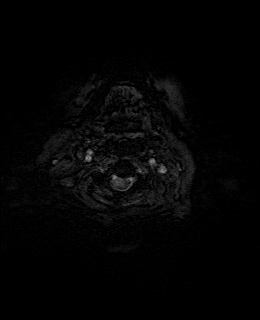
[im 24/72]
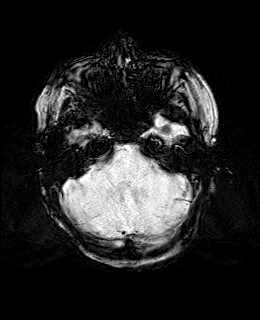
[im 48/72]
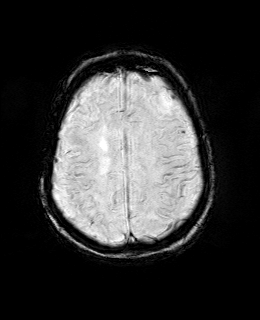
[im 72/72]
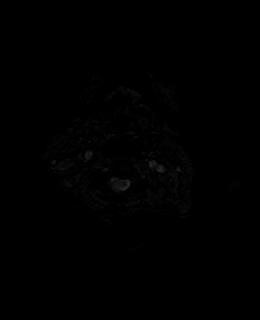

[Series 11: FLAIR · axial · 3.0mm · 0.75mm/px · z∈[-86,+62]mm · 3 of 52 slices shown]
[im 1/52]
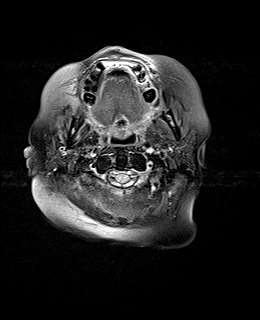
[im 26/52]
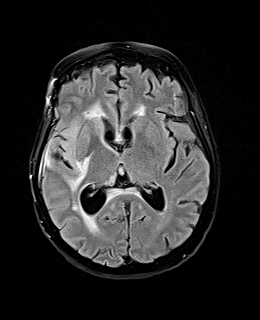
[im 52/52]
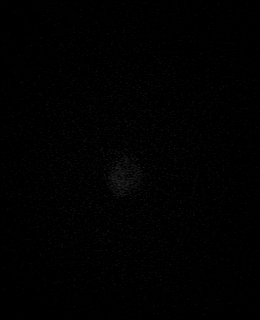

[Series 12: T1 · axial · 1.0mm · 0.94mm/px · z∈[-81,+58]mm · 8 of 144 slices shown (2 of 4)]
[im 1/144]
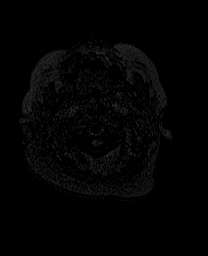
[im 21/144]
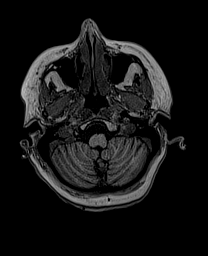
[im 41/144]
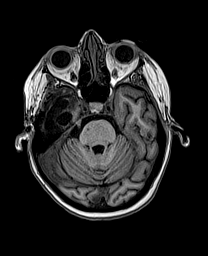
[im 62/144]
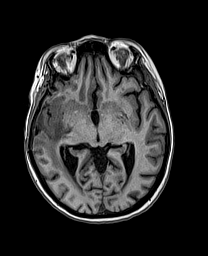
[im 82/144]
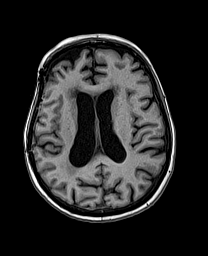
[im 103/144]
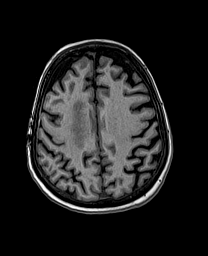
[im 123/144]
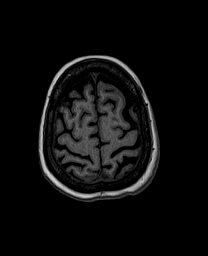
[im 144/144]
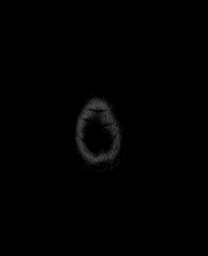

[Series 13: cor dwi_tracew · coronal · 5.0mm · 1.53mm/px · 3 of 56 slices shown]
[im 1/56]
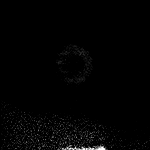
[im 28/56]
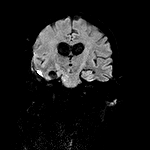
[im 56/56]
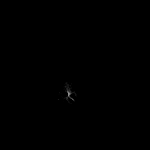

[Series 14: cor dwi_adc · coronal · 5.0mm · 1.53mm/px · 2 of 27 slices shown]
[im 1/27]
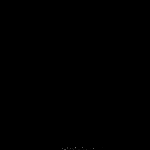
[im 27/27]
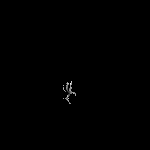

[Series 15: T2 post-contrast · coronal · 5.0mm · 0.57mm/px · 2 of 28 slices shown]
[im 1/28]
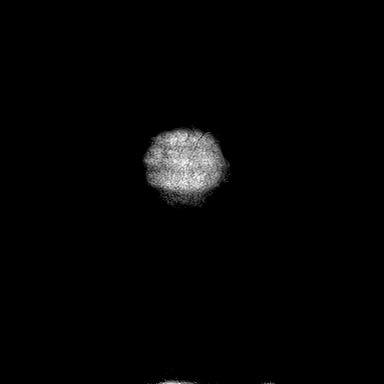
[im 28/28]
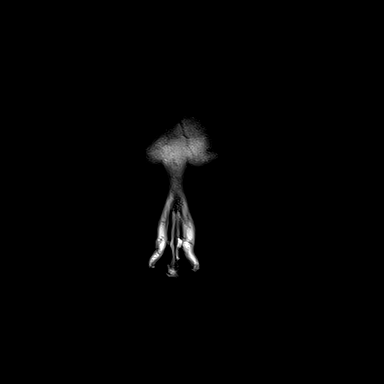

[Series 16: T1 post-contrast · axial · 1.0mm · 0.94mm/px · z∈[-81,+58]mm · 8 of 144 slices shown (1 of 3)]
[im 1/144]
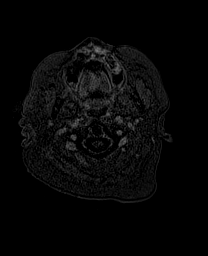
[im 21/144]
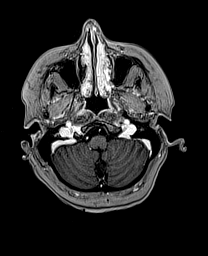
[im 41/144]
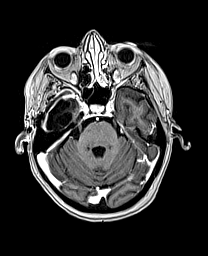
[im 62/144]
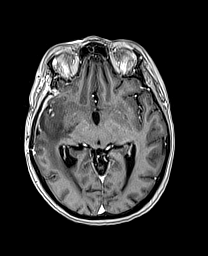
[im 82/144]
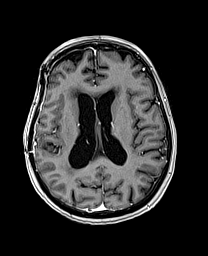
[im 103/144]
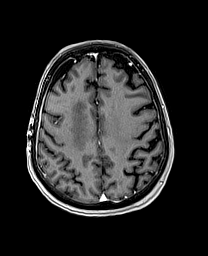
[im 123/144]
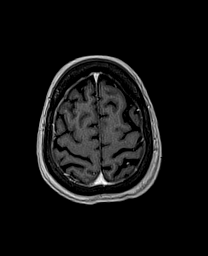
[im 144/144]
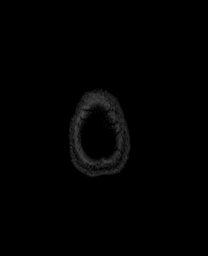

[Series 17: T1 · sagittal · 4.0mm · 0.94mm/px · 2 of 30 slices shown (3 of 4)]
[im 1/30]
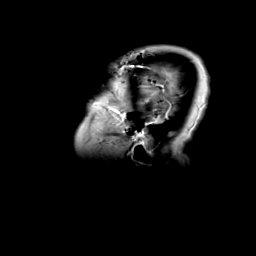
[im 30/30]
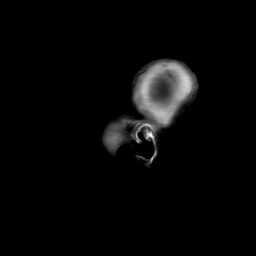

[Series 18: T1 · coronal · 4.0mm · 0.94mm/px · 2 of 30 slices shown (4 of 4)]
[im 1/30]
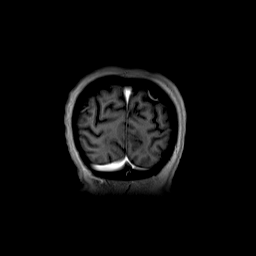
[im 30/30]
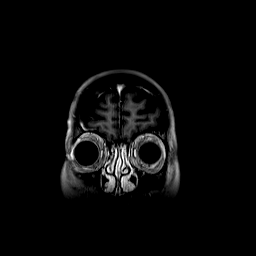

[Series 19: T1 post-contrast · coronal · 5.0mm · 0.43mm/px · 2 of 28 slices shown (2 of 3)]
[im 1/28]
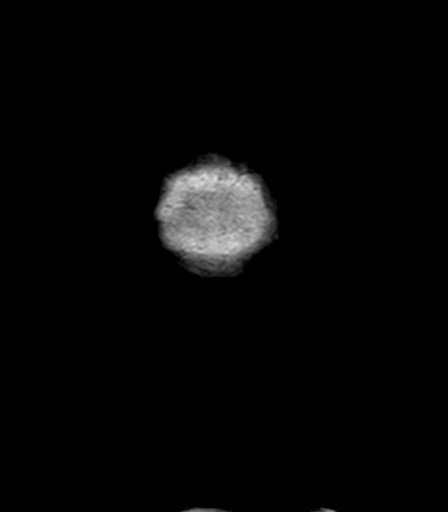
[im 28/28]
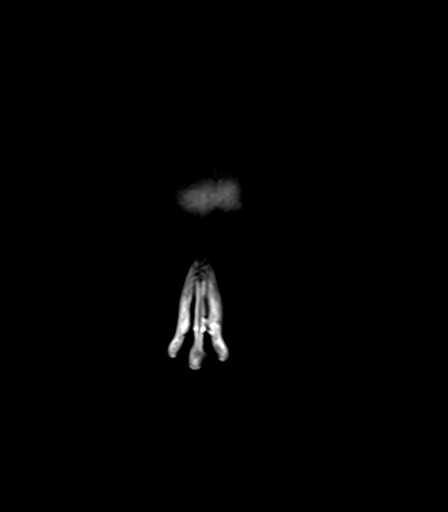

[Series 20: T1 post-contrast · sagittal · 5.0mm · 0.75mm/px · 1 of 24 slices shown (3 of 3)]
[im 1/24]
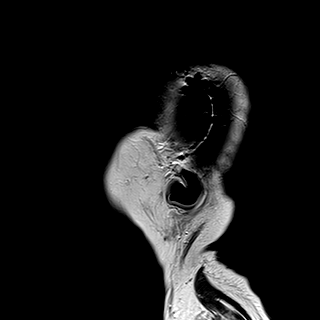

[48 of 48 positions shown; findings below may reference images not displayed]

FINDINGS: Brain: Rounded, masslike enhancement at the right midbrain and
cerebral peduncle has regressed and is now indistinct on series 16,
image 57. However, nearby Patchy and confluent enhancement at the
right optic radiations appears increased and is not obviously
vascular related (series 16, image 58 and also postcontrast coronal
series 19, image 16). Regional temporal lobe resection cavity has
not significantly changed in size or configuration. Stable
associated hemosiderin. And confluent regional T2 and FLAIR
hyperintensity is stable to mildly improved. However, there is new
discontinuous T2/FLAIR abnormality in the posteromedial right
thalamus on series 11, image 25. And ongoing discontinuous although
improved T2 hyperintensity in the right brainstem at the pons
(series 8, image 7).

Additionally, confluent right corona radiata T2 and FLAIR
hyperintensity has progressed since [REDACTED] but is facilitated on
diffusion (series 11, image 36), nonenhancing.

On DWI heterogeneity in the tumor region and right brainstem is not
significantly changed.

Post craniotomy dural thickening along the right middle cranial
fossa is stable. No midline shift. Stable ventricle size and
configuration. No restricted diffusion suggestive of acute
infarction. No acute intracranial hemorrhage. Cervicomedullary
junction and pituitary are within normal limits.

Stable gray and white matter signal elsewhere.

Vascular: Major intracranial vascular flow voids are stable. The
major dural venous sinuses are enhancing and appear to be patent.

Skull and upper cervical spine: Negative visible cervical spine and
spinal cord. Stable bone marrow signal.

Sinuses/Orbits: Stable and negative.

Other: Visible internal auditory structures appear normal. Stable
scalp and face.
IMPRESSION: 1. Mixed imaging response to therapy since [REDACTED]:
Regressed enhancement in the right midbrain, but new petechial
enhancement in the right optic radiations.
New discontinuous T2/FLAIR hyperintensity in both the medial right
thalamus and right corona radiata.
Otherwise essentially stable appearance of infiltrating tumor in the
right hemisphere and brainstem.

2. No new intracranial abnormality.

## 2021-12-27 MED ORDER — LACTATED RINGERS IV SOLN: INTRAVENOUS | Status: DC

## 2021-12-27 MED ORDER — SODIUM CHLORIDE 0.9% IV SOLUTION: Freq: Once | INTRAVENOUS | Status: AC

## 2021-12-27 MED ORDER — DILTIAZEM HCL 30 MG PO TABS
30.0000 mg | ORAL_TABLET | Freq: Three times a day (TID) | ORAL | Status: DC
  Administered 2021-12-28: 30 mg via ORAL
  Filled 2021-12-27: qty 1

## 2021-12-27 MED ORDER — PIPERACILLIN-TAZOBACTAM 3.375 G IVPB
3.3750 g | Freq: Three times a day (TID) | INTRAVENOUS | Status: AC
  Administered 2021-12-27 – 2021-12-29 (×8): 3.375 g via INTRAVENOUS
  Filled 2021-12-27 (×7): qty 50

## 2021-12-27 MED ORDER — DIPHENHYDRAMINE HCL 25 MG PO CAPS: 25.0000 mg | ORAL_CAPSULE | Freq: Once | ORAL | Status: DC

## 2021-12-27 MED ORDER — GADOBUTROL 1 MMOL/ML IV SOLN
5.0000 mL | Freq: Once | INTRAVENOUS | Status: AC | PRN
  Administered 2021-12-27: 5 mL via INTRAVENOUS

## 2021-12-27 MED ORDER — SODIUM CHLORIDE 0.9 % IV BOLUS
250.0000 mL | Freq: Once | INTRAVENOUS | Status: AC
  Administered 2021-12-27: 250 mL via INTRAVENOUS

## 2021-12-27 MED ORDER — VANCOMYCIN HCL IN DEXTROSE 1-5 GM/200ML-% IV SOLN
1000.0000 mg | INTRAVENOUS | Status: DC
Start: 2021-12-28 — End: 2021-12-28
  Administered 2021-12-28: 1000 mg via INTRAVENOUS
  Filled 2021-12-27: qty 200

## 2021-12-27 MED ORDER — FUROSEMIDE 10 MG/ML IJ SOLN
20.0000 mg | Freq: Once | INTRAMUSCULAR | Status: AC
  Administered 2021-12-27: 20 mg via INTRAVENOUS
  Filled 2021-12-27: qty 2

## 2021-12-27 NOTE — ED Notes (Signed)
Patient transported to MRI 

## 2021-12-27 NOTE — Plan of Care (Signed)
°  Problem: Clinical Measurements: °Goal: Respiratory complications will improve °Outcome: Progressing °  °Problem: Pain Managment: °Goal: General experience of comfort will improve °Outcome: Progressing °  °Problem: Safety: °Goal: Ability to remain free from injury will improve °Outcome: Progressing °  °

## 2021-12-27 NOTE — ED Notes (Signed)
Admitting provider at bedside.

## 2021-12-27 NOTE — Progress Notes (Addendum)
?   12/27/21 1400  ?Notify: Provider  ?Provider Name/Title Nita Sells MD  ?Date Provider Notified 12/27/21  ?Time Provider Notified 1400  ?Notification Type Page  ?Notification Reason Other (Comment) ?(Bradycardia)  ?Provider response See new orders  ?Date of Provider Response 12/27/21  ?Time of Provider Response 1400  ?No change in pt's status. Will continue with plan of care.  ?

## 2021-12-27 NOTE — Progress Notes (Signed)
PT Cancellation Note ? ?Patient Details ?Name: Patricia Stevenson ?MRN: [id number] ?DOB: April 02, 1961 ? ? ?Cancelled Treatment:    Reason Eval/Treat Not Completed: Medical issues which prohibited therapy--low platelets with order for transfusion. will hold PT today and check back another day as schedule allows. thanks. ? ? ? ?Doreatha Massed, PT ?Acute Rehabilitation  ?Office: [phone] ?Pager: [phone] ? ?  ?

## 2021-12-27 NOTE — ED Notes (Signed)
Pt incontinent of urine and small amount of stool in brief. Peri care provided, straight cath UA obtained, rectal temp still noted to be low so bair hugger reapplied. Pt hooked up to pure wick catheter. Recliner provided for pt's sister who will be staying the night with patient. Pt states she is comfortable and has no further needs. Admitting doctor at bedside at this time. ?

## 2021-12-27 NOTE — Progress Notes (Signed)
?   12/27/21 1353  ?Assess: MEWS Score  ?Temp 97.6 ?F (36.4 ?C)  ?BP 102/73  ?Pulse Rate (!) 43  ?Resp 11  ?SpO2 96 %  ?Assess: MEWS Score  ?MEWS Temp 0  ?MEWS Systolic 0  ?MEWS Pulse 1  ?MEWS RR 1  ?MEWS LOC 0  ?MEWS Score 2  ?MEWS Score Color Yellow  ?Assess: if the MEWS score is Yellow or Red  ?Were vital signs taken at a resting state? Yes  ?Focused Assessment No change from prior assessment  ?Does the patient meet 2 or more of the SIRS criteria? No  ?MEWS guidelines implemented *See Row Information* No, previously yellow, continue vital signs every 4 hours  ?Escalate  ?MEWS: Escalate Yellow: discuss with charge nurse/RN and consider discussing with provider and RRT  ?Notify: Charge Nurse/RN  ?Name of Charge Nurse/RN Notified Chancy Hurter  ?Date Charge Nurse/RN Notified 12/27/21  ?Time Charge Nurse/RN Notified 1353  ?Assess: SIRS CRITERIA  ?SIRS Temperature  0  ?SIRS Pulse 0  ?SIRS Respirations  0  ?SIRS WBC 0  ?SIRS Score Sum  0  ? ? ?

## 2021-12-27 NOTE — Consult Note (Signed)
? ?[facility]  ?[phone]  ? ?HEMATOLOGY ONCOLOGY INPATIENT CONSULTATION  ? ?ADEJA SARRATT  DOB: 07-May-1961  MR#: [id number]  CSN#: [id number]   ? ?Requesting Physician: [facility] and Dr. Mickeal Skinner  ? ?Patient Care Team: ?Isaac Bliss, Rayford Halsted, MD as PCP - General (Internal Medicine) ?Janina Mayo, MD as PCP - Cardiology (Cardiology) ? ?Reason for consult: Anemia and thrombocytopenia ? ?History of present illness: [name] is a 61 year old female with past medical history of high-grade glioma, recently finished chemo and radiation, presented with progressive weakness, chills, and anorexia.  She was admitted for further management.  I was called by Drs. Samtani and Vaslow to evaluate her progressive anemia and thrombocytopenia. ? ?Her high-grade glioma was diagnosed in January 2023, status post surgical resection, and she recently completed concurrent chemoradiation with Temodar.  Last dose of radiation was on December 11, 2021, per patient's husband, Temodar was stopped 1 week earlier before her last dose of radiation due to thrombocytopenia.  Her previous CBC was normal until December 08, 2021, WBC dropped to 3.1, and platelet count dropped to 39k, then Temodar has been held since then.  Since she completed radiation, she has developed a worsening fatigue, low appetite, and that she has been bedbound and not eating much at home in the past 2 to 3 days.  Her husband also reports chills at home, but no fever.  Patient was very drowsy in the bed, did not talk much, but was able to answer simple questions.  In the ED, she had episode of hypotension, and rectal temperature was low, she was treated with IV fluids and antibiotics for presumed sepsis.  CBC last night showed WBC 1.9, hemoglobin 11.6, platelet 20.  No noticeable signs of bleeding.  Neutrophil 1.7.  ? ?MEDICAL HISTORY:  ?Past Medical History:  ?Diagnosis Date  ? GERD (gastroesophageal reflux disease)   ? Hyperlipidemia   ?  Vitamin D deficiency   ? ? ?SURGICAL HISTORY: ?Past Surgical History:  ?Procedure Laterality Date  ? APPLICATION OF CRANIAL NAVIGATION Right 09/15/2021  ? Procedure: APPLICATION OF CRANIAL NAVIGATION;  Surgeon: Vallarie Mare, MD;  Location: [facility];  Service: Neurosurgery;  Laterality: Right;  ? BREAST BIOPSY Left   ? 20 years ago in [place]- benign- not sure if bx or cyst  ? CHOLECYSTECTOMY    ? Laparoscopic cholecystectomy 04/29/2021  ? CRANIOTOMY Right 09/15/2021  ? Procedure: Right Craniotomy Temporal Lobectomy with Brain Lab;  Surgeon: Vallarie Mare, MD;  Location: [facility];  Service: Neurosurgery;  Laterality: Right;  ? ? ?SOCIAL HISTORY: ?Social History  ? ?Socioeconomic History  ? Marital status: Married  ?  Spouse name: Not on file  ? Number of children: Not on file  ? Years of education: Not on file  ? Highest education level: Not on file  ?Occupational History  ? Not on file  ?Tobacco Use  ? Smoking status: Never  ? Smokeless tobacco: Never  ?Vaping Use  ? Vaping Use: Not on file  ?Substance and Sexual Activity  ? Alcohol use: No  ? Drug use: No  ? Sexual activity: Not on file  ?Other Topics Concern  ? Not on file  ?Social History Narrative  ? Not on file  ? ?Social Determinants of Health  ? ?Financial Resource Strain: Not on file  ?Food Insecurity: Not on file  ?Transportation Needs: Not on file  ?Physical Activity: Not on file  ?Stress: Not on file  ?Social Connections: Not  on file  ?Intimate Partner Violence: Not on file  ? ? ?FAMILY HISTORY: ?Family History  ?Problem Relation Age of Onset  ? Breast cancer Neg Hx   ? CAD Neg Hx   ? ? ?ALLERGIES:  is allergic to latex. ? ?MEDICATIONS:  ?Current Facility-Administered Medications  ?Medication Dose Route Frequency Provider Last Rate Last Admin  ? acetaminophen (TYLENOL) tablet 650 mg  650 mg Oral Q6H PRN Marcelyn Bruins, MD   650 mg at 12/27/21 0539  ? Or  ? acetaminophen (TYLENOL) suppository 650 mg  650 mg Rectal Q6H PRN Marcelyn Bruins, MD       ? dexamethasone (DECADRON) tablet 4 mg  4 mg Oral BID WC Marcelyn Bruins, MD   4 mg at 12/27/21 0736  ? dicyclomine (BENTYL) capsule 10 mg  10 mg Oral BID PRN Marcelyn Bruins, MD   10 mg at 12/27/21 0539  ? diltiazem (CARDIZEM CD) 24 hr capsule 120 mg  120 mg Oral Daily Marcelyn Bruins, MD   120 mg at 12/27/21 8413  ? fluconazole (DIFLUCAN) tablet 100 mg  100 mg Oral Daily Marcelyn Bruins, MD   100 mg at 12/27/21 2440  ? lactated ringers infusion   Intravenous Continuous Marcelyn Bruins, MD 125 mL/hr at 12/27/21 0316 New Bag at 12/27/21 0316  ? levETIRAcetam (KEPPRA) tablet 500 mg  500 mg Oral BID Marcelyn Bruins, MD   500 mg at 12/27/21 1027  ? ondansetron (ZOFRAN) tablet 4 mg  4 mg Oral Q6H PRN Marcelyn Bruins, MD      ? Or  ? ondansetron [facility]) injection 4 mg  4 mg Intravenous Q6H PRN Marcelyn Bruins, MD   4 mg at 12/27/21 0539  ? piperacillin-tazobactam (ZOSYN) IVPB 3.375 g  3.375 g Intravenous Q8H Emiliano Dyer, [place]   Stopped at 12/27/21 1112  ? polyethylene glycol (MIRALAX / GLYCOLAX) packet 17 g  17 g Oral Daily PRN Marcelyn Bruins, MD      ? sodium chloride flush (NS) 0.9 % injection 3 mL  3 mL Intravenous Q12H Marcelyn Bruins, MD      ? Derrill Memo ON 12/28/2021] vancomycin (VANCOCIN) IVPB 1000 mg/200 mL premix  1,000 mg Intravenous Q24H Emiliano Dyer, RPH      ? ?Current Outpatient Medications  ?Medication Sig Dispense Refill  ? acetaminophen (TYLENOL) 500 MG tablet Take 1,000 mg by mouth every 6 (six) hours as needed for moderate pain or headache.    ? Ascorbic Acid (VITA-C PO) Take 500 mg by mouth daily. Unsure of dose    ? calcium carbonate (OSCAL) 1500 (600 Ca) MG TABS tablet Take 1,500 mg by mouth 2 (two) times daily with a meal.    ? dexamethasone (DECADRON) 4 MG tablet Take 1 tablet (4 mg total) by mouth 2 (two) times daily with a meal. 60 tablet 1  ? dicyclomine (BENTYL) 10 MG capsule Take 1 capsule (10 mg total) by mouth in the morning and at  bedtime. As needed (Patient taking differently: Take 10 mg by mouth 2 (two) times daily as needed for spasms.) 90 capsule 0  ? diltiazem (CARDIZEM CD) 120 MG 24 hr capsule Take 1 capsule (120 mg total) by mouth daily. 60 capsule 3  ? famotidine (PEPCID) 20 MG tablet Take 1 tablet (20 mg total) by mouth 2 (two) times daily. 60 tablet 3  ? levETIRAcetam (KEPPRA) 500 MG tablet Take 1 tablet (500 mg total) by mouth 2 (  two) times daily. 60 tablet 2  ? ondansetron (ZOFRAN) 8 MG tablet Take 1 tablet (8 mg total) by mouth 2 (two) times daily as needed (nausea and vomiting). May take 30-60 minutes prior to Temodar administration if nausea/vomiting occurs. 30 tablet 1  ? VITAMIN D PO Take 1,000 Units by mouth daily.    ? ? ?REVIEW OF SYSTEMS:   ?Constitutional: Denies fevers, (+) chills, no night sweats, (+) general weakness ?Eyes: Denies blurriness of vision, double vision or watery eyes ?Ears, nose, mouth, throat, and face: Denies mucositis or sore throat ?Respiratory: Denies cough, dyspnea or wheezes ?Cardiovascular: Denies palpitation, chest discomfort or lower extremity swelling ?Gastrointestinal:  Denies nausea, heartburn or change in bowel habits ?Skin: Denies abnormal skin rashes ?Lymphatics: Denies new lymphadenopathy or easy bruising ?All other systems were reviewed with the patient and are negative. ? ?PHYSICAL EXAMINATION: ?ECOG PERFORMANCE STATUS: 3 ? ?Vitals:  ? 12/27/21 1110 12/27/21 1128  ?BP: 101/64 101/64  ?Pulse: (!) 49 (!) 51  ?Resp: 16 13  ?Temp:  (!) 97.5 ?F (36.4 ?C)  ?SpO2: 97% 95%  ? ?There were no vitals filed for this visit. ? ?GENERAL: Drowsy, easily arousable ?SKIN: skin color, texture, turgor are normal, no rashes or significant lesions, no ecchymosis or petechia ?EYES: normal, conjunctiva are pink and non-injected, sclera clear ?OROPHARYNX:no exudate, no erythema and lips, buccal mucosa, and tongue normal  ?NECK: supple, thyroid normal size, non-tender, without nodularity ?LYMPH:  no palpable  lymphadenopathy in the cervical, axillary or inguinal ?LUNGS: clear to auscultation and percussion with normal breathing effort ?HEART: regular rate & rhythm and no murmurs and no lower extremity edema ?ABDOM

## 2021-12-27 NOTE — Progress Notes (Signed)
Pharmacy Antibiotic Note ? ?Patricia Stevenson is a 61 y.o. female admitted on 12/26/2021 with sepsis.  Pharmacy has been consulted for vancomycin and zosyn dosing. ? ?Plan: ?Zosyn 3.375gm IV q8h (4hr extended infusions) ?Vancomycin 1g IV q24h for estimated AUC 503 rounding SCr to 0.8 ?Check vancomycin levels at steady state ?Follow up renal function & cultures ?  ? ?Temp (24hrs), Avg:96.8 ?F (36 ?C), Min:95.9 ?F (35.5 ?C), Max:97.7 ?F (36.5 ?C) ? ?Recent Labs  ?Lab 12/22/21 ?1509 12/26/21 ?6283 12/26/21 ?2258 12/27/21 ?0559  ?WBC 2.5* 1.9*  --  1.8*  ?CREATININE 0.49 0.41*  --  0.34*  ?LATICACIDVEN  --  2.7* 2.4*  --   ?  ?Estimated Creatinine Clearance: 55.1 mL/min (A) (by C-G formula based on SCr of 0.34 mg/dL (L)).   ? ?Allergies  ?Allergen Reactions  ? Latex   ? ? ?Antimicrobials this admission: ?5/7 Vanc >> ?5/7 Zosyn >> ? ?Dose adjustments this admission: ? ?Microbiology results: ?5/6 BCx: ?5/6 UCx: ?5/7 MRSA PCR: ? ?Thank you for allowing pharmacy to be a part of this patient?s care. ? ?Emiliano Dyer ?12/27/2021 7:20 AM ? ?

## 2021-12-27 NOTE — ED Notes (Signed)
Pt stated that she was hot, so bair hugger turned off. Pt's oral temp 97.5 - bair hugger removed. ?

## 2021-12-27 NOTE — Progress Notes (Signed)
?PROGRESS NOTE ? ? ?Patricia Stevenson  IFO:[id number] DOB: 1960/09/22 DOA: 12/26/2021 ?PCP: Isaac Bliss, Rayford Halsted, MD  ?Brief Narrative:  ?61 year old female Spanish-speaking lives at home with husband ?Status post lap chole 05/29/2021 Dr. Rosendo Gros, follows with Dr. Silverio Decamp for gastritis/?  IBS diverticulosis on scopes 01/26/2021 ?P A-fib diagnosed 09/17/2021 follows with Dr. 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 Mickeal Skinner ?Last dose of Temodar April 21[?]--Stopped because of TCP ?See oncology notes seems like she has had drop in cell couns since 12/08/2021 ?After radiation has done poorly has had low appetite and has been bedbound for the 2 to 3 days prior to admission admission 5/6 ? ?Hospital-Problem based course ? ?Sepsis on admission?  Probably from neutropenic fever ?Hypotension, hypothermia and some bradycardia on admission ?Lactic acid also elevated-met at least 2 SIRS criteria ?Empirically covering fluconazole 100 daily, Zosyn every 8, vancomycin ?Follow BC X2, UC from 5/6--lactic acid ptosis is present however procalcitonin is less than 0.1 so this could just be SIRS and chemo related side effects? ?Does not have meningismus therefore hold LP at this time-defer to neuro oncologist ?Pancytopenia query cause ?Appreciative of oncology expertise-platelets are 38 we will transfuse 1 unit of platelets today as does not seem indicative of TTP per Dr. Burr Medico based on smear--no schistocytes present and platelet morphology is normal ?DIC work-up in progress ?Temodar carries a grade 3, grade 4 adverse events risk of lymphocytopenia and 55% of cases and is probably the culprit ?Given ANC is 1.6 Neupogen probably is indicated but defer the same to oncology ?Would not place on neutropenic precautions unless ANC is below  1000 ?Glioblastoma status postresection + XRT under care of Dr. Mickeal Skinner neuro oncologist ?As per medical and neuro oncologist ?Continues on AED 500 twice daily Keppra, Decadron 4 twice daily ?Bradycardia noted this admission as low as 45 ?Patient developed paroxysmal A-fib last hospitalization is not on anticoagulation given CHA2DS2-VASc about 1-is on Cardizem CD 120 which I have changed to Cardizem 30 every 8-monitor on telemetry-given to 50 cc bolus 5/7-expect that this will resolve once her CCB washes out of her system ?Check a.m. magnesium ?AKI on admit ?Has been consistently in the 24/0.5 range since chemo ?Continuing fluids as above-resolving ?Transaminitis ?Probably acquired from either sepsis versus chemo effect-ALT is > AST signifying liver pathology ?Consider imaging if has further symptoms or pain ?Stable medical issues ?Status post lap chole 05/2021, IBS, gastritis ? ? ?DVT prophylaxis: SCD at this time secondary to pancytopenia f ?Code Status: Full CODE STATUS ?Family Communication: Discussed with husband at bedside in detail also spoke with patient's daughter who is on the phone ?Disposition:  ?Status is: Inpatient ?Remains inpatient appropriate because:  ? ?Patient remains pancytopenic and probably septic from febrile neutropenia-monitor at least for next 48 hours ?  ?Consultants:  ?Oncology Dr. Morey Hummingbird ?Neuro-oncology Dr. Mickeal Skinner ? ?Procedures: MRI brain ? ?Antimicrobials: As above ? ? ?Subjective: ?Patient seen and examined in room with interpreter for entirety of visit-husband at bedside as well and all questions answered ? ?States that she is doing fair-feels a little better than yesterday-- neck pain, - headache, - blurred vision, - double vision ?No cough no cold-no dysuria ?Has eaten fair today overall just feels weak ? ?I spoke with Dr. Mickeal Skinner over secure chat and he feels that the patient's MRI is  actually improved from prior and I shared this with the family--he indicates that he will be seeing  her at some point ? ? ?Objective: ?Vitals:  ? 12/27/21 1128 12/27/21 1202 12/27/21 1353 12/27/21 1508  ?BP: 101/64 114/66 102/73 122/69  ?Pulse: (!) 51 (!) 46 (!) 43 (!) 43  ?Resp: _0 ?Temp: (!) 97.5 ?F (36.4 ?C) (!) 97.5 ?F (36.4 ?C) 97.6 ?F (36.4 ?C) 97.6 ?F (36.4 ?C)  ?TempSrc: Axillary Oral Oral Oral  ?SpO2: 95% 97% 96% 96%  ? ? ?Intake/Output Summary (Last 24 hours) at 12/27/2021 1518 ?Last data filed at 12/27/2021 0314 ?Gross per 24 hour  ?Intake 947.26 ml  ?Output --  ?Net 947.26 ml  ? ?There were no vitals filed for this visit. ? ?Examination: ? ?Cushingoid like facies Hispanic female ?EOMI NCAT--moderate dentition Mallampati 2 ?Neck soft supple no meningismus cannot appreciate any bruit no LAM ?S1-S2 slight tachycardia when I saw her-sinus on monitors ?Abdomen is slightly distended no rebound no guarding no right upper quadrant tenderness cannot appreciate HSM ?No lower extremity edema ?Neurologically intact moving all 4 limbs equally ?Finger-nose-finger intact ?Vision by direct confrontation is intact ?Power is 5/5 ?Reflexes deferred ?Sensory is intact ?Psych euthymic congruent ? ?Data Reviewed: personally reviewed  ? ?CBC ?   ?Component Value Date/Time  ? WBC 1.8 (L) 12/27/2021 0559  ? RBC 3.25 (L) 12/27/2021 0559  ? RBC 3.18 (L) 12/27/2021 0559  ? HGB 10.8 (L) 12/27/2021 0559  ? HGB 12.9 12/22/2021 1509  ? HCT 29.9 (L) 12/27/2021 0559  ? PLT 18 (LL) 12/27/2021 0559  ? PLT 23 (L) 12/22/2021 1509  ? MCV 92.0 12/27/2021 0559  ? MCH 33.2 12/27/2021 0559  ? MCHC 36.1 (H) 12/27/2021 0559  ? RDW 15.9 (H) 12/27/2021 0559  ? LYMPHSABS 0.1 (L) 12/27/2021 0559  ? MONOABS 0.1 12/27/2021 0559  ? EOSABS 0.0 12/27/2021 0559  ? BASOSABS 0.0 12/27/2021 0559  ? ? ?  Latest Ref Rng & Units 12/27/2021  ?  5:59 AM 12/26/2021  ?  8:58 PM 12/22/2021  ?  3:09 PM  ?CMP  ?Glucose 70 - 99 mg/dL 135   197   124    ?BUN 6 - 20 mg/dL _1 ?Creatinine 0.44 - 1.00 mg/dL 0.34   0.41   0.49    ?Sodium 135 - 145 mmol/L 134   132    131    ?Potassium 3.5 - 5.1 mmol/L 3.8   4.2   4.8    ?Chloride 98 - 111 mmol/L 104   99   98    ?CO2 22 - 32 mmol/L _2 ?Calcium 8.9 - 10.3 mg/dL 8.1   7.9   9.3    ?Total Protein 6.5 - 8.1 g/dL 4.7   5.2   6.2    ?Total Bilirubin 0.3 - 1.2 mg/dL 0.5   0.6   0.4    ?Alkaline Phos 38 - 126 U/L 41   53   57    ?AST 15 - 41 U/L _3 ?ALT 0 - 44 U/L 69   80   73    ? ? ? ?Radiology Studies: ?CT Head Wo Contrast ? ?Result Date: 12/26/2021 ?CLINICAL DATA:  Altered mental status EXAM: CT HEAD WITHOUT CONTRAST TECHNIQUE: Contiguous axial images were obtained from the base of the skull through the vertex  without intravenous contrast. RADIATION DOSE REDUCTION: This exam was performed according to the departmental dose-optimization program which includes automated exposure control, adjustment of the mA and/or kV according to patient size and/or use of iterative reconstruction technique. COMPARISON:  None Available. FINDINGS: Brain: Right temporal lobe encephalomalacia at site of prior tumor resection. There is hyperdensity near the right cerebral peduncle corresponding to previously demonstrated area of tumor. No acute hemorrhage or extra-axial collection. Generalized volume loss with periventricular white matter changes. Vascular: No abnormal hyperdensity of the major intracranial arteries or dural venous sinuses. No intracranial atherosclerosis. Skull: Remote right craniotomy. Sinuses/Orbits: No fluid levels or advanced mucosal thickening of the visualized paranasal sinuses. No mastoid or middle ear effusion. The orbits are normal. IMPRESSION: 1. No acute intracranial abnormality. 2. Right temporal lobe encephalomalacia at site of prior tumor resection. Electronically Signed   By: Ulyses Jarred M.D.   On: 12/26/2021 23:07  ? ?MR BRAIN W WO CONTRAST ? ?Result Date: 12/27/2021 ?CLINICAL DATA:  61 year old female with high-grade glioma status post subtotal resection. Altered mental status. EXAM: MRI HEAD  WITHOUT AND WITH CONTRAST TECHNIQUE: Multiplanar, multiecho pulse sequences of the brain and surrounding structures were obtained without and with intravenous contrast. CONTRAST:  20m GADAVIST GADOBUTR

## 2021-12-28 ENCOUNTER — Ambulatory Visit: Payer: 59 | Admitting: Occupational Therapy

## 2021-12-28 ENCOUNTER — Ambulatory Visit: Payer: 59 | Admitting: Physical Therapy

## 2021-12-28 DIAGNOSIS — D61818 Other pancytopenia: Secondary | ICD-10-CM | POA: Diagnosis not present

## 2021-12-28 DIAGNOSIS — R5081 Fever presenting with conditions classified elsewhere: Secondary | ICD-10-CM

## 2021-12-28 DIAGNOSIS — A419 Sepsis, unspecified organism: Secondary | ICD-10-CM

## 2021-12-28 DIAGNOSIS — C719 Malignant neoplasm of brain, unspecified: Secondary | ICD-10-CM | POA: Diagnosis not present

## 2021-12-28 DIAGNOSIS — D709 Neutropenia, unspecified: Secondary | ICD-10-CM | POA: Diagnosis not present

## 2021-12-28 DIAGNOSIS — R6520 Severe sepsis without septic shock: Secondary | ICD-10-CM

## 2021-12-28 LAB — CBC WITH DIFFERENTIAL/PLATELET
Abs Immature Granulocytes: 0.02 10*3/uL (ref 0.00–0.07)
Basophils Absolute: 0 10*3/uL (ref 0.0–0.1)
Basophils Relative: 0 %
Eosinophils Absolute: 0 10*3/uL (ref 0.0–0.5)
Eosinophils Relative: 0 %
HCT: 29.7 % — ABNORMAL LOW (ref 36.0–46.0)
Hemoglobin: 10.5 g/dL — ABNORMAL LOW (ref 12.0–15.0)
Immature Granulocytes: 1 %
Lymphocytes Relative: 4 %
Lymphs Abs: 0.1 10*3/uL — ABNORMAL LOW (ref 0.7–4.0)
MCH: 32.7 pg (ref 26.0–34.0)
MCHC: 35.4 g/dL (ref 30.0–36.0)
MCV: 92.5 fL (ref 80.0–100.0)
Monocytes Absolute: 0.1 10*3/uL (ref 0.1–1.0)
Monocytes Relative: 4 %
Neutro Abs: 1.5 10*3/uL — ABNORMAL LOW (ref 1.7–7.7)
Neutrophils Relative %: 91 %
Platelets: 43 10*3/uL — ABNORMAL LOW (ref 150–400)
RBC: 3.21 MIL/uL — ABNORMAL LOW (ref 3.87–5.11)
RDW: 15.7 % — ABNORMAL HIGH (ref 11.5–15.5)
WBC: 1.7 10*3/uL — ABNORMAL LOW (ref 4.0–10.5)
nRBC: 0 % (ref 0.0–0.2)

## 2021-12-28 LAB — COMPREHENSIVE METABOLIC PANEL
ALT: 66 U/L — ABNORMAL HIGH (ref 0–44)
AST: 22 U/L (ref 15–41)
Albumin: 2.6 g/dL — ABNORMAL LOW (ref 3.5–5.0)
Alkaline Phosphatase: 40 U/L (ref 38–126)
Anion gap: 9 (ref 5–15)
BUN: 16 mg/dL (ref 6–20)
CO2: 26 mmol/L (ref 22–32)
Calcium: 8.3 mg/dL — ABNORMAL LOW (ref 8.9–10.3)
Chloride: 100 mmol/L (ref 98–111)
Creatinine, Ser: 0.5 mg/dL (ref 0.44–1.00)
GFR, Estimated: >60 mL/min (ref >60)
Glucose, Bld: 151 mg/dL — ABNORMAL HIGH (ref 70–99)
Potassium: 3.5 mmol/L (ref 3.5–5.1)
Sodium: 135 mmol/L (ref 135–145)
Total Bilirubin: 0.8 mg/dL (ref 0.3–1.2)
Total Protein: 4.9 g/dL — ABNORMAL LOW (ref 6.5–8.1)

## 2021-12-28 LAB — BPAM PLATELET PHERESIS
Blood Product Expiration Date: 202305092359
ISSUE DATE / TIME: 202305071528
Unit Type and Rh: 5100

## 2021-12-28 LAB — PREPARE PLATELET PHERESIS: Unit division: 0

## 2021-12-28 LAB — MAGNESIUM: Magnesium: 2.4 mg/dL (ref 1.7–2.4)

## 2021-12-28 LAB — PROTIME-INR
INR: 1 (ref 0.8–1.2)
Prothrombin Time: 12.9 seconds (ref 11.4–15.2)

## 2021-12-28 LAB — URINE CULTURE: Culture: NO GROWTH

## 2021-12-28 LAB — PROCALCITONIN: Procalcitonin: <0.1 ng/mL

## 2021-12-28 NOTE — Evaluation (Addendum)
Physical Therapy Evaluation ?Patient Details ?Name: Patricia Stevenson ?MRN: [id number] ?DOB: [birth year] ?Today's Date: 12/28/2021 ? ?History of Present Illness ? 61 yo female admitted with SIRS. She is currently undergoing chemo-radiation for brain cancer. Per family, has had a pretty quick decline over last week or so prior to this admission. Hx of craniotomy 08/2021, residual L sided weakness,  lap chole 10/22, Afib, glioblastoma, falls,  ?Clinical Impression ? On eval, pt required Mod A to roll in bed and Mod-Max +2 assist for supine<>sit. Pt sat EOB for at least 5 minutes with Min guard assist before fatiguing. She quickly tires with minimal activity and also reports generalized all over body pain. Family was present during session and provided PLOF/home info (telepad interpreter utilized as well during session). Discussed d/c plan with family (sister)-plan is currently for pt to return home with family assisting as needed. They would like to have HHPT instead of OP PT at this juncture-pt is currently not able to leave home to get to outpatient therapy. Will plan to follow and progress activity as pt is able to tolerate.    ?   ? ?Recommendations for follow up therapy are one component of a multi-disciplinary discharge planning process, led by the attending physician.  Recommendations may be updated based on patient status, additional functional criteria and insurance authorization. ? ?Follow Up Recommendations Home health PT (as long as family feels they can continue to provide care at current level) ? ?  ?Assistance Recommended at Discharge Frequent or constant Supervision/Assistance  ?Patient can return home with the following ? Two people to help with walking and/or transfers;Two people to help with bathing/dressing/bathroom;Assist for transportation;Help with stairs or ramp for entrance;Assistance with cooking/housework ? ?  ?Equipment Recommendations Wheelchair;Hospital bed;BSC/3in1 (may need PTAR transport home-pt  lives on 2nd level)  ?Recommendations for Other Services ?    ?  ?Functional Status Assessment Patient has had a recent decline in their functional status and demonstrates the ability to make significant improvements in function in a reasonable and predictable amount of time.  ? ?  ?Precautions / Restrictions Precautions ?Precautions: Fall ?Precaution Comments: incontinent ?Restrictions ?Weight Bearing Restrictions: No  ? ?  ? ?Mobility ? Bed Mobility ?Overal bed mobility: Needs Assistance ?Bed Mobility: Supine to Sit, Sit to Supine, Rolling ?Rolling: Mod assist ?  ?Supine to sit: +2 for safety/equipment, Mod assist, +2 for physical assistance ?Sit to supine: Max assist, +2 for physical assistance, +2 for safety/equipment ?  ?General bed mobility comments: Assist for trunk and bil LEs. Utilized bedpad for scooting, positiong. Increased time. Multimodal cueing provided. Pt fatigues very quickly. ?  ? ?Transfers ?  ?  ?  ?  ?  ?  ?  ?  ?  ?General transfer comment: Pt unable to attempt standing on today due to weakness, fatigue, pain, poor activity tolerance. ?  ? ?Ambulation/Gait ?  ?  ?  ?  ?  ?  ?  ?  ? ?Stairs ?  ?  ?  ?  ?  ? ?Wheelchair Mobility ?  ? ?Modified Rankin (Stroke Patients Only) ?  ? ?  ? ?Balance Overall balance assessment: Needs assistance, History of Falls ?Sitting-balance support: Bilateral upper extremity supported, Feet supported ?Sitting balance-Leahy Scale: Fair ?Sitting balance - Comments: Sat with Min guard assist for at least 5 minutes before fatiguing and wanting to return to bed ?  ?  ?Standing balance-Leahy Scale: Zero ?  ?  ?  ?  ?  ?  ?  ?  ?  ?  ?  ?  ?   ? ? ? ?  Pertinent Vitals/Pain Pain Assessment ?Pain Assessment: Faces ?Faces Pain Scale: Hurts even more ?Pain Location: "all over" ?Pain Descriptors / Indicators: Discomfort, Grimacing, Sore ?Pain Intervention(s): Limited activity within patient's tolerance, Monitored during session, Repositioned  ? ? ?Home Living Family/patient  expects to be discharged to:: Private residence ?Living Arrangements: Spouse/significant other;Children;Other relatives ?Available Help at Discharge: Family;Available 24 hours/day ?Type of Home: Apartment ?Home Access: Stairs to enter ?Entrance Stairs-Rails: Left ?Entrance Stairs-Number of Steps: 1 flight ?  ?Home Layout: One level ?Home Equipment: Rolling Walker (2 wheels);BSC/3in1 ?   ?  ?Prior Function Prior Level of Function : Needs assist ?  ?  ?  ?  ?  ?  ?Mobility Comments: was ambulatory with RW and able to negotiate stairs with assistance up until about a week prior to this admission ?ADLs Comments: some assistance with bathing/dressing up until about a week prior to this admissoin. ?  ? ? ?Hand Dominance  ?   ? ?  ?Extremity/Trunk Assessment  ? Upper Extremity Assessment ?Upper Extremity Assessment: Defer to OT evaluation ?  ? ?Lower Extremity Assessment ?Lower Extremity Assessment:  (Hip flex 3-/5, knee ext 3+/5) ?  ? ?Cervical / Trunk Assessment ?Cervical / Trunk Assessment: Normal  ?Communication  ? Communication: Prefers language other than English (spanish)  ?Cognition Arousal/Alertness: Awake/alert, Lethargic ?Behavior During Therapy: Flat affect, WFL for tasks assessed/performed ?  ?Area of Impairment: Following commands, Problem solving ?  ?  ?  ?  ?  ?  ?  ?  ?  ?  ?  ?Following Commands: Follows one step commands inconsistently ?  ?  ?Problem Solving: Slow processing, Decreased initiation, Difficulty sequencing, Requires verbal cues, Requires tactile cues ?  ?  ?  ? ?  ?General Comments   ? ?  ?Exercises    ? ?Assessment/Plan  ?  ?PT Assessment Patient needs continued PT services  ?PT Problem List Decreased strength;Decreased mobility;Decreased activity tolerance;Decreased balance;Decreased knowledge of use of DME;Pain;Decreased cognition;Decreased coordination ? ?   ?  ?PT Treatment Interventions DME instruction;Gait training;Therapeutic activities;Therapeutic exercise;Patient/family  education;Balance training;Functional mobility training   ? ?PT Goals (Current goals can be found in the Care Plan section)  ?Acute Rehab PT Goals ?Patient Stated Goal: to get better. less pain. ?PT Goal Formulation: With patient/family ?Time For Goal Achievement: 01/11/22 ?Potential to Achieve Goals: Fair ? ?  ?Frequency Min 3X/week ?  ? ? ?Co-evaluation   ?  ?  ?  ?  ? ? ?  ?AM-PAC PT "6 Clicks" Mobility  ?Outcome Measure Help needed turning from your back to your side while in a flat bed without using bedrails?: A Lot ?Help needed moving from lying on your back to sitting on the side of a flat bed without using bedrails?: A Lot ?Help needed moving to and from a bed to a chair (including a wheelchair)?: Total ?Help needed standing up from a chair using your arms (e.g., wheelchair or bedside chair)?: Total ?Help needed to walk in hospital room?: Total ?Help needed climbing 3-5 steps with a railing? : Total ?6 Click Score: 8 ? ?  ?End of Session   ?Activity Tolerance: Patient limited by fatigue;Patient limited by pain ?Patient left: in bed;with call bell/phone within reach;with family/visitor present ?Nurse Communication:  (made NT aware pt was on bedpad and would need assistance with pt care/hygiene and replacement of purewick) ?PT Visit Diagnosis: History of falling (Z91.81);Muscle weakness (generalized) (M62.81);Difficulty in walking, not elsewhere classified (R26.2);Pain;Adult, failure to thrive (R62.7);Other abnormalities of gait and  mobility (R26.89) ?  ? ?Time: 4199-1444 ?PT Time Calculation (min) (ACUTE ONLY): 37 min ? ? ?Charges:   PT Evaluation ?$PT Eval Moderate Complexity: 1 Mod ?  ?  ?   ? ? ? ? ? ?Merilynn Haydu P, PT ?Acute Rehabilitation  ?Office: [phone] ?Pager: [phone] ? ?  ? ?

## 2021-12-28 NOTE — Consult Note (Signed)
[facility] ?Neuro-Oncology Consult Note ? ?Patient Care Team: ?Isaac Bliss, Rayford Halsted, MD as PCP - General (Internal Medicine) ?Janina Mayo, MD as PCP - Cardiology (Cardiology) ? ?CHIEF COMPLAINTS/PURPOSE OF CONSULTATION:  ?Glioblastoma ?Sepsis ? ?HISTORY OF PRESENTING ILLNESS:  ?Patricia Stevenson 61 y.o. female presented to medical attention with several days of lethargy, failure to thrive, bedbound.  She was found to be hypotensive and hypothermic in ED, with pancytopenia.  During admission she denies any specific complaint, feels "a bit better" compared to before.  No worsening weakness of her left side compared to baseline. ? ?MEDICAL HISTORY:  ?Past Medical History:  ?Diagnosis Date  ? GERD (gastroesophageal reflux disease)   ? Hyperlipidemia   ? Vitamin D deficiency   ? ? ?SURGICAL HISTORY: ?Past Surgical History:  ?Procedure Laterality Date  ? APPLICATION OF CRANIAL NAVIGATION Right 09/15/2021  ? Procedure: APPLICATION OF CRANIAL NAVIGATION;  Surgeon: Vallarie Mare, MD;  Location: [facility];  Service: Neurosurgery;  Laterality: Right;  ? BREAST BIOPSY Left   ? 20 years ago in [place]- benign- not sure if bx or cyst  ? CHOLECYSTECTOMY    ? Laparoscopic cholecystectomy 04/29/2021  ? CRANIOTOMY Right 09/15/2021  ? Procedure: Right Craniotomy Temporal Lobectomy with Brain Lab;  Surgeon: Vallarie Mare, MD;  Location: [facility];  Service: Neurosurgery;  Laterality: Right;  ? ? ?SOCIAL HISTORY: ?Social History  ? ?Socioeconomic History  ? Marital status: Married  ?  Spouse name: Not on file  ? Number of children: Not on file  ? Years of education: Not on file  ? Highest education level: Not on file  ?Occupational History  ? Not on file  ?Tobacco Use  ? Smoking status: Never  ? Smokeless tobacco: Never  ?Vaping Use  ? Vaping Use: Not on file  ?Substance and Sexual Activity  ? Alcohol use: No  ? Drug use: No  ? Sexual activity: Not on file  ?Other Topics Concern  ? Not on file  ?Social History  Narrative  ? Not on file  ? ?Social Determinants of Health  ? ?Financial Resource Strain: Not on file  ?Food Insecurity: Not on file  ?Transportation Needs: Not on file  ?Physical Activity: Not on file  ?Stress: Not on file  ?Social Connections: Not on file  ?Intimate Partner Violence: Not on file  ? ? ?FAMILY HISTORY: ?Family History  ?Problem Relation Age of Onset  ? Breast cancer Neg Hx   ? CAD Neg Hx   ? ? ?ALLERGIES:  is allergic to latex. ? ?MEDICATIONS:  ?Current Facility-Administered Medications  ?Medication Dose Route Frequency Provider Last Rate Last Admin  ? acetaminophen (TYLENOL) tablet 650 mg  650 mg Oral Q6H PRN Marcelyn Bruins, MD   650 mg at 12/27/21 1851  ? Or  ? acetaminophen (TYLENOL) suppository 650 mg  650 mg Rectal Q6H PRN Marcelyn Bruins, MD      ? dexamethasone (DECADRON) tablet 4 mg  4 mg Oral BID WC Marcelyn Bruins, MD   4 mg at 12/28/21 1021  ? dicyclomine (BENTYL) capsule 10 mg  10 mg Oral BID PRN Marcelyn Bruins, MD   10 mg at 12/27/21 0539  ? diphenhydrAMINE (BENADRYL) capsule 25 mg  25 mg Oral Once Nita Sells, MD      ? fluconazole (DIFLUCAN) tablet 100 mg  100 mg Oral Daily Marcelyn Bruins, MD   100 mg at 12/28/21 1021  ? lactated ringers infusion  Intravenous Continuous Aline August, MD 125 mL/hr at 12/28/21 0547 New Bag at 12/28/21 0547  ? levETIRAcetam (KEPPRA) tablet 500 mg  500 mg Oral BID Marcelyn Bruins, MD   500 mg at 12/28/21 1021  ? ondansetron (ZOFRAN) tablet 4 mg  4 mg Oral Q6H PRN Marcelyn Bruins, MD      ? Or  ? ondansetron [facility]) injection 4 mg  4 mg Intravenous Q6H PRN Marcelyn Bruins, MD   4 mg at 12/27/21 0539  ? piperacillin-tazobactam (ZOSYN) IVPB 3.375 g  3.375 g Intravenous Q8H Emiliano Dyer, RPH 12.5 mL/hr at 12/28/21 1024 3.375 g at 12/28/21 1024  ? polyethylene glycol (MIRALAX / GLYCOLAX) packet 17 g  17 g Oral Daily PRN Marcelyn Bruins, MD      ? sodium chloride flush (NS) 0.9 % injection 3 mL  3 mL  Intravenous Q12H Marcelyn Bruins, MD   3 mL at 12/27/21 2214  ? vancomycin (VANCOCIN) IVPB 1000 mg/200 mL premix  1,000 mg Intravenous Q24H Emiliano Dyer, RPH 200 mL/hr at 12/28/21 0157 1,000 mg at 12/28/21 0157  ? ? ?REVIEW OF SYSTEMS:   ?Constitutional: Denies fevers, chills or abnormal weight loss ?Eyes: Denies blurriness of vision ?Ears, nose, mouth, throat, and face: Denies mucositis or sore throat ?Respiratory: Denies cough, dyspnea or wheezes ?Cardiovascular: Denies palpitation, chest discomfort or lower extremity swelling ?Gastrointestinal:  Denies nausea, constipation, diarrhea ?GU: Denies dysuria or incontinence ?Skin: Denies abnormal skin rashes ?Neurological: Per HPI ?Musculoskeletal: Denies joint pain, back or neck discomfort. No decrease in ROM ?Behavioral/Psych: Denies anxiety, disturbance in thought content, and mood instability ? ? ?PHYSICAL EXAMINATION: ?Vitals:  ? 12/28/21 0546 12/28/21 1405  ?BP: 135/78 130/74  ?Pulse: (!) 48 (!) 52  ?Resp:  18  ?Temp: 97.8 ?F (36.6 ?C) 98.1 ?F (36.7 ?C)  ?SpO2: 99% 98%  ? ?KPS: 60. ?General: Alert, cooperative, pleasant, in no acute distress ?Head: Craniotomy scar noted, dry and intact. ?EENT: No conjunctival injection or scleral icterus. Oral mucosa moist ?Lungs: Resp effort normal ?Cardiac: Regular rate and rhythm ?Abdomen: Soft, non-distended abdomen ?Skin: petechial rash noted ?Extremities: No clubbing or edema ? ?NEUROLOGIC EXAM: ?Mental Status: Awake, alert, attentive to examiner. Oriented to self and environment. Language is fluent with intact comprehension.  Psychomotor slowing noted. ?Cranial Nerves: Visual acuity is grossly normal. Visual fields are full. Extra-ocular movements intact. No ptosis. Face is symmetric, tongue midline. ?Motor: Tone and bulk are normal. Power is 4/5 in left arm and leg. Reflexes are symmetric, no pathologic reflexes present. Intact finger to nose bilaterally ?Sensory: Intact to light touch and temperature ?Gait:  Deferred 2/2 hemiparesis ? ?LABORATORY DATA:  ?I have reviewed the data as listed ?Lab Results  ?Component Value Date  ? WBC 1.7 (L) 12/28/2021  ? HGB 10.5 (L) 12/28/2021  ? HCT 29.7 (L) 12/28/2021  ? MCV 92.5 12/28/2021  ? PLT 43 (L) 12/28/2021  ? ?Recent Labs  ?  10/28/21 ?0406 11/12/21 ?1137 12/26/21 ?2058 12/27/21 ?0559 12/28/21 ?0416  ?NA 135   < > 132* 134* 135  ?K 3.6   < > 4.2 3.8 3.5  ?CL 100   < > 99 104 100  ?CO2 25   < > '26 24 26  '$ ?GLUCOSE 104*   < > 197* 135* 151*  ?BUN 12   < > 23* 15 16  ?CREATININE 0.51   < > 0.41* 0.34* 0.50  ?CALCIUM 8.7*   < > 7.9* 8.1* 8.3*  ?GFRNONAA >60   < > >  60 >60 >60  ?PROT 6.6  6.7   < > 5.2* 4.7* 4.9*  ?ALBUMIN 3.6  3.8   < > 2.9* 2.6* 2.6*  ?AST 15  15   < > '25 21 22  '$ ?ALT 21  23   < > 80* 69* 66*  ?ALKPHOS 81  79   < > 53 41 40  ?BILITOT 0.5  0.2*   < > 0.6 0.5 0.8  ?BILIDIR <0.1  --   --   --   --   ?IBILI NOT CALCULATED  --   --   --   --   ? < > = values in this interval not displayed.  ? ? ?RADIOGRAPHIC STUDIES: ?I have personally reviewed the radiological images as listed and agreed with the findings in the report. ?CT Head Wo Contrast ? ?Result Date: 12/26/2021 ?CLINICAL DATA:  Altered mental status EXAM: CT HEAD WITHOUT CONTRAST TECHNIQUE: Contiguous axial images were obtained from the base of the skull through the vertex without intravenous contrast. RADIATION DOSE REDUCTION: This exam was performed according to the departmental dose-optimization program which includes automated exposure control, adjustment of the mA and/or kV according to patient size and/or use of iterative reconstruction technique. COMPARISON:  None Available. FINDINGS: Brain: Right temporal lobe encephalomalacia at site of prior tumor resection. There is hyperdensity near the right cerebral peduncle corresponding to previously demonstrated area of tumor. No acute hemorrhage or extra-axial collection. Generalized volume loss with periventricular white matter changes. Vascular: No abnormal  hyperdensity of the major intracranial arteries or dural venous sinuses. No intracranial atherosclerosis. Skull: Remote right craniotomy. Sinuses/Orbits: No fluid levels or advanced mucosal thickening of the visualize

## 2021-12-28 NOTE — TOC Initial Note (Signed)
Transition of Care (TOC) - Initial/Assessment Note  ? ? ?Patient Details  ?Name: Patricia Stevenson ?MRN: [id number] ?Date of Birth: [birth year] ? ?Transition of Care [facility]) CM/SW Contact:    ?Patricia Phi, RN ?Phone Number: ?12/28/2021, 3:01 PM ? ?Clinical Narrative: Spoke to Patricia Stevenson (dtr) about d/c plans-d/c plan home. PT/OT recc HH-informed her of [place] [facility] will try to get [facility] agency to accept insurance;MC recc-HHPT/OT/aide/CSW-informed that aide service beyond [facility] intermittent services can be obtained through private duty since not covered by insurance for daily or hourly asst w/ADL's(out of pocket cost) can google private duty care;dme recc-w/c-dtr will get on own already has hospital bed/3n1. Will need PTAR @ d/c-address confirmed.               ? ? ?Expected Discharge Plan: [place] ?Barriers to Discharge: Continued Medical Work up ? ? ?Patient Goals and CMS Choice ?Patient states their goals for this hospitalization and ongoing recovery are:: Home ?CMS Medicare.gov Compare Post Acute Care list provided to:: Patient Represenative (must comment) (Patricia Stevenson dtr) ?Choice offered to / list presented to : Adult Children ? ?Expected Discharge Plan and Services ?Expected Discharge Plan: [place] ?  ?Discharge Planning Services: CM Consult ?Post Acute Care Choice: Home Health ?Living arrangements for the past 2 months: [place] ?                ?  ?  ?  ?  ?  ?  ?  ?  ?  ?  ? ?Prior Living Arrangements/Services ?Living arrangements for the past 2 months: [place] ?Lives with:: Spouse ?Patient language and need for interpreter reviewed:: Yes ?Do you feel safe going back to the place where you live?: Yes      ?Need for Family Participation in Patient Care: Yes (Comment) ?Care giver support system in place?: Yes (comment) ?Current home services: DME (hospital bed,3n1) ?Criminal Activity/Legal Involvement Pertinent to Current Situation/Hospitalization: No - Comment as  needed ? ?Activities of Daily Living ?Home Assistive Devices/Equipment: Gilford Rile (specify type) ?ADL Screening (condition at time of admission) ?Patient's cognitive ability adequate to safely complete daily activities?: Yes ?Is the patient deaf or have difficulty hearing?: No ?Does the patient have difficulty seeing, even when wearing glasses/contacts?: No ?Does the patient have difficulty concentrating, remembering, or making decisions?: No ?Patient able to express need for assistance with ADLs?: Yes ?Does the patient have difficulty dressing or bathing?: No ?Independently performs ADLs?: No ?Does the patient have difficulty walking or climbing stairs?: No ?Weakness of Legs: Both ?Weakness of Arms/Hands: None ? ?Permission Sought/Granted ?Permission sought to share information with : Case Manager ?Permission granted to share information with : Yes, Verbal Permission Granted ? Share Information with NAME: Case manager ?   ?   ?   ? ?Emotional Assessment ?Appearance:: Appears stated age ?Attitude/Demeanor/Rapport: Gracious ?Affect (typically observed): Accepting ?Orientation: : Oriented to Self, Oriented to Place, Oriented to  Time, Oriented to Situation ?Alcohol / Substance Use: Not Applicable ?Psych Involvement: No (comment) ? ?Admission diagnosis:  Lactic acidosis [E87.20] ?SIRS (systemic inflammatory response syndrome) (HCC) [R65.10] ?Hypothermia, initial encounter [T68.XXXA] ?Neutropenia, unspecified type ([place]) [D70.9] ?Sepsis, due to unspecified organism, unspecified whether acute organ dysfunction present ([place]) [A41.9] ?Patient Active Problem List  ? Diagnosis Date Noted  ? SIRS (systemic inflammatory response syndrome) ([place]) 12/26/2021  ? Left-sided weakness 10/27/2021  ? Paroxysmal atrial fibrillation ([place]) 10/27/2021  ? Atrial fibrillation with RVR ([place]) 09/16/2021  ? Hypertension 09/16/2021  ?  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  Vitamin D deficiency 05/23/2020  ? AMENORRHEA 03/13/2009  ? ?PCP:  Patricia Stevenson, Patricia Halsted, MD ?Pharmacy:   ?[facility], [address] ?[place] ?[place] [address] ?Phone: [phone] Fax: [phone] ? ?[facility] [address] ?[place] [address] ?Phone: [phone] Fax: [phone] ? ?[place] ?[place] [address] ?Phone: [phone] Fax: [phone] ? ? ? ? ?Social Determinants of Health (SDOH) Interventions ?  ? ?Readmission Risk Interventions ?   ? View : No data to display.  ?  ?  ?  ? ? ? ?

## 2021-12-28 NOTE — Progress Notes (Addendum)
HEMATOLOGY-ONCOLOGY PROGRESS NOTE ? ?ASSESSMENT AND PLAN: ?61 year old lady with recently diagnosed high-grade glioma, status post surgery in January 2023, finished concurrent chemoradiation on April 21st 2023.  ?  ?Generalized weakness, transient hypotension and hypothermia, concerning for sepsis ?Worsening pancytopenia, secondary to chemo, versus infection or other etiology ?High-grade glioma, finished concurrent chemoradiation on April 21, last dose Temodar held since December 08, 2021 due to thrombocytopenia. ?Hypertension ?Atrial fibrillation ?  ?Recommendations: ?-Additional labs have been reviewed.  She has no evidence of hemolysis based on LDH and reticulocyte count.  Haptoglobin is still pending.  Her immature fraction is normal.  DIC panel showed a mildly low fibrinogen but overall not consistent with DIC.  No schistocytes seen on peripheral blood smear. ?-I think her pancytopenia is most likely related to Temodar, although this is much worse than the general cytopenias received from low-dose Temodar with concurrent radiation ?-Temodar can cause MDS or leukemia, usually in much late phase, I will hold on bone marrow biopsy for now ?-We will start Granix when ANC below 1.5k.  Her ANC is 1.5 today and will hold off on Granix for now. ?-Continue supportive care with blood transfusion, if hemoglobin less than 7.5, or platelet less than 20K ? ?Mikey Bussing, DNP, AGPCNP-BC, AOCNP ? ?SUBJECTIVE: The patient is alert, but does not talk much.  She does smile and acknowledge able in the room with her.  Her sister is at the bedside.  Also discussed with nursing.  No bleeding has been reported this morning.  She is not having any fevers. ? ?Oncology History  ?High grade glioma not classifiable by WHO criteria ([place])  ?09/15/2021 Initial Diagnosis  ? High grade glioma not classifiable by WHO criteria ([place]) ? ?  ?09/15/2021 Cancer Staging  ? Staging form: Brain and Spinal Cord, AJCC Version 9 ?- Clinical stage from  09/15/2021: No stage assigned - Signed by Ventura Sellers, MD on 10/08/2021 ?Histopathologic type: Astrocytoma, NOS ?Stage prefix: Initial diagnosis ? ?  ?10/19/2021 -  Chemotherapy  ? Patient is on Treatment Plan : BRAIN GLIOBLASTOMA Radiation Therapy With Concurrent Temozolomide 75 mg/m2 Daily Followed By Sequential Maintenance Temozolomide x 6-12 cycles  ? ?  ?  ? ? ? ?REVIEW OF SYSTEMS:   ?Review of Systems  ?Reason unable to perform ROS: Unable to obtain a comprehensive review of systems, pertinent positives/negatives noted below.  ?Constitutional:  Negative for fever.  ?Endo/Heme/Allergies:   ?     No bleeding  ? ?I have reviewed the past medical history, past surgical history, social history and family history with the patient and they are unchanged from previous note. ? ? ?PHYSICAL EXAMINATION: ?ECOG PERFORMANCE STATUS: 3 - Symptomatic, >50% confined to bed ? ?Vitals:  ? 12/28/21 0153 12/28/21 0546  ?BP: 114/67 135/78  ?Pulse: (!) 52 (!) 48  ?Resp: 16   ?Temp: 98.1 ?F (36.7 ?C) 97.8 ?F (36.6 ?C)  ?SpO2: 97% 99%  ? ?Filed Weights  ? 12/27/21 1538  ?Weight: 53.8 kg  ? ? ?Intake/Output from previous day: ?05/07 0701 - 05/08 0700 ?In: 1929.5 [P.O.:240; I.V.:829.6; Blood:274; IV Piggyback:585.9] ?Out: 850 [Urine:850] ? ?Physical Exam ?Vitals reviewed.  ?Constitutional:   ?   General: She is not in acute distress. ?Eyes:  ?   General: No scleral icterus. ?   Conjunctiva/sclera: Conjunctivae normal.  ?Cardiovascular:  ?   Rate and Rhythm: Normal rate and regular rhythm.  ?Pulmonary:  ?   Effort: Pulmonary effort is normal. No respiratory distress.  ?   Breath sounds:  Normal breath sounds.  ?Abdominal:  ?   Palpations: Abdomen is soft.  ?   Tenderness: There is no abdominal tenderness.  ?Skin: ?   General: Skin is warm and dry.  ?Neurological:  ?   Mental Status: She is alert. Mental status is at baseline.  ? ? ?LABORATORY DATA:  ?I have reviewed the data as listed ? ?  Latest Ref Rng & Units 12/28/2021  ?  4:16 AM  12/27/2021  ?  5:59 AM 12/26/2021  ?  8:58 PM  ?CMP  ?Glucose 70 - 99 mg/dL 151   135   197    ?BUN 6 - 20 mg/dL '16   15   23    ' ?Creatinine 0.44 - 1.00 mg/dL 0.50   0.34   0.41    ?Sodium 135 - 145 mmol/L 135   134   132    ?Potassium 3.5 - 5.1 mmol/L 3.5   3.8   4.2    ?Chloride 98 - 111 mmol/L 100   104   99    ?CO2 22 - 32 mmol/L '26   24   26    ' ?Calcium 8.9 - 10.3 mg/dL 8.3   8.1   7.9    ?Total Protein 6.5 - 8.1 g/dL 4.9   4.7   5.2    ?Total Bilirubin 0.3 - 1.2 mg/dL 0.8   0.5   0.6    ?Alkaline Phos 38 - 126 U/L 40   41   53    ?AST 15 - 41 U/L '22   21   25    ' ?ALT 0 - 44 U/L 66   69   80    ? ? ?Lab Results  ?Component Value Date  ? WBC 1.7 (L) 12/28/2021  ? HGB 10.5 (L) 12/28/2021  ? HCT 29.7 (L) 12/28/2021  ? MCV 92.5 12/28/2021  ? PLT 43 (L) 12/28/2021  ? NEUTROABS 1.5 (L) 12/28/2021  ? ? ?No results found for: CEA1, CEA, [id number], CA125, PSA1 ? ?CT Head Wo Contrast ? ?Result Date: 12/26/2021 ?CLINICAL DATA:  Altered mental status EXAM: CT HEAD WITHOUT CONTRAST TECHNIQUE: Contiguous axial images were obtained from the base of the skull through the vertex without intravenous contrast. RADIATION DOSE REDUCTION: This exam was performed according to the departmental dose-optimization program which includes automated exposure control, adjustment of the mA and/or kV according to patient size and/or use of iterative reconstruction technique. COMPARISON:  None Available. FINDINGS: Brain: Right temporal lobe encephalomalacia at site of prior tumor resection. There is hyperdensity near the right cerebral peduncle corresponding to previously demonstrated area of tumor. No acute hemorrhage or extra-axial collection. Generalized volume loss with periventricular white matter changes. Vascular: No abnormal hyperdensity of the major intracranial arteries or dural venous sinuses. No intracranial atherosclerosis. Skull: Remote right craniotomy. Sinuses/Orbits: No fluid levels or advanced mucosal thickening of the visualized  paranasal sinuses. No mastoid or middle ear effusion. The orbits are normal. IMPRESSION: 1. No acute intracranial abnormality. 2. Right temporal lobe encephalomalacia at site of prior tumor resection. Electronically Signed   By: Ulyses Jarred M.D.   On: 12/26/2021 23:07  ? ?CT Angio Chest PE W and/or Wo Contrast ? ?Result Date: 12/16/2021 ?CLINICAL DATA:  Shortness of breath and left lower extremity edema. Brain tumor. EXAM: CT ANGIOGRAPHY CHEST WITH CONTRAST TECHNIQUE: Multidetector CT imaging of the chest was performed using the standard protocol during bolus administration of intravenous contrast. Multiplanar CT image reconstructions and MIPs were obtained to evaluate  the vascular anatomy. RADIATION DOSE REDUCTION: This exam was performed according to the departmental dose-optimization program which includes automated exposure control, adjustment of the mA and/or kV according to patient size and/or use of iterative reconstruction technique. CONTRAST:  22m OMNIPAQUE IOHEXOL 350 MG/ML SOLN COMPARISON:  None. FINDINGS: Cardiovascular: Negative for pulmonary embolus. Atherosclerotic calcification of the aorta. Enlarged pulmonic trunk. Heart is at the upper limits of normal in size. No pericardial effusion. Mediastinum/Nodes: No pathologically enlarged mediastinal, hilar or axillary lymph nodes. Esophagus is grossly unremarkable. Lungs/Pleura: Ground-glass is seen in both lower lobes and may be due to slight expiratory phase imaging. No dense airspace consolidation. No pleural fluid. Airway is unremarkable. Upper Abdomen: Visualized portions of the liver, adrenal glands, kidneys, spleen, pancreas, stomach and bowel are grossly unremarkable. Musculoskeletal: Degenerative changes in the spine. No worrisome lytic or sclerotic lesions. Review of the MIP images confirms the above findings. IMPRESSION: 1. Negative for pulmonary embolus. 2. No definitive findings to explain the patient's clinical history. 3.  Aortic  atherosclerosis (ICD10-I70.0). 4. Enlarged pulmonic trunk, indicative of pulmonary arterial hypertension. Electronically Signed   By: MLorin PicketM.D.   On: 12/16/2021 16:10  ? ?MR BRAIN W WO CONTRAST ? ?Result Date: 5/7

## 2021-12-28 NOTE — Progress Notes (Incomplete)
Introduction: ?Patricia Stevenson, 61 y.o F presenting  on 5/6 with CC of worsening generalized weakness. ? ?History of present illness: ?Current dx of brain cancer, HTN, hyperlipidemia, GERD, afib ? ?Past medical history/allergies/family history pertinent to current illness: ? ?PTA meds: ? ?Social hx: ? ?Physical exam/relevant labs, diagnostic testing/imaging: ?Temp of 95.9 on admission. Had an episode of hypotension in the ED. Temp up to 97.8 on 05/08.  ?Positive for GI pain, all other ROS - ?Mild weakness in lower left extremity ?Labs ordered were notable for low platelets, WBCs, RBCs, HCT, neutro abs and all of these remain low as pt is on chemo.  ?Also notable is high blood sugar, low calcium and low albumin.  ?CrCl calculated to be 55.1 and Scr is 0.5 mg/dL.  ?Suspected infection ?Bcx and Ucx havent shown any growth and she was - for MRSA via nairs PCR ? ?Problem based assessment and plan: ? ?- Problem 1: ? ?A. Assessment: ? ?B. Plan: ? ?- Problem 2: ? ?A. Assessment: ? ?B. Plan: ? ?- Problem 3: ? ?A. Assessment: ? ?B. Plan:  ?

## 2021-12-28 NOTE — Evaluation (Signed)
Occupational Therapy Evaluation ?Patient Details ?Name: Patricia Stevenson ?MRN: [id number] ?DOB: 1961/05/29 ?Today's Date: 12/28/2021 ? ? ?History of Present Illness 61 yo female admitted with SIRS. She is currently undergoing chemo-radiation for brain cancer. Per family, has had a pretty quick decline over last week or so prior to this admission. Hx of craniotomy 08/2021, residual L sided weakness,  lap chole 10/22, Afib, glioblastoma, falls,  ? ?Clinical Impression ?  ?Patient is a 61 year old female who was admitted for above. Patient lives at home with family support. Patient was limited during session with pain and fatigue levels during session. Patient was max A x 2 for movements with mod A to roll to each side in bed. Patients family plan for patient to come back home as long at she is at a manageable level. Patient would continue to benefit from skilled OT services at this time while admitted and after d/c to address noted deficits in order to improve overall safety and independence in ADLs.  ?  ?   ? ?Recommendations for follow up therapy are one component of a multi-disciplinary discharge planning process, led by the attending physician.  Recommendations may be updated based on patient status, additional functional criteria and insurance authorization.  ? ?Follow Up Recommendations ? Home health OT  ?  ?Assistance Recommended at Discharge Frequent or constant Supervision/Assistance  ?Patient can return home with the following A lot of help with walking and/or transfers;A lot of help with bathing/dressing/bathroom;Assistance with cooking/housework;Direct supervision/assist for financial management;Assist for transportation;Help with stairs or ramp for entrance;Direct supervision/assist for medications management ? ?  ?Functional Status Assessment ? Patient has had a recent decline in their functional status and demonstrates the ability to make significant improvements in function in a reasonable and predictable  amount of time.  ?Equipment Recommendations ? Other (comment);BSC/3in1  ?  ?Recommendations for Other Services   ? ? ?  ?Precautions / Restrictions Precautions ?Precautions: Fall ?Precaution Comments: incontinent ?Restrictions ?Weight Bearing Restrictions: No  ? ?  ? ?Mobility Bed Mobility ?Overal bed mobility: Needs Assistance ?Bed Mobility: Supine to Sit, Sit to Supine, Rolling ?Rolling: Mod assist ?  ?Supine to sit: +2 for safety/equipment, Mod assist, +2 for physical assistance ?Sit to supine: Max assist, +2 for physical assistance, +2 for safety/equipment ?  ?General bed mobility comments: Assist for trunk and bil LEs. Utilized bedpad for scooting, positiong. Increased time. Multimodal cueing provided. Pt fatigues very quickly. ?  ? ?Transfers ?  ?  ?  ?  ?  ?  ?  ?  ?  ?  ?  ? ?  ?Balance Overall balance assessment: Needs assistance, History of Falls ?Sitting-balance support: Bilateral upper extremity supported, Feet supported ?Sitting balance-Leahy Scale: Fair ?Sitting balance - Comments: Sat with Min guard assist for at least 5 minutes before fatiguing and wanting to return to bed ?  ?  ?  ?  ?  ?  ?  ?  ?  ?  ?  ?  ?  ?  ?  ?   ? ?ADL either performed or assessed with clinical judgement  ? ?ADL Overall ADL's : Needs assistance/impaired ?Eating/Feeding: Minimal assistance;Bed level ?  ?Grooming: Dance movement psychotherapist;Bed level;Minimal assistance ?  ?Upper Body Bathing: Bed level;Moderate assistance ?  ?Lower Body Bathing: Bed level;Total assistance ?  ?Upper Body Dressing : Bed level;Moderate assistance ?  ?Lower Body Dressing: Bed level;Total assistance ?  ?Toilet Transfer: +2 for physical assistance;+2 for safety/equipment ?Toilet Transfer Details (indicate cue type  and reason): patient was able to sit on edge of bed with min guard but when asked to attempt to stand even with family encouiragemnet who were in room. patient declined to attempt at this time ?Toileting- Clothing Manipulation and Hygiene: Total  assistance;Bed level ?Toileting - Clothing Manipulation Details (indicate cue type and reason): rolling with mod A for hygiene in bed with fecal incontinence noted. ?  ?  ?  ?   ? ? ? ?Vision Patient Visual Report: No change from baseline ?   ?   ?Perception   ?  ?Praxis   ?  ? ?Pertinent Vitals/Pain Pain Assessment ?Pain Assessment: Faces ?Faces Pain Scale: Hurts even more ?Pain Location: "all over" ?Pain Descriptors / Indicators: Discomfort, Grimacing, Sore ?Pain Intervention(s): Limited activity within patient's tolerance, Monitored during session, Repositioned  ? ? ? ?Hand Dominance Right ?  ?Extremity/Trunk Assessment Upper Extremity Assessment ?Upper Extremity Assessment: Generalized weakness (patient's shoulder ROM WFL, strength 3+/5) ?  ?Lower Extremity Assessment ?Lower Extremity Assessment: Defer to PT evaluation ?  ?Cervical / Trunk Assessment ?Cervical / Trunk Assessment: Normal ?  ?Communication Communication ?Communication: Prefers language other than English (spanish) ?  ?Cognition Arousal/Alertness: Awake/alert, Lethargic ?Behavior During Therapy: Flat affect, WFL for tasks assessed/performed ?  ?Area of Impairment: Following commands, Problem solving ?  ?  ?  ?  ?  ?  ?  ?  ?  ?  ?  ?Following Commands: Follows one step commands inconsistently ?  ?  ?Problem Solving: Slow processing, Decreased initiation, Difficulty sequencing, Requires verbal cues, Requires tactile cues ?  ?  ?  ?General Comments    ? ?  ?Exercises   ?  ?Shoulder Instructions    ? ? ?Home Living Family/patient expects to be discharged to:: Private residence ?Living Arrangements: Spouse/significant other;Children;Other relatives ?Available Help at Discharge: Family;Available 24 hours/day ?Type of Home: Apartment ?Home Access: Stairs to enter ?Entrance Stairs-Number of Steps: 1 flight ?Entrance Stairs-Rails: Left ?Home Layout: One level ?  ?  ?Bathroom Shower/Tub: Tub/shower unit ?  ?Bathroom Toilet: Standard ?Bathroom Accessibility:  No ?  ?Home Equipment: Rolling Walker (2 wheels);BSC/3in1 ?  ?  ?  ? ?  ?Prior Functioning/Environment Prior Level of Function : Needs assist ?  ?  ?  ?  ?  ?  ?Mobility Comments: was ambulatory with RW and able to negotiate stairs with assistance up until about a week prior to this admission ?ADLs Comments: some assistance with bathing/dressing up until about a week prior to this admissoin. ?  ? ?  ?  ?OT Problem List: Decreased activity tolerance;Impaired balance (sitting and/or standing);Decreased safety awareness;Decreased strength;Decreased knowledge of precautions;Decreased knowledge of use of DME or AE;Pain ?  ?   ?OT Treatment/Interventions: Self-care/ADL training;Therapeutic exercise;Neuromuscular education;Energy conservation;DME and/or AE instruction;Therapeutic activities;Balance training;Patient/family education  ?  ?OT Goals(Current goals can be found in the care plan section) Acute Rehab OT Goals ?Patient Stated Goal: to get back in bed ?OT Goal Formulation: With patient/family ?Time For Goal Achievement: 01/11/22 ?Potential to Achieve Goals: Fair  ?OT Frequency: Min 2X/week ?  ? ?Co-evaluation PT/OT/SLP Co-Evaluation/Treatment: Yes ?Reason for Co-Treatment: For patient/therapist safety;To address functional/ADL transfers ?PT goals addressed during session: Mobility/safety with mobility ?OT goals addressed during session: ADL's and self-care ?  ? ?  ?AM-PAC OT "6 Clicks" Daily Activity     ?Outcome Measure Help from another person eating meals?: A Little ?Help from another person taking care of personal grooming?: A Little ?Help from another person toileting, which includes  using toliet, bedpan, or urinal?: A Lot ?Help from another person bathing (including washing, rinsing, drying)?: A Lot ?Help from another person to put on and taking off regular upper body clothing?: A Lot ?Help from another person to put on and taking off regular lower body clothing?: A Lot ?6 Click Score: 14 ?  ?End of Session    ? ?Activity Tolerance: Patient limited by fatigue;Patient limited by lethargy ?Patient left: in bed;with call bell/phone within reach;with family/visitor present ? ?OT Visit Diagnosis: Muscle weakness (general

## 2021-12-28 NOTE — Progress Notes (Signed)
?PROGRESS NOTE ? ? ? ?Patricia Stevenson  ALP:[id number] DOB: August 12, 1961 DOA: 12/26/2021 ?PCP: Isaac Bliss, Rayford Halsted, MD  ? ?Brief Narrative:  ?61 y.o. female with medical history significant of high-grade glioma status postsurgical resection with recent completion of concurrent chemoradiation with Temodar, hypertension, hyperlipidemia, GERD, paroxysmal atrial fibrillation presented with generalized weakness.  On presentation, she was found to be pancytopenic with elevated lactic acid level, hypothermia and bradycardia.  CT of the head showed no acute changes but right temporal lobe encephalomalacia.  CT of the abdomen and pelvis showed no acute abnormality except for moderate stool burden.  She was started on broad-spectrum antibiotics. ? ?Assessment & Plan: ?  ?Presumed severe sepsis: Present on admission with ?Neutropenic fever ?-Presented with neutropenia, hypotension, hypothermia, bradycardia and elevated lactic acidosis.  Possibly has neutropenic fever.  No other source identified including normal CT of the abdomen and pelvis. ?-Currently on Zosyn, vancomycin and oral Diflucan.  No temperature spikes over the last 24 hours.  Hemodynamically stable. ?-Blood cultures negative so far. ? ?High-grade glioma status postsurgical resection with recent completion of concurrent chemoradiation with Temodar ?Pancytopenia ?-Oncology following.  Status post 1 unit platelets transfusion on 12/27/2021. ?-Transfuse if hemoglobin is less than 7.5 and platelets is less than 20 ?-ANC 1.5 today.  Follow oncology recommendations regarding Granix ?-Temodar on hold ?-Continue Keppra and Decadron ? ?Bradycardia ?-Possibly from Cardizem use.  DC Cardizem ? ?Paroxysmal A-fib ?-Developed during last hospitalization.  Currently on anticoagulation.  Cardizem plan as above.  Outpatient follow-up with cardiology ? ?AKI ruled out ? ?Transaminitis ?-Only ALT slightly elevated.  Monitor intermittently. ? ?Hyponatremia ?-Resolved ? ?Physical  deconditioning-PT/OT eval ? ?DVT prophylaxis: SCDs ?Code Status: Full ?Family Communication: None at bedside ?Disposition Plan: ?Status is: Inpatient ?Remains inpatient appropriate because: Of severity of illness ? ? ? ?Consultants: Oncology; neurooncology official evaluation is pending ? ?Procedures: None ? ?Antimicrobials:  ?Anti-infectives (From admission, onward)  ? ? Start     Dose/Rate Route Frequency Ordered Stop  ? 12/28/21 0200  vancomycin (VANCOCIN) IVPB 1000 mg/200 mL premix       ? 1,000 mg ?200 mL/hr over 60 Minutes Intravenous Every 24 hours 12/27/21 0723    ? 12/27/21 1000  fluconazole (DIFLUCAN) tablet 100 mg       ?Note to Pharmacy: Take 2 tablets on day 1, then 1 tablet daily    ? 100 mg Oral Daily 12/26/21 2351    ? 12/27/21 0800  piperacillin-tazobactam (ZOSYN) IVPB 3.375 g       ? 3.375 g ?12.5 mL/hr over 240 Minutes Intravenous Every 8 hours 12/27/21 0723    ? 12/27/21 0000  vancomycin (VANCOCIN) IVPB 1000 mg/200 mL premix       ? 1,000 mg ?200 mL/hr over 60 Minutes Intravenous  Once 12/26/21 2352 12/27/21 0314  ? 12/27/21 0000  piperacillin-tazobactam (ZOSYN) IVPB 3.375 g       ? 3.375 g ?100 mL/hr over 30 Minutes Intravenous  Once 12/26/21 2352 12/27/21 0134  ? ?  ? ? ? ?Subjective: ?Patient seen and examined at bedside.  Feels weak.  Poor historian.  Denies any current abdominal pain, nausea, vomiting. ? ?Objective: ?Vitals:  ? 12/27/21 2045 12/28/21 0153 12/28/21 0546 12/28/21 1405  ?BP: 107/69 114/67 135/78 130/74  ?Pulse: (!) 51 (!) 52 (!) 48 (!) 52  ?Resp: '19 16  18  '$ ?Temp: 98.2 ?F (36.8 ?C) 98.1 ?F (36.7 ?C) 97.8 ?F (36.6 ?C) 98.1 ?F (36.7 ?C)  ?TempSrc: Oral Oral Oral Oral  ?  SpO2: 98% 97% 99% 98%  ?Weight:      ?Height:      ? ? ?Intake/Output Summary (Last 24 hours) at 12/28/2021 1416 ?Last data filed at 12/28/2021 0745 ?Gross per 24 hour  ?Intake 1989.47 ml  ?Output 850 ml  ?Net 1139.47 ml  ? ?Filed Weights  ? 12/27/21 1538  ?Weight: 53.8 kg  ? ? ?Examination: ? ?General exam: Appears  calm and comfortable.  Looks chronically ill and deconditioned.  Currently on room air. ?Respiratory system: Bilateral decreased breath sounds at bases ?Cardiovascular system: S1 & S2 heard, bradycardic ?Gastrointestinal system: Abdomen is nondistended, soft and nontender. Normal bowel sounds heard. ?Extremities: No cyanosis, clubbing, edema  ?Central nervous system: Alert and oriented. No focal neurological deficits. Moving extremities ?Skin: No rashes, lesions or ulcers ?Psychiatry: Flat affect.  Currently not agitated. ? ? ?Data Reviewed: I have personally reviewed following labs and imaging studies ? ?CBC: ?Recent Labs  ?Lab 12/22/21 ?1509 12/26/21 ?2058 12/27/21 ?0559 12/27/21 ?2030 12/28/21 ?0416  ?WBC 2.5* 1.9* 1.8*  --  1.7*  ?NEUTROABS 2.2 1.7 1.6*  --  1.5*  ?HGB 12.9 11.6* 10.8*  --  10.5*  ?HCT 36.2 32.6* 29.9*  --  29.7*  ?MCV 89.8 91.6 92.0  --  92.5  ?PLT 23* 20* 18* 60* 43*  ? ?Basic Metabolic Panel: ?Recent Labs  ?Lab 12/22/21 ?1509 12/26/21 ?2058 12/27/21 ?0559 12/28/21 ?0416  ?NA 131* 132* 134* 135  ?K 4.8 4.2 3.8 3.5  ?CL 98 99 104 100  ?CO2 '27 26 24 26  '$ ?GLUCOSE 124* 197* 135* 151*  ?BUN 24* 23* 15 16  ?CREATININE 0.49 0.41* 0.34* 0.50  ?CALCIUM 9.3 7.9* 8.1* 8.3*  ?MG  --   --   --  2.4  ? ?GFR: ?Estimated Creatinine Clearance: 55.1 mL/min (by C-G formula based on SCr of 0.5 mg/dL). ?Liver Function Tests: ?Recent Labs  ?Lab 12/22/21 ?1509 12/26/21 ?2058 12/27/21 ?0559 12/28/21 ?0416  ?AST '18 25 21 22  '$ ?ALT 73* 80* 69* 66*  ?ALKPHOS 57 53 41 40  ?BILITOT 0.4 0.6 0.5 0.8  ?PROT 6.2* 5.2* 4.7* 4.9*  ?ALBUMIN 3.8 2.9* 2.6* 2.6*  ? ?No results for input(s): LIPASE, AMYLASE in the last 168 hours. ?No results for input(s): AMMONIA in the last 168 hours. ?Coagulation Profile: ?Recent Labs  ?Lab 12/27/21 ?0559 12/27/21 ?2030 12/28/21 ?0416  ?INR 1.0 1.0 1.0  ? ?Cardiac Enzymes: ?No results for input(s): CKTOTAL, CKMB, CKMBINDEX, TROPONINI in the last 168 hours. ?BNP (last 3 results) ?No results for  input(s): PROBNP in the last 8760 hours. ?HbA1C: ?No results for input(s): HGBA1C in the last 72 hours. ?CBG: ?No results for input(s): GLUCAP in the last 168 hours. ?Lipid Profile: ?No results for input(s): CHOL, HDL, LDLCALC, TRIG, CHOLHDL, LDLDIRECT in the last 72 hours. ?Thyroid Function Tests: ?No results for input(s): TSH, T4TOTAL, FREET4, T3FREE, THYROIDAB in the last 72 hours. ?Anemia Panel: ?Recent Labs  ?  12/27/21 ?0559  ?RETICCTPCT 1.1  ? ?Sepsis Labs: ?Recent Labs  ?Lab 12/26/21 ?9147 12/26/21 ?2258 12/27/21 ?0559 12/28/21 ?0416  ?PROCALCITON  --   --  <0.10 <0.10  ?LATICACIDVEN 2.7* 2.4*  --   --   ? ? ?Recent Results (from the past 240 hour(s))  ?Urine Culture     Status: None  ? Collection Time: 12/26/21 12:19 AM  ? Specimen: Urine, Clean Catch  ?Result Value Ref Range Status  ? Specimen Description   Final  ?  URINE, CLEAN CATCH ?Performed at [facility], [place] [address], [place], [place] [address] ?  ? Special Requests   Final  ?  NONE ?Performed at [facility], [place] [address], [place], [place] [address] ?  ? Culture   Final  ?  NO GROWTH ?Performed at [facility], [place] [address] [place], [place] [address] ?  ? Report Status 12/28/2021 FINAL  Final  ?Culture, blood (routine x 2)     Status: None (Preliminary result)  ? Collection Time: 12/26/21  9:05 PM  ? Specimen: BLOOD  ?Result Value Ref Range Status  ? Specimen Description   Final  ?  BLOOD BLOOD RIGHT FOREARM ?Performed at [facility], [place] [address] [place], [place] [address] ?  ? Special Requests   Final  ?  BOTTLES DRAWN AEROBIC AND ANAEROBIC Blood Culture adequate volume ?Performed at [facility], [place] [address] [place], [place] [address] ?  ? Culture   Final  ?  NO GROWTH 1 DAY ?Performed at [facility], [place] [address] [place], [place] [address] ?  ? Report Status PENDING  Incomplete  ?Culture, blood (routine x 2)     Status: None (Preliminary  result)  ? Collection Time: 12/26/21  9:09 PM  ? Specimen: BLOOD  ?Result Value Ref Range Status  ? Specimen Description   Final  ?  BLOOD LEFT ANTECUBITAL ?Performed at [facility], [place]

## 2021-12-28 NOTE — Plan of Care (Signed)

## 2021-12-29 ENCOUNTER — Encounter: Payer: Self-pay | Admitting: Internal Medicine

## 2021-12-29 DIAGNOSIS — I10 Essential (primary) hypertension: Secondary | ICD-10-CM | POA: Diagnosis not present

## 2021-12-29 DIAGNOSIS — E876 Hypokalemia: Secondary | ICD-10-CM

## 2021-12-29 DIAGNOSIS — D709 Neutropenia, unspecified: Secondary | ICD-10-CM

## 2021-12-29 DIAGNOSIS — R5081 Fever presenting with conditions classified elsewhere: Secondary | ICD-10-CM | POA: Diagnosis not present

## 2021-12-29 DIAGNOSIS — D61818 Other pancytopenia: Secondary | ICD-10-CM | POA: Diagnosis not present

## 2021-12-29 DIAGNOSIS — R001 Bradycardia, unspecified: Secondary | ICD-10-CM

## 2021-12-29 DIAGNOSIS — R5381 Other malaise: Secondary | ICD-10-CM

## 2021-12-29 DIAGNOSIS — A419 Sepsis, unspecified organism: Secondary | ICD-10-CM

## 2021-12-29 DIAGNOSIS — R6510 Systemic inflammatory response syndrome (SIRS) of non-infectious origin without acute organ dysfunction: Secondary | ICD-10-CM | POA: Insufficient documentation

## 2021-12-29 DIAGNOSIS — E871 Hypo-osmolality and hyponatremia: Secondary | ICD-10-CM

## 2021-12-29 LAB — COMPREHENSIVE METABOLIC PANEL
ALT: 86 U/L — ABNORMAL HIGH (ref 0–44)
AST: 26 U/L (ref 15–41)
Albumin: 2.6 g/dL — ABNORMAL LOW (ref 3.5–5.0)
Alkaline Phosphatase: 44 U/L (ref 38–126)
Anion gap: 9 (ref 5–15)
BUN: 17 mg/dL (ref 6–20)
CO2: 25 mmol/L (ref 22–32)
Calcium: 8.1 mg/dL — ABNORMAL LOW (ref 8.9–10.3)
Chloride: 100 mmol/L (ref 98–111)
Creatinine, Ser: 0.42 mg/dL — ABNORMAL LOW (ref 0.44–1.00)
GFR, Estimated: >60 mL/min (ref >60)
Glucose, Bld: 136 mg/dL — ABNORMAL HIGH (ref 70–99)
Potassium: 3.4 mmol/L — ABNORMAL LOW (ref 3.5–5.1)
Sodium: 134 mmol/L — ABNORMAL LOW (ref 135–145)
Total Bilirubin: 0.5 mg/dL (ref 0.3–1.2)
Total Protein: 4.9 g/dL — ABNORMAL LOW (ref 6.5–8.1)

## 2021-12-29 LAB — MAGNESIUM: Magnesium: 2.3 mg/dL (ref 1.7–2.4)

## 2021-12-29 LAB — HAPTOGLOBIN: Haptoglobin: 224 mg/dL (ref 33–346)

## 2021-12-29 NOTE — Progress Notes (Signed)
? ?                                                                                                                                                          ?  Patient Name: Patricia Stevenson ?MRN: [id number] ?DOB: 11/03/1960 ?Referring Physician: Vallarie Mare ?Date of Service: 12/11/2021 ?[facility], [place] ? ?                                                      End Of Treatment Note ? 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 ?Cancer Staging:  High-grade glioma of the right temporal lobe ? ?Intent: Curative ? ?Radiation Treatment Dates: 11/02/2021 through 12/11/2021 ?Site Technique Total Dose (Gy) Dose per Fx (Gy) Completed Fx Beam Energies  ?Brain: Brain IMRT 46/46 2 23/23 6X  ?Brain: Brain_Bst IMRT 14/14 2 7/7 6X  ? ?Narrative: The patient tolerated radiation therapy relatively well. She developed fatigue and anticipated skin changes in the treatment field. She did have some nausea during therapy and persistent deficits in memory during therapy. ? ?Plan: The patient will receive a call in about one month from the radiation oncology department. She will continue follow up with Dr. Mickeal Skinner as well.  ? ?________________________________________________ ? ? ? ?Carola Rhine, PAC  ?

## 2021-12-29 NOTE — Care Management (Signed)
?  ?  Durable Medical Equipment  ?(From admission, onward)  ?  ? ? ?  ? ?  Start     Ordered  ? 12/29/21 1458  For home use only DME Hospital bed  Once       ?Question Answer Comment  ?Length of Need 6 Months   ?The above medical condition requires: Patient requires the ability to reposition frequently   ?Head must be elevated greater than: 30 degrees   ?Bed type Semi-electric   ?  ? 12/29/21 1458  ? ?  ?  ? ?  ?  ?

## 2021-12-29 NOTE — Progress Notes (Signed)
?PROGRESS NOTE ? ? ? ?Patricia Stevenson  WEX:[id number] DOB: 29-Aug-1960 DOA: 12/26/2021 ?PCP: Isaac Bliss, Rayford Halsted, MD  ? ?Brief Narrative:  ?61 y.o. female with medical history significant of high-grade glioma status postsurgical resection with recent completion of concurrent chemoradiation with Temodar, hypertension, hyperlipidemia, GERD, paroxysmal atrial fibrillation presented with generalized weakness.  On presentation, she was found to be pancytopenic with elevated lactic acid level, hypothermia and bradycardia.  CT of the head showed no acute changes but right temporal lobe encephalomalacia.  CT of the abdomen and pelvis showed no acute abnormality except for moderate stool burden.  She was started on broad-spectrum antibiotics.  Oncology/neurooncology were consulted. ? ?Assessment & Plan: ?  ?Presumed severe sepsis: Present on admission with ?Neutropenic fever ?-Presented with neutropenia, hypotension, hypothermia, bradycardia and elevated lactic acidosis.  Possibly has neutropenic fever.  No other source identified including normal CT of the abdomen and pelvis. ?-Currently on Zosyn.  Blood cultures negative so far.  Vancomycin and Diflucan discontinued on 12/28/2021.  No temperature spikes over the last 48 hours.  Hemodynamically stable. ?-DC IV fluids today.  Might even DC Zosyn later today and monitor ? ?High-grade glioma status postsurgical resection with recent completion of concurrent chemoradiation with Temodar ?Pancytopenia ?-Oncology following.  Status post 1 unit platelets transfusion on 12/27/2021. ?-Transfuse if hemoglobin is less than 7.5 and platelets is less than 20 ?-Labs pending for today. ?-Temodar on hold ?-Continue Keppra and Decadron ? ?Hypokalemia ?-Replace.  Repeat a.m. labs ? ?Hyponatremia ?-Mild.  Repeat a.m. labs ? ?Bradycardia ?-Possibly from Cardizem use.  Off Cardizem.  Still mildly bradycardic. ? ?Paroxysmal A-fib ?-Developed during last hospitalization.  Currently not on  anticoagulation.  Cardizem plan as above.  Outpatient follow-up with cardiology ? ?AKI ruled out ? ?Transaminitis ?-Only ALT slightly elevated.  Monitor intermittently. ? ?Physical deconditioning-PT/OT recommend home health PT/OT ? ?DVT prophylaxis: SCDs ?Code Status: Full ?Family Communication: Sister at bedside ?Disposition Plan: ?Status is: Inpatient ?Remains inpatient appropriate because: Of severity of illness ? ? ? ?Consultants: Oncology; neurooncology  ? ?Procedures: None ? ?Antimicrobials:  ?Anti-infectives (From admission, onward)  ? ? Start     Dose/Rate Route Frequency Ordered Stop  ? 12/28/21 0200  vancomycin (VANCOCIN) IVPB 1000 mg/200 mL premix  Status:  Discontinued       ? 1,000 mg ?200 mL/hr over 60 Minutes Intravenous Every 24 hours 12/27/21 0723 12/28/21 1626  ? 12/27/21 1000  fluconazole (DIFLUCAN) tablet 100 mg  Status:  Discontinued       ?Note to Pharmacy: Take 2 tablets on day 1, then 1 tablet daily    ? 100 mg Oral Daily 12/26/21 2351 12/28/21 1626  ? 12/27/21 0800  piperacillin-tazobactam (ZOSYN) IVPB 3.375 g       ? 3.375 g ?12.5 mL/hr over 240 Minutes Intravenous Every 8 hours 12/27/21 0723    ? 12/27/21 0000  vancomycin (VANCOCIN) IVPB 1000 mg/200 mL premix       ? 1,000 mg ?200 mL/hr over 60 Minutes Intravenous  Once 12/26/21 2352 12/27/21 0314  ? 12/27/21 0000  piperacillin-tazobactam (ZOSYN) IVPB 3.375 g       ? 3.375 g ?100 mL/hr over 30 Minutes Intravenous  Once 12/26/21 2352 12/27/21 0134  ? ?  ? ? ? ?Subjective: ?Patient seen and examined at bedside.  No fever, worsening abdominal pain or vomiting reported.  Still feels very weak but improving with improving appetite.  Sister present at bedside translated for the patient. ? ?Objective: ?Vitals:  ? 12/28/21  9381 12/28/21 1405 12/28/21 1948 12/29/21 0558  ?BP: 135/78 130/74 122/74 (!) 155/79  ?Pulse: (!) 48 (!) 52 60 (!) 51  ?Resp:  '18 17 17  '$ ?Temp: 97.8 ?F (36.6 ?C) 98.1 ?F (36.7 ?C) 97.7 ?F (36.5 ?C) 97.8 ?F (36.6 ?C)  ?TempSrc:  Oral Oral Oral Oral  ?SpO2: 99% 98% 98% 97%  ?Weight:      ?Height:      ? ? ?Intake/Output Summary (Last 24 hours) at 12/29/2021 0734 ?Last data filed at 12/29/2021 0600 ?Gross per 24 hour  ?Intake 212.31 ml  ?Output 2250 ml  ?Net -2037.69 ml  ? ? ?Filed Weights  ? 12/27/21 1538  ?Weight: 53.8 kg  ? ? ?Examination: ? ?General: On room air.  No distress.  Looks chronically ill and deconditioned. ?ENT/neck: No thyromegaly.  JVD is not elevated  ?respiratory: Decreased breath sounds at bases bilaterally with some crackles; no wheezing  ?CVS: S1-S2 heard, bradycardic intermittently  ?abdominal: Soft, nontender, slightly distended; no organomegaly, normal bowel sounds are heard ?Extremities: Trace lower extremity edema; no cyanosis  ?CNS: Awake and alert.  No focal neurologic deficit.  Moves extremities ?Lymph: No obvious lymphadenopathy ?Skin: No obvious ecchymosis/lesions  ?psych: Flat affect.  No signs of agitation.   ?Musculoskeletal: No obvious joint swelling/deformity ? ? ? ?Data Reviewed: I have personally reviewed following labs and imaging studies ? ?CBC: ?Recent Labs  ?Lab 12/22/21 ?1509 12/26/21 ?2058 12/27/21 ?0559 12/27/21 ?2030 12/28/21 ?0416  ?WBC 2.5* 1.9* 1.8*  --  1.7*  ?NEUTROABS 2.2 1.7 1.6*  --  1.5*  ?HGB 12.9 11.6* 10.8*  --  10.5*  ?HCT 36.2 32.6* 29.9*  --  29.7*  ?MCV 89.8 91.6 92.0  --  92.5  ?PLT 23* 20* 18* 60* 43*  ? ? ?Basic Metabolic Panel: ?Recent Labs  ?Lab 12/22/21 ?1509 12/26/21 ?2058 12/27/21 ?0559 12/28/21 ?0175 12/29/21 ?0447  ?NA 131* 132* 134* 135 134*  ?K 4.8 4.2 3.8 3.5 3.4*  ?CL 98 99 104 100 100  ?CO2 '27 26 24 26 25  '$ ?GLUCOSE 124* 197* 135* 151* 136*  ?BUN 24* 23* '15 16 17  '$ ?CREATININE 0.49 0.41* 0.34* 0.50 0.42*  ?CALCIUM 9.3 7.9* 8.1* 8.3* 8.1*  ?MG  --   --   --  2.4 2.3  ? ? ?GFR: ?Estimated Creatinine Clearance: 55.1 mL/min (A) (by C-G formula based on SCr of 0.42 mg/dL (L)). ?Liver Function Tests: ?Recent Labs  ?Lab 12/22/21 ?1509 12/26/21 ?2058 12/27/21 ?0559  12/28/21 ?1025 12/29/21 ?0447  ?AST '18 25 21 22 26  '$ ?ALT 73* 80* 69* 66* 86*  ?ALKPHOS 85 27 78 24 23  ?BILITOT 0.4 0.6 0.5 0.8 0.5  ?PROT 6.2* 5.2* 4.7* 4.9* 4.9*  ?ALBUMIN 3.8 2.9* 2.6* 2.6* 2.6*  ? ? ?No results for input(s): LIPASE, AMYLASE in the last 168 hours. ?No results for input(s): AMMONIA in the last 168 hours. ?Coagulation Profile: ?Recent Labs  ?Lab 12/27/21 ?0559 12/27/21 ?2030 12/28/21 ?0416  ?INR 1.0 1.0 1.0  ? ? ?Cardiac Enzymes: ?No results for input(s): CKTOTAL, CKMB, CKMBINDEX, TROPONINI in the last 168 hours. ?BNP (last 3 results) ?No results for input(s): PROBNP in the last 8760 hours. ?HbA1C: ?No results for input(s): HGBA1C in the last 72 hours. ?CBG: ?No results for input(s): GLUCAP in the last 168 hours. ?Lipid Profile: ?No results for input(s): CHOL, HDL, LDLCALC, TRIG, CHOLHDL, LDLDIRECT in the last 72 hours. ?Thyroid Function Tests: ?No results for input(s): TSH, T4TOTAL, FREET4, T3FREE, THYROIDAB in the last 72 hours. ?  Anemia Panel: ?Recent Labs  ?  12/27/21 ?0559  ?RETICCTPCT 1.1  ? ? ?Sepsis Labs: ?Recent Labs  ?Lab 12/26/21 ?2409 12/26/21 ?2258 12/27/21 ?0559 12/28/21 ?0416  ?PROCALCITON  --   --  <0.10 <0.10  ?LATICACIDVEN 2.7* 2.4*  --   --   ? ? ? ?Recent Results (from the past 240 hour(s))  ?Urine Culture     Status: None  ? Collection Time: 12/26/21 12:19 AM  ? Specimen: Urine, Clean Catch  ?Result Value Ref Range Status  ? Specimen Description   Final  ?  URINE, CLEAN CATCH ?Performed at [facility], [place] [address], [place], [place] [address] ?  ? Special Requests   Final  ?  NONE ?Performed at [facility], [place] [address] [place], [place] [address] ?  ? Culture   Final  ?  NO GROWTH ?Performed at [facility], [place] [address], [place], [place] [address] ?  ? Report Status 12/28/2021 FINAL  Final  ?Culture, blood (routine x 2)     Status: None (Preliminary result)  ? Collection Time: 12/26/21  9:05 PM  ? Specimen: BLOOD  ?Result  Value Ref Range Status  ? Specimen Description   Final  ?  BLOOD BLOOD RIGHT FOREARM ?Performed at [facility], [place] [address] [place], [place] [address] ?  ? Special Requests   Final  ?  BOTTLES DRAWN AEROB

## 2021-12-29 NOTE — TOC Progression Note (Signed)
Transition of Care (TOC) - Progression Note  ? ? ?Patient Details  ?Name: Patricia Stevenson ?MRN: [id number] ?Date of Birth: [birth year] ? ?Transition of Care (TOC) CM/SW Contact  ?Dessa Phi, RN ?Phone Number: ?12/29/2021, 12:11 PM ? ?Clinical Narrative: [facility] [name] agree to HHC/dme/PTAR. [facility] rep Amy accepted for HHPT/OT/csw;[facility] rep Danielle aware of hospital bed(must be in home prior d/c by PTAR);w/c. PTAR @ d/c confirmed address.   ? ? ? ?Expected Discharge Plan: [place] ?Barriers to Discharge: Continued Medical Work up ? ?Expected Discharge Plan and Services ?Expected Discharge Plan: [address] [address] ?  ?Discharge Planning Services: CM Consult ?Post Acute Care Choice: Home Health ?Living arrangements for the past 2 months: [place] ?                ?DME Arranged: Youth worker wheelchair with seat cushion, Hospital bed ?DME Agency: [facility] ?Date DME Agency Contacted: 12/29/21 ?Time DME Agency Contacted: [phone] ?Representative spoke with at DME Agency: Andee Poles ?HH Arranged: PT, OT, Social Work ?[place] Agency: [place] ?Date HH Agency Contacted: 12/29/21 ?Time [place]: [phone] ?Representative spoke with at [place]: Amy ? ? ?Social Determinants of Health (SDOH) Interventions ?  ? ?Readmission Risk Interventions ?   ? View : No data to display.  ?  ?  ?  ? ? ?

## 2021-12-30 DIAGNOSIS — R6510 Systemic inflammatory response syndrome (SIRS) of non-infectious origin without acute organ dysfunction: Secondary | ICD-10-CM | POA: Diagnosis not present

## 2021-12-30 DIAGNOSIS — T451X5A Adverse effect of antineoplastic and immunosuppressive drugs, initial encounter: Secondary | ICD-10-CM

## 2021-12-30 DIAGNOSIS — C719 Malignant neoplasm of brain, unspecified: Secondary | ICD-10-CM | POA: Diagnosis not present

## 2021-12-30 DIAGNOSIS — I480 Paroxysmal atrial fibrillation: Secondary | ICD-10-CM

## 2021-12-30 DIAGNOSIS — R001 Bradycardia, unspecified: Secondary | ICD-10-CM

## 2021-12-30 DIAGNOSIS — D701 Agranulocytosis secondary to cancer chemotherapy: Secondary | ICD-10-CM

## 2021-12-30 DIAGNOSIS — E876 Hypokalemia: Secondary | ICD-10-CM

## 2021-12-30 DIAGNOSIS — D61818 Other pancytopenia: Secondary | ICD-10-CM | POA: Diagnosis not present

## 2021-12-30 DIAGNOSIS — R5381 Other malaise: Secondary | ICD-10-CM

## 2021-12-30 DIAGNOSIS — D72810 Lymphocytopenia: Secondary | ICD-10-CM

## 2021-12-30 LAB — CBC WITH DIFFERENTIAL/PLATELET
Abs Immature Granulocytes: 0.04 10*3/uL (ref 0.00–0.07)
Basophils Absolute: 0 10*3/uL (ref 0.0–0.1)
Basophils Relative: 0 %
Eosinophils Absolute: 0 10*3/uL (ref 0.0–0.5)
Eosinophils Relative: 0 %
HCT: 30.9 % — ABNORMAL LOW (ref 36.0–46.0)
Hemoglobin: 10.7 g/dL — ABNORMAL LOW (ref 12.0–15.0)
Immature Granulocytes: 3 %
Lymphocytes Relative: 7 %
Lymphs Abs: 0.1 10*3/uL — ABNORMAL LOW (ref 0.7–4.0)
MCH: 32.6 pg (ref 26.0–34.0)
MCHC: 34.6 g/dL (ref 30.0–36.0)
MCV: 94.2 fL (ref 80.0–100.0)
Monocytes Absolute: 0.1 10*3/uL (ref 0.1–1.0)
Monocytes Relative: 5 %
Neutro Abs: 1.2 10*3/uL — ABNORMAL LOW (ref 1.7–7.7)
Neutrophils Relative %: 85 %
Platelets: 30 10*3/uL — ABNORMAL LOW (ref 150–400)
RBC: 3.28 MIL/uL — ABNORMAL LOW (ref 3.87–5.11)
RDW: 15.9 % — ABNORMAL HIGH (ref 11.5–15.5)
WBC: 1.3 10*3/uL — CL (ref 4.0–10.5)
nRBC: 1.5 % — ABNORMAL HIGH (ref 0.0–0.2)

## 2021-12-30 LAB — BASIC METABOLIC PANEL
Anion gap: 8 (ref 5–15)
BUN: 23 mg/dL — ABNORMAL HIGH (ref 6–20)
CO2: 26 mmol/L (ref 22–32)
Calcium: 8.1 mg/dL — ABNORMAL LOW (ref 8.9–10.3)
Chloride: 102 mmol/L (ref 98–111)
Creatinine, Ser: 0.37 mg/dL — ABNORMAL LOW (ref 0.44–1.00)
GFR, Estimated: >60 mL/min (ref >60)
Glucose, Bld: 121 mg/dL — ABNORMAL HIGH (ref 70–99)
Potassium: 3.5 mmol/L (ref 3.5–5.1)
Sodium: 136 mmol/L (ref 135–145)

## 2021-12-30 LAB — CBC
HCT: 29.9 % — ABNORMAL LOW (ref 36.0–46.0)
Hemoglobin: 10.8 g/dL — ABNORMAL LOW (ref 12.0–15.0)
MCH: 33.2 pg (ref 26.0–34.0)
MCHC: 36.1 g/dL — ABNORMAL HIGH (ref 30.0–36.0)
MCV: 92 fL (ref 80.0–100.0)
Platelets: 18 10*3/uL — CL (ref 150–400)
RBC: 3.25 MIL/uL — ABNORMAL LOW (ref 3.87–5.11)
RDW: 15.9 % — ABNORMAL HIGH (ref 11.5–15.5)
WBC: 1.8 10*3/uL — ABNORMAL LOW (ref 4.0–10.5)
nRBC: 0 % (ref 0.0–0.2)

## 2021-12-30 LAB — MAGNESIUM: Magnesium: 2.4 mg/dL (ref 1.7–2.4)

## 2021-12-30 MED ORDER — POTASSIUM CHLORIDE CRYS ER 20 MEQ PO TBCR
40.0000 meq | EXTENDED_RELEASE_TABLET | Freq: Once | ORAL | Status: AC
  Administered 2021-12-30: 40 meq via ORAL
  Filled 2021-12-30: qty 2

## 2021-12-30 MED ORDER — TBO-FILGRASTIM 300 MCG/0.5ML ~~LOC~~ SOSY
300.0000 ug | PREFILLED_SYRINGE | Freq: Every day | SUBCUTANEOUS | Status: DC
  Administered 2021-12-30: 300 ug via SUBCUTANEOUS
  Filled 2021-12-30: qty 0.5

## 2021-12-30 NOTE — Progress Notes (Signed)
?PROGRESS NOTE ? ?Patricia Stevenson OYD:[id number] DOB: 10/01/1960  ? ?PCP: Isaac Bliss, Rayford Halsted, MD ? ?Patient is from: Home.  Lives with sister and her family ? ?DOA: 12/26/2021 LOS: 4 ? ?Chief complaints ?Chief Complaint  ?Patient presents with  ? Weakness  ?  ? ?Brief Narrative / Interim history: ?61 year old F with PMH of high-grade glioma s/p surgical resection with recent completion of concurrent chemoradiation with Temodar, paroxysmal A-fib not on AC, HTN, HLD and GERD presenting with generalized weakness admitted with possible severe sepsis/neutropenic fever and pancytopenia but no clear source of infection yet.  She had elevated lactic acid, hypothermia and bradycardia.  CT head without acute change but right temporal lobe encephalomalacia.  CT of abdomen and pelvis without acute finding.  Oncology and neuro oncology consulted.  ?Initially, she was started on broad-spectrum antibiotics.  However, no clear source of infection.  Infectious work-up unrevealing.  Antibiotics discontinued.   ? ?Patient started on Granix per oncology on 5/10.  ? ?Subjective: ?Seen and examined earlier this morning.  Patient's sister at bedside.  No major events overnight of this morning.  No complaints either.  She feels that her strength is better.  She denies chest pain, dyspnea, GI or UTI symptoms. ? ?Objective: ?Vitals:  ? 12/29/21 2031 12/30/21 0549 12/30/21 0555 12/30/21 1309  ?BP: 108/70  119/77 111/76  ?Pulse: 69  (!) 55 67  ?Resp: '20 20 18 16  '$ ?Temp: 98.5 ?F (36.9 ?C)  98.2 ?F (36.8 ?C) 98.6 ?F (37 ?C)  ?TempSrc:   Oral Oral  ?SpO2: 97%  99% 99%  ?Weight:      ?Height:      ? ? ?Examination: ? ?GENERAL: No apparent distress.  Nontoxic. ?HEENT: MMM.  Vision and hearing grossly intact.  Healed craniotomy scar ?NECK: Supple.  No apparent JVD.  ?RESP:  No IWOB.  Fair aeration bilaterally. ?CVS:  RRR. Heart sounds normal.  ?ABD/GI/GU: BS+. Abd soft, NTND.  ?MSK/EXT:  Moves extremities. No apparent deformity. No edema.  ?SKIN:  no apparent skin lesion or wound ?NEURO: Awake, alert and oriented appropriately.  No apparent focal neuro deficit. ?PSYCH: Calm.  Flat affect. ? ?Procedures:  ?None ? ?Microbiology summarized: ?MRSA PCR screen negative. ?Blood culture NGTD ?Urine culture NGTD ? ?Assessment and Plan: ?Pancytopenia ([place]) ?Likely from chemotherapy.  Anemia stable.  She is leukopenic with neutropenia and lymphopenia.  Platelet down to 30k. S/p 1 apheresis of platelet on 5/7. ?-Started on Granix per hematology/oncology ?-Transfuse platelet for platelet count < 20k ?-Transfuse for Hgb <7.5 ? 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 ?-Temodar on hold per oncology ?-Continue Keppra and Decadron ? ?SIRS  ?Initially concern about severe sepsis but no clear source of infection.  Sepsis/severe sepsis ruled out.  Antibiotics discontinued ? ?Paroxysmal atrial fibrillation (H[facility]) ?Rate controlled on p.o. Cardizem.  CHA2DS2-VASc score 2 (sex and HTN). ?-Continue p.o. Cardizem ?-Defer anticoagulation until outpatient follow-up with cardiology ? ?Physical deconditioning ?PT/OT ? ?Hypokalemia ?K3.5.  Mg 2.4. ?-P.o. KCl 40x1 ? ?Hyponatremia ?Resolved ? ?Bradycardia ?Likely from Cardizem.  HR in 60s now. ? ?Hypertension ?Normotensive.  On p.o. Cardizem CD mainly for A-fib ? ? ? ?DVT prophylaxis:  ?SCDs Start: 12/26/21 2346 ? ?Code Status: Full code ?Family Communication: Updated patient's sister at bedside. ?Level of care: Telemetry ?Status is: Inpatient ?Remains inpatient appropriate because: Due to pancytopenia ? ? ?Final disposition: Home once medically cleared ?Consultants:  ?Oncology ?Neurooncology ? ?Sch Meds:  ?Scheduled  Meds: ? dexamethasone  4 mg Oral BID WC  ? diphenhydrAMINE  25 mg Oral Once  ? levETIRAcetam  500 mg Oral BID  ? sodium chloride flush  3 mL Intravenous Q12H  ? Tbo-filgastrim (GRANIX) SQ  300 mcg Subcutaneous q1800  ? ?Continuous Infusions: ?PRN  Meds:.acetaminophen **OR** acetaminophen, dicyclomine, ondansetron **OR** ondansetron (ZOFRAN) IV, polyethylene glycol ? ?Antimicrobials: ?Anti-infectives (From admission, onward)  ? ? Start     Dose/Rate Route Frequency Ordered Stop  ? 12/28/21 0200  vancomycin (VANCOCIN) IVPB 1000 mg/200 mL premix  Status:  Discontinued       ? 1,000 mg ?200 mL/hr over 60 Minutes Intravenous Every 24 hours 12/27/21 0723 12/28/21 1626  ? 12/27/21 1000  fluconazole (DIFLUCAN) tablet 100 mg  Status:  Discontinued       ?Note to Pharmacy: Take 2 tablets on day 1, then 1 tablet daily    ? 100 mg Oral Daily 12/26/21 2351 12/28/21 1626  ? 12/27/21 0800  piperacillin-tazobactam (ZOSYN) IVPB 3.375 g       ? 3.375 g ?12.5 mL/hr over 240 Minutes Intravenous Every 8 hours 12/27/21 0723 12/29/21 2055  ? 12/27/21 0000  vancomycin (VANCOCIN) IVPB 1000 mg/200 mL premix       ? 1,000 mg ?200 mL/hr over 60 Minutes Intravenous  Once 12/26/21 2352 12/27/21 0314  ? 12/27/21 0000  piperacillin-tazobactam (ZOSYN) IVPB 3.375 g       ? 3.375 g ?100 mL/hr over 30 Minutes Intravenous  Once 12/26/21 2352 12/27/21 0134  ? ?  ? ? ? ?I have personally reviewed the following labs and images: ?CBC: ?Recent Labs  ?Lab 12/26/21 ?2058 12/27/21 ?0559 12/27/21 ?2030 12/28/21 ?0416 12/30/21 ?0440  ?WBC 1.9* 1.8*  --  1.7* 1.3*  ?NEUTROABS 1.7 1.6*  --  1.5* 1.2*  ?HGB 11.6* 10.8*  --  10.5* 10.7*  ?HCT 32.6* 29.9*  --  29.7* 30.9*  ?MCV 91.6 92.0  --  92.5 94.2  ?PLT 20* 18* 60* 43* 30*  ? ?BMP &GFR ?Recent Labs  ?Lab 12/26/21 ?2058 12/27/21 ?0559 12/28/21 ?6546 12/29/21 ?5035 12/30/21 ?0440  ?NA 132* 134* 135 134* 136  ?K 4.2 3.8 3.5 3.4* 3.5  ?CL 99 104 100 100 102  ?CO2 '26 24 26 25 26  '$ ?GLUCOSE 197* 135* 151* 136* 121*  ?BUN 23* '15 16 17 '$ 23*  ?CREATININE 0.41* 0.34* 0.50 0.42* 0.37*  ?CALCIUM 7.9* 8.1* 8.3* 8.1* 8.1*  ?MG  --   --  2.4 2.3 2.4  ? ?Estimated Creatinine Clearance: 55.1 mL/min (A) (by C-G formula based on SCr of 0.37 mg/dL (L)). ?Liver &  Pancreas: ?Recent Labs  ?Lab 12/26/21 ?2058 12/27/21 ?0559 12/28/21 ?4656 12/29/21 ?0447  ?AST '25 21 22 26  '$ ?ALT 80* 69* 66* 86*  ?ALKPHOS 53 41 40 44  ?BILITOT 0.6 0.5 0.8 0.5  ?PROT 5.2* 4.7* 4.9* 4.9*  ?ALBUMIN 2.9* 2.6* 2.6* 2.6*  ? ?No results for input(s): LIPASE, AMYLASE in the last 168 hours. ?No results for input(s): AMMONIA in the last 168 hours. ?Diabetic: ?No results for input(s): HGBA1C in the last 72 hours. ?No results for input(s): GLUCAP in the last 168 hours. ?Cardiac Enzymes: ?No results for input(s): CKTOTAL, CKMB, CKMBINDEX, TROPONINI in the last 168 hours. ?No results for input(s): PROBNP in the last 8760 hours. ?Coagulation Profile: ?Recent Labs  ?Lab 12/27/21 ?0559 12/27/21 ?2030 12/28/21 ?0416  ?INR 1.0 1.0 1.0  ? ?Thyroid Function Tests: ?No results for input(s): TSH, T4TOTAL, FREET4, T3FREE, THYROIDAB in the  last 72 hours. ?Lipid Profile: ?No results for input(s): CHOL, HDL, LDLCALC, TRIG, CHOLHDL, LDLDIRECT in the last 72 hours. ?Anemia Panel: ?No results for input(s): VITAMINB12, FOLATE, FERRITIN, TIBC, IRON, RETICCTPCT in the last 72 hours. ?Urine analysis: ?   ?Component Value Date/Time  ? COLORURINE STRAW (A) 12/26/2021 0019  ? APPEARANCEUR CLEAR 12/26/2021 0019  ? LABSPEC 1.026 12/26/2021 0019  ? PHURINE 8.0 12/26/2021 0019  ? GLUCOSEU 50 (A) 12/26/2021 0019  ? [place] NEGATIVE 12/26/2021 0019  ? [place] NEGATIVE 12/26/2021 0019  ? [place] NEGATIVE 12/26/2021 0019  ? PROTEINUR NEGATIVE 12/26/2021 0019  ? NITRITE NEGATIVE 12/26/2021 0019  ? LEUKOCYTESUR NEGATIVE 12/26/2021 0019  ? ?Sepsis Labs: ?Invalid input(s): PROCALCITONIN, LACTICIDVEN ? ?Microbiology: ?Recent Results (from the past 240 hour(s))  ?Urine Culture     Status: None  ? Collection Time: 12/26/21 12:19 AM  ? Specimen: Urine, Clean Catch  ?Result Value Ref Range Status  ? Specimen Description   Final  ?  URINE, CLEAN CATCH ?Performed at [facility], [place] [address] [place], [place] [address] ?  ?  Special Requests   Final  ?  NONE ?Performed at [facility], [place] [address] [place], [place] [address] ?  ? Culture   Final  ?  NO GROWTH ?Performed at [facility], [place]

## 2021-12-30 NOTE — Assessment & Plan Note (Addendum)
S/p surgical resection and chemoradiation with Temodar. ?-Temodar on hold per oncology ?-Continue Keppra and Decadron ?-Seems to have flat affect.  May benefit from formal depression evaluation outpatient ?

## 2021-12-30 NOTE — Assessment & Plan Note (Addendum)
Resolved

## 2021-12-30 NOTE — Hospital Course (Addendum)
61 year old F with PMH of high-grade glioma s/p surgical resection with recent completion of concurrent chemoradiation with Temodar, paroxysmal A-fib not on AC, HTN, HLD and GERD presenting with generalized weakness admitted with possible severe sepsis/neutropenic fever and pancytopenia but no clear source of infection yet.  She had elevated lactic acid, hypothermia and bradycardia.  CT head without acute change but right temporal lobe encephalomalacia.  CT of abdomen and pelvis without acute finding.  Oncology and neuro oncology consulted.  ?Initially, she was started on broad-spectrum antibiotics.  However, no clear source of infection.  Infectious work-up unrevealing.  Antibiotics discontinued.   ? ?Patient started on Granix per oncology on 5/10.  Leukopenia improved.  Granix discontinued on 5/11.  Thrombocytopenia continued to get worse.  Getting platelet transfusion.  Oncology following. ? ?Therapy recommended home health and DME ?

## 2021-12-30 NOTE — Assessment & Plan Note (Addendum)
Initially concern about severe sepsis but no clear source of infection.  Sepsis/severe sepsis ruled out.  Antibiotics discontinued ?

## 2021-12-30 NOTE — Assessment & Plan Note (Addendum)
Normotensive off Cardizem. ?

## 2021-12-30 NOTE — Assessment & Plan Note (Addendum)
Relatively stable.  Continue monitoring ?

## 2021-12-30 NOTE — Progress Notes (Signed)
Physical Therapy Treatment ?Patient Details ?Name: Patricia Stevenson ?MRN: [id number] ?DOB: [birth year] ?Today's Date: 12/30/2021 ? ? ?History of Present Illness 61 yo female admitted with SIRS. She is currently undergoing chemo-radiation for brain cancer. Per family, has had a pretty quick decline over last week or so prior to this admission. Hx of craniotomy 08/2021, residual L sided weakness,  lap chole 10/22, Afib, glioblastoma, falls, ? ?  ?PT Comments  ? ? Pt in bed easily aroused with Daughter who is a CNA (not here) at bedside.  Assisted OOB to recliner was very difficult.  General bed mobility comments: Assist for trunk and bil LEs. Utilized bedpad for scooting, positiong. Increased time. Multimodal cueing provided. Pt fatigues very quickly. General transfer comment: first attempted sit to stand with walker + 2 side by side assist however pt was unable to rise/clear hips off bed.  Then performed "Bear Hug" SPS 1/4 turn from elevated bed to [facility].  Pt was unable to support her weight. and fearful of falling. General Gait Details: unable to support herself and unable to take any functional steps. ?Pt plans to D/C to home via PTAR and full family support as well as [place] services.  ?Recommendations for follow up therapy are one component of a multi-disciplinary discharge planning process, led by the attending physician.  Recommendations may be updated based on patient status, additional functional criteria and insurance authorization. ? ?Follow Up Recommendations ? Home health PT ?  ?  ?Assistance Recommended at Discharge Frequent or constant Supervision/Assistance  ?Patient can return home with the following Two people to help with walking and/or transfers;Two people to help with bathing/dressing/bathroom;Assist for transportation;Help with stairs or ramp for entrance;Assistance with cooking/housework ?  ?Equipment Recommendations ? Hospital bed;BSC/3in1;Wheelchair (measurements PT)  ?  ?Recommendations for Other Services    ? ? ?  ?Precautions / Restrictions Precautions ?Precautions: Fall ?Precaution Comments: incontinent, L side weakness ?Restrictions ?Weight Bearing Restrictions: No  ?  ? ?Mobility ? Bed Mobility ?Overal bed mobility: Needs Assistance ?Bed Mobility: Supine to Sit ?  ?  ?Supine to sit: +2 for safety/equipment, +2 for physical assistance, Max assist ?  ?  ?General bed mobility comments: Assist for trunk and bil LEs. Utilized bedpad for scooting, positiong. Increased time. Multimodal cueing provided. Pt fatigues very quickly. ?  ? ?Transfers ?  ?Equipment used: Rolling walker (2 wheels), None ?  ?  ?  ?  ?  ?  ?  ?General transfer comment: first attempted sit to stand with walker + 2 side by side assist however pt was unable to rise/clear hips off bed.  Then performed "Bear Hug" SPS 1/4 turn from elevated bed to [facility].  Pt was unable to support her weight. and fearful of falling. ?  ? ?Ambulation/Gait ?  ?  ?  ?  ?  ?  ?  ?General Gait Details: unable to support herself and unable to take any functional steps. ? ? ?Stairs ?  ?  ?  ?  ?  ? ? ?Wheelchair Mobility ?  ? ?Modified Rankin (Stroke Patients Only) ?  ? ? ?  ?Balance   ?  ?  ?  ?  ?  ?  ?  ?  ?  ?  ?  ?  ?  ?  ?  ?  ?  ?  ?  ? ?  ?Cognition Arousal/Alertness: Awake/alert ?Behavior During Therapy: Flat affect ?  ?  ?  ?  ?  ?  ?  ?  ?  ?  ?  ?  ?  ?  ?  ?  ?  ?  General Comments: AxO x 2 following repeat commands.  Can speak/understand most English (Spanish speaking)   Profoundly weak. ?  ?  ? ?  ?Exercises   ? ?  ?General Comments   ?  ?  ? ?Pertinent Vitals/Pain    ? ? ?Home Living   ?  ?  ?  ?  ?  ?  ?  ?  ?  ?   ?  ?Prior Function    ?  ?  ?   ? ?PT Goals (current goals can now be found in the care plan section) Progress towards PT goals: Progressing toward goals ? ?  ?Frequency ? ? ? Min 3X/week ? ? ? ?  ?PT Plan Current plan remains appropriate  ? ? ?Co-evaluation   ?  ?  ?  ?  ? ?  ?AM-PAC PT "6 Clicks" Mobility   ?Outcome Measure ? Help needed turning from  your back to your side while in a flat bed without using bedrails?: A Lot ?Help needed moving from lying on your back to sitting on the side of a flat bed without using bedrails?: A Lot ?Help needed moving to and from a bed to a chair (including a wheelchair)?: Total ?Help needed standing up from a chair using your arms (e.g., wheelchair or bedside chair)?: Total ?Help needed to walk in hospital room?: Total ?Help needed climbing 3-5 steps with a railing? : Total ?6 Click Score: 8 ? ?  ?End of Session Equipment Utilized During Treatment: Gait belt ?Activity Tolerance: Patient limited by fatigue ?Patient left: in chair;with chair alarm set;with family/visitor present;with call bell/phone within reach ?Nurse Communication: Mobility status ?PT Visit Diagnosis: History of falling (Z91.81);Muscle weakness (generalized) (M62.81);Difficulty in walking, not elsewhere classified (R26.2);Pain;Adult, failure to thrive (R62.7);Other abnormalities of gait and mobility (R26.89) ?  ? ? ?Time: 1610-9604 ?PT Time Calculation (min) (ACUTE ONLY): 24 min ? ?Charges:  $Therapeutic Activity: 23-37 mins          ?          ? ?Rica Koyanagi  PTA ?Acute  Rehabilitation Services ?Pager      [phone] ?Office      [phone] ? ?

## 2021-12-30 NOTE — Assessment & Plan Note (Addendum)
Likely from chemotherapy.  Anemia stable.  Leukopenia improved after Granix on 5/10. S/p 1 apheresis of platelet on 5/7 and 5/12. Plt improved to 43k ?-Repeat CBC at follow-up. ?-Oncology follow-up ?

## 2021-12-30 NOTE — Assessment & Plan Note (Addendum)
HH PT/OT and DME's ordered. ?

## 2021-12-30 NOTE — Assessment & Plan Note (Addendum)
Cardizem discontinued due to bradycardia.  Remains in NSR with HR in 60s.  CHA2DS2-VASc score 2 (sex and HTN). ?Outpatient follow-up with cardiology ?

## 2021-12-31 DIAGNOSIS — D709 Neutropenia, unspecified: Secondary | ICD-10-CM | POA: Diagnosis not present

## 2021-12-31 DIAGNOSIS — R4589 Other symptoms and signs involving emotional state: Secondary | ICD-10-CM

## 2021-12-31 DIAGNOSIS — D61818 Other pancytopenia: Secondary | ICD-10-CM | POA: Diagnosis not present

## 2021-12-31 DIAGNOSIS — R6510 Systemic inflammatory response syndrome (SIRS) of non-infectious origin without acute organ dysfunction: Secondary | ICD-10-CM | POA: Diagnosis not present

## 2021-12-31 DIAGNOSIS — C719 Malignant neoplasm of brain, unspecified: Secondary | ICD-10-CM | POA: Diagnosis not present

## 2021-12-31 DIAGNOSIS — I480 Paroxysmal atrial fibrillation: Secondary | ICD-10-CM | POA: Diagnosis not present

## 2021-12-31 LAB — RENAL FUNCTION PANEL
Albumin: 2.7 g/dL — ABNORMAL LOW (ref 3.5–5.0)
Anion gap: 10 (ref 5–15)
BUN: 16 mg/dL (ref 6–20)
CO2: 20 mmol/L — ABNORMAL LOW (ref 22–32)
Calcium: 8 mg/dL — ABNORMAL LOW (ref 8.9–10.3)
Chloride: 102 mmol/L (ref 98–111)
Creatinine, Ser: 0.37 mg/dL — ABNORMAL LOW (ref 0.44–1.00)
GFR, Estimated: >60 mL/min (ref >60)
Glucose, Bld: 145 mg/dL — ABNORMAL HIGH (ref 70–99)
Phosphorus: 2.4 mg/dL — ABNORMAL LOW (ref 2.5–4.6)
Potassium: 3.7 mmol/L (ref 3.5–5.1)
Sodium: 132 mmol/L — ABNORMAL LOW (ref 135–145)

## 2021-12-31 LAB — MAGNESIUM: Magnesium: 2.3 mg/dL (ref 1.7–2.4)

## 2021-12-31 NOTE — Assessment & Plan Note (Addendum)
She is at risk for depression. Not an imminent danger to herself or others.  Seems to have normal affect today. ?-Recommended discussing with PCP at follow-up.  She could benefit from formal depression evaluation ?

## 2021-12-31 NOTE — Progress Notes (Signed)
?   12/31/21 2956  ?Provider Notification  ?Provider Name/Title Gershon Cull, NP  ?Date Provider Notified 12/31/21  ?Time Provider Notified [phone]  ?Method of Notification Page  ?Notification Reason Critical result  ?Test performed and critical result Plt 25  ?Date Critical Result Received 12/31/21  ?Time Critical Result Received [id number]  ?Provider response No new orders  ?Date of Provider Response 12/31/21  ?Time of Provider Response 0745  ? ? ?

## 2021-12-31 NOTE — Plan of Care (Signed)
  Problem: Education: Goal: Knowledge of General Education information will improve Description: Including pain rating scale, medication(s)/side effects and non-pharmacologic comfort measures Outcome: Progressing   Problem: Clinical Measurements: Goal: Will remain free from infection Outcome: Progressing Goal: Diagnostic test results will improve Outcome: Progressing Goal: Respiratory complications will improve Outcome: Progressing   

## 2021-12-31 NOTE — Progress Notes (Addendum)
HEMATOLOGY-ONCOLOGY PROGRESS NOTE ? ?ASSESSMENT AND PLAN: ?61 year old lady with recently diagnosed high-grade glioma, status post surgery in January 2023, finished concurrent chemoradiation on April 21st 2023.  ?  ?Generalized weakness, transient hypotension and hypothermia, concerning for sepsis ?Worsening pancytopenia, secondary to chemo, versus infection or other etiology ?High-grade glioma, finished concurrent chemoradiation on April 21, last dose Temodar held since December 08, 2021 due to thrombocytopenia. ?Hypertension ?Atrial fibrillation ?  ?Recommendations: ?-Review of labs shows no evidence of hemolysis.  The low reticulocyte count and immature platelet count indicate low bone marrow production.  Pancytopenia likely due to chemotherapy. ?-She received Granix ANC is up to 2.5 today.  Granix has been discontinued. ?-Her hemoglobin is stable but she has thrombocytopenia.  Platelet count 25,000 today.  She is not bleeding.  Recommend close monitoring and transfuse platelets for platelet count less than 20,000 or active bleeding. ? ?Mikey Bussing, DNP, AGPCNP-BC, AOCNP ? ?SUBJECTIVE: The patient is sitting up in the recliner.  She is alert.  Does not talk much.  No complaints.  No bleeding. ? 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  ?09/15/2021 Cancer Staging  ? Staging form: Brain and Spinal Cord, AJCC Version 9 ?- Clinical stage from 09/15/2021: No stage assigned - Signed by Ventura Sellers, MD on 10/08/2021 ?Histopathologic type: Astrocytoma, NOS ?Stage prefix: Initial diagnosis ?  ?10/19/2021 -  Chemotherapy  ? Patient is on Treatment Plan : BRAIN GLIOBLASTOMA Radiation Therapy With Concurrent Temozolomide 75 mg/m2 Daily Followed By Sequential Maintenance Temozolomide x 6-12 cycles  ?   ? ? ? ?REVIEW OF SYSTEMS:   ?Review of Systems  ?Reason unable to perform ROS: Unable to obtain a  comprehensive review of systems, pertinent positives/negatives noted below.  ?Constitutional:  Negative for fever.  ?Endo/Heme/Allergies:   ?     No bleeding  ? ?I have reviewed the past medical history, past surgical history, social history and family history with the patient and they are unchanged from previous note. ? ? ?PHYSICAL EXAMINATION: ?ECOG PERFORMANCE STATUS: 3 - Symptomatic, >50% confined to bed ? ?Vitals:  ? 12/30/21 2026 12/31/21 6269  ?BP: 110/74 100/68  ?Pulse: 70 (!) 58  ?Resp: 18 18  ?Temp: 98.1 ?F (36.7 ?C) (!) 97.5 ?F (36.4 ?C)  ?SpO2: 98% 99%  ? ?Filed Weights  ? 12/27/21 1538  ?Weight: 53.8 kg  ? ? ?Intake/Output from previous day: ?05/10 0701 - 05/11 0700 ?In: 1200 [P.O.:1200] ?Out: 925 [Urine:925] ? ?Physical Exam ?Vitals reviewed.  ?Constitutional:   ?   General: She is not in acute distress. ?Eyes:  ?   General: No scleral icterus. ?   Conjunctiva/sclera: Conjunctivae normal.  ?Cardiovascular:  ?   Rate and Rhythm: Normal rate and regular rhythm.  ?Pulmonary:  ?   Effort: Pulmonary effort is normal. No respiratory distress.  ?   Breath sounds: Normal breath sounds.  ?Abdominal:  ?   Palpations: Abdomen is soft.  ?   Tenderness: There is no abdominal tenderness.  ?Skin: ?   General: Skin is warm and dry.  ?Neurological:  ?   Mental Status: She is alert. Mental status is at baseline.  ? ? ?LABORATORY DATA:  ?I have reviewed the data as listed ? ?  Latest Ref Rng & Units 12/31/2021  ?  5:16 AM 12/30/2021  ?  4:40 AM 12/29/2021  ?  4:47 AM  ?CMP  ?  Glucose 70 - 99 mg/dL 145   121   136    ?BUN 6 - 20 mg/dL '16   23   17    '$ ?Creatinine 0.44 - 1.00 mg/dL 0.37   0.37   0.42    ?Sodium 135 - 145 mmol/L 132   136   134    ?Potassium 3.5 - 5.1 mmol/L 3.7   3.5   3.4    ?Chloride 98 - 111 mmol/L 102   102   100    ?CO2 22 - 32 mmol/L '20   26   25    '$ ?Calcium 8.9 - 10.3 mg/dL 8.0   8.1   8.1    ?Total Protein 6.5 - 8.1 g/dL   4.9    ?Total Bilirubin 0.3 - 1.2 mg/dL   0.5    ?Alkaline Phos 38 - 126 U/L   44     ?AST 15 - 41 U/L   26    ?ALT 0 - 44 U/L   86    ? ? ?Lab Results  ?Component Value Date  ? WBC 2.8 (L) 12/31/2021  ? HGB 10.6 (L) 12/31/2021  ? HCT 30.9 (L) 12/31/2021  ? MCV 94.5 12/31/2021  ? PLT 25 (LL) 12/31/2021  ? NEUTROABS 2.5 12/31/2021  ? ? ?No results found for: CEA1, CEA, [id number], CA125, PSA1 ? ?CT Head Wo Contrast ? ?Result Date: 12/26/2021 ?CLINICAL DATA:  Altered mental status EXAM: CT HEAD WITHOUT CONTRAST TECHNIQUE: Contiguous axial images were obtained from the base of the skull through the vertex without intravenous contrast. RADIATION DOSE REDUCTION: This exam was performed according to the departmental dose-optimization program which includes automated exposure control, adjustment of the mA and/or kV according to patient size and/or use of iterative reconstruction technique. COMPARISON:  None Available. FINDINGS: Brain: Right temporal lobe encephalomalacia at site of prior tumor resection. There is hyperdensity near the right cerebral peduncle corresponding to previously demonstrated area of tumor. No acute hemorrhage or extra-axial collection. Generalized volume loss with periventricular white matter changes. Vascular: No abnormal hyperdensity of the major intracranial arteries or dural venous sinuses. No intracranial atherosclerosis. Skull: Remote right craniotomy. Sinuses/Orbits: No fluid levels or advanced mucosal thickening of the visualized paranasal sinuses. No mastoid or middle ear effusion. The orbits are normal. IMPRESSION: 1. No acute intracranial abnormality. 2. Right temporal lobe encephalomalacia at site of prior tumor resection. Electronically Signed   By: Ulyses Jarred M.D.   On: 12/26/2021 23:07  ? ?CT Angio Chest PE W and/or Wo Contrast ? ?Result Date: 12/16/2021 ?CLINICAL DATA:  Shortness of breath and left lower extremity edema. Brain tumor. EXAM: CT ANGIOGRAPHY CHEST WITH CONTRAST TECHNIQUE: Multidetector CT imaging of the chest was performed using the standard protocol during  bolus administration of intravenous contrast. Multiplanar CT image reconstructions and MIPs were obtained to evaluate the vascular anatomy. RADIATION DOSE REDUCTION: This exam was performed according to the departmental dose-optimization program which includes automated exposure control, adjustment of the mA and/or kV according to patient size and/or use of iterative reconstruction technique. CONTRAST:  35m OMNIPAQUE IOHEXOL 350 MG/ML SOLN COMPARISON:  None. FINDINGS: Cardiovascular: Negative for pulmonary embolus. Atherosclerotic calcification of the aorta. Enlarged pulmonic trunk. Heart is at the upper limits of normal in size. No pericardial effusion. Mediastinum/Nodes: No pathologically enlarged mediastinal, hilar or axillary lymph nodes. Esophagus is grossly unremarkable. Lungs/Pleura: Ground-glass is seen in both lower lobes and may be due to slight expiratory phase imaging. No dense airspace consolidation. No pleural fluid.  Airway is unremarkable. Upper Abdomen: Visualized portions of the liver, adrenal glands, kidneys, spleen, pancreas, stomach and bowel are grossly unremarkable. Musculoskeletal: Degenerative changes in the spine. No worrisome lytic or sclerotic lesions. Review of the MIP images confirms the above findings. IMPRESSION: 1. Negative for pulmonary embolus. 2. No definitive findings to explain the patient's clinical history. 3.  Aortic atherosclerosis (ICD10-I70.0). 4. Enlarged pulmonic trunk, indicative of pulmonary arterial hypertension. Electronically Signed   By: Lorin Picket M.D.   On: 12/16/2021 16:10  ? ?MR BRAIN W WO CONTRAST ? ?Result Date: 12/27/2021 ?CLINICAL DATA:  61 year old female with high-grade glioma status post subtotal resection. Altered mental status. EXAM: MRI HEAD WITHOUT AND WITH CONTRAST TECHNIQUE: Multiplanar, multiecho pulse sequences of the brain and surrounding structures were obtained without and with intravenous contrast. CONTRAST:  68m GADAVIST GADOBUTROL 1  MMOL/ML IV SOLN COMPARISON:  Head CT yesterday. Restaging brain MRI 10/27/2021 and earlier. FINDINGS: Brain: Rounded, masslike enhancement at the right midbrain and cerebral peduncle has regressed and

## 2021-12-31 NOTE — Progress Notes (Signed)
?PROGRESS NOTE ? ?Patricia Stevenson QZR:[id number] DOB: 09/14/1960  ? ?PCP: Isaac Bliss, Rayford Halsted, MD ? ?Patient is from: Home.  Lives with sister and her family ? ?DOA: 12/26/2021 LOS: 5 ? ?Chief complaints ?Chief Complaint  ?Patient presents with  ? Weakness  ?  ? ?Brief Narrative / Interim history: ?61 year old F with PMH of high-grade glioma s/p surgical resection with recent completion of concurrent chemoradiation with Temodar, paroxysmal A-fib not on AC, HTN, HLD and GERD presenting with generalized weakness admitted with possible severe sepsis/neutropenic fever and pancytopenia but no clear source of infection yet.  She had elevated lactic acid, hypothermia and bradycardia.  CT head without acute change but right temporal lobe encephalomalacia.  CT of abdomen and pelvis without acute finding.  Oncology and neuro oncology consulted.  ?Initially, she was started on broad-spectrum antibiotics.  However, no clear source of infection.  Infectious work-up unrevealing.  Antibiotics discontinued.   ? ?Patient started on Granix per oncology on 5/10.  Leukopenia improved.  Granix discontinued on 5/11.  Thrombocytopenia seems to be worse.  Oncology following. ? ?Therapy recommended home health and DME  ? ?Subjective: ?Seen and examined earlier this morning.  No major events overnight of this morning.  No complaints.  She denies pain, shortness of breath, GI or UTI symptoms.  Patient's sister at bedside. ? ?Objective: ?Vitals:  ? 12/30/21 1900 12/30/21 2026 12/31/21 0613 12/31/21 1446  ?BP: 110/72 110/74 100/68 105/78  ?Pulse: 70 70 (!) 58 73  ?Resp:  '18 18 18  '$ ?Temp: 98.1 ?F (36.7 ?C) 98.1 ?F (36.7 ?C) (!) 97.5 ?F (36.4 ?C) 98.1 ?F (36.7 ?C)  ?TempSrc: Oral Oral Oral Oral  ?SpO2: 98% 98% 99% 98%  ?Weight:      ?Height:      ? ? ?Examination: ? ?GENERAL: No apparent distress.  Nontoxic. ?HEENT: MMM.  Vision and hearing grossly intact.  Healed craniotomy scar ?NECK: Supple.  No apparent JVD.  ?RESP:  No IWOB.  Fair aeration  bilaterally. ?CVS:  RRR. Heart sounds normal.  ?ABD/GI/GU: BS+. Abd soft, NTND.  ?MSK/EXT:  Moves extremities. No apparent deformity. No edema.  ?SKIN: no apparent skin lesion or wound ?NEURO: Awake and alert. Oriented appropriately.  No apparent focal neuro deficit. ?PSYCH: Calm.  Flat affect. ? ?Procedures:  ?None ? ?Microbiology summarized: ?MRSA PCR screen negative. ?Blood culture NGTD ?Urine culture NGTD ? ?Assessment and Plan: ?Pancytopenia ([place]) ?Likely from chemotherapy.  Anemia stable.  Leukopenia improved after Granix on 5/10. S/p 1 apheresis of platelet on 5/7.  Thrombocytopenia worse today. ?-Hematology/oncology following. ?-Transfuse platelet for platelet count < 20k ?-Transfuse for Hgb <7.5 ? ?High grade glioma not classifiable by WHO criteria ([place]) ?S/p surgical resection and chemoradiation with Temodar. ?-Temodar on hold per oncology ?-Continue Keppra and Decadron ?-Seems to have flat affect.  May benefit from formal depression evaluation outpatient ? ?SIRS  ?Initially concern about severe sepsis but no clear source of infection.  Sepsis/severe sepsis ruled out.  Antibiotics discontinued ? ?Paroxysmal atrial fibrillation ([facility]) ?Rate controlled on p.o. Cardizem.  CHA2DS2-VASc score 2 (sex and HTN). ?-Continue p.o. Cardizem ?-Defer anticoagulation until outpatient follow-up with cardiology ? ?Physical deconditioning ?PT/OT ? ?Hypokalemia ?Resolved. ? ?Hyponatremia ?Relatively stable.  Continue monitoring ? ?Bradycardia ?Resolved. ? ?Hypertension ?Normotensive.  On p.o. Cardizem CD mainly for A-fib ? ? ? ?DVT prophylaxis:  ?SCDs Start: 12/26/21 2346 ? ?Code Status: Full code ?Family Communication: Updated patient's sister at bedside. ?Level of care: Telemetry ?Status is: Inpatient ?Remains inpatient appropriate because: Due  to pancytopenia with worsening thrombocytopenia ? ? ?Final disposition: Home once medically cleared ?Consultants:  ?Oncology ?Neurooncology ? ?Sch Meds:  ?Scheduled Meds: ?  dexamethasone  4 mg Oral BID WC  ? diphenhydrAMINE  25 mg Oral Once  ? levETIRAcetam  500 mg Oral BID  ? sodium chloride flush  3 mL Intravenous Q12H  ? ?Continuous Infusions: ?PRN Meds:.acetaminophen **OR** acetaminophen, dicyclomine, ondansetron **OR** ondansetron (ZOFRAN) IV, polyethylene glycol ? ?Antimicrobials: ?Anti-infectives (From admission, onward)  ? ? Start     Dose/Rate Route Frequency Ordered Stop  ? 12/28/21 0200  vancomycin (VANCOCIN) IVPB 1000 mg/200 mL premix  Status:  Discontinued       ? 1,000 mg ?200 mL/hr over 60 Minutes Intravenous Every 24 hours 12/27/21 0723 12/28/21 1626  ? 12/27/21 1000  fluconazole (DIFLUCAN) tablet 100 mg  Status:  Discontinued       ?Note to Pharmacy: Take 2 tablets on day 1, then 1 tablet daily    ? 100 mg Oral Daily 12/26/21 2351 12/28/21 1626  ? 12/27/21 0800  piperacillin-tazobactam (ZOSYN) IVPB 3.375 g       ? 3.375 g ?12.5 mL/hr over 240 Minutes Intravenous Every 8 hours 12/27/21 0723 12/29/21 2055  ? 12/27/21 0000  vancomycin (VANCOCIN) IVPB 1000 mg/200 mL premix       ? 1,000 mg ?200 mL/hr over 60 Minutes Intravenous  Once 12/26/21 2352 12/27/21 0314  ? 12/27/21 0000  piperacillin-tazobactam (ZOSYN) IVPB 3.375 g       ? 3.375 g ?100 mL/hr over 30 Minutes Intravenous  Once 12/26/21 2352 12/27/21 0134  ? ?  ? ? ? ?I have personally reviewed the following labs and images: ?CBC: ?Recent Labs  ?Lab 12/26/21 ?2058 12/27/21 ?0559 12/27/21 ?2030 12/28/21 ?0416 12/30/21 ?1610 12/31/21 ?9604  ?WBC 1.9* 1.8*  --  1.7* 1.3* 2.8*  ?NEUTROABS 1.7 1.6*  --  1.5* 1.2* 2.5  ?HGB 11.6* 10.8*  --  10.5* 10.7* 10.6*  ?HCT 32.6* 29.9*  --  29.7* 30.9* 30.9*  ?MCV 91.6 92.0  --  92.5 94.2 94.5  ?PLT 20* 18* 60* 43* 30* 25*  ? ?BMP &GFR ?Recent Labs  ?Lab 12/27/21 ?0559 12/28/21 ?5409 12/29/21 ?8119 12/30/21 ?1478 12/31/21 ?2956  ?NA 134* 135 134* 136 132*  ?K 3.8 3.5 3.4* 3.5 3.7  ?CL 104 100 100 102 102  ?CO2 '24 26 25 26 '$ 20*  ?GLUCOSE 135* 151* 136* 121* 145*  ?BUN '15 16 17 '$ 23* 16   ?CREATININE 0.34* 0.50 0.42* 0.37* 0.37*  ?CALCIUM 8.1* 8.3* 8.1* 8.1* 8.0*  ?MG  --  2.4 2.3 2.4 2.3  ?PHOS  --   --   --   --  2.4*  ? ?Estimated Creatinine Clearance: 55.1 mL/min (A) (by C-G formula based on SCr of 0.37 mg/dL (L)). ?Liver & Pancreas: ?Recent Labs  ?Lab 12/26/21 ?2058 12/27/21 ?0559 12/28/21 ?2130 12/29/21 ?8657 12/31/21 ?8469  ?AST '25 21 22 26  '$ --   ?ALT 80* 69* 66* 86*  --   ?ALKPHOS 53 41 40 44  --   ?BILITOT 0.6 0.5 0.8 0.5  --   ?PROT 5.2* 4.7* 4.9* 4.9*  --   ?ALBUMIN 2.9* 2.6* 2.6* 2.6* 2.7*  ? ?No results for input(s): LIPASE, AMYLASE in the last 168 hours. ?No results for input(s): AMMONIA in the last 168 hours. ?Diabetic: ?No results for input(s): HGBA1C in the last 72 hours. ?No results for input(s): GLUCAP in the last 168 hours. ?Cardiac Enzymes: ?No results for input(s): CKTOTAL,  CKMB, CKMBINDEX, TROPONINI in the last 168 hours. ?No results for input(s): PROBNP in the last 8760 hours. ?Coagulation Profile: ?Recent Labs  ?Lab 12/27/21 ?0559 12/27/21 ?2030 12/28/21 ?0416  ?INR 1.0 1.0 1.0  ? ?Thyroid Function Tests: ?No results for input(s): TSH, T4TOTAL, FREET4, T3FREE, THYROIDAB in the last 72 hours. ?Lipid Profile: ?No results for input(s): CHOL, HDL, LDLCALC, TRIG, CHOLHDL, LDLDIRECT in the last 72 hours. ?Anemia Panel: ?No results for input(s): VITAMINB12, FOLATE, FERRITIN, TIBC, IRON, RETICCTPCT in the last 72 hours. ?Urine analysis: ?   ?Component Value Date/Time  ? COLORURINE STRAW (A) 12/26/2021 0019  ? APPEARANCEUR CLEAR 12/26/2021 0019  ? LABSPEC 1.026 12/26/2021 0019  ? PHURINE 8.0 12/26/2021 0019  ? GLUCOSEU 50 (A) 12/26/2021 0019  ? [place] NEGATIVE 12/26/2021 0019  ? [place] NEGATIVE 12/26/2021 0019  ? [place] NEGATIVE 12/26/2021 0019  ? PROTEINUR NEGATIVE 12/26/2021 0019  ? NITRITE NEGATIVE 12/26/2021 0019  ? LEUKOCYTESUR NEGATIVE 12/26/2021 0019  ? ?Sepsis Labs: ?Invalid input(s): PROCALCITONIN, LACTICIDVEN ? ?Microbiology: ?Recent Results (from the past 240  hour(s))  ?Urine Culture     Status: None  ? Collection Time: 12/26/21 12:19 AM  ? Specimen: Urine, Clean Catch  ?Result Value Ref Range Status  ? Specimen Description   Final  ?  URINE, CLEAN CATCH ?Performed at [facility]

## 2022-01-01 ENCOUNTER — Ambulatory Visit: Payer: 59 | Admitting: Physical Therapy

## 2022-01-01 ENCOUNTER — Ambulatory Visit: Payer: 59 | Admitting: Occupational Therapy

## 2022-01-01 ENCOUNTER — Ambulatory Visit (HOSPITAL_COMMUNITY): Payer: 59

## 2022-01-01 DIAGNOSIS — R6510 Systemic inflammatory response syndrome (SIRS) of non-infectious origin without acute organ dysfunction: Secondary | ICD-10-CM | POA: Diagnosis not present

## 2022-01-01 DIAGNOSIS — C719 Malignant neoplasm of brain, unspecified: Secondary | ICD-10-CM | POA: Diagnosis not present

## 2022-01-01 DIAGNOSIS — I480 Paroxysmal atrial fibrillation: Secondary | ICD-10-CM | POA: Diagnosis not present

## 2022-01-01 DIAGNOSIS — D61818 Other pancytopenia: Secondary | ICD-10-CM | POA: Diagnosis not present

## 2022-01-01 LAB — RENAL FUNCTION PANEL
Albumin: 2.6 g/dL — ABNORMAL LOW (ref 3.5–5.0)
Anion gap: 9 (ref 5–15)
BUN: 17 mg/dL (ref 6–20)
CO2: 20 mmol/L — ABNORMAL LOW (ref 22–32)
Calcium: 7.7 mg/dL — ABNORMAL LOW (ref 8.9–10.3)
Chloride: 103 mmol/L (ref 98–111)
Creatinine, Ser: 0.38 mg/dL — ABNORMAL LOW (ref 0.44–1.00)
GFR, Estimated: >60 mL/min (ref >60)
Glucose, Bld: 172 mg/dL — ABNORMAL HIGH (ref 70–99)
Phosphorus: 2.5 mg/dL (ref 2.5–4.6)
Potassium: 3.5 mmol/L (ref 3.5–5.1)
Sodium: 132 mmol/L — ABNORMAL LOW (ref 135–145)

## 2022-01-01 LAB — MAGNESIUM: Magnesium: 2.2 mg/dL (ref 1.7–2.4)

## 2022-01-01 LAB — CBC WITH DIFFERENTIAL/PLATELET
Abs Immature Granulocytes: 0 10*3/uL (ref 0.00–0.07)
Band Neutrophils: 6 %
Basophils Absolute: 0 10*3/uL (ref 0.0–0.1)
Basophils Relative: 0 %
Eosinophils Absolute: 0 10*3/uL (ref 0.0–0.5)
Eosinophils Relative: 0 %
HCT: 30.9 % — ABNORMAL LOW (ref 36.0–46.0)
Hemoglobin: 10.6 g/dL — ABNORMAL LOW (ref 12.0–15.0)
Lymphocytes Relative: 4 %
Lymphs Abs: 0.1 10*3/uL — ABNORMAL LOW (ref 0.7–4.0)
MCH: 32.4 pg (ref 26.0–34.0)
MCHC: 34.3 g/dL (ref 30.0–36.0)
MCV: 94.5 fL (ref 80.0–100.0)
Monocytes Absolute: 0.2 10*3/uL (ref 0.1–1.0)
Monocytes Relative: 6 %
Neutro Abs: 2.5 10*3/uL (ref 1.7–7.7)
Neutrophils Relative %: 84 %
Platelets: 25 10*3/uL — CL (ref 150–400)
RBC: 3.27 MIL/uL — ABNORMAL LOW (ref 3.87–5.11)
RDW: 16 % — ABNORMAL HIGH (ref 11.5–15.5)
WBC: 2.8 10*3/uL — ABNORMAL LOW (ref 4.0–10.5)
nRBC: 0 % (ref 0.0–0.2)

## 2022-01-01 LAB — CULTURE, BLOOD (ROUTINE X 2)
Culture: NO GROWTH
Culture: NO GROWTH
Special Requests: ADEQUATE
Special Requests: ADEQUATE

## 2022-01-01 LAB — CBC
HCT: 31.4 % — ABNORMAL LOW (ref 36.0–46.0)
Hemoglobin: 10.7 g/dL — ABNORMAL LOW (ref 12.0–15.0)
MCH: 32.4 pg (ref 26.0–34.0)
MCHC: 34.1 g/dL (ref 30.0–36.0)
MCV: 95.2 fL (ref 80.0–100.0)
Platelets: 18 10*3/uL — CL (ref 150–400)
RBC: 3.3 MIL/uL — ABNORMAL LOW (ref 3.87–5.11)
RDW: 16.2 % — ABNORMAL HIGH (ref 11.5–15.5)
WBC: 2.8 10*3/uL — ABNORMAL LOW (ref 4.0–10.5)
nRBC: 0 % (ref 0.0–0.2)

## 2022-01-01 MED ORDER — SODIUM CHLORIDE 0.9% IV SOLUTION: Freq: Once | INTRAVENOUS | Status: DC

## 2022-01-01 MED ORDER — POTASSIUM CHLORIDE CRYS ER 20 MEQ PO TBCR
40.0000 meq | EXTENDED_RELEASE_TABLET | Freq: Once | ORAL | Status: AC
  Administered 2022-01-01: 40 meq via ORAL
  Filled 2022-01-01: qty 2

## 2022-01-01 MED ORDER — SODIUM CHLORIDE 0.9% IV SOLUTION: Freq: Once | INTRAVENOUS | Status: AC

## 2022-01-01 NOTE — Progress Notes (Signed)
Physical Therapy Treatment ?Patient Details ?Name: Patricia Stevenson ?MRN: [id number] ?DOB: 08-07-1961 ?Today's Date: 01/01/2022 ? ? ?History of Present Illness 61 yo female admitted with SIRS. She is currently undergoing chemo-radiation for brain cancer. Per family, has had a pretty quick decline over last week or so prior to this admission. Hx of craniotomy 08/2021, residual L sided weakness,  lap chole 10/22, Afib, glioblastoma, falls, ? ?  ?PT Comments  ? ?  Patient assisted with mobility. Standing and steps to transfer to recliner with 2 max assist for  balance. Patient's sister present  to assist with directions/interpreting.  ?Patient has family support and will Dc to home with HHPT.    ?Recommendations for follow up therapy are one component of a multi-disciplinary discharge planning process, led by the attending physician.  Recommendations may be updated based on patient status, additional functional criteria and insurance authorization. ? ?Follow Up Recommendations ? Home health PT ?  ?  ?Assistance Recommended at Discharge Frequent or constant Supervision/Assistance  ?Patient can return home with the following Two people to help with walking and/or transfers;Two people to help with bathing/dressing/bathroom;Assist for transportation;Help with stairs or ramp for entrance;Assistance with cooking/housework ?  ?Equipment Recommendations ? Hospital bed;BSC/3in1;Wheelchair (measurements PT)  ?  ?Recommendations for Other Services   ? ? ?  ?Precautions / Restrictions Precautions ?Precautions: Fall ?Precaution Comments: incontinent, L side weakness ?Restrictions ?Weight Bearing Restrictions: No  ?  ? ?Mobility ? Bed Mobility ?  ?Bed Mobility: Supine to Sit ?Rolling: Mod assist ?  ?  ?  ?  ?General bed mobility comments: Assist for trunk and bil LEs. Utilized bedpad for scooting, positiong. Increased time. Multimodal cueing provided. Pt fatigues very quickly. ?  ? ?Transfers ?Overall transfer level: Needs  assistance ?Equipment used: Rolling walker (2 wheels), None ?Transfers: Sit to/from Stand, Bed to chair/wheelchair/BSC ?Sit to Stand: +2 physical assistance, +2 safety/equipment, Mod assist, Max assist ?  ?  ?  ?  ?  ?General transfer comment: max  assisted  of 2  to stand at [facility] for a few seconds, patient sat back down. Assisted again to stnad , facilitated WS to step to recliner ~ 4 small steps. ?  ? ?Ambulation/Gait ?  ?  ?  ?  ?  ?  ?  ?  ? ? ?Stairs ?  ?  ?  ?  ?  ? ? ?Wheelchair Mobility ?  ? ?Modified Rankin (Stroke Patients Only) ?  ? ? ?  ?Balance   ?Sitting-balance support: No upper extremity supported, Feet supported ?Sitting balance-Leahy Scale: Fair ?Sitting balance - Comments: Sat with Min guard assist for at least 5 minutes before fatiguing and wanting to return to bed ?  ?Standing balance support: Bilateral upper extremity supported, During functional activity, Reliant on assistive device for balance ?Standing balance-Leahy Scale: Zero ?  ?  ?  ?  ?  ?  ?  ?  ?  ?  ?  ?  ?  ? ?  ?Cognition   ?  ?  ?  ?  ?  ?  ?  ?  ?  ?  ?  ?  ?  ?  ?  ?  ?  ?  ?General Comments: Able to follow commands, minimal verbalizations, continues to have flat affect, may have some left sided inattention, sister present to interpret ?  ?  ? ?  ?Exercises   ? ?  ?General Comments   ?  ?  ? ?Pertinent Vitals/Pain    ? ? ?  Home Living   ?  ?  ?  ?  ?  ?  ?  ?  ?  ?   ?  ?Prior Function    ?  ?  ?   ? ?PT Goals (current goals can now be found in the care plan section) Progress towards PT goals: Progressing toward goals ? ?  ?Frequency ? ? ? Min 3X/week ? ? ? ?  ?PT Plan Current plan remains appropriate  ? ? ?Co-evaluation PT/OT/SLP Co-Evaluation/Treatment: Yes ?Reason for Co-Treatment: For patient/therapist safety ?PT goals addressed during session: Mobility/safety with mobility ?OT goals addressed during session: ADL's and self-care ?  ? ?  ?AM-PAC PT "6 Clicks" Mobility   ?Outcome Measure ? Help needed turning from your back to  your side while in a flat bed without using bedrails?: A Lot ?Help needed moving from lying on your back to sitting on the side of a flat bed without using bedrails?: A Lot ?Help needed moving to and from a bed to a chair (including a wheelchair)?: Total ?Help needed standing up from a chair using your arms (e.g., wheelchair or bedside chair)?: Total ?Help needed to walk in hospital room?: Total ?Help needed climbing 3-5 steps with a railing? : Total ?6 Click Score: 8 ? ?  ?End of Session Equipment Utilized During Treatment: Gait belt ?Activity Tolerance: Patient limited by fatigue ?Patient left: in chair;with family/visitor present;with call bell/phone within reach ?Nurse Communication: Mobility status ?PT Visit Diagnosis: History of falling (Z91.81);Muscle weakness (generalized) (M62.81);Difficulty in walking, not elsewhere classified (R26.2);Pain;Adult, failure to thrive (R62.7);Other abnormalities of gait and mobility (R26.89) ?  ? ? ?Time: 8127-5170 ?PT Time Calculation (min) (ACUTE ONLY): 17 min ? ?Charges:     Visit only        ?          ? ?Tresa Endo PT ?Acute Rehabilitation Services ?Pager [phone] ?Office [phone] ? ? ? ?Iysha Mishkin, Shella Maxim ?01/01/2022, 1:52 PM ? ?

## 2022-01-01 NOTE — Progress Notes (Signed)
Occupational Therapy Treatment ?Patient Details ?Name: Patricia Stevenson ?MRN: [id number] ?DOB: 1960-09-09 ?Today's Date: 01/01/2022 ? ? ?History of present illness 61 yo female admitted with SIRS. She is currently undergoing chemo-radiation for brain cancer. Per family, has had a pretty quick decline over last week or so prior to this admission. Hx of craniotomy 08/2021, residual L sided weakness,  lap chole 10/22, Afib, glioblastoma, falls, ?  ?OT comments ? Patient required max assist to don socks - working on sitting tolerance, sitting balance, use of upper and lower extremities and engagement. Appears to have some left sided inattention. Plan is for patient to return home at discharge with family. They are getting a hospital bed.  ? ?Recommendations for follow up therapy are one component of a multi-disciplinary discharge planning process, led by the attending physician.  Recommendations may be updated based on patient status, additional functional criteria and insurance authorization. ?   ?Follow Up Recommendations ? Home health OT  ?  ?Assistance Recommended at Discharge Frequent or constant Supervision/Assistance  ?Patient can return home with the following ? A lot of help with bathing/dressing/bathroom;Assistance with cooking/housework;Direct supervision/assist for financial management;Assist for transportation;Help with stairs or ramp for entrance;Direct supervision/assist for medications management;Two people to help with walking and/or transfers ?  ?Equipment Recommendations ?  (Reports they are getting a hospital bed and they have a BSC)  ?  ?Recommendations for Other Services   ? ?  ?Precautions / Restrictions Precautions ?Precautions: Fall ?Precaution Comments: incontinent, L side weakness ?Restrictions ?Weight Bearing Restrictions: No  ? ? ?  ? ?Mobility Bed Mobility ?  ?  ?  ?  ?  ?  ?  ?  ?  ? ?Transfers ?  ?  ?  ?  ?  ?  ?  ?  ?  ?  ?  ?  ?Balance Overall balance assessment: Needs  assistance ?Sitting-balance support: No upper extremity supported, Feet supported ?Sitting balance-Leahy Scale: Fair ?  ?  ?  ?  ?  ?  ?  ?  ?  ?  ?  ?  ?  ?  ?  ?  ?   ? ?ADL either performed or assessed with clinical judgement  ? ?ADL Overall ADL's : Needs assistance/impaired ?  ?  ?  ?  ?  ?  ?  ?  ?Upper Body Dressing : Sitting ?  ?Lower Body Dressing: Sitting/lateral leans;Maximal assistance ?Lower Body Dressing Details (indicate cue type and reason): Worked on donning socks at edge of bed - grossly needed mod assist. She fatigued quickly, lost interest, LEs were weak and dropping and therapist had to assit with keeping legs positioned. ?  ?  ?  ?  ?  ?  ?Functional mobility during ADLs: Maximal assistance;+2 for physical assistance ?General ADL Comments: Max assist to transfer patient to side of bed. Patient required max assist to don socks - working on sitting tolerance, sitting balance, use of upper and lower extremities and engagement. Appears to have some left sided inattention. Sister in room encouraging patient to participate. ?  ? ?Extremity/Trunk Assessment   ?  ?  ?  ?  ?  ? ?Vision Patient Visual Report: No change from baseline ?  ?  ?Perception   ?  ?Praxis   ?  ? ?Cognition Arousal/Alertness: Awake/alert ?Behavior During Therapy: Flat affect ?Overall Cognitive Status: Difficult to assess ?  ?  ?  ?  ?  ?  ?  ?  ?  ?  ?  ?  ?  ?  ?  ?  ?  General Comments: Able to follow commands, minimal verbalizations, continues to have flat affect, may have some left sided inattention ?  ?  ?   ?Exercises   ? ?  ?Shoulder Instructions   ? ? ?  ?General Comments    ? ? ?Pertinent Vitals/ Pain       Pain Assessment ?Faces Pain Scale: No hurt ? ?Home Living   ?  ?  ?  ?  ?  ?  ?  ?  ?  ?  ?  ?  ?  ?  ?  ?  ?  ?  ? ?  ?Prior Functioning/Environment    ?  ?  ?  ?   ? ?Frequency ? Min 2X/week  ? ? ? ? ?  ?Progress Toward Goals ? ?OT Goals(current goals can now be found in the care plan section) ? Progress towards OT  goals: Progressing toward goals ? ?Acute Rehab OT Goals ?Patient Stated Goal: walk better in order to participate in ADLs ?OT Goal Formulation: With family ?Time For Goal Achievement: 01/11/22 ?Potential to Achieve Goals: Fair  ?Plan Discharge plan remains appropriate   ? ?Co-evaluation ? ? ? PT/OT/SLP Co-Evaluation/Treatment: Yes ?Reason for Co-Treatment: For patient/therapist safety;To address functional/ADL transfers;Necessary to address cognition/behavior during functional activity ?PT goals addressed during session: Mobility/safety with mobility ?OT goals addressed during session: ADL's and self-care ?  ? ?  ?AM-PAC OT "6 Clicks" Daily Activity     ?Outcome Measure ? ? Help from another person eating meals?: A Little ?Help from another person taking care of personal grooming?: A Little ?Help from another person toileting, which includes using toliet, bedpan, or urinal?: A Lot ?Help from another person bathing (including washing, rinsing, drying)?: A Lot ?Help from another person to put on and taking off regular upper body clothing?: A Lot ?Help from another person to put on and taking off regular lower body clothing?: A Lot ?6 Click Score: 14 ? ?  ?End of Session Equipment Utilized During Treatment: Gait belt;Rolling walker (2 wheels) ? ?OT Visit Diagnosis: Muscle weakness (generalized) (M62.81) ?  ?Activity Tolerance Patient limited by fatigue ?  ?Patient Left in chair;with call bell/phone within reach;with chair alarm set;with family/visitor present ?  ?Nurse Communication Mobility status ?  ? ?   ? ?Time: 2694-8546 ?OT Time Calculation (min): 17 min ? ?Charges: OT General Charges ?$OT Visit: 1 Visit ?OT Treatments ?$Self Care/Home Management : 8-22 mins ? ?Jonmichael Beadnell, OTR/L ?Acute Care Rehab Services  ?Office [phone] ?Pager: [phone]  ? ?[place] Svehla L Creasie Lacosse ?01/01/2022, 1:27 PM ?

## 2022-01-01 NOTE — Progress Notes (Signed)
?PROGRESS NOTE ? ?Patricia Stevenson:[id number] DOB: 04/14/1961  ? ?PCP: Isaac Bliss, Rayford Halsted, MD ? ?Patient is from: Home.  Lives with sister and her family ? ?DOA: 12/26/2021 LOS: 6 ? ?Chief complaints ?Chief Complaint  ?Patient presents with  ? Weakness  ?  ? ?Brief Narrative / Interim history: ?61 year old F with PMH of high-grade glioma s/p surgical resection with recent completion of concurrent chemoradiation with Temodar, paroxysmal A-fib not on AC, HTN, HLD and GERD presenting with generalized weakness admitted with possible severe sepsis/neutropenic fever and pancytopenia but no clear source of infection yet.  She had elevated lactic acid, hypothermia and bradycardia.  CT head without acute change but right temporal lobe encephalomalacia.  CT of abdomen and pelvis without acute finding.  Oncology and neuro oncology consulted.  ?Initially, she was started on broad-spectrum antibiotics.  However, no clear source of infection.  Infectious work-up unrevealing.  Antibiotics discontinued.   ? ?Patient started on Granix per oncology on 5/10.  Leukopenia improved.  Granix discontinued on 5/11.  Thrombocytopenia continued to get worse.  Getting platelet transfusion.  Oncology following. ? ?Therapy recommended home health and DME  ? ?Subjective: ?Seen and examined earlier this morning with the help of video interpreter with ID number [id number].  No major events overnight of this morning.  No complaints.  She denies pain, shortness of breath, GI or UTI symptoms. ? ?Objective: ?Vitals:  ? 01/01/22 0636 01/01/22 0702 01/01/22 0957 01/01/22 1312  ?BP: 114/83 111/77 113/69 121/81  ?Pulse:  69 69 65  ?Resp: '18 18 18 18  '$ ?Temp: 98 ?F (36.7 ?C) 98.1 ?F (36.7 ?C) 98.2 ?F (36.8 ?C) (!) 97.4 ?F (36.3 ?C)  ?TempSrc: Oral Oral Oral Oral  ?SpO2: 99% 98% 99% 100%  ?Weight:      ?Height:      ? ? ?Examination: ? ?GENERAL: No apparent distress.  Nontoxic. ?HEENT: MMM.  Vision and hearing grossly intact.  Healed craniotomy scar. ?NECK:  Supple.  No apparent JVD.  ?RESP:  No IWOB.  Fair aeration bilaterally. ?CVS:  RRR. Heart sounds normal.  ?ABD/GI/GU: BS+. Abd soft, NTND.  ?MSK/EXT:  Moves extremities. No apparent deformity. No edema.  ?SKIN: no apparent skin lesion or wound ?NEURO: Awake and alert. Oriented appropriately.  No apparent focal neuro deficit. ?PSYCH: Calm. Normal affect.  ? ?Procedures:  ?None ? ?Microbiology summarized: ?MRSA PCR screen negative. ?Blood culture NGTD ?Urine culture NGTD ? ?Assessment and Plan: ?Pancytopenia ([place]) ?Likely from chemotherapy.  Anemia stable.  Leukopenia improved after Granix on 5/10. S/p 1 apheresis of platelet on 5/7 and 5/12.  ?-Hematology/oncology following. ?-Transfuse for Hgb <7.5 and platelet < 20k ?-Nplate? ? 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 ?-Temodar on hold per oncology ?-Continue Keppra and Decadron ?-Seems to have flat affect.  May benefit from formal depression evaluation outpatient ? ?SIRS  ?Initially concern about severe sepsis but no clear source of infection.  Sepsis/severe sepsis ruled out.  Antibiotics discontinued ? ?Paroxysmal atrial fibrillation (HCC) ?Rate controlled on p.o. Cardizem.  CHA2DS2-VASc score 2 (sex and HTN). ?-Continue p.o. Cardizem ?-Defer anticoagulation until outpatient follow-up with cardiology ? ?Flat affect ?She is at risk for depression. Not an imminent danger to herself or others.  Seems to have normal affect today. ?-Recommended discussing with PCP at follow-up.  She could benefit from formal depression evaluation ? ?Physical deconditioning ?PT/OT ? ?Hypokalemia ?K3.5.  P.o. KCl 40x1 ? ?Hyponatremia ?Relatively stable.  Continue monitoring ? ?  Bradycardia ?Resolved. ? ?Hypertension ?Normotensive.  On p.o. Cardizem CD mainly for A-fib ? ? ? ?DVT prophylaxis:  ?SCDs Start: 12/26/21 2346 ? ?Code Status: Full code ?Family Communication: Updated patient's sister at bedside on 5/11.   None at bedside today. ?Level of care: Telemetry ?Status is: Inpatient ?Remains inpatient appropriate because: Due to pancytopenia with worsening thrombocytopenia requiring platelet transfusion ? ? ?Final disposition: Home once medically cleared ?Consultants:  ?Oncology ?Neurooncology ? ?Sch Meds:  ?Scheduled Meds: ? sodium chloride   Intravenous Once  ? dexamethasone  4 mg Oral BID WC  ? diphenhydrAMINE  25 mg Oral Once  ? levETIRAcetam  500 mg Oral BID  ? sodium chloride flush  3 mL Intravenous Q12H  ? ?Continuous Infusions: ?PRN Meds:.acetaminophen **OR** acetaminophen, dicyclomine, ondansetron **OR** ondansetron (ZOFRAN) IV, polyethylene glycol ? ?Antimicrobials: ?Anti-infectives (From admission, onward)  ? ? Start     Dose/Rate Route Frequency Ordered Stop  ? 12/28/21 0200  vancomycin (VANCOCIN) IVPB 1000 mg/200 mL premix  Status:  Discontinued       ? 1,000 mg ?200 mL/hr over 60 Minutes Intravenous Every 24 hours 12/27/21 0723 12/28/21 1626  ? 12/27/21 1000  fluconazole (DIFLUCAN) tablet 100 mg  Status:  Discontinued       ?Note to Pharmacy: Take 2 tablets on day 1, then 1 tablet daily    ? 100 mg Oral Daily 12/26/21 2351 12/28/21 1626  ? 12/27/21 0800  piperacillin-tazobactam (ZOSYN) IVPB 3.375 g       ? 3.375 g ?12.5 mL/hr over 240 Minutes Intravenous Every 8 hours 12/27/21 0723 12/29/21 2055  ? 12/27/21 0000  vancomycin (VANCOCIN) IVPB 1000 mg/200 mL premix       ? 1,000 mg ?200 mL/hr over 60 Minutes Intravenous  Once 12/26/21 2352 12/27/21 0314  ? 12/27/21 0000  piperacillin-tazobactam (ZOSYN) IVPB 3.375 g       ? 3.375 g ?100 mL/hr over 30 Minutes Intravenous  Once 12/26/21 2352 12/27/21 0134  ? ?  ? ? ? ?I have personally reviewed the following labs and images: ?CBC: ?Recent Labs  ?Lab 12/26/21 ?2058 12/27/21 ?0559 12/27/21 ?2030 12/28/21 ?0416 12/30/21 ?0174 12/31/21 ?9449 01/01/22 ?0444  ?WBC 1.9* 1.8*  --  1.7* 1.3* 2.8* 2.8*  ?NEUTROABS 1.7 1.6*  --  1.5* 1.2* 2.5  --   ?HGB 11.6* 10.8*  --  10.5*  10.7* 10.6* 10.7*  ?HCT 32.6* 29.9*  --  29.7* 30.9* 30.9* 31.4*  ?MCV 91.6 92.0  --  92.5 94.2 94.5 95.2  ?PLT 20* 18* 60* 43* 30* 25* 18*  ? ?BMP &GFR ?Recent Labs  ?Lab 12/28/21 ?0416 12/29/21 ?0447 12/30/21 ?6759 12/31/21 ?1638 01/01/22 ?0444  ?NA 135 134* 136 132* 132*  ?K 3.5 3.4* 3.5 3.7 3.5  ?CL 100 100 102 102 103  ?CO2 '26 25 26 '$ 20* 20*  ?GLUCOSE 151* 136* 121* 145* 172*  ?BUN 16 17 23* 16 17  ?CREATININE 0.50 0.42* 0.37* 0.37* 0.38*  ?CALCIUM 8.3* 8.1* 8.1* 8.0* 7.7*  ?MG 2.4 2.3 2.4 2.3 2.2  ?PHOS  --   --   --  2.4* 2.5  ? ?Estimated Creatinine Clearance: 55.1 mL/min (A) (by C-G formula based on SCr of 0.38 mg/dL (L)). ?Liver & Pancreas: ?Recent Labs  ?Lab 12/26/21 ?2058 12/27/21 ?0559 12/28/21 ?4665 12/29/21 ?9935 12/31/21 ?7017 01/01/22 ?0444  ?AST '25 21 22 26  '$ --   --   ?ALT 80* 69* 66* 86*  --   --   ?ALKPHOS 53 41 40 44  --   --   ?  BILITOT 0.6 0.5 0.8 0.5  --   --   ?PROT 5.2* 4.7* 4.9* 4.9*  --   --   ?ALBUMIN 2.9* 2.6* 2.6* 2.6* 2.7* 2.6*  ? ?No results for input(s): LIPASE, AMYLASE in the last 168 hours. ?No results for input(s): AMMONIA in the last 168 hours. ?Diabetic: ?No results for input(s): HGBA1C in the last 72 hours. ?No results for input(s): GLUCAP in the last 168 hours. ?Cardiac Enzymes: ?No results for input(s): CKTOTAL, CKMB, CKMBINDEX, TROPONINI in the last 168 hours. ?No results for input(s): PROBNP in the last 8760 hours. ?Coagulation Profile: ?Recent Labs  ?Lab 12/27/21 ?0559 12/27/21 ?2030 12/28/21 ?0416  ?INR 1.0 1.0 1.0  ? ?Thyroid Function Tests: ?No results for input(s): TSH, T4TOTAL, FREET4, T3FREE, THYROIDAB in the last 72 hours. ?Lipid Profile: ?No results for input(s): CHOL, HDL, LDLCALC, TRIG, CHOLHDL, LDLDIRECT in the last 72 hours. ?Anemia Panel: ?No results for input(s): VITAMINB12, FOLATE, FERRITIN, TIBC, IRON, RETICCTPCT in the last 72 hours. ?Urine analysis: ?   ?Component Value Date/Time  ? COLORURINE STRAW (A) 12/26/2021 0019  ? APPEARANCEUR CLEAR 12/26/2021 0019   ? LABSPEC 1.026 12/26/2021 0019  ? PHURINE 8.0 12/26/2021 0019  ? GLUCOSEU 50 (A) 12/26/2021 0019  ? [place] NEGATIVE 12/26/2021 0019  ? [place] NEGATIVE 12/26/2021 0019  ? KETONESUR NEGATIVE 05/06/2

## 2022-01-02 DIAGNOSIS — I480 Paroxysmal atrial fibrillation: Secondary | ICD-10-CM | POA: Diagnosis not present

## 2022-01-02 DIAGNOSIS — R6510 Systemic inflammatory response syndrome (SIRS) of non-infectious origin without acute organ dysfunction: Secondary | ICD-10-CM | POA: Diagnosis not present

## 2022-01-02 DIAGNOSIS — C719 Malignant neoplasm of brain, unspecified: Secondary | ICD-10-CM | POA: Diagnosis not present

## 2022-01-02 DIAGNOSIS — D61818 Other pancytopenia: Secondary | ICD-10-CM | POA: Diagnosis not present

## 2022-01-02 DIAGNOSIS — R4589 Other symptoms and signs involving emotional state: Secondary | ICD-10-CM

## 2022-01-02 DIAGNOSIS — E871 Hypo-osmolality and hyponatremia: Secondary | ICD-10-CM

## 2022-01-02 LAB — PREPARE PLATELET PHERESIS: Unit division: 0

## 2022-01-02 LAB — RENAL FUNCTION PANEL
Albumin: 2.7 g/dL — ABNORMAL LOW (ref 3.5–5.0)
Anion gap: 9 (ref 5–15)
BUN: 15 mg/dL (ref 6–20)
CO2: 21 mmol/L — ABNORMAL LOW (ref 22–32)
Calcium: 8 mg/dL — ABNORMAL LOW (ref 8.9–10.3)
Chloride: 102 mmol/L (ref 98–111)
Creatinine, Ser: 0.43 mg/dL — ABNORMAL LOW (ref 0.44–1.00)
GFR, Estimated: >60 mL/min (ref >60)
Glucose, Bld: 164 mg/dL — ABNORMAL HIGH (ref 70–99)
Phosphorus: 2.6 mg/dL (ref 2.5–4.6)
Potassium: 3.5 mmol/L (ref 3.5–5.1)
Sodium: 132 mmol/L — ABNORMAL LOW (ref 135–145)

## 2022-01-02 LAB — MAGNESIUM: Magnesium: 2.4 mg/dL (ref 1.7–2.4)

## 2022-01-02 LAB — CBC
HCT: 29.9 % — ABNORMAL LOW (ref 36.0–46.0)
Hemoglobin: 10.6 g/dL — ABNORMAL LOW (ref 12.0–15.0)
MCH: 33.1 pg (ref 26.0–34.0)
MCHC: 35.5 g/dL (ref 30.0–36.0)
MCV: 93.4 fL (ref 80.0–100.0)
Platelets: 43 10*3/uL — ABNORMAL LOW (ref 150–400)
RBC: 3.2 MIL/uL — ABNORMAL LOW (ref 3.87–5.11)
RDW: 16.4 % — ABNORMAL HIGH (ref 11.5–15.5)
WBC: 2.4 10*3/uL — ABNORMAL LOW (ref 4.0–10.5)
nRBC: 0 % (ref 0.0–0.2)

## 2022-01-02 LAB — BPAM PLATELET PHERESIS
Blood Product Expiration Date: 202305142359
ISSUE DATE / TIME: 202305120643
Unit Type and Rh: 5100

## 2022-01-02 NOTE — Progress Notes (Signed)
1400 Discharge instructions reviewed with patient's husband [name]. Daughter on the phone as well. Encouraged to call this RN today with any other questions-phone number provided. IV removed with no issue.  ? ?1645 Patient picked up by ambulette. Husband collected all belongings. Discharged home. ?

## 2022-01-02 NOTE — TOC Progression Note (Signed)
Transition of Care (TOC) - Progression Note  ? ? ?Patient Details  ?Name: Patricia Stevenson ?MRN: [id number] ?Date of Birth: July 14, 1961 ? ?Transition of Care (TOC) CM/SW Contact  ?Ross Ludwig, LCSW ?Phone Number: ?01/02/2022, 12:35 PM ? ?Clinical Narrative:    ? ?CSW received message that patient's daughter Patricia Stevenson has not received equipment yet.  CSW contacted [employer], and per Delana Meyer, the DME company tried reaching out to patient's family on the 10th and the 12th, but did not get anyone to answer the phone, so [employer] was going to deliver on Monday.  CSW was informed that patient is ready for discharge today, CSW contacted [employer] again and informed them that patient will be discharging today, and will EMS transport set up once equipment is delivered.  Per [facility] patient's equipment can be arranged to be delivered today.  CSW updated patient's daughter and asked her to call CSW in an hour if she does not hear anything. ? ?CSW spoke to patient's daughter Patricia Stevenson, and asked if equipment has been delivered yet.  Per daughter she has not receive a phone call regarding delivery of hospital bed and wheelchair.  CSW followed up with [employer], per [facility], equipment has been ordered, the tech has not reached out to family yet, because they don't have an estimated time for delivery yet, but will contact daughter.  CSW updated patient's daughter, and asked her to call CSW once equipment has been delivered.   ? ?Expected Discharge Plan: [place] ?Barriers to Discharge: Continued Medical Work up ? ?Expected Discharge Plan and Services ?Expected Discharge Plan: [place] ?  ?Discharge Planning Services: CM Consult ?Post Acute Care Choice: Home Health ?Living arrangements for the past 2 months: [place] ?Expected Discharge Date: 01/02/22               ?DME Arranged: Youth worker wheelchair with seat cushion, Hospital bed ?DME Agency: [employer] ?Date DME  Agency Contacted: 12/29/21 ?Time DME Agency Contacted: [phone] ?Representative spoke with at DME Agency: Andee Poles ?HH Arranged: PT, OT, Social Work ?[place] Agency: [place] ?Date HH Agency Contacted: 12/29/21 ?Time [place]: [id number] ?Representative spoke with at [place]: Amy ? ? ?Social Determinants of Health (SDOH) Interventions ?  ? ?Readmission Risk Interventions ?   ? View : No data to display.  ?  ?  ?  ? ? ?

## 2022-01-02 NOTE — TOC Transition Note (Signed)
Transition of Care (TOC) - CM/SW Discharge Note ? ? ?Patient Details  ?Name: Patricia Stevenson ?MRN: [id number] ?Date of Birth: 03/21/1961 ? ?Transition of Care (TOC) CM/SW Contact:  ?Ross Ludwig, LCSW ?Phone Number: ?01/02/2022, 1:45 PM ? ? ?Clinical Narrative:    ? ?CSW received phone call from patient's daughter [name].  She received a phone call from [place], and they said equipment should be delivered within an hour.  CSW updated bedside nurse, and per patient's daughter, she will need EMS transport back home.  CSW to contact EMS to arrange transport.  TOC signing off, please reconsult if other TOC needs arise. ? ? ?Final next level of care: Home/Self Care ?Barriers to Discharge: Barriers Resolved ? ? ?Patient Goals and CMS Choice ?Patient states their goals for this hospitalization and ongoing recovery are:: To return back home. ?CMS Medicare.gov Compare Post Acute Care list provided to:: Patient Represenative (must comment) ?Choice offered to / list presented to : Adult Children ? ?Discharge Placement ?  ?           ?  ?Patient to be transferred to facility by: [facility] ?  ?Patient and family notified of of transfer: 01/02/22 ? ?Discharge Plan and Services ?  ?Discharge Planning Services: CM Consult ?Post Acute Care Choice: Home Health          ?DME Arranged: Hospital bed, Wheelchair manual ?DME Agency: [employer] ?Date DME Agency Contacted: 01/02/22 ?Time DME Agency Contacted: [phone] ?Representative spoke with at DME Agency: Delana Meyer ?HH Arranged: PT, OT, Social Work ?[place] Agency: [place] ?Date HH Agency Contacted: 12/29/21 ?Time [place]: [phone] ?Representative spoke with at [place]: [place] ? ?Social Determinants of Health (SDOH) Interventions ?  ? ? ?Readmission Risk Interventions ?   ? View : No data to display.  ?  ?  ?  ? ? ? ? ? ?

## 2022-01-02 NOTE — Discharge Summary (Signed)
? ?Physician Discharge Summary  ?Patricia Stevenson RXV:[id number] DOB: Sep 27, 1960 DOA: 12/26/2021 ? ?PCP: Isaac Bliss, Rayford Halsted, MD ? ?Admit date: 12/26/2021 ?Discharge date: 01/02/2022 ?Admitted From: Home ?Disposition:  Home ?Recommendations for Outpatient Follow-up:  ?Follow ups as below. ?Please obtain CBC/BMP/Mag at follow up ?Please follow up on the following pending results: none ? ?Home Health: PT/OT ?Equipment/Devices: Hospital bed and wheelchair ? ?Discharge Condition: Stable but guarded prognosis ?CODE STATUS: Full code ? Follow-up Information   ? ? [facility]. Follow up.   ?Why: [facility] physical therapy/occupational therapy/social worker ?Contact information: ?[address] ?[place] [address] ?[phone] ? ? ?  ?  ? ? Isaac Bliss, Rayford Halsted, MD. Schedule an appointment as soon as possible for a visit in 1 week(s).   ?Specialty: Internal Medicine ?Contact information: ?[address] ?[place] [address] ?[phone] ? ? ?  ?  ? ? Janina Mayo, MD .   ?Specialty: Cardiology ?Contact information: ?[address] ?[place] [address] ?[phone] ? ? ?  ?  ? ?  ?  ? ?  ? ? ?Hospital course ?61 year old F with PMH of high-grade glioma s/p surgical resection with recent completion of concurrent chemoradiation with Temodar, paroxysmal A-fib not on AC, HTN, HLD and GERD presenting with generalized weakness admitted with possible severe sepsis/neutropenic fever and pancytopenia but no clear source of infection yet.  She had elevated lactic acid, hypothermia and bradycardia.  CT head without acute change but right temporal lobe encephalomalacia.  CT of abdomen and pelvis without acute finding.  Oncology and neuro oncology consulted.  ?Initially, she was started on broad-spectrum antibiotics.  However, no clear source of infection.  Infectious work-up unrevealing.  Antibiotics discontinued.   ? ?Patient started on Granix per oncology on 5/10.  Leukopenia  improved.  Granix discontinued on 5/11.  Thrombocytopenia continued to get worse.  Getting platelet transfusion.  Oncology following. ? ?Therapy recommended home health and DME  ? ?See individual problem list below for more on hospital course. ? 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  ?Assessment and Plan: ?Pancytopenia ([place]) ?Likely from chemotherapy.  Anemia stable.  Leukopenia improved after Granix on 5/10. S/p 1 apheresis of platelet on 5/7 and 5/12. Plt improved to 43k ?-Repeat CBC at follow-up. ?-Oncology follow-up ? ?High grade glioma not classifiable by WHO criteria ([place]) ?S/p surgical resection and chemoradiation with Temodar. ?-Temodar on hold per oncology ?-Continue Keppra and Decadron ?-Seems to have flat affect.  May benefit from formal depression evaluation outpatient ? ?SIRS  ?Initially concern about severe sepsis but no clear source of infection.  Sepsis/severe sepsis ruled out.  Antibiotics discontinued ? ?Paroxysmal atrial fibrillation (HCC) ?Cardizem discontinued due to bradycardia.  Remains in NSR with HR in 60s.  CHA2DS2-VASc score 2 (sex and HTN). ?Outpatient follow-up with cardiology ? ?Flat affect ?She is at risk for depression. Not an imminent danger to herself or others.  Seems to have normal affect today. ?-Recommended discussing with PCP at follow-up.  She could benefit from formal depression evaluation ? ?Physical deconditioning ?HH PT/OT and DME's ordered. ? ?Hypokalemia ?Resolved. ? ?Hyponatremia ?Relatively stable.  Continue monitoring ? ?Bradycardia ?Resolved. ? ?Hypertension ?Normotensive off Cardizem. ? ? ? ?Vital signs ?Vitals:  ? 01/01/22 1312 01/01/22  1531 01/02/22 0408 01/02/22 1338  ?BP: 121/81 112/79 108/67 104/67  ?Pulse: 65 77 (!)  59 85  ?Temp: (!) 97.4 ?F (36.3 ?C)  98.6 ?F (37 ?C) 98.2 ?F (36.8 ?C)  ?Resp: '18 18 18 '$ (!) 22  ?Height:      ?Weight:      ?SpO2: 100% 99% 98% 99%  ?TempSrc: Oral  Oral Oral  ?BMI (Calculated):      ?  ? ?Discharge exam ? ?GENERAL: No apparent distress.  Nontoxic. ?HEENT: MMM.  Vision and hearing grossly intact.  ?NECK: Supple.  No apparent JVD.  ?RESP:  No IWOB.  Fair aeration bilaterally. ?CVS:  RRR. Heart sounds normal.  ?ABD/GI/GU: BS+. Abd soft, NTND.  ?MSK/EXT:  Moves extremities. No apparent deformity. No edema.  ?SKIN: no apparent skin lesion or wound ?NEURO: Awake and alert. Oriented appropriately.  No apparent focal neuro deficit. ?PSYCH: Calm. Normal affect.  Appears quieter ? ?Discharge Instructions ?Discharge Instructions   ? ? Call MD for:  difficulty breathing, headache or visual disturbances   Complete by: As directed ?  ? Call MD for:  extreme fatigue   Complete by: As directed ?  ? Call MD for:  persistant dizziness or light-headedness   Complete by: As directed ?  ? Call MD for:  persistant nausea and vomiting   Complete by: As directed ?  ? Diet general   Complete by: As directed ?  ? Discharge instructions   Complete by: As directed ?  ? It has been a pleasure taking care of you! ? ?You were hospitalized due to generalized weakness and low blood cell counts likely from the chemotherapy.  Initially, there was concern about infection that felt to be less likely after the tests we have run.  Your blood cell counts improved.  We are discharging you to follow-up with the oncology and primary care doctor.  Know that we have also stopped your Cardizem due to low heart rate which might have also contributed to your weakness. ? ?Please review your new medication list and the directions on your medications before you take them. ? ? ?Take care,  ? Increase activity slowly   Complete by: As directed ?  ? ?  ? ?Allergies as of 01/02/2022   ? ?   Reactions  ? Latex Rash  ? ?  ? ?  ?Medication List  ?  ? ?STOP  taking these medications   ? ?diltiazem 120 MG 24 hr capsule ?Commonly known as: CARDIZEM CD ?  ? ?  ? ?TAKE these medications   ? ?acetaminophen 500 MG tablet ?Commonly known as: TYLENOL ?Take 1,000 mg by mouth every 6 (six) hours as needed for moderate pain or headache. ?  ?calcium carbonate 1500 (600 Ca) MG Tabs tablet ?Commonly known as: OSCAL ?Take 1,500 mg by mouth 2 (two) times daily with a meal. ?  ?dexamethasone 4 MG tablet ?Commonly known as: DECADRON ?Take 1 tablet (4 mg total) by mouth 2 (two) times daily with a meal. ?  ?dicyclomine 10 MG capsule ?Commonly known as: BENTYL ?Take 1 capsule (10 mg total) by mouth in the morning and at bedtime. As needed ?What changed:  ?when to take this ?reasons to take this ?additional instructions ?  ?famotidine 20 MG tablet ?Commonly known as: Pepcid ?Take 1 tablet (20 mg total) by mouth 2 (two) times daily. ?  ?levETIRAcetam 500 MG tablet ?Commonly known as: Keppra ?Take 1 tablet (500 mg total) by mouth 2 (two) times daily. ?  ?  ondansetron 8 MG tablet ?Commonly known as: Zofran ?Take 1 tablet (8 mg total) by mouth 2 (two) times daily as needed (nausea and vomiting). May take 30-60 minutes prior to Temodar administration if nausea/vomiting occurs. ?  ?VITA-C PO ?Take 500 mg by mouth daily. Unsure of dose ?  ?VITAMIN D PO ?Take 1,000 Units by mouth daily. ?  ? ?  ? ?  ?  ? ? ?  ?Durable Medical Equipment  ?(From admission, onward)  ?  ? ? ?  ? ?  Start     Ordered  ? 12/29/21 1617  For home use only DME standard manual wheelchair with seat cushion  Once       ?Comments: Patient suffers from weakness which impairs their ability to perform daily activities like bathing, dressing, feeding, grooming, and toileting in the home.  A walker will not resolve issue with performing activities of daily living. A wheelchair will allow patient to safely perform daily activities. Patient can safely propel the wheelchair in the home or has a caregiver who can provide assistance.  Length of need Lifetime. ?Accessories: elevating leg rests (ELRs), wheel locks, extensions and anti-tippers.  ? 12/29/21 1617  ? 12/29/21 1458  For home use only DME Hospital bed  Once       ?Question Answer Comm

## 2022-01-03 LAB — BPAM PLATELET PHERESIS
Blood Product Expiration Date: 202305142359
Unit Type and Rh: 5100

## 2022-01-03 LAB — PREPARE PLATELET PHERESIS: Unit division: 0

## 2022-01-04 ENCOUNTER — Telehealth: Payer: Self-pay

## 2022-01-04 ENCOUNTER — Other Ambulatory Visit: Payer: Self-pay | Admitting: *Deleted

## 2022-01-04 ENCOUNTER — Telehealth: Payer: Self-pay | Admitting: *Deleted

## 2022-01-04 DIAGNOSIS — T451X5A Adverse effect of antineoplastic and immunosuppressive drugs, initial encounter: Secondary | ICD-10-CM

## 2022-01-04 NOTE — Telephone Encounter (Signed)
Per daughter [name] ? ?Transition Care Management Follow-up Telephone Call ?Date of discharge and from where: 01/02/2022 Patricia Stevenson ?How have you been since you were released from the hospital? Not well, immobile ?Any questions or concerns? No ? ?Items Reviewed: ?Did the pt receive and understand the discharge instructions provided? Yes  ?Medications obtained and verified? Yes  ?Other? No  ?Any new allergies since your discharge? No  ?Dietary orders reviewed? Yes ?Do you have support at home? Yes  ? ?Home Care and Equipment/Supplies: ?Were home health services ordered? yes ?If so, what is the name of the agency? [facility] Care  ?Has the agency set up a time to come to the patient's home? yes ?Were any new equipment or medical supplies ordered?  Yes: hospital bed and wheelchair ?What is the name of the medical supply agency?  ?Were you able to get the supplies/equipment? yes ?Do you have any questions related to the use of the equipment or supplies? No ? ?Functional Questionnaire: (I = Independent and D = Dependent) ?ADLs: D ? ?Bathing/Dressing- D ? ?Meal Prep- D ? ?Eating- D ? ?Maintaining continence- D ? ?Transferring/Ambulation- D ? ?Managing Meds- D ? ?Follow up appointments reviewed: ? ?PCP Hospital f/u appt confirmed? No  Will reach out to front desk needs a virtual next Thurs/ Fri ?Are transportation arrangements needed? No  ?If their condition worsens, is the pt aware to call PCP or go to the Emergency Dept.? Yes ?Was the patient provided with contact information for the PCP's office or ED? Yes ?Was to pt encouraged to call back with questions or concerns? Yes ? ?

## 2022-01-04 NOTE — Telephone Encounter (Signed)
Patients daughter called.   Patient was discharged home with home health but that doesn't start until tomorrow.  Patient is due to see Dr Mickeal Skinner and Dr Burr Medico with labs prior.   ? ?Daughter states that patient is unable to get out of the home. She lives in apartment on second floor.  She is pretty much bedbound and unable to coordinate PTAR at this time.  Daughter wanted to know how to proceed with visits that were planned for tomorrow.   ? ?Per Dr Mickeal Skinner ok to order [place] with labs for CBC/CMP to be done with existing home health agency [facility] [phone] (fax [phone]).  He will proceed with phone visit with patient/daughter tomorrow.   ? ?Dr Burr Medico needs to reschedule visit until labs are resulted. ? ? ?

## 2022-01-05 ENCOUNTER — Inpatient Hospital Stay (HOSPITAL_BASED_OUTPATIENT_CLINIC_OR_DEPARTMENT_OTHER): Payer: 59 | Admitting: Internal Medicine

## 2022-01-05 ENCOUNTER — Inpatient Hospital Stay: Payer: 59 | Admitting: Hematology

## 2022-01-05 ENCOUNTER — Inpatient Hospital Stay: Payer: 59

## 2022-01-05 ENCOUNTER — Telehealth: Payer: Self-pay | Admitting: Internal Medicine

## 2022-01-05 DIAGNOSIS — E871 Hypo-osmolality and hyponatremia: Secondary | ICD-10-CM | POA: Diagnosis not present

## 2022-01-05 DIAGNOSIS — C719 Malignant neoplasm of brain, unspecified: Secondary | ICD-10-CM

## 2022-01-05 DIAGNOSIS — D61818 Other pancytopenia: Secondary | ICD-10-CM | POA: Diagnosis not present

## 2022-01-05 DIAGNOSIS — A419 Sepsis, unspecified organism: Secondary | ICD-10-CM | POA: Diagnosis not present

## 2022-01-05 DIAGNOSIS — R6520 Severe sepsis without septic shock: Secondary | ICD-10-CM | POA: Diagnosis not present

## 2022-01-05 DIAGNOSIS — D709 Neutropenia, unspecified: Secondary | ICD-10-CM | POA: Diagnosis not present

## 2022-01-05 DIAGNOSIS — R001 Bradycardia, unspecified: Secondary | ICD-10-CM | POA: Diagnosis not present

## 2022-01-05 NOTE — Telephone Encounter (Signed)
Patricia Stevenson from [facility] call and stated she need a verbal order for PT twice a wk for 4 wk's and 1 x a wk for 5 wk's.Patricia Stevenson 's # is [phone]. ?

## 2022-01-05 NOTE — Progress Notes (Signed)
I connected with Patricia Stevenson on 01/05/22 at 12:00 PM EDT by telephone visit and verified that I am speaking with the correct person using two identifiers.  ?I discussed the limitations, risks, security and privacy concerns of performing an evaluation and management service by telemedicine and the availability of in-person appointments. I also discussed with the patient that there may be a patient responsible charge related to this service. The patient expressed understanding and agreed to proceed.  ?Other persons participating in the visit and their role in the encounter:  daughter, [name]  ?Patient's location:  Home  ?Provider's location:  Office  ?Chief Complaint:  High grade glioma not classifiable by WHO criteria ([place]) ? ?History of Present Ilness: Patricia Stevenson has unfortunately continued to decline.  She is mostly laying in bed, not able to take of any of her own needs.  Family has been there to dress, feed, and bathe.  She is wearing depends.  She was unable to come in for her appointment today in person. ?Observations: Language and cognition notable for impairments in attention, multi step reasoning ? ?Assessment and Plan: High grade glioma not classifiable by WHO criteria ([place]) ? ?Today we discussed goals of care.  She is not a candidate for systemic therapy at this time due to very poor functional status and advanced cytopenias.  We reviewed eligibility for hospice services, in home.   ? ?She will meet with her family over the next few days to decide which direction she wants to proceed in.  Primary point of contact will be her daughter, [name]. ? ?In the meantime she may remain on prior medications, including decadron '4mg'$  BID. ? ?Follow Up Instructions: She/Patricia Stevenson will call us in the next few days with a goals of care decision. ? ?I discussed the assessment and treatment plan with the patient.  The patient was provided an opportunity to ask questions and all were answered.  The patient agreed with  the plan and demonstrated understanding of the instructions.   ? ?The patient was advised to call back or seek an in-person evaluation if the symptoms worsen or if the condition fails to improve as anticipated.  I provided 5-10 minutes of non-face-to-face time during this enocunter. ? ?Ventura Sellers, MD ? ? ?I provided 22 minutes of non face-to-face telephone visit time during this encounter, and > 50% was spent counseling as documented under my assessment & plan.  ? ?

## 2022-01-06 ENCOUNTER — Inpatient Hospital Stay (HOSPITAL_COMMUNITY): Payer: 59

## 2022-01-06 ENCOUNTER — Ambulatory Visit: Payer: 59 | Admitting: Physical Therapy

## 2022-01-06 ENCOUNTER — Encounter: Payer: 59 | Admitting: Occupational Therapy

## 2022-01-06 ENCOUNTER — Encounter (HOSPITAL_COMMUNITY): Payer: Self-pay | Admitting: Pharmacy Technician

## 2022-01-06 ENCOUNTER — Inpatient Hospital Stay (HOSPITAL_COMMUNITY)
Admission: EM | Admit: 2022-01-06 | Discharge: 2022-01-13 | DRG: 808 | Disposition: A | Discharge status: Home Health | Payer: 59 | Attending: Internal Medicine | Admitting: Internal Medicine

## 2022-01-06 ENCOUNTER — Emergency Department (HOSPITAL_COMMUNITY): Payer: 59

## 2022-01-06 ENCOUNTER — Other Ambulatory Visit: Payer: Self-pay

## 2022-01-06 ENCOUNTER — Encounter: Payer: Self-pay | Admitting: Internal Medicine

## 2022-01-06 DIAGNOSIS — R6510 Systemic inflammatory response syndrome (SIRS) of non-infectious origin without acute organ dysfunction: Secondary | ICD-10-CM | POA: Diagnosis present

## 2022-01-06 DIAGNOSIS — G9341 Metabolic encephalopathy: Secondary | ICD-10-CM | POA: Diagnosis present

## 2022-01-06 DIAGNOSIS — E876 Hypokalemia: Secondary | ICD-10-CM | POA: Diagnosis present

## 2022-01-06 DIAGNOSIS — R233 Spontaneous ecchymoses: Secondary | ICD-10-CM | POA: Diagnosis present

## 2022-01-06 DIAGNOSIS — R001 Bradycardia, unspecified: Secondary | ICD-10-CM | POA: Diagnosis present

## 2022-01-06 DIAGNOSIS — N23 Unspecified renal colic: Secondary | ICD-10-CM | POA: Diagnosis present

## 2022-01-06 DIAGNOSIS — T451X5A Adverse effect of antineoplastic and immunosuppressive drugs, initial encounter: Secondary | ICD-10-CM

## 2022-01-06 DIAGNOSIS — I480 Paroxysmal atrial fibrillation: Secondary | ICD-10-CM | POA: Diagnosis present

## 2022-01-06 DIAGNOSIS — N10 Acute tubulo-interstitial nephritis: Secondary | ICD-10-CM | POA: Diagnosis present

## 2022-01-06 DIAGNOSIS — R109 Unspecified abdominal pain: Secondary | ICD-10-CM | POA: Diagnosis present

## 2022-01-06 DIAGNOSIS — E785 Hyperlipidemia, unspecified: Secondary | ICD-10-CM | POA: Diagnosis present

## 2022-01-06 DIAGNOSIS — Z9049 Acquired absence of other specified parts of digestive tract: Secondary | ICD-10-CM

## 2022-01-06 DIAGNOSIS — D61818 Other pancytopenia: Principal | ICD-10-CM | POA: Diagnosis present

## 2022-01-06 DIAGNOSIS — E559 Vitamin D deficiency, unspecified: Secondary | ICD-10-CM | POA: Diagnosis present

## 2022-01-06 DIAGNOSIS — I10 Essential (primary) hypertension: Secondary | ICD-10-CM | POA: Diagnosis present

## 2022-01-06 DIAGNOSIS — Z7189 Other specified counseling: Secondary | ICD-10-CM | POA: Diagnosis not present

## 2022-01-06 DIAGNOSIS — Z91040 Latex allergy status: Secondary | ICD-10-CM

## 2022-01-06 DIAGNOSIS — R531 Weakness: Secondary | ICD-10-CM | POA: Diagnosis not present

## 2022-01-06 DIAGNOSIS — R319 Hematuria, unspecified: Secondary | ICD-10-CM | POA: Diagnosis present

## 2022-01-06 DIAGNOSIS — Z7401 Bed confinement status: Secondary | ICD-10-CM

## 2022-01-06 DIAGNOSIS — Z515 Encounter for palliative care: Secondary | ICD-10-CM

## 2022-01-06 DIAGNOSIS — R4589 Other symptoms and signs involving emotional state: Secondary | ICD-10-CM | POA: Diagnosis present

## 2022-01-06 DIAGNOSIS — K219 Gastro-esophageal reflux disease without esophagitis: Secondary | ICD-10-CM | POA: Diagnosis present

## 2022-01-06 DIAGNOSIS — N390 Urinary tract infection, site not specified: Secondary | ICD-10-CM

## 2022-01-06 DIAGNOSIS — I823 Embolism and thrombosis of renal vein: Secondary | ICD-10-CM | POA: Diagnosis not present

## 2022-01-06 DIAGNOSIS — C719 Malignant neoplasm of brain, unspecified: Secondary | ICD-10-CM | POA: Diagnosis not present

## 2022-01-06 DIAGNOSIS — R627 Adult failure to thrive: Secondary | ICD-10-CM | POA: Diagnosis present

## 2022-01-06 DIAGNOSIS — R4182 Altered mental status, unspecified: Secondary | ICD-10-CM | POA: Diagnosis not present

## 2022-01-06 DIAGNOSIS — R0989 Other specified symptoms and signs involving the circulatory and respiratory systems: Secondary | ICD-10-CM | POA: Diagnosis not present

## 2022-01-06 DIAGNOSIS — E871 Hypo-osmolality and hyponatremia: Secondary | ICD-10-CM | POA: Diagnosis present

## 2022-01-06 DIAGNOSIS — Z79899 Other long term (current) drug therapy: Secondary | ICD-10-CM

## 2022-01-06 DIAGNOSIS — D701 Agranulocytosis secondary to cancer chemotherapy: Secondary | ICD-10-CM

## 2022-01-06 DIAGNOSIS — R0602 Shortness of breath: Secondary | ICD-10-CM

## 2022-01-06 DIAGNOSIS — D696 Thrombocytopenia, unspecified: Secondary | ICD-10-CM | POA: Diagnosis not present

## 2022-01-06 DIAGNOSIS — D72819 Decreased white blood cell count, unspecified: Secondary | ICD-10-CM | POA: Diagnosis not present

## 2022-01-06 DIAGNOSIS — R5381 Other malaise: Secondary | ICD-10-CM | POA: Diagnosis present

## 2022-01-06 LAB — URINALYSIS, ROUTINE W REFLEX MICROSCOPIC
Bilirubin Urine: NEGATIVE
Glucose, UA: NEGATIVE mg/dL
Ketones, ur: NEGATIVE mg/dL
Leukocytes,Ua: NEGATIVE
Nitrite: NEGATIVE
Protein, ur: NEGATIVE mg/dL
RBC / HPF: >50 RBC/hpf — ABNORMAL HIGH (ref 0–5)
Specific Gravity, Urine: 1.009 (ref 1.005–1.030)
pH: 8 (ref 5.0–8.0)

## 2022-01-06 LAB — COMPREHENSIVE METABOLIC PANEL
ALT: 69 U/L — ABNORMAL HIGH (ref 0–44)
AST: 31 U/L (ref 15–41)
Albumin: 3.4 g/dL — ABNORMAL LOW (ref 3.5–5.0)
Alkaline Phosphatase: 57 U/L (ref 38–126)
Anion gap: 9 (ref 5–15)
BUN: 24 mg/dL — ABNORMAL HIGH (ref 6–20)
CO2: 25 mmol/L (ref 22–32)
Calcium: 8.7 mg/dL — ABNORMAL LOW (ref 8.9–10.3)
Chloride: 98 mmol/L (ref 98–111)
Creatinine, Ser: 0.39 mg/dL — ABNORMAL LOW (ref 0.44–1.00)
GFR, Estimated: >60 mL/min (ref >60)
Glucose, Bld: 108 mg/dL — ABNORMAL HIGH (ref 70–99)
Potassium: 4.4 mmol/L (ref 3.5–5.1)
Sodium: 132 mmol/L — ABNORMAL LOW (ref 135–145)
Total Bilirubin: 0.6 mg/dL (ref 0.3–1.2)
Total Protein: 6.1 g/dL — ABNORMAL LOW (ref 6.5–8.1)

## 2022-01-06 LAB — LIPASE, BLOOD: Lipase: 65 U/L — ABNORMAL HIGH (ref 11–51)

## 2022-01-06 LAB — TYPE AND SCREEN
ABO/RH(D): B POS
Antibody Screen: NEGATIVE

## 2022-01-06 IMAGING — DX DG CHEST 1V PORT
1 series · 1 of 1 positions shown · non-contrast
Comparison: [DATE].

CLINICAL DATA: Rales.

EXAM:
PORTABLE CHEST 1 VIEW

[chest ap]
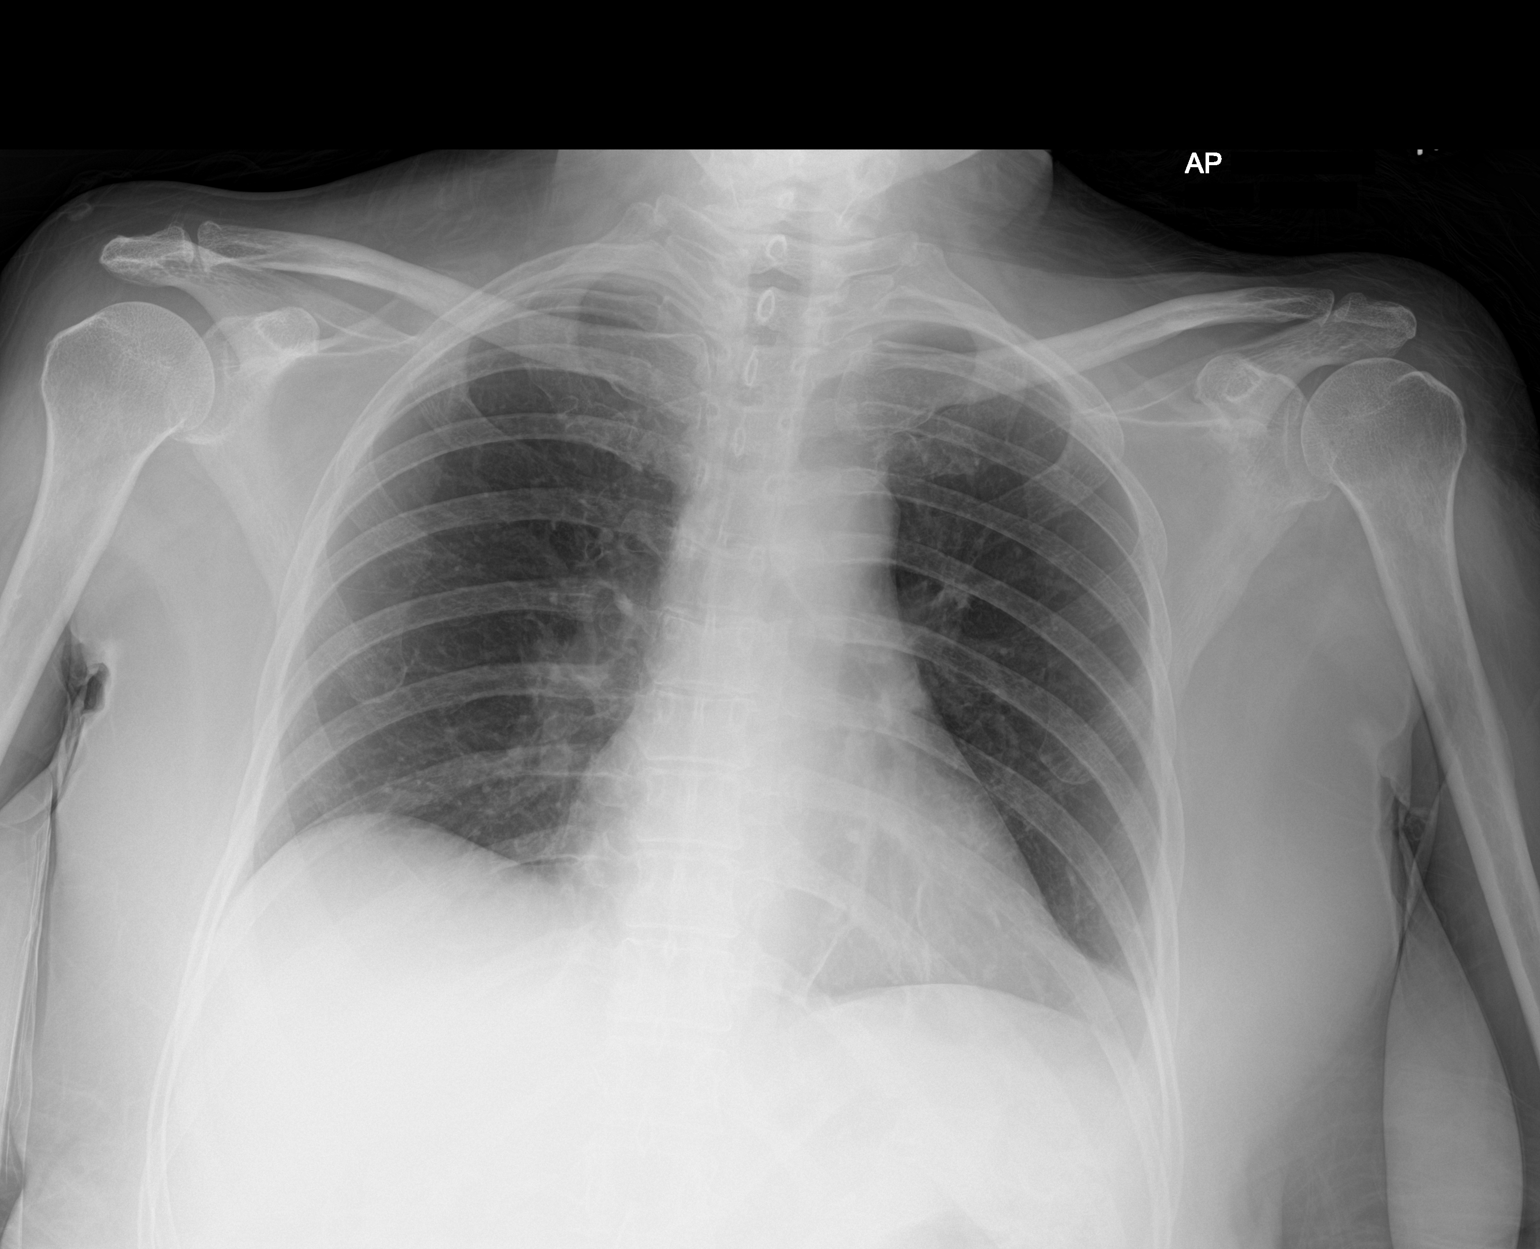

[1 of 1 positions shown; findings below may reference images not displayed]

FINDINGS: The heart size and mediastinal contours are within normal limits.
Both lungs are clear. The visualized skeletal structures are
unremarkable.
IMPRESSION: No active disease.

## 2022-01-06 IMAGING — CT CT ABD-PELV W/ CM
2 of 5 series · 14 of 46 positions shown, 16 images · IV contrast (APPLIED)
Comparison: [DATE]

CLINICAL DATA: Abdominal pain, especially in the right upper
quadrant, over the last 2 days. High-grade glioma, completed
radiation therapy 1 month ago.

EXAM:
CT ABDOMEN AND PELVIS WITH CONTRAST
TECHNIQUE: Multidetector CT imaging of the abdomen and pelvis was performed
using the standard protocol following bolus administration of
intravenous contrast.

[Series 2: axial st · axial · 0.89mm/px · z∈[-687,-297]mm · 11 of 91 slices shown, 13 images]
[im 7/91  soft-tissue]
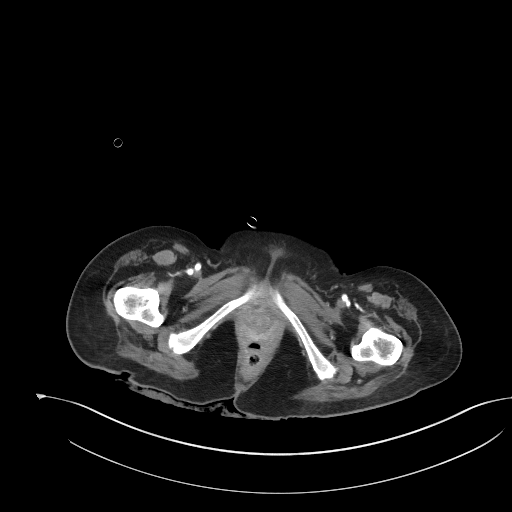
[im 7/91  bone]
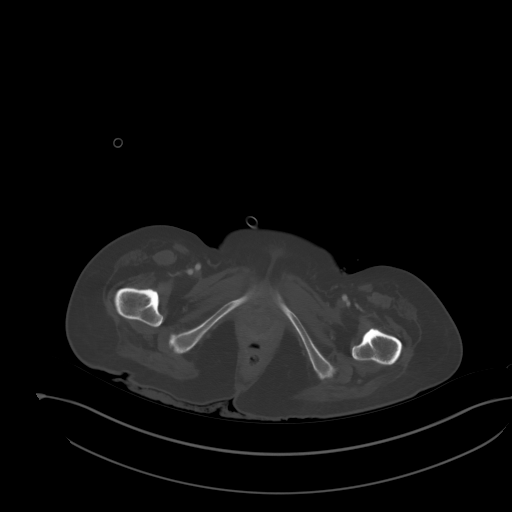
[im 13/91  soft-tissue]
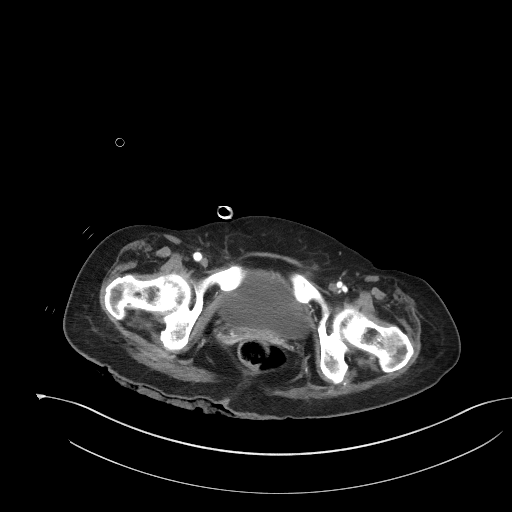
[im 25/91  soft-tissue]
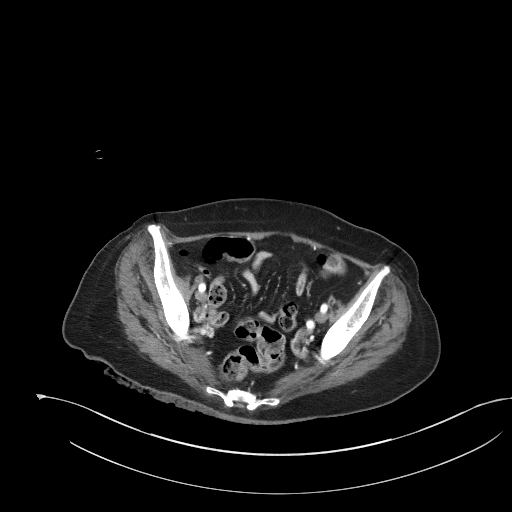
[im 31/91  soft-tissue]
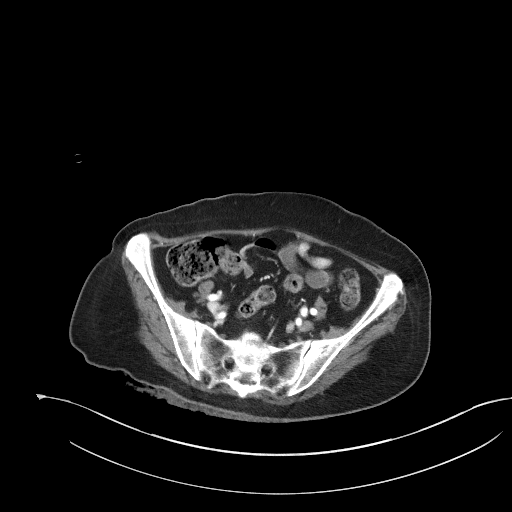
[im 37/91  soft-tissue]
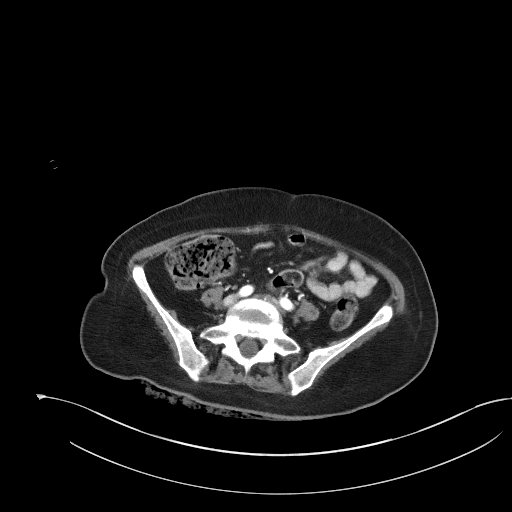
[im 49/91  soft-tissue]
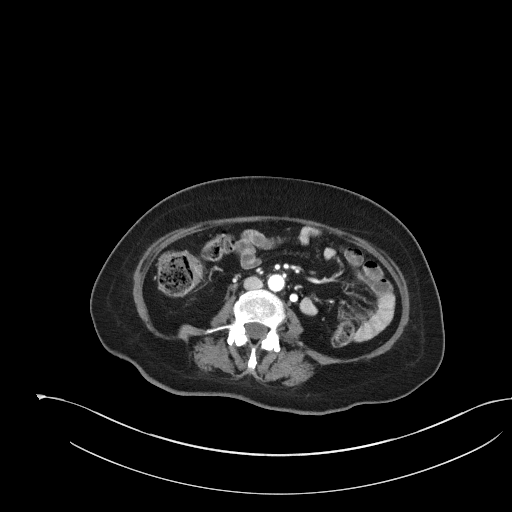
[im 55/91  soft-tissue]
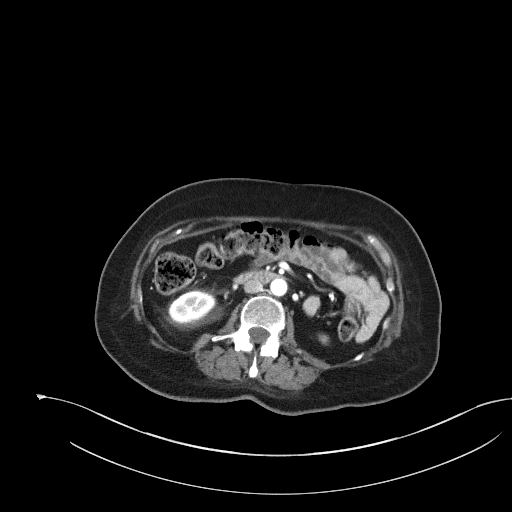
[im 61/91  soft-tissue]
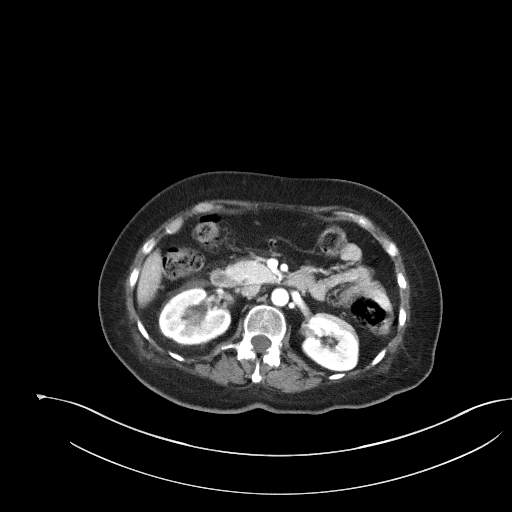
[im 67/91  soft-tissue]
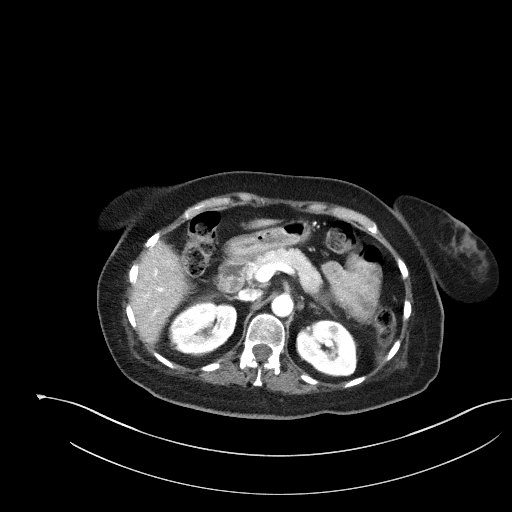
[im 67/91  bone]
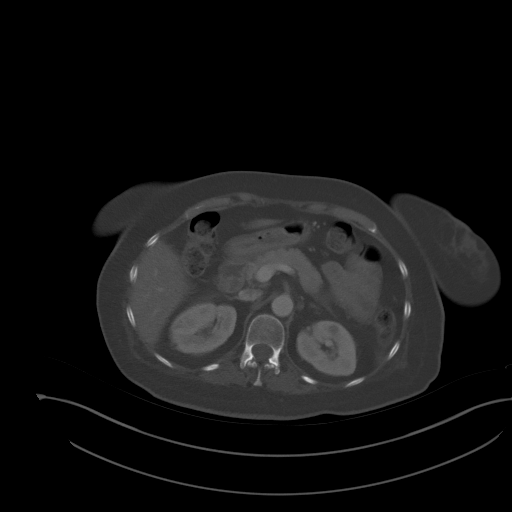
[im 79/91  soft-tissue]
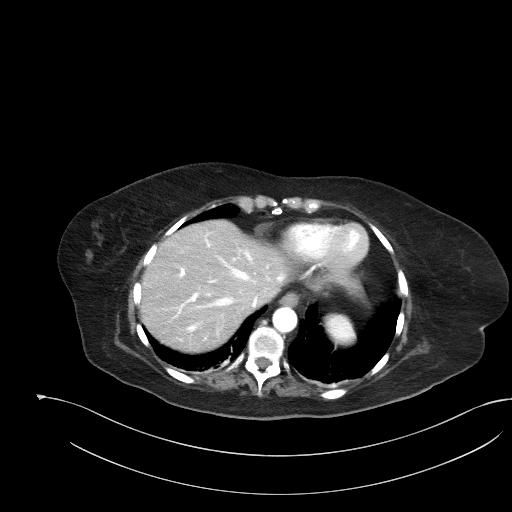
[im 85/91  soft-tissue]
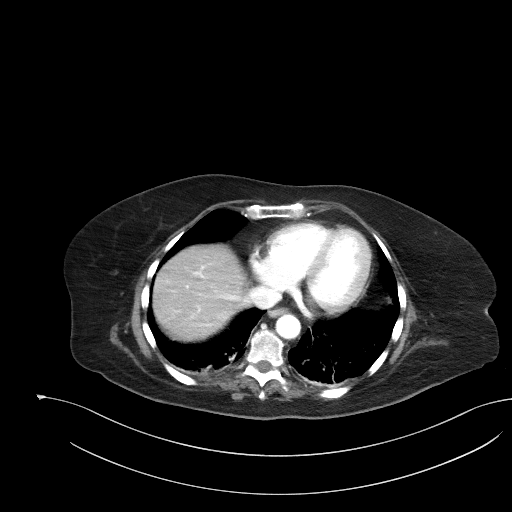

[Series 5: coronal st · coronal · 0.79mm/px · 3 of 93 slices shown]
[im 31/93  soft-tissue]
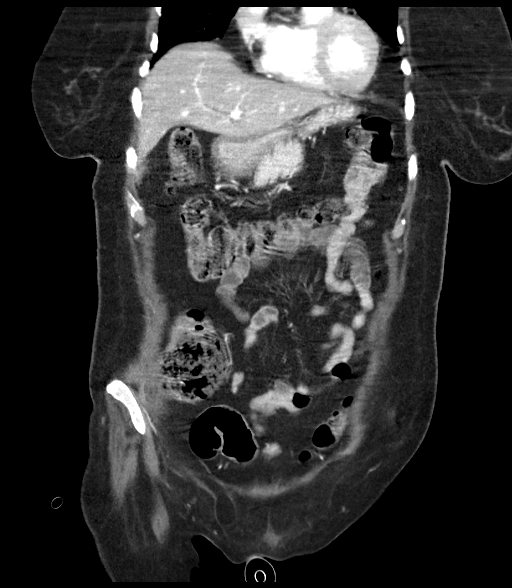
[im 41/93  soft-tissue]
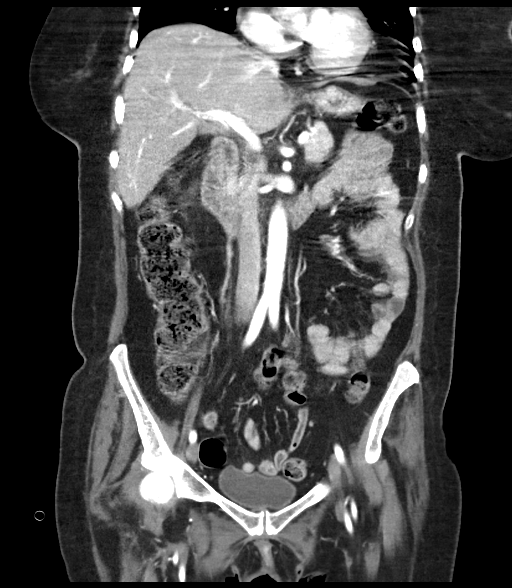
[im 52/93  soft-tissue]
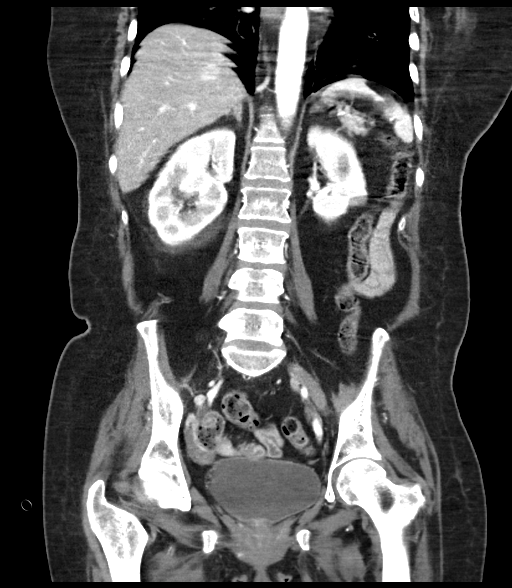

[14 of 46 positions shown; findings below may reference images not displayed]

RADIATION DOSE REDUCTION: This exam was performed according to the
departmental dose-optimization program which includes automated
exposure control, adjustment of the mA and/or kV according to
patient size and/or use of iterative reconstruction technique.

CONTRAST:  100mL OMNIPAQUE IOHEXOL 300 MG/ML  SOLN
FINDINGS: Despite efforts by the technologist and patient, motion artifact is
present on today's exam and could not be eliminated. This reduces
exam sensitivity and specificity.

Lower chest: Dependent subsegmental atelectasis or scarring in both
lower lobes, similar to the [DATE] exam.

Hepatobiliary: Cholecystectomy.  Otherwise unremarkable.

Pancreas: Unremarkable

Spleen: Unremarkable

Adrenals/Urinary Tract: New abnormal right perirenal fluid. There is
at least partial duplication of the right renal collecting system
without hydronephrosis or hydroureter. Accentuated corticomedullary
differentiation in the right kidney lower pole compared to the left
kidney and the right kidney upper pole, suspicious for mildly
delayed right kidney lower pole nephrogram. Reduced visualization of
right kidney lower pole venous tributaries compared to the
[DATE] exam, cannot exclude thrombosis of these tributaries
although the motion artifact on portal venous and delayed phase
images causes some diagnostic uncertainty. On delayed phase images,
parenchymal enhancement in the right kidney lower pole matches the
other 2 kidneys and there is some excretion contrast from the lower
pole moiety.

Small bilateral hypodense lesions are technically too small to
characterize although statistically likely to be benign. Urinary
bladder unremarkable.

Stomach/Bowel: Normal appendix. Prominent stool throughout the colon
favors constipation.

Vascular/Lymphatic: As noted in the urinary tract section above,
there is some reduce conspicuity of venous structures to the right
kidney lower pole raising the possibility of thrombosis of venous
tributaries from the right kidney lower pole. Otherwise
unremarkable. Gonadal veins patent.

Reproductive: Unremarkable

Other: No supplemental non-categorized findings.

Musculoskeletal: Right eccentric disc bulge at L5-S1.
IMPRESSION: 1. At least partially duplicated right renal collecting system, with
new abnormal perirenal stranding on the right indicating the right
kidney is the likely cause for the patient's abdominal pain. There
is some subtle additional findings including slightly earlier
contrast phase in the right kidney lower pole (with greater
corticomedullary differentiation) and reduced conspicuity of right
renal vein structures in the lower pole compared to the upper pole
and left kidney. There is also increased fullness of the right
kidney lower pole structures on delayed images compared to
[DATE]. Motion artifact complicates the assessment. The patient
has known substantial thrombocytopenia and hematuria today.
Possibilities given this appearance and scenario may include
thrombosis of renal venous tributaries from the right kidney lower
pole moiety; recent forniceal rupture of the right kidney lower pole
with some resulting effacement of venous structures; or hematoma
along the right kidney lower pole pelvis with effacement of venous
structures. Doppler renal vascular sonography with attention to the
lower pole of the right kidney may provide helpful complementary
information.
2. Bibasilar scarring or subsegmental atelectasis.
3.  Prominent stool throughout the colon favors constipation.
4. Disc bulge at L5-S1.

These results were called by telephone at the time of interpretation
on [DATE] at [DATE] to provider Dr. AZZURA , who verbally
acknowledged these results.

## 2022-01-06 MED ORDER — SODIUM CHLORIDE 0.9 % IV SOLN: INTRAVENOUS | Status: DC

## 2022-01-06 MED ORDER — SODIUM CHLORIDE 0.9 % IV BOLUS
1000.0000 mL | Freq: Once | INTRAVENOUS | Status: AC
  Administered 2022-01-06: 1000 mL via INTRAVENOUS

## 2022-01-06 MED ORDER — SODIUM CHLORIDE 0.9% IV SOLUTION: Freq: Once | INTRAVENOUS | Status: DC

## 2022-01-06 MED ORDER — IOHEXOL 300 MG/ML  SOLN
100.0000 mL | Freq: Once | INTRAMUSCULAR | Status: AC | PRN
  Administered 2022-01-06: 100 mL via INTRAVENOUS

## 2022-01-06 MED ORDER — ONDANSETRON HCL 4 MG/2ML IJ SOLN
4.0000 mg | Freq: Once | INTRAMUSCULAR | Status: AC
  Administered 2022-01-06: 4 mg via INTRAVENOUS
  Filled 2022-01-06: qty 2

## 2022-01-06 MED ORDER — LIDOCAINE 5 % EX PTCH
1.0000 | MEDICATED_PATCH | Freq: Every day | CUTANEOUS | Status: DC | PRN
  Filled 2022-01-06: qty 1

## 2022-01-06 MED ORDER — SODIUM CHLORIDE (PF) 0.9 % IJ SOLN
INTRAMUSCULAR | Status: AC
Start: 2022-01-06 — End: 2022-01-07
  Filled 2022-01-06: qty 50

## 2022-01-06 MED ORDER — LEVETIRACETAM 500 MG PO TABS
500.0000 mg | ORAL_TABLET | Freq: Two times a day (BID) | ORAL | Status: DC
  Administered 2022-01-06 – 2022-01-13 (×14): 500 mg via ORAL
  Filled 2022-01-06 (×14): qty 1

## 2022-01-06 MED ORDER — FAMOTIDINE 20 MG PO TABS
20.0000 mg | ORAL_TABLET | Freq: Two times a day (BID) | ORAL | Status: DC
  Administered 2022-01-06 – 2022-01-13 (×14): 20 mg via ORAL
  Filled 2022-01-06 (×14): qty 1

## 2022-01-06 MED ORDER — ACETAMINOPHEN 325 MG PO TABS: 650.0000 mg | ORAL_TABLET | Freq: Four times a day (QID) | ORAL | Status: DC | PRN

## 2022-01-06 MED ORDER — DEXAMETHASONE 4 MG PO TABS
4.0000 mg | ORAL_TABLET | Freq: Two times a day (BID) | ORAL | Status: DC
  Administered 2022-01-07 – 2022-01-13 (×13): 4 mg via ORAL
  Filled 2022-01-06 (×13): qty 1

## 2022-01-06 MED ORDER — ONDANSETRON HCL 4 MG PO TABS: 4.0000 mg | ORAL_TABLET | Freq: Four times a day (QID) | ORAL | Status: DC | PRN

## 2022-01-06 MED ORDER — ONDANSETRON HCL 4 MG/2ML IJ SOLN: 4.0000 mg | Freq: Four times a day (QID) | INTRAMUSCULAR | Status: DC | PRN

## 2022-01-06 MED ORDER — OXYCODONE HCL 5 MG PO TABS: 5.0000 mg | ORAL_TABLET | ORAL | Status: DC | PRN

## 2022-01-06 MED ORDER — POLYETHYLENE GLYCOL 3350 17 G PO PACK
17.0000 g | PACK | Freq: Every day | ORAL | Status: DC | PRN
  Administered 2022-01-10: 17 g via ORAL
  Filled 2022-01-06: qty 1

## 2022-01-06 MED ORDER — ACETAMINOPHEN 650 MG RE SUPP: 650.0000 mg | Freq: Four times a day (QID) | RECTAL | Status: DC | PRN

## 2022-01-06 NOTE — Telephone Encounter (Signed)
Okay to order?

## 2022-01-06 NOTE — ED Notes (Signed)
Dr. Tomi Bamberger made aware platelet level 23 ?

## 2022-01-06 NOTE — H&P (Signed)
?History and Physical  ? ? ?Patricia Stevenson JEH:[id number] DOB: November 15, 1960 DOA: 01/06/2022 ? ?PCP: Isaac Bliss, Rayford Halsted, MD  ?Patient coming from: home ? ?I have personally briefly reviewed patient's old medical records in [place] ? ?Chief Complaint: R flank pain ? ?HPI: Patricia Stevenson is Patricia Stevenson 61 y.o. 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 ? ?She's had 2 days of R sided flank pain, woke up last night screaming in pain.  Lasted minutes, then recurred.  Recurred Patricia Stevenson 3rd time around 10 am this morning.  Denies trauma, fevers, change in urinary habits.  She's been unable to walk for about 2 weeks.  She's been more confused for 3-4 weeks.  She was recently discharged after an admission for sepsis/neutropenic fever without clear infectious source identified.  She was treated with broad spectrum abx which were ultimately discontinued.  She was given granix x1 on 5/10.  She was also transfused with platelets for thrombocytopenia.   She's here today again due to pain.  Of note, Dr. Mickeal Stevenson had discussion with the patient/family regarding her recent decline and they'd began to discuss goals of care. ? ?ED Course: Labs, CT abd/pelvis, admit.  They discussed with Dr. Mickeal Stevenson. ? ?Review of Systems: As per HPI otherwise all other systems reviewed and are negative. ? ?Past Medical History:  ?Diagnosis Date  ? GERD (gastroesophageal reflux disease)   ? Hyperlipidemia   ? Vitamin D deficiency   ? ? ?Past Surgical History:  ?Procedure Laterality Date  ? APPLICATION OF CRANIAL NAVIGATION Right 09/15/2021  ? Procedure: APPLICATION OF CRANIAL NAVIGATION;  Surgeon: Vallarie Mare, MD;  Location: [facility];  Service: Neurosurgery;  Laterality: Right;  ? BREAST BIOPSY Left   ? 20 years ago in [place]- benign- not sure if bx or cyst  ? CHOLECYSTECTOMY    ? Laparoscopic cholecystectomy 04/29/2021  ?  CRANIOTOMY Right 09/15/2021  ? Procedure: Right Craniotomy Temporal Lobectomy with Brain Lab;  Surgeon: Vallarie Mare, MD;  Location: [facility];  Service: Neurosurgery;  Laterality: Right;  ? ? ?Social History ? reports that she has never smoked. She has never used smokeless tobacco. She reports that she does not drink alcohol and does not use drugs. ? ?Allergies  ?Allergen Reactions  ? Latex Rash  ? ? ?Family History  ?Problem Relation Age of Onset  ? Breast cancer Neg Hx   ? CAD Neg Hx   ? ? ? ?Prior to Admission medications   ?Medication Sig Start Date End Date Taking? Authorizing Provider  ?acetaminophen (TYLENOL) 500 MG tablet Take 1,000 mg by mouth every 6 (six) hours as needed for moderate pain or headache.   Yes [provider]  ?Ascorbic Acid (VITA-C PO) Take 500 mg by mouth daily. Unsure of dose   Yes [provider]  ?calcium carbonate (OSCAL) 1500 (600 Ca) MG TABS tablet Take 1,500 mg by mouth 2 (two) times daily with Patricia Stevenson meal.   Yes [provider]  ?dexamethasone (DECADRON) 4 MG tablet Take 1 tablet (4 mg total) by mouth 2 (two) times daily with Patricia Stevenson meal. 12/14/21  Yes Vaslow, Acey Lav, MD  ?famotidine (PEPCID) 20 MG tablet Take 1 tablet (20 mg total) by mouth 2 (two) times daily. 09/03/21  Yes Nandigam, Patricia Minks, MD  ?levETIRAcetam (KEPPRA) 500 MG tablet Take 1 tablet (500 mg total) by mouth 2 (two) times daily. 09/19/21  Yes Vallarie Mare, MD  ?ondansetron (ZOFRAN) 8 MG tablet Take 1 tablet (8 mg total) by mouth 2 (two) times daily as needed (nausea and vomiting). May take 30-60 minutes prior to Temodar administration if nausea/vomiting occurs. 10/29/21  Yes Vaslow, Acey Lav, MD  ?VITAMIN D PO Take 1,000 Units by mouth daily.   Yes [provider]  ? ? ?Physical Exam: ?Vitals:  ? 01/06/22 1749 01/06/22 1839 01/06/22 1856 01/06/22 1932  ?BP:  (!) 92/57 (!) 103/59   ?Pulse:  62 60   ?Resp:  12 16   ?Temp: 98.2 ?F (36.8 ?C) 98.2 ?F (36.8 ?C) 98 ?F (36.7 ?C)   ?TempSrc:  Oral Oral Oral   ?SpO2:  97% 99% 99%  ? ? ?Constitutional: NAD, calm, comfortable ?Vitals:  ? 01/06/22 1749 01/06/22 1839 01/06/22 1856 01/06/22 1932  ?BP:  (!) 92/57 (!) 103/59   ?Pulse:  62 60   ?Resp:  12 16   ?Temp: 98.2 ?F (36.8 ?C) 98.2 ?F (36.8 ?C) 98 ?F (36.7 ?C)   ?TempSrc: Oral Oral Oral   ?SpO2:  97% 99% 99%  ? ?Eyes: PERRL, lids and conjunctivae normal ?ENMT: Mucous membranes are moist.  ?Neck: normal, supple ?Respiratory: unlabored ?Cardiovascular: RRR ?Abdomen: no tenderness, no masses palpated. No hepatosplenomegaly. Bowel sounds positive.  ?Musculoskeletal: no clubbing / cyanosis. No joint deformity upper and lower extremities. ?Skin: scattered petechiae/purpura ?Neurologic: CN 2-12 grossly intact. Moving all extremities ?Psychiatric: Patricia Stevenson&O, but doesn't consistently answer questions, delayed responses ? ?Labs on Admission: I have personally reviewed following labs and imaging studies ? ?CBC: ?Recent Labs  ?Lab 12/31/21 ?7829 01/01/22 ?0444 01/02/22 ?5621 01/06/22 ?1148  ?WBC 2.8* 2.8* 2.4* 1.6*  ?NEUTROABS 2.5  --   --  1.4*  ?HGB 10.6* 10.7* 10.6* 12.8  ?HCT 30.9* 31.4* 29.9* 35.9*  ?MCV 94.5 95.2 93.4 93.5  ?PLT 25* 18* 43* 23*  ? ? ?Basic Metabolic Panel: ?Recent Labs  ?Lab 12/31/21 ?3086 01/01/22 ?0444 01/02/22 ?5784 01/06/22 ?1148  ?NA 132* 132* 132* 132*  ?K 3.7 3.5 3.5 4.4  ?CL 102 103 102 98  ?CO2 20* 20* 21* 25  ?GLUCOSE 145* 172* 164* 108*  ?BUN '16 17 15 '$ 24*  ?CREATININE 0.37* 0.38* 0.43* 0.39*  ?CALCIUM 8.0* 7.7* 8.0* 8.7*  ?MG 2.3 2.2 2.4  --   ?PHOS 2.4* 2.5 2.6  --   ? ? ?GFR: ?Estimated Creatinine Clearance: 55.1 mL/min (Patricia Stevenson) (by C-G formula based on SCr of 0.39 mg/dL (L)). ? ?Liver Function Tests: ?Recent Labs  ?Lab 12/31/21 ?6962 01/01/22 ?0444 01/02/22 ?9528 01/06/22 ?1148  ?AST  --   --   --  31  ?ALT  --   --   --  69*  ?ALKPHOS  --   --   --  97  ?BILITOT  --   --   --  0.6  ?PROT  --   --   --  6.1*  ?ALBUMIN 2.7* 2.6* 2.7* 3.4*  ? ? ?Urine analysis: ?   ?Component Value Date/Time   ? COLORURINE STRAW (Joanell Cressler) 01/06/2022 1236  ? APPEARANCEUR CLEAR 01/06/2022 1236  ? LABSPEC 1.009 01/06/2022 1236  ? PHURINE 8.0 01/06/2022 1236  ? GLUCOSEU NEGATIVE 01/06/2022 1236  ? HGBUR LARGE (Daquan Crapps) 01/06/2022 1236  ? [place] NEGATIVE 01/06/2022 1236  ? [place] NEGATIVE 01/06/2022 1236  ? PROTEINUR NEGATIVE 01/06/2022 1236  ? NITRITE NEGATIVE 01/06/2022 1236  ? LEUKOCYTESUR NEGATIVE 01/06/2022 1236  ? ? ?Radiological Exams on Admission: ?CT ABDOMEN PELVIS W CONTRAST ? ?  Result Date: 01/06/2022 ?CLINICAL DATA:  Abdominal pain, especially in the right upper quadrant, over the last 2 days. High-grade glioma, completed radiation therapy 1 month ago. EXAM: CT ABDOMEN AND PELVIS WITH CONTRAST TECHNIQUE: Multidetector CT imaging of the abdomen and pelvis was performed using the standard protocol following bolus administration of intravenous contrast. RADIATION DOSE REDUCTION: This exam was performed according to the departmental dose-optimization program which includes automated exposure control, adjustment of the mA and/or kV according to patient size and/or use of iterative reconstruction technique. CONTRAST:  181m OMNIPAQUE IOHEXOL 300 MG/ML  SOLN COMPARISON:  12/26/2021 FINDINGS: Despite efforts by the technologist and patient, motion artifact is present on today's exam and could not be eliminated. This reduces exam sensitivity and specificity. Lower chest: Dependent subsegmental atelectasis or scarring in both lower lobes, similar to the 12/26/2021 exam. Hepatobiliary: Cholecystectomy.  Otherwise unremarkable. Pancreas: Unremarkable Spleen: Unremarkable Adrenals/Urinary Tract: New abnormal right perirenal fluid. 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 Accentuated corticomedullary differentiation in the right kidney lower pole compared to the left kidney and the right kidney upper pole, suspicious for mildly delayed right kidney lower pole  nephrogram. Reduced visualization of right kidney lower pole venous tributaries compared to the 12/26/2021 exam, cannot exclude thrombosis of these tributaries although the motion artifact on portal venous and

## 2022-01-06 NOTE — Assessment & Plan Note (Signed)
Sinus brady ?No anticoagulation with thrombocytopenia ?

## 2022-01-06 NOTE — Assessment & Plan Note (Addendum)
resolved 

## 2022-01-06 NOTE — Assessment & Plan Note (Addendum)
CXR without acute abnormalities Currently on RA, hold additional w/u right now given stability and plan for goc discussions with neuro oncology Continue to follow - no additional complaints today - seems resolved

## 2022-01-06 NOTE — ED Provider Notes (Signed)
?[facility] DEPT ?Provider Note ? ? ?CSN: [id number] ?Arrival date & time: 01/06/22  1125 ? ?  ? ?History ? ?Chief Complaint  ?Patient presents with  ? Abdominal Pain  ? ? ?Patricia Stevenson is a 61 y.o. female. ? ? ?Abdominal Pain ?Associated symptoms: no fever   ? ?Patient has a history of hyperlipidemia, acid reflux, high-grade brain glioma, sepsis who presents to the ED with complaints of abdominal pain. Patient states she has been having pain in her right upper abdomen last couple days.  The pain is 10 out of 10.  No known vomiting or diarrhea.  No urinary symptoms.   ?Patient's health has been declining as a result of her glioma.  Patient is unable to care for her daily needs at this time.  Patient is not a candidate for systemic therapy. ? ?Home Medications ?Prior to Admission medications   ?Medication Sig Start Date End Date Taking? Authorizing Provider  ?acetaminophen (TYLENOL) 500 MG tablet Take 1,000 mg by mouth every 6 (six) hours as needed for moderate pain or headache.   Yes [provider]  ?Ascorbic Acid (VITA-C PO) Take 500 mg by mouth daily. Unsure of dose   Yes [provider]  ?calcium carbonate (OSCAL) 1500 (600 Ca) MG TABS tablet Take 1,500 mg by mouth 2 (two) times daily with a meal.   Yes [provider]  ?dexamethasone (DECADRON) 4 MG tablet Take 1 tablet (4 mg total) by mouth 2 (two) times daily with a meal. 12/14/21  Yes Vaslow, Acey Lav, MD  ?famotidine (PEPCID) 20 MG tablet Take 1 tablet (20 mg total) by mouth 2 (two) times daily. 09/03/21  Yes Nandigam, Venia Minks, MD  ?levETIRAcetam (KEPPRA) 500 MG tablet Take 1 tablet (500 mg total) by mouth 2 (two) times daily. 09/19/21  Yes Vallarie Mare, MD  ?ondansetron (ZOFRAN) 8 MG tablet Take 1 tablet (8 mg total) by mouth 2 (two) times daily as needed (nausea and vomiting). May take 30-60 minutes prior to Temodar administration if nausea/vomiting occurs. 10/29/21  Yes Vaslow, Acey Lav, MD   ?VITAMIN D PO Take 1,000 Units by mouth daily.   Yes [provider]  ?   ? ?Allergies    ?Latex   ? ?Review of Systems   ?Review of Systems  ?Constitutional:  Negative for fever.  ?Gastrointestinal:  Positive for abdominal pain.  ? ?Physical Exam ?Updated Vital Signs ?BP 114/81   Pulse 72   Temp 97.8 ?F (36.6 ?C)   Resp 18   SpO2 98%  ?Physical Exam ?Vitals and nursing note reviewed.  ?Constitutional:   ?   Appearance: She is ill-appearing. She is not diaphoretic.  ?HENT:  ?   Head: Normocephalic and atraumatic.  ?   Comments: S/p craniotomy right scalp ?   Right Ear: External ear normal.  ?   Left Ear: External ear normal.  ?Eyes:  ?   General: No scleral icterus.    ?   Right eye: No discharge.     ?   Left eye: No discharge.  ?   Conjunctiva/sclera: Conjunctivae normal.  ?Neck:  ?   Trachea: No tracheal deviation.  ?Cardiovascular:  ?   Rate and Rhythm: Normal rate and regular rhythm.  ?Pulmonary:  ?   Effort: Pulmonary effort is normal. No respiratory distress.  ?   Breath sounds: Normal breath sounds. No stridor. No wheezing or rales.  ?Abdominal:  ?   General: Bowel sounds are normal. There  is no distension.  ?   Palpations: Abdomen is soft.  ?   Tenderness: There is abdominal tenderness in the right upper quadrant. There is no guarding or rebound.  ?   Hernia: No hernia is present.  ?Musculoskeletal:     ?   General: No tenderness or deformity.  ?   Cervical back: Neck supple.  ?Skin: ?   General: Skin is warm and dry.  ?   Findings: No rash.  ?Neurological:  ?   General: No focal deficit present.  ?   Mental Status: She is alert.  ?   Cranial Nerves: No cranial nerve deficit (no facial droop,).  ?   Sensory: No sensory deficit.  ?   Motor: Weakness present. No abnormal muscle tone or seizure activity.  ?   Comments: Speech slow, some word finding difficulty,   ?Psychiatric:     ?   Mood and Affect: Mood normal.  ? ? ?ED Results / Procedures / Treatments   ?Labs ?(all labs ordered are listed,  but only abnormal results are displayed) ?Labs Reviewed  ?COMPREHENSIVE METABOLIC PANEL - Abnormal; Notable for the following components:  ?    Result Value  ? Sodium 132 (*)   ? Glucose, Bld 108 (*)   ? BUN 24 (*)   ? Creatinine, Ser 0.39 (*)   ? Calcium 8.7 (*)   ? Total Protein 6.1 (*)   ? Albumin 3.4 (*)   ? ALT 69 (*)   ? All other components within normal limits  ?LIPASE, BLOOD - Abnormal; Notable for the following components:  ? Lipase 65 (*)   ? All other components within normal limits  ?CBC WITH DIFFERENTIAL/PLATELET - Abnormal; Notable for the following components:  ? WBC 1.6 (*)   ? RBC 3.84 (*)   ? HCT 35.9 (*)   ? RDW 16.6 (*)   ? Platelets 23 (*)   ? nRBC 1.9 (*)   ? Neutro Abs 1.4 (*)   ? Lymphs Abs 0.1 (*)   ? All other components within normal limits  ?URINALYSIS, ROUTINE W REFLEX MICROSCOPIC - Abnormal; Notable for the following components:  ? Color, Urine STRAW (*)   ? Hgb urine dipstick LARGE (*)   ? RBC / HPF >50 (*)   ? Bacteria, UA RARE (*)   ? All other components within normal limits  ?URINE CULTURE  ?TYPE AND SCREEN  ? ? ?EKG ?None ? ?Radiology ?CT ABDOMEN PELVIS W CONTRAST ? ?Result Date: 01/06/2022 ?CLINICAL DATA:  Abdominal pain, especially in the right upper quadrant, over the last 2 days. High-grade glioma, completed radiation therapy 1 month ago. EXAM: CT ABDOMEN AND PELVIS WITH CONTRAST TECHNIQUE: Multidetector CT imaging of the abdomen and pelvis was performed using the standard protocol following bolus administration of intravenous contrast. RADIATION DOSE REDUCTION: This exam was performed according to the departmental dose-optimization program which includes automated exposure control, adjustment of the mA and/or kV according to patient size and/or use of iterative reconstruction technique. CONTRAST:  193m OMNIPAQUE IOHEXOL 300 MG/ML  SOLN COMPARISON:  12/26/2021 FINDINGS: Despite efforts by the technologist and patient, motion artifact is present on today's exam and could not  be eliminated. This reduces exam sensitivity and specificity. Lower chest: Dependent subsegmental atelectasis or scarring in both lower lobes, similar to the 12/26/2021 exam. Hepatobiliary: Cholecystectomy.  Otherwise unremarkable. Pancreas: Unremarkable Spleen: Unremarkable Adrenals/Urinary Tract: New abnormal right perirenal fluid. 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 Accentuated corticomedullary differentiation in the right kidney lower pole compared to the left kidney and the right kidney upper pole, suspicious for mildly delayed right kidney lower pole nephrogram. Reduced visualization of right kidney lower pole venous tributaries compared to the 12/26/2021 exam, cannot exclude thrombosis of these tributaries although the motion artifact on portal venous and delayed phase images causes some diagnostic uncertainty. On delayed phase images, parenchymal enhancement in the right kidney lower pole matches the other 2 kidneys and there is some excretion contrast from the lower pole moiety. Small bilateral hypodense lesions are technically too small to characterize although statistically likely to be benign. Urinary bladder unremarkable. Stomach/Bowel: Normal appendix. Prominent stool throughout the colon favors constipation. Vascular/Lymphatic: As noted in the urinary tract section above, there is some reduce conspicuity of venous structures to the right kidney lower pole raising the possibility of thrombosis of venous tributaries from the right kidney lower pole. Otherwise unremarkable. Gonadal veins patent. Reproductive: Unremarkable Other: No supplemental non-categorized findings. Musculoskeletal: Right eccentric disc bulge at L5-S1. IMPRESSION: 1. At least partially duplicated right renal collecting system, with new abnormal perirenal stranding on the right indicating the right kidney is the likely cause for the patient's abdominal pain.  There is some subtle additional findings including slightly earlier contrast phase in the right kidney lower pole (with greater corticomedullary differentiation) and reduced conspicuity of right renal vein st

## 2022-01-06 NOTE — Assessment & Plan Note (Addendum)
Delirium precautions MRI 5/7 with mixed imaging response to therapy since march -> regressed enhancement in R midbrain, but new petichial enhancement in R optic radiations, new discontinuous T2/Flair hyperintensity in both medial R thalamus and R corona radiata Head CT with punctate focus of increased density in R pons (may represent small punctate focus of hemorrhage within previously noted tumor) - neuro oncology aware Overall mental status seems improved with abx, stable for discharge

## 2022-01-06 NOTE — Assessment & Plan Note (Addendum)
Related to abnormal CT scan  New abnormal perirenal stranding on R ddx includes thrombosis of renal venous tributaries vs recent forniceal rupture vs hematoma along right kidney lower pole pelvis with effacement of venous structures.  Of note, she's not Treasa Bradshaw candidate for anticoaguation with thrombocytopenia.  Also, discussion with urology notes another possible dx would be renal stone (limited eval with contrast). Renal artery [place] with patent IVC and renal veins bilaterally, right renal vein patent prox mid, distal and at hiluim.  Left renal vein patent distal and at hilum, limited visualization at prox and mid segments. UA with RBC's, negative leukocyte and nitrite -> 60,000 e. Coli, 10,000 enterobacter aerogenes - both sensitive to cefepime - will discuss with ID Cefepime 5/18 - 5/22.  Had planned to discharge with additional 2 days bactrim for 7 days total on 5/22, prescription sent - current on bactrim inpatient. S/p 2 units platelets 5/17 given possible hematoma  Discussed with urology, not need for any acute intervention from their standpoint Follow symptomatically, consider repeat imaging - with improvement will hold off on repeating imaging. Pain management

## 2022-01-06 NOTE — Progress Notes (Signed)
Renal artery duplex study completed. ? ?Preliminary results relayed to Florene Glen, MD. ? ?See CV Proc for preliminary results report.  ? ?Darlin Coco, RDMS, RVT on behalf of [place], RDMS, RVT ? ?

## 2022-01-06 NOTE — Assessment & Plan Note (Signed)
Related to encephalopathy vs mood/depression ?Will monitor ?

## 2022-01-06 NOTE — Assessment & Plan Note (Addendum)
Consider therapy eval based on discussion with neuro oncology Will try to get home health PT/OT at home if able

## 2022-01-06 NOTE — ED Notes (Signed)
Patient transported to CT 

## 2022-01-06 NOTE — Assessment & Plan Note (Signed)
noted 

## 2022-01-06 NOTE — ED Triage Notes (Signed)
Pt bib ems from home with worsening RUQ abdominal pain X2 days. Rates pain 10/10. Pt also tender to palpation all 4 quadrants. LBM yesterday. Pt with hx Brain CA. Finished Radiation approx 1 month ago. Last dose tylenol 0940 today without relief. Hx cholecystectomy.  ? ?BP 160/110 ?HR 53 ?RR 16 ?97% RA ?

## 2022-01-06 NOTE — Assessment & Plan Note (Addendum)
Given abnormal renal imaging, concern for possible hematoma S/p platelets x2 units 5/17 She has scattered petechiae and purpura  WBC count/ANC and platelets dropping again - will transfuse 1 unit platelets today after discussion with oncology Oncology notes they can follow outpatient

## 2022-01-06 NOTE — ED Notes (Signed)
Pt aware of need for urine sample. Purewik placed.  ?

## 2022-01-06 NOTE — Assessment & Plan Note (Addendum)
S/p surgical resection and chemoradiation with temodar Currently temodar on hold per recent d/c summary Continue keppra and decadron Dr. Mickeal Skinner planning on discussion regarding goals of care, he'd broached this discussion 5/16 with family.   Appreciate Dr. Renda Rolls assistance -> open to visiting with palliative care, will consult at this time.  Continued palliative care discussions outpatient.

## 2022-01-07 ENCOUNTER — Encounter (HOSPITAL_COMMUNITY): Payer: Self-pay | Admitting: Family Medicine

## 2022-01-07 ENCOUNTER — Inpatient Hospital Stay (HOSPITAL_COMMUNITY): Payer: 59

## 2022-01-07 DIAGNOSIS — N390 Urinary tract infection, site not specified: Secondary | ICD-10-CM

## 2022-01-07 DIAGNOSIS — N10 Acute tubulo-interstitial nephritis: Secondary | ICD-10-CM | POA: Insufficient documentation

## 2022-01-07 DIAGNOSIS — R109 Unspecified abdominal pain: Secondary | ICD-10-CM | POA: Diagnosis not present

## 2022-01-07 LAB — BILIRUBIN, FRACTIONATED(TOT/DIR/INDIR)
Bilirubin, Direct: 0.1 mg/dL (ref 0.0–0.2)
Indirect Bilirubin: 0.7 mg/dL (ref 0.3–0.9)
Total Bilirubin: 0.8 mg/dL (ref 0.3–1.2)

## 2022-01-07 LAB — CBC
HCT: 26.9 % — ABNORMAL LOW (ref 36.0–46.0)
Hemoglobin: 9.4 g/dL — ABNORMAL LOW (ref 12.0–15.0)
MCH: 32.9 pg (ref 26.0–34.0)
MCHC: 34.9 g/dL (ref 30.0–36.0)
MCV: 94.1 fL (ref 80.0–100.0)
Platelets: 122 10*3/uL — ABNORMAL LOW (ref 150–400)
RBC: 2.86 MIL/uL — ABNORMAL LOW (ref 3.87–5.11)
RDW: 17.1 % — ABNORMAL HIGH (ref 11.5–15.5)
WBC: 0.7 10*3/uL — CL (ref 4.0–10.5)
nRBC: 5.5 % — ABNORMAL HIGH (ref 0.0–0.2)

## 2022-01-07 LAB — COMPREHENSIVE METABOLIC PANEL
ALT: 55 U/L — ABNORMAL HIGH (ref 0–44)
AST: 22 U/L (ref 15–41)
Albumin: 2.7 g/dL — ABNORMAL LOW (ref 3.5–5.0)
Alkaline Phosphatase: 42 U/L (ref 38–126)
Anion gap: 8 (ref 5–15)
BUN: 19 mg/dL (ref 6–20)
CO2: 25 mmol/L (ref 22–32)
Calcium: 8.1 mg/dL — ABNORMAL LOW (ref 8.9–10.3)
Chloride: 102 mmol/L (ref 98–111)
Creatinine, Ser: 0.33 mg/dL — ABNORMAL LOW (ref 0.44–1.00)
GFR, Estimated: >60 mL/min (ref >60)
Glucose, Bld: 80 mg/dL (ref 70–99)
Potassium: 2.9 mmol/L — ABNORMAL LOW (ref 3.5–5.1)
Sodium: 135 mmol/L (ref 135–145)
Total Bilirubin: 0.9 mg/dL (ref 0.3–1.2)
Total Protein: 5 g/dL — ABNORMAL LOW (ref 6.5–8.1)

## 2022-01-07 LAB — MAGNESIUM: Magnesium: 2 mg/dL (ref 1.7–2.4)

## 2022-01-07 IMAGING — CT CT HEAD W/O CM
4 series · 16 of 47 positions shown, 18 images · non-contrast
Comparison: [DATE] CT head, [DATE] MRI head

CLINICAL DATA: Mental status change, unknown cause



[Series 2: head wo · axial · 0.41mm/px · z∈[-125,-15]mm · 7 of 30 slices shown, 9 images]
[im 4/30  brain]
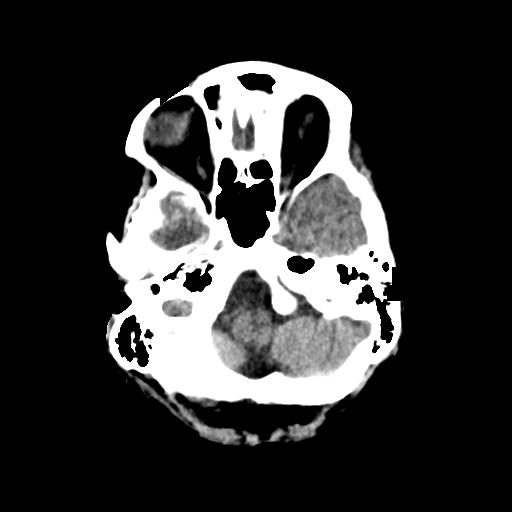
[im 4/30  bone]
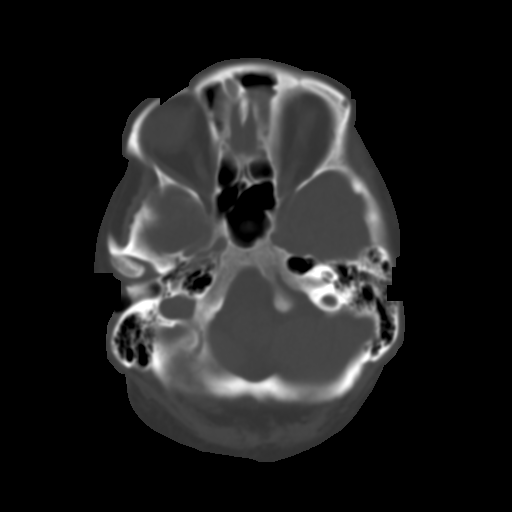
[im 8/30  brain]
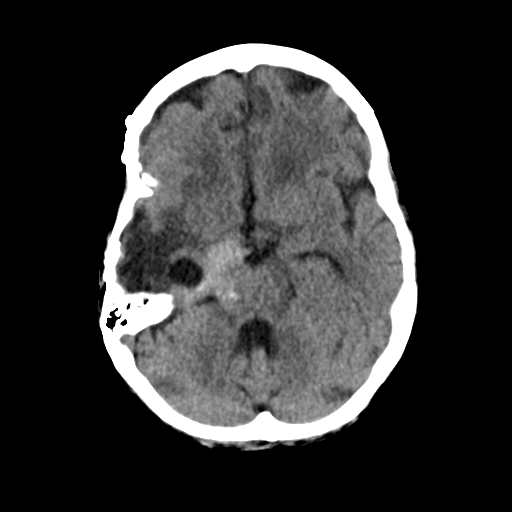
[im 11/30  brain]
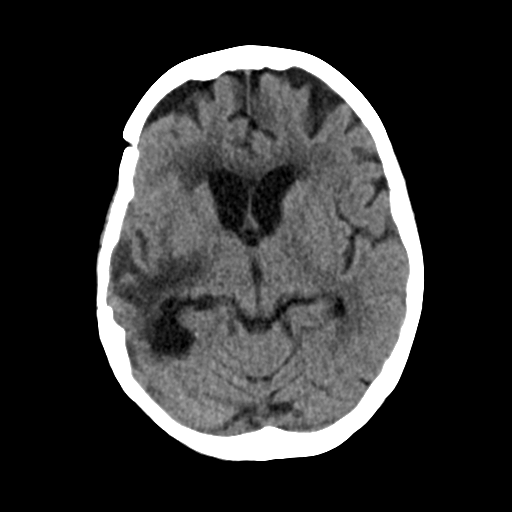
[im 15/30  brain]
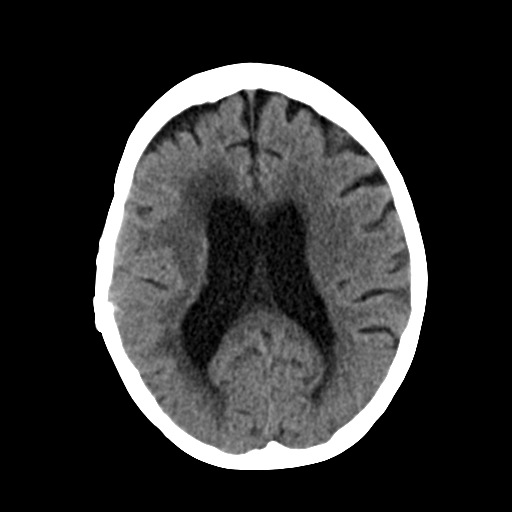
[im 19/30  brain]
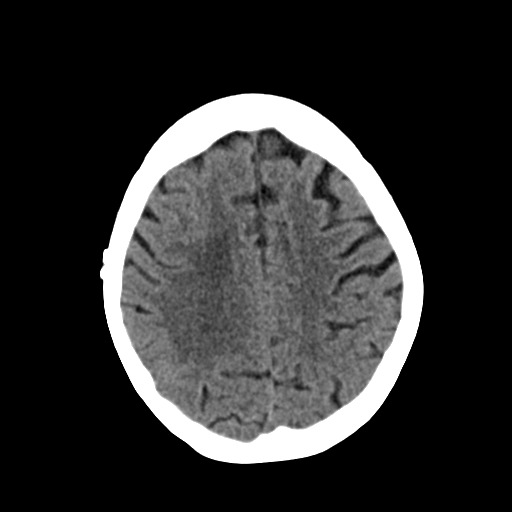
[im 19/30  bone]
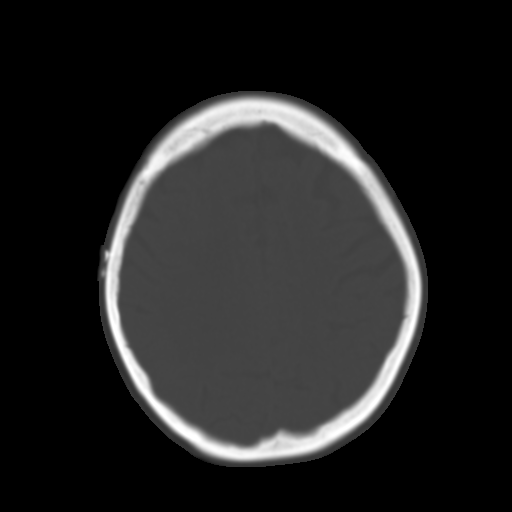
[im 22/30  brain]
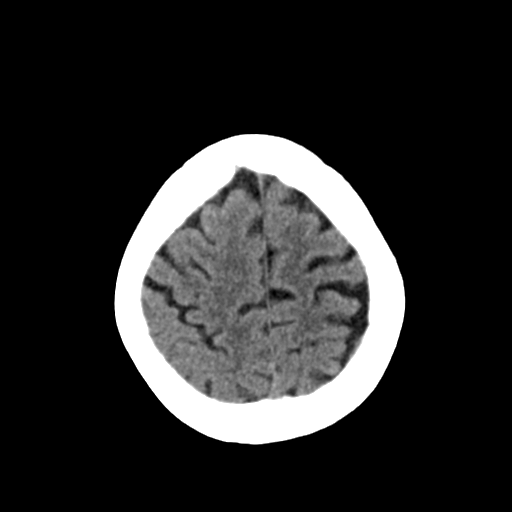
[im 26/30  brain]
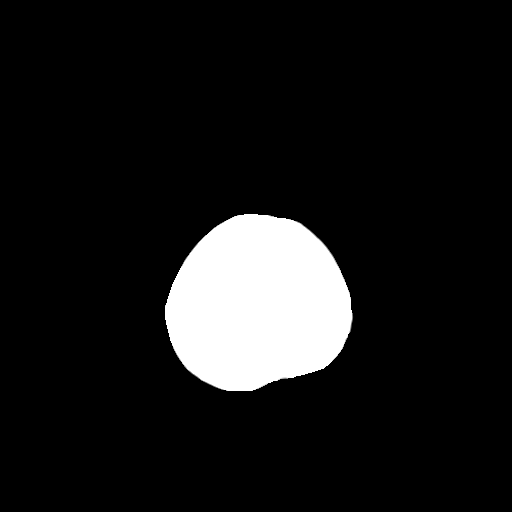

[Series 3: head bone · axial · 0.41mm/px · z∈[-126,-96]mm · 3 of 75 slices shown]
[im 8/75  bone]
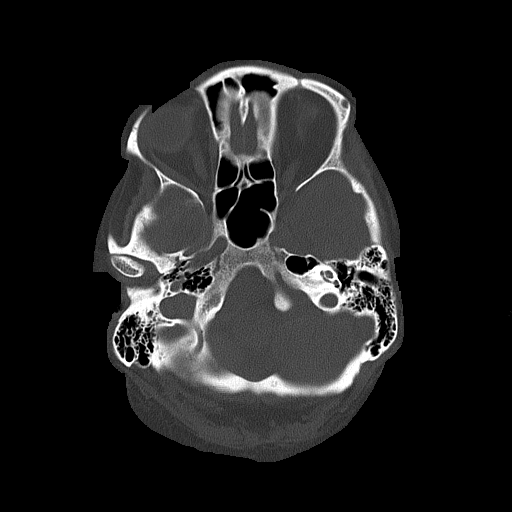
[im 15/75  bone]
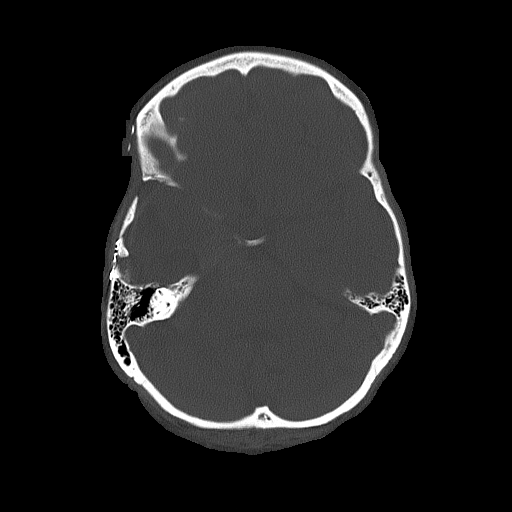
[im 23/75  bone]
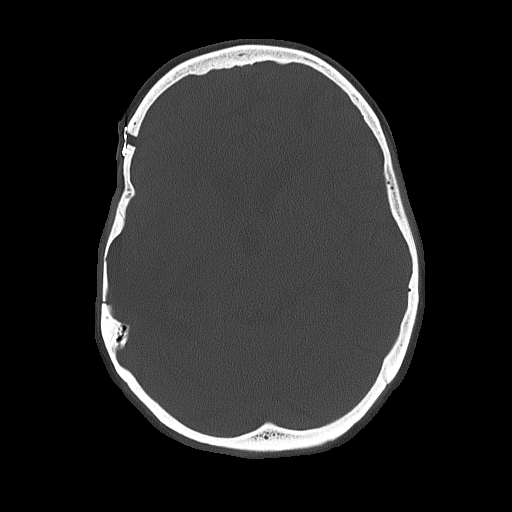

[Series 4: sagittal soft tissue · sagittal · 0.30mm/px · 3 of 54 slices shown]
[im 18/54  brain]
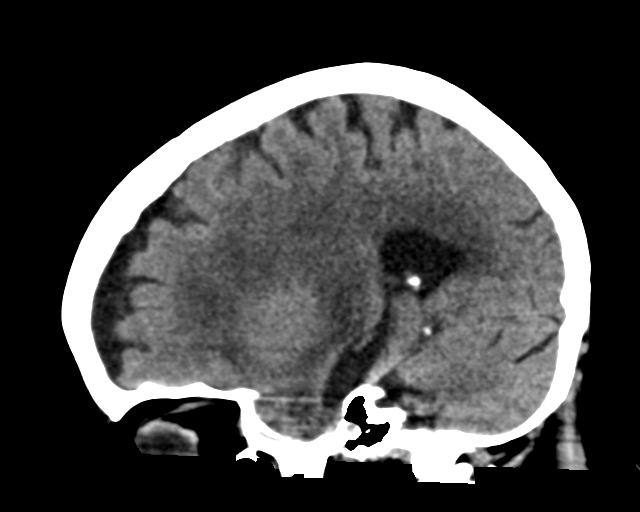
[im 27/54  brain]
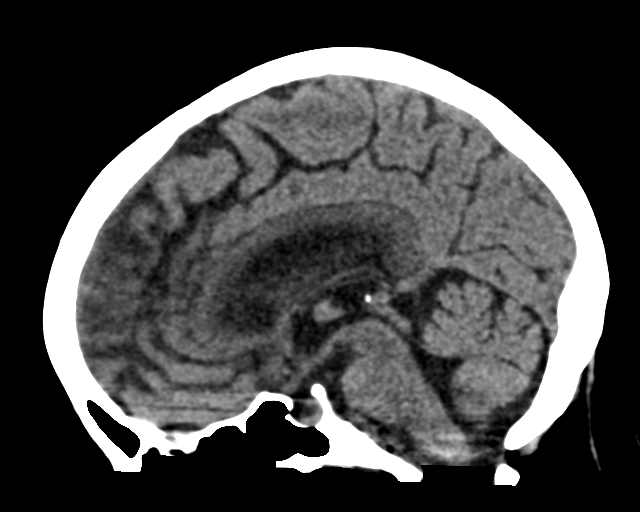
[im 36/54  brain]
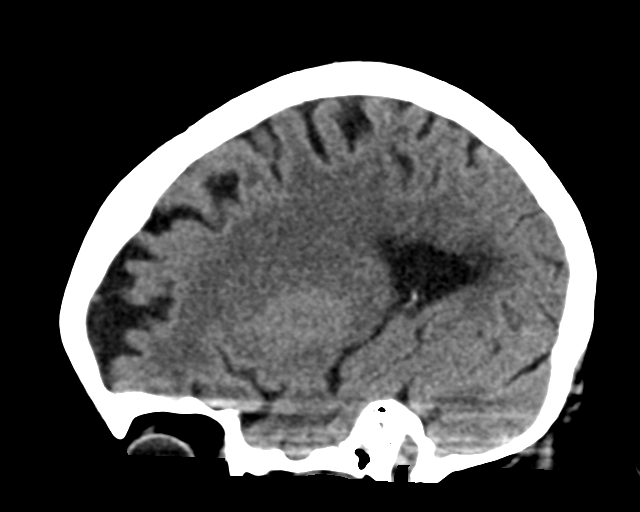

[Series 5: coronal soft tissue · coronal · 0.30mm/px · 3 of 64 slices shown]
[im 22/64  brain]
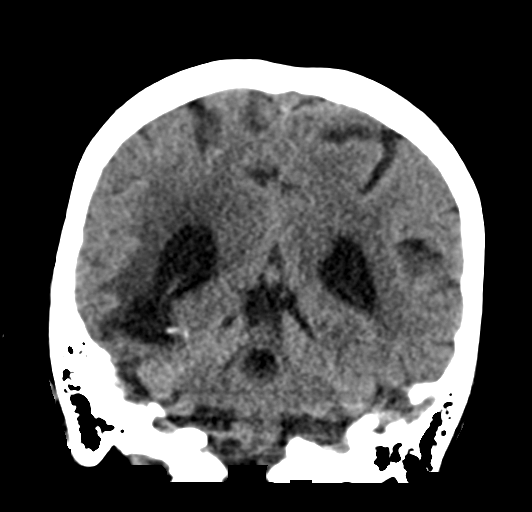
[im 29/64  brain]
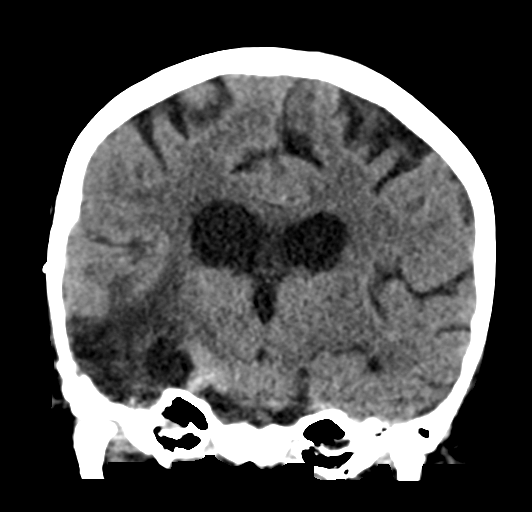
[im 36/64  brain]
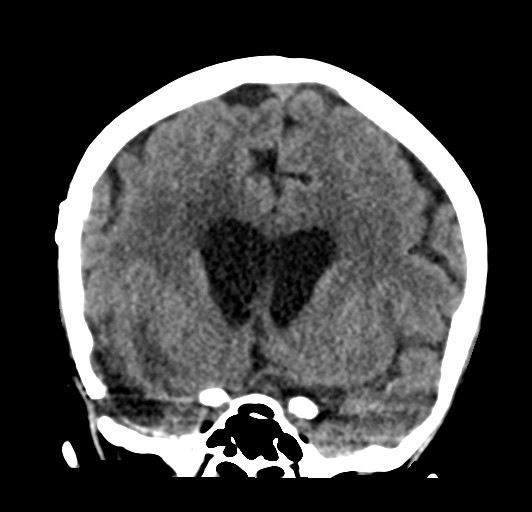

[16 of 47 positions shown; findings below may reference images not displayed]

FINDINGS: Brain: Redemonstrated right temporal lobe resection cavity and
encephalomalacia. Redemonstrated mildly hyperdense tumor in the
region of the right temporal lobe, cerebral peduncle, midbrain, and
pons, which appears overall unchanged from the prior exam; more
punctate focus of increased density in the right pons (series 2,
image 8), which may represent a small punctate focus of hemorrhage.
No definite acute infarct, mass effect, or midline shift. No
hydrocephalus or extra-axial collection. Periventricular white
matter changes, likely the sequela of chronic small vessel ischemic
disease.

Vascular: No hyperdense vessel.

Skull: Right frontotemporal craniotomy. No acute fracture or
suspicious osseous lesion.

Sinuses/Orbits: No acute finding.

Other: The mastoids are well aerated.
IMPRESSION: Punctate focus of increased density in the right pons, which may
represent a small punctate focus of hemorrhage within the previously
noted tumor. Otherwise unchanged tumor in the right temporal lobe,
cerebral peduncle, midbrain, and pons.

## 2022-01-07 MED ORDER — TBO-FILGRASTIM 300 MCG/0.5ML ~~LOC~~ SOSY
300.0000 ug | PREFILLED_SYRINGE | Freq: Every day | SUBCUTANEOUS | Status: DC
  Administered 2022-01-07: 300 ug via SUBCUTANEOUS
  Filled 2022-01-07: qty 0.5

## 2022-01-07 MED ORDER — SODIUM CHLORIDE 0.9 % IV BOLUS
250.0000 mL | Freq: Once | INTRAVENOUS | Status: AC
  Administered 2022-01-07: 250 mL via INTRAVENOUS

## 2022-01-07 MED ORDER — SODIUM CHLORIDE 0.9 % IV SOLN
2.0000 g | Freq: Two times a day (BID) | INTRAVENOUS | Status: DC
  Administered 2022-01-07 – 2022-01-11 (×8): 2 g via INTRAVENOUS
  Filled 2022-01-07 (×9): qty 12.5

## 2022-01-07 MED ORDER — SODIUM CHLORIDE 0.9 % IV SOLN
2.0000 g | INTRAVENOUS | Status: AC
Start: 2022-01-07 — End: 2022-01-07
  Administered 2022-01-07: 2 g via INTRAVENOUS
  Filled 2022-01-07: qty 12.5

## 2022-01-07 MED ORDER — POTASSIUM CHLORIDE CRYS ER 20 MEQ PO TBCR
40.0000 meq | EXTENDED_RELEASE_TABLET | ORAL | Status: AC
  Administered 2022-01-07 (×2): 40 meq via ORAL
  Filled 2022-01-07 (×2): qty 2

## 2022-01-07 NOTE — Progress Notes (Addendum)
HEMATOLOGY-ONCOLOGY PROGRESS NOTE  ASSESSMENT AND PLAN: 61 year old lady with recently diagnosed high-grade glioma, status post surgery in January 2023, finished concurrent chemoradiation on April 21st 2023.    Flank pain Pancytopenia High-grade glioma, finished concurrent chemoradiation on April 21, last dose Temodar held since December 08, 2021 due to thrombocytopenia. Atrial fibrillation   Recommendations: -The patient has persistent pancytopenia.  Previous work-up showed no evidence of hemolysis.  Pancytopenia has been thought to be due to recent chemotherapy, but has been off for about 1 month. -Awaiting further goals of care discussion per Dr. Mickeal Skinner, but if consistent with goals of care, would consider starting her on Granix until [place] is 1.5 or higher. -Platelets up to 122,000 today after receiving platelet transfusion.  Transfuse platelets for platelet count less than 20,000 or active bleeding.  Mikey Bussing, DNP, AGPCNP-BC, AOCNP  SUBJECTIVE: The patient is known to our service from her last hospital admission.  Admitted for flank pain.  Followed by Dr. Mickeal Skinner for high-grade glioma.  Again found to have pancytopenia.  Has received 2 units of platelets so far this admission.  Was seen by Dr. Mickeal Skinner on 5/16 and recommendation was for hospice.  The patient has not yet made a decision about this.  Additional goals of care discussion plan for later today.  The patient was seen and examined.  She is lying in the bed.  Does not answer any questions.  Family friend at the bedside.  No active bleeding.  Oncology History  High grade glioma not classifiable by WHO criteria ([place])  09/15/2021 Initial Diagnosis   High grade glioma not classifiable by WHO criteria ([place])    09/15/2021 Cancer Staging   Staging form: Brain and Spinal Cord, AJCC Version 9 - Clinical stage from 09/15/2021: No stage assigned - Signed by Ventura Sellers, MD on 10/08/2021 Histopathologic type: Astrocytoma, NOS Stage  prefix: Initial diagnosis    10/19/2021 -  Chemotherapy   Patient is on Treatment Plan : BRAIN GLIOBLASTOMA Radiation Therapy With Concurrent Temozolomide 75 mg/m2 Daily Followed By Sequential Maintenance Temozolomide x 6-12 cycles         REVIEW OF SYSTEMS:   Review of Systems  Reason unable to perform ROS: Unable to obtain.   I have reviewed the past medical history, past surgical history, social history and family history with the patient and they are unchanged from previous note.   PHYSICAL EXAMINATION: ECOG PERFORMANCE STATUS: 4 - Bedbound  Vitals:   01/07/22 0225 01/07/22 0336  BP: (!) 81/52 132/76  Pulse: (!) 54 (!) 59  Resp: 16 14  Temp: 97.7 F (36.5 C) 98 F (36.7 C)  SpO2: 98% 97%   There were no vitals filed for this visit.  Intake/Output from previous day: 05/17 0701 - 05/18 0700 In: 1019.5 [Blood:787.3; IV Piggyback:232.2] Out: 900 [Urine:900]  Physical Exam Vitals reviewed.  Constitutional:      Appearance: She is ill-appearing.  Skin:    General: Skin is warm and dry.     Comments: Scattered petechiae and ecchymoses.  Neurological:     Comments: Follows commands.    LABORATORY DATA:  I have reviewed the data as listed    Latest Ref Rng & Units 01/07/2022    5:58 AM 01/06/2022   11:48 AM 01/02/2022    5:40 AM  CMP  Glucose 70 - 99 mg/dL 80   108   164    BUN 6 - 20 mg/dL '19   24   15    ' Creatinine  0.44 - 1.00 mg/dL 0.33   0.39   0.43    Sodium 135 - 145 mmol/L 135   132   132    Potassium 3.5 - 5.1 mmol/L 2.9   4.4   3.5    Chloride 98 - 111 mmol/L 102   98   102    CO2 22 - 32 mmol/L '25   25   21    ' Calcium 8.9 - 10.3 mg/dL 8.1   8.7   8.0    Total Protein 6.5 - 8.1 g/dL 5.0   6.1     Total Bilirubin 0.3 - 1.2 mg/dL 0.9     0.8   0.6     Alkaline Phos 38 - 126 U/L 42   57     AST 15 - 41 U/L 22   31     ALT 0 - 44 U/L 55   69       Lab Results  Component Value Date   WBC 0.7 (LL) 01/07/2022   HGB 9.4 (L) 01/07/2022   HCT 26.9 (L)  01/07/2022   MCV 94.1 01/07/2022   PLT 122 (L) 01/07/2022   NEUTROABS 1.4 (L) 01/06/2022    No results found for: CEA1, CEA, NOB096, CA125, PSA1  CT Head Wo Contrast  Result Date: 12/26/2021 CLINICAL DATA:  Altered mental status EXAM: CT HEAD WITHOUT CONTRAST TECHNIQUE: Contiguous axial images were obtained from the base of the skull through the vertex without intravenous contrast. RADIATION DOSE REDUCTION: This exam was performed according to the departmental dose-optimization program which includes automated exposure control, adjustment of the mA and/or kV according to patient size and/or use of iterative reconstruction technique. COMPARISON:  None Available. FINDINGS: Brain: Right temporal lobe encephalomalacia at site of prior tumor resection. There is hyperdensity near the right cerebral peduncle corresponding to previously demonstrated area of tumor. No acute hemorrhage or extra-axial collection. Generalized volume loss with periventricular white matter changes. Vascular: No abnormal hyperdensity of the major intracranial arteries or dural venous sinuses. No intracranial atherosclerosis. Skull: Remote right craniotomy. Sinuses/Orbits: No fluid levels or advanced mucosal thickening of the visualized paranasal sinuses. No mastoid or middle ear effusion. The orbits are normal. IMPRESSION: 1. No acute intracranial abnormality. 2. Right temporal lobe encephalomalacia at site of prior tumor resection. Electronically Signed   By: Ulyses Jarred M.D.   On: 12/26/2021 23:07   CT Angio Chest PE W and/or Wo Contrast  Result Date: 12/16/2021 CLINICAL DATA:  Shortness of breath and left lower extremity edema. Brain tumor. EXAM: CT ANGIOGRAPHY CHEST WITH CONTRAST TECHNIQUE: Multidetector CT imaging of the chest was performed using the standard protocol during bolus administration of intravenous contrast. Multiplanar CT image reconstructions and MIPs were obtained to evaluate the vascular anatomy. RADIATION DOSE  REDUCTION: This exam was performed according to the departmental dose-optimization program which includes automated exposure control, adjustment of the mA and/or kV according to patient size and/or use of iterative reconstruction technique. CONTRAST:  64m OMNIPAQUE IOHEXOL 350 MG/ML SOLN COMPARISON:  None. FINDINGS: Cardiovascular: Negative for pulmonary embolus. Atherosclerotic calcification of the aorta. Enlarged pulmonic trunk. Heart is at the upper limits of normal in size. No pericardial effusion. Mediastinum/Nodes: No pathologically enlarged mediastinal, hilar or axillary lymph nodes. Esophagus is grossly unremarkable. Lungs/Pleura: Ground-glass is seen in both lower lobes and may be due to slight expiratory phase imaging. No dense airspace consolidation. No pleural fluid. Airway is unremarkable. Upper Abdomen: Visualized portions of the liver, adrenal glands, kidneys, spleen, pancreas,  stomach and bowel are grossly unremarkable. Musculoskeletal: Degenerative changes in the spine. No worrisome lytic or sclerotic lesions. Review of the MIP images confirms the above findings. IMPRESSION: 1. Negative for pulmonary embolus. 2. No definitive findings to explain the patient's clinical history. 3.  Aortic atherosclerosis (ICD10-I70.0). 4. Enlarged pulmonic trunk, indicative of pulmonary arterial hypertension. Electronically Signed   By: Lorin Picket M.D.   On: 12/16/2021 16:10   MR BRAIN W WO CONTRAST  Result Date: 12/27/2021 CLINICAL DATA:  61 year old female with high-grade glioma status post subtotal resection. Altered mental status. EXAM: MRI HEAD WITHOUT AND WITH CONTRAST TECHNIQUE: Multiplanar, multiecho pulse sequences of the brain and surrounding structures were obtained without and with intravenous contrast. CONTRAST:  16m GADAVIST GADOBUTROL 1 MMOL/ML IV SOLN COMPARISON:  Head CT yesterday. Restaging brain MRI 10/27/2021 and earlier. FINDINGS: Brain: Rounded, masslike enhancement at the right  midbrain and cerebral peduncle has regressed and is now indistinct on series 16, image 57. However, nearby Patchy and confluent enhancement at the right optic radiations appears increased and is not obviously vascular related (series 16, image 58 and also postcontrast coronal series 19, image 16). Regional temporal lobe resection cavity has not significantly changed in size or configuration. Stable associated hemosiderin. And confluent regional T2 and FLAIR hyperintensity is stable to mildly improved. However, there is new discontinuous T2/FLAIR abnormality in the posteromedial right thalamus on series 11, image 25. And ongoing discontinuous although improved T2 hyperintensity in the right brainstem at the pons (series 8, image 7). Additionally, confluent right corona radiata T2 and FLAIR hyperintensity has progressed since March but is facilitated on diffusion (series 11, image 36), nonenhancing. On DWI heterogeneity in the tumor region and right brainstem is not significantly changed. Post craniotomy dural thickening along the right middle cranial fossa is stable. No midline shift. Stable ventricle size and configuration. No restricted diffusion suggestive of acute infarction. No acute intracranial hemorrhage. Cervicomedullary junction and pituitary are within normal limits. Stable gray and white matter signal elsewhere. Vascular: Major intracranial vascular flow voids are stable. The major dural venous sinuses are enhancing and appear to be patent. Skull and upper cervical spine: Negative visible cervical spine and spinal cord. Stable bone marrow signal. Sinuses/Orbits: Stable and negative. Other: Visible internal auditory structures appear normal. Stable scalp and face. IMPRESSION: 1. Mixed imaging response to therapy since March: Regressed enhancement in the right midbrain, but new petechial enhancement in the right optic radiations. New discontinuous T2/FLAIR hyperintensity in both the medial right thalamus  and right corona radiata. Otherwise essentially stable appearance of infiltrating tumor in the right hemisphere and brainstem. 2. No new intracranial abnormality. Electronically Signed   By: HGenevie AnnM.D.   On: 12/27/2021 09:09   CT ABDOMEN PELVIS W CONTRAST  Result Date: 01/06/2022 CLINICAL DATA:  Abdominal pain, especially in the right upper quadrant, over the last 2 days. High-grade glioma, completed radiation therapy 1 month ago. EXAM: CT ABDOMEN AND PELVIS WITH CONTRAST TECHNIQUE: Multidetector CT imaging of the abdomen and pelvis was performed using the standard protocol following bolus administration of intravenous contrast. RADIATION DOSE REDUCTION: This exam was performed according to the departmental dose-optimization program which includes automated exposure control, adjustment of the mA and/or kV according to patient size and/or use of iterative reconstruction technique. CONTRAST:  1050mOMNIPAQUE IOHEXOL 300 MG/ML  SOLN COMPARISON:  12/26/2021 FINDINGS: Despite efforts by the technologist and patient, motion artifact is present on today's exam and could not be eliminated. This reduces exam sensitivity and specificity.  Lower chest: Dependent subsegmental atelectasis or scarring in both lower lobes, similar to the 12/26/2021 exam. Hepatobiliary: Cholecystectomy.  Otherwise unremarkable. Pancreas: Unremarkable Spleen: Unremarkable Adrenals/Urinary Tract: New abnormal right perirenal fluid. 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 Accentuated corticomedullary differentiation in the right kidney lower pole compared to the left kidney and the right kidney upper pole, suspicious for mildly delayed right kidney lower pole nephrogram. Reduced visualization of right kidney lower pole venous tributaries compared to the 12/26/2021 exam, cannot exclude thrombosis of these tributaries although the motion artifact on portal venous and delayed  phase images causes some diagnostic uncertainty. On delayed phase images, parenchymal enhancement in the right kidney lower pole matches the other 2 kidneys and there is some excretion contrast from the lower pole moiety. Small bilateral hypodense lesions are technically too small to characterize although statistically likely to be benign. Urinary bladder unremarkable. Stomach/Bowel: Normal appendix. Prominent stool throughout the colon favors constipation. Vascular/Lymphatic: As noted in the urinary tract section above, there is some reduce conspicuity of venous structures to the right kidney lower pole raising the possibility of thrombosis of venous tributaries from the right kidney lower pole. Otherwise unremarkable. Gonadal veins patent. Reproductive: Unremarkable Other: No supplemental non-categorized findings. Musculoskeletal: Right eccentric disc bulge at L5-S1. IMPRESSION: 1. At least partially duplicated right renal collecting system, with new abnormal perirenal stranding on the right indicating the right kidney is the likely cause for the patient's abdominal pain. There is some subtle additional findings including slightly earlier contrast phase in the right kidney lower pole (with greater corticomedullary differentiation) and reduced conspicuity of right renal vein structures in the lower pole compared to the upper pole and left kidney. There is also increased fullness of the right kidney lower pole structures on delayed images compared to 12/26/2021. Motion artifact complicates the assessment. The patient has known substantial thrombocytopenia and hematuria today. Possibilities given this appearance and scenario may include thrombosis of renal venous tributaries from the right kidney lower pole moiety; recent forniceal rupture of the right kidney lower pole with some resulting effacement of venous structures; or hematoma along the right kidney lower pole pelvis with effacement of venous structures.  Doppler renal vascular sonography with attention to the lower pole of the right kidney may provide helpful complementary information. 2. Bibasilar scarring or subsegmental atelectasis. 3.  Prominent stool throughout the colon favors constipation. 4. Disc bulge at L5-S1. These results were called by telephone at the time of interpretation on 01/06/2022 at 2:55 pm to provider Dr. Dorie Rank , who verbally acknowledged these results. Electronically Signed   By: Van Clines M.D.   On: 01/06/2022 15:02   CT Abdomen Pelvis W Contrast  Result Date: 12/26/2021 CLINICAL DATA:  Abdominal pain. EXAM: CT ABDOMEN AND PELVIS WITH CONTRAST TECHNIQUE: Multidetector CT imaging of the abdomen and pelvis was performed using the standard protocol following bolus administration of intravenous contrast. RADIATION DOSE REDUCTION: This exam was performed according to the departmental dose-optimization program which includes automated exposure control, adjustment of the mA and/or kV according to patient size and/or use of iterative reconstruction technique. CONTRAST:  175m OMNIPAQUE IOHEXOL 300 MG/ML  SOLN COMPARISON:  CT abdomen pelvis dated 06/10/2021. FINDINGS: Lower chest: Bibasilar linear atelectasis. No intra-abdominal free air or free fluid. Hepatobiliary: The liver is unremarkable. No intrahepatic biliary dilatation. Cholecystectomy. Pancreas: Unremarkable. No pancreatic ductal dilatation or surrounding inflammatory changes. Spleen: Normal in size without focal abnormality. Adrenals/Urinary Tract: The adrenal glands  unremarkable the kidneys, visualized ureters, and urinary bladder appear unremarkable. Stomach/Bowel: There is moderate stool throughout the colon. There is no bowel obstruction or active inflammation. The appendix is normal. Vascular/Lymphatic: The abdominal aorta and IVC are unremarkable. No portal venous gas. There is no adenopathy. Reproductive: The uterus and ovaries are grossly unremarkable. No pelvic  mass. Other: None Musculoskeletal: No acute or significant osseous findings. IMPRESSION: 1. No acute intra-abdominal or pelvic pathology. 2. Moderate colonic stool burden. No bowel obstruction. Normal appendix. Electronically Signed   By: Anner Crete M.D.   On: 12/26/2021 23:22   DG CHEST PORT 1 VIEW  Result Date: 01/06/2022 CLINICAL DATA:  [place]. EXAM: PORTABLE CHEST 1 VIEW COMPARISON:  Dec 27, 2021. FINDINGS: The heart size and mediastinal contours are within normal limits. Both lungs are clear. The visualized skeletal structures are unremarkable. IMPRESSION: No active disease. Electronically Signed   By: Marijo Conception M.D.   On: 01/06/2022 17:41   Portable chest 1 View  Result Date: 12/27/2021 CLINICAL DATA:  Generalized weakness. EXAM: PORTABLE CHEST 1 VIEW COMPARISON:  Chest radiograph dated 12/16/2021 and CT dated 12/16/2021 FINDINGS: Minimal left lung base atelectasis. No focal consolidation, pleural effusion, pneumothorax. Stable cardiac silhouette. No acute osseous pathology. IMPRESSION: No active disease. Electronically Signed   By: Anner Crete M.D.   On: 12/27/2021 00:47   DG Chest Port 1 View  Result Date: 12/16/2021 CLINICAL DATA:  Shortness of breath EXAM: PORTABLE CHEST 1 VIEW COMPARISON:  None. FINDINGS: The heart size and mediastinal contours are within normal limits. Both lungs are clear. The visualized skeletal structures are unremarkable. IMPRESSION: No acute abnormality of the lungs in AP portable projection. Electronically Signed   By: Delanna Ahmadi M.D.   On: 12/16/2021 15:26   VAS [place] LOWER EXTREMITY VENOUS (DVT) (ONLY [facility] & [facility])  Result Date: 12/16/2021  Lower Venous DVT Study Patient Name:  MELADY CHOW  Date of Exam:   12/16/2021 Medical Rec #: [id number]     Accession #:    [id number] Date of Birth: [birth year]    Patient Gender: F Patient Age:   29 years Exam Location:  [facility] Procedure:      VAS [place] LOWER EXTREMITY VENOUS (DVT) Referring Phys: JOSEPH  ZAMMIT --------------------------------------------------------------------------------  Indications: SOB, and LLE edema.  Risk Factors: Chemotherapy. Comparison Study: No previous exams Performing Technologist: Jody Hill RVT, RDMS  Examination Guidelines: A complete evaluation includes B-mode imaging, spectral Doppler, color Doppler, and power Doppler as needed of all accessible portions of each vessel. Bilateral testing is considered an integral part of a complete examination. Limited examinations for reoccurring indications may be performed as noted. The reflux portion of the exam is performed with the patient in reverse Trendelenburg.  +---------+---------------+---------+-----------+----------+--------------+ RIGHT    CompressibilityPhasicitySpontaneityPropertiesThrombus Aging +---------+---------------+---------+-----------+----------+--------------+ CFV      Full           Yes      Yes                                 +---------+---------------+---------+-----------+----------+--------------+ SFJ      Full                                                        +---------+---------------+---------+-----------+----------+--------------+  FV Prox  Full           Yes      Yes                                 +---------+---------------+---------+-----------+----------+--------------+ FV Mid   Full           Yes      Yes                                 +---------+---------------+---------+-----------+----------+--------------+ FV DistalFull           Yes      Yes                                 +---------+---------------+---------+-----------+----------+--------------+ PFV      Full                                                        +---------+---------------+---------+-----------+----------+--------------+ POP      Full           Yes      Yes                                 +---------+---------------+---------+-----------+----------+--------------+ PTV       Full                                                        +---------+---------------+---------+-----------+----------+--------------+ PERO     Full                                                        +---------+---------------+---------+-----------+----------+--------------+   +---------+---------------+---------+-----------+----------+--------------+ LEFT     CompressibilityPhasicitySpontaneityPropertiesThrombus Aging +---------+---------------+---------+-----------+----------+--------------+ CFV      Full           Yes      Yes                                 +---------+---------------+---------+-----------+----------+--------------+ SFJ      Full                                                        +---------+---------------+---------+-----------+----------+--------------+ FV Prox  Full           Yes      Yes                                 +---------+---------------+---------+-----------+----------+--------------+ FV Mid   Full  Yes      Yes                                 +---------+---------------+---------+-----------+----------+--------------+ FV DistalFull           Yes      Yes                                 +---------+---------------+---------+-----------+----------+--------------+ PFV      Full                                                        +---------+---------------+---------+-----------+----------+--------------+ POP      Full           Yes      Yes                                 +---------+---------------+---------+-----------+----------+--------------+ PTV      Full                                                        +---------+---------------+---------+-----------+----------+--------------+ PERO     Full                                                        +---------+---------------+---------+-----------+----------+--------------+     Summary: BILATERAL: - No evidence of deep vein  thrombosis seen in the lower extremities, bilaterally. - No evidence of superficial venous thrombosis in the lower extremities, bilaterally. -No evidence of popliteal cyst, bilaterally.  LEFT: - subcutaneous edema seen in area of popliteal fossa and calf.  *See table(s) above for measurements and observations. Electronically signed by Harold Barban MD on 12/16/2021 at 8:14:16 PM.    Final    VAS US RENAL ARTERY DUPLEX  Result Date: 01/06/2022 ABDOMINAL VISCERAL Patient Name:  SOO STEELMAN  Date of Exam:   01/06/2022 Medical Rec #: [id number]     Accession #:    [id number] Date of Birth: 1960-11-30    Patient Gender: F Patient Age:   7 years [facility]am [facility]  [facility] Procedure:      VAS US RENAL ARTERY DUPLEX Referring Phys: [id number] JON KNAPP -------------------------------------------------------------------------------- Indications: Suspicion for renal vein thrombosis on CT Limitations: Air/bowel gas, obesity and patient immobility. Comparison Study: 01-06-2022 CT abdomen pelvis with contrast Performing Technologist: Rogelia Rohrer RVT, RDMS  Examination Guidelines: A complete evaluation includes B-mode imaging, spectral Doppler, color Doppler, and power Doppler as needed of all accessible portions of each vessel. Bilateral testing is considered an integral part of a complete examination. Limited examinations for reoccurring indications may be performed as noted.  Duplex Findings:  Summary: Renal:  Bilateral: IVC and renal veins are patent bilaterally. Right renal            vein patent prox, mid, distal, and  at hilum, Left renal            vein patent distal and at hilum. Limited visualization at            prox and mid segments.  *See table(s) above for measurements and observations.  Diagnosing physician: Jamelle Haring  Electronically signed by Jamelle Haring on 01/06/2022 at 9:28:42 PM.    Final      Future Appointments  Date Time Provider [place]  01/26/2022  8:00 AM [facility]  [facility] None      LOS: 1 day   Addendum  I saw pt for her cytopenias during her last hospital admission.  She was readmitted yesterday for right flank pain, CT scan showed right perirenal fluid, possible hemorrhage.  Her platelets was 23 yesterday, received platelet transfusion and went to 122K this morning.  She has persistent pancytopenia, WBC has dropped further.  Will restart Granix for her and monitor CBC with differential daily.  Dr. Mickeal Skinner has discussed goal of care with patient and her husband, patient will likely be transition to hospice.  We will hold on bone marrow biopsy at this point, continue supportive care for her pancytopenia.  Truitt Merle  01/07/2022

## 2022-01-07 NOTE — Progress Notes (Signed)
PROGRESS NOTE    Patricia Stevenson  ZJI:[id number] DOB: 1961/06/12 DOA: 01/06/2022 PCP: Isaac Bliss, Rayford Halsted, MD  Chief Complaint  Patient presents with   Abdominal Pain    Brief Narrative:  Patricia Stevenson is Patricia Stevenson 61 y.o. 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   She's had 2 days of R sided flank pain, woke up last night screaming in pain.  Lasted minutes, then recurred.  Recurred Patricia Erney 3rd time around 10 am this morning.  Denies trauma, fevers, change in urinary habits.  She's been unable to walk for about 2 weeks.  She's been more confused for 3-4 weeks.  She was recently discharged after an admission for sepsis/neutropenic fever without clear infectious source identified.  She was treated with broad spectrum abx which were ultimately discontinued.  She was given granix x1 on 5/10.  She was also transfused with platelets for thrombocytopenia.   She's here today again due to pain.  Of note, Dr. Mickeal Skinner had discussion with the patient/family regarding her recent decline and they'd began to discuss goals of care.    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)   Pancytopenia (HCC)   Acute metabolic encephalopathy   Shortness of breath   Urinary tract infection   SIRS    Paroxysmal atrial fibrillation (HCC)   Flat affect   Physical deconditioning   Hypokalemia   Hyponatremia   Bradycardia   Hypertension   Assessment and Plan: * Flank pain Related to abnormal CT scan  New abnormal perirenal stranding on R ddx includes thrombosis of renal venous tributaries vs recent forniceal rupture vs hematoma along right kidney lower pole pelvis with effacement of venous structures Renal artery [place] with patent IVC and renal veins bilaterally, right renal vein patent prox mid, distal and  at hiluim.  Left renal vein patent distal and at hilum, limited visualization at prox and mid segments. UA with RBC's, negative leukocyte and nitrite -> 60,000 gram negative rods Cefepime 5/18 -  S/p 2 units platelets 5/17 given possible hematoma as cause of sx and thrombocytopenia, platelets improved today Discussed with urology, not need for any acute intervention from their standpoint Follow symptomatically, consider repeat imaging Pain management  Pancytopenia ([place]) Given abnormal renal imaging, concern for possible hematoma S/p platelets x2 units 5/17 ANC 0.7 today, appreciate oncology assitance Oncology recommended considering granix if consistent with goals of care until [place] 1.5 or higher She has scattered petechiae and purpura    High grade glioma not classifiable by WHO criteria [facility]) S/p surgical resection and chemoradiation with temodar Currently temodar on hold per recent d/c summary Continue keppra and decadron Dr. Mickeal Skinner planning on discussion regarding goals of care, he'd broached this discussion 5/16 with family.   Appreciate Dr. Renda Rolls assistance -> open to visiting with palliative care, will consult at this time.  Urinary tract infection abx as above  Shortness of breath CXR without acute abnormalities Currently on RA, hold additional w/u right now given stability and plan for goc discussions with neuro oncology Continue to follow  Acute metabolic encephalopathy Delirium precautions MRI 5/7 with mixed imaging response to therapy since march -> regressed enhancement in R midbrain, but new petichial enhancement in R optic radiations, new discontinuous T2/Flair hyperintensity in both medial R thalamus and R corona radiata Head CT  with punctate focus of increased density in R pons (may represent small punctate focus of hemorrhage within previously noted tumor) - neuro oncology aware  Paroxysmal atrial fibrillation (HCC) Sinus brady No anticoagulation with  thrombocytopenia  Flat affect Related to encephalopathy vs mood/depression Will monitor  Physical deconditioning Consider therapy eval based on discussion with neuro oncology  Hypokalemia Replace and follow  Hyponatremia mild  Bradycardia noted      DVT prophylaxis: SCD Code Status: full Family Communication: none Disposition:   Status is: Inpatient Remains inpatient appropriate because: need for IV abx, GOC. Oncology, neuro oncology, palliative care recs   Consultants:  Neuro oncology oncology  Procedures:  Summary:  Renal:     Bilateral: IVC and renal veins are patent bilaterally. Right renal             vein patent prox, mid, distal, and at hilum, Left renal             vein patent distal and at hilum. Limited visualization at             prox and mid segments.   Antimicrobials:  Anti-infectives (From admission, onward)    Start     Dose/Rate Route Frequency Ordered Stop   01/07/22 2300  ceFEPIme (MAXIPIME) 2 g in sodium chloride 0.9 % 100 mL IVPB        2 g 200 mL/hr over 30 Minutes Intravenous Every 12 hours 01/07/22 1158     01/07/22 1245  ceFEPIme (MAXIPIME) 2 g in sodium chloride 0.9 % 100 mL IVPB        2 g 200 mL/hr over 30 Minutes Intravenous NOW 01/07/22 1157 01/07/22 1308       Subjective: Patricia Stevenson at bedside Spanish interpreter   Objective: Vitals:   01/07/22 0204 01/07/22 0225 01/07/22 0336  BP: (!) 95/56 (!) 81/52 132/76  Pulse: (!) 53 (!) 54 (!) 59  Resp: '18 16 14  '$ Temp: 98.3 F (36.8 C) 97.7 F (36.5 C) 98 F (36.7 C)  TempSrc: Oral Axillary Axillary  SpO2: 98% 98% 97%    Intake/Output Summary (Last 24 hours) at 01/07/2022 1813 Last data filed at 01/07/2022 1731 Gross per 24 hour  Intake 1471.5 ml  Output 1601 ml  Net -129.5 ml   There were no vitals filed for this visit.  Examination:  General exam: Appears calm and comfortable  Respiratory system: unlabored Cardiovascular system: RRR Gastrointestinal system:  Abdomen is nondistended, soft and nontender.  Central nervous system: Alert, but flat affect - occasionally answers questions and response, moving all extremities Extremities: no LEE    Data Reviewed: I have personally reviewed following labs and imaging studies  CBC: Recent Labs  Lab 01/01/22 0444 01/02/22 0540 01/06/22 1148 01/07/22 0558  WBC 2.8* 2.4* 1.6* 0.7*  NEUTROABS  --   --  1.4*  --   HGB 10.7* 10.6* 12.8 9.4*  HCT 31.4* 29.9* 35.9* 26.9*  MCV 95.2 93.4 93.5 94.1  PLT 18* 43* 23* 122*    Basic Metabolic Panel: Recent Labs  Lab 01/01/22 0444 01/02/22 0540 01/06/22 1148 01/07/22 0558  NA 132* 132* 132* 135  K 3.5 3.5 4.4 2.9*  CL 103 102 98 102  CO2 20* 21* 25 25  GLUCOSE 172* 164* 108* 80  BUN 17 15 24* 19  CREATININE 0.38* 0.43* 0.39* 0.33*  CALCIUM 7.7* 8.0* 8.7* 8.1*  MG 2.2 2.4  --  2.0  PHOS 2.5 2.6  --   --  GFR: Estimated Creatinine Clearance: 55.1 mL/min (Patricia Stevenson) (by C-G formula based on SCr of 0.33 mg/dL (L)).  Liver Function Tests: Recent Labs  Lab 01/01/22 0444 01/02/22 0540 01/06/22 1148 01/07/22 0558  AST  --   --  31 22  ALT  --   --  69* 55*  ALKPHOS  --   --  57 42  BILITOT  --   --  0.6 0.8  0.9  PROT  --   --  6.1* 5.0*  ALBUMIN 2.6* 2.7* 3.4* 2.7*    CBG: No results for input(s): GLUCAP in the last 168 hours.   Recent Results (from the past 240 hour(s))  Urine Culture     Status: Abnormal (Preliminary result)   Collection Time: 01/06/22  3:51 PM   Specimen: Urine, Clean Catch  Result Value Ref Range Status   Specimen Description   Final    URINE, CLEAN CATCH Performed at [facility], [place] [address], [place], [place] [address]    Special Requests   Final    NONE Performed at [facility], [place] [address], [place], [place] [address]    Culture 60,000 COLONIES/mL GRAM NEGATIVE RODS (Patricia Stevenson)  Final   Report Status PENDING  Incomplete         Radiology Studies: CT HEAD WO  CONTRAST (5MM)  Result Date: 01/07/2022 CLINICAL DATA:  Mental status change, unknown cause EXAM: CT HEAD WITHOUT CONTRAST TECHNIQUE: Contiguous axial images were obtained from the base of the skull through the vertex without intravenous contrast. RADIATION DOSE REDUCTION: This exam was performed according to the departmental dose-optimization program which includes automated exposure control, adjustment of the mA and/or kV according to patient size and/or use of iterative reconstruction technique. COMPARISON:  12/26/2021 CT head, 12/27/2021 MRI head FINDINGS: Brain: Redemonstrated right temporal lobe resection cavity and encephalomalacia. Redemonstrated mildly hyperdense tumor in the region of the right temporal lobe, cerebral peduncle, midbrain, and pons, which appears overall unchanged from the prior exam; more punctate focus of increased density in the right pons (series 2, image 8), which may represent Patricia Lebeda small punctate focus of hemorrhage. No definite acute infarct, mass effect, or midline shift. No hydrocephalus or extra-axial collection. Periventricular white matter changes, likely the sequela of chronic small vessel ischemic disease. Vascular: No hyperdense vessel. Skull: Right frontotemporal craniotomy. No acute fracture or suspicious osseous lesion. Sinuses/Orbits: No acute finding. Other: The mastoids are well aerated. IMPRESSION: Punctate focus of increased density in the right pons, which may represent Patricia Stevenson small punctate focus of hemorrhage within the previously noted tumor. Otherwise unchanged tumor in the right temporal lobe, cerebral peduncle, midbrain, and pons. Electronically Signed   By: Merilyn Baba M.D.   On: 01/07/2022 11:19   CT ABDOMEN PELVIS W CONTRAST  Result Date: 01/06/2022 CLINICAL DATA:  Abdominal pain, especially in the right upper quadrant, over the last 2 days. High-grade glioma, completed radiation therapy 1 month ago. EXAM: CT ABDOMEN AND PELVIS WITH CONTRAST TECHNIQUE:  Multidetector CT imaging of the abdomen and pelvis was performed using the standard protocol following bolus administration of intravenous contrast. RADIATION DOSE REDUCTION: This exam was performed according to the departmental dose-optimization program which includes automated exposure control, adjustment of the mA and/or kV according to patient size and/or use of iterative reconstruction technique. CONTRAST:  18m OMNIPAQUE IOHEXOL 300 MG/ML  SOLN COMPARISON:  12/26/2021 FINDINGS: Despite efforts by the technologist and patient, motion artifact is present on today's exam and could not be eliminated. This  reduces exam sensitivity and specificity. Lower chest: Dependent subsegmental atelectasis or scarring in both lower lobes, similar to the 12/26/2021 exam. Hepatobiliary: Cholecystectomy.  Otherwise unremarkable. Pancreas: Unremarkable Spleen: Unremarkable Adrenals/Urinary Tract: New abnormal right perirenal fluid. 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 Accentuated corticomedullary differentiation in the right kidney lower pole compared to the left kidney and the right kidney upper pole, suspicious for mildly delayed right kidney lower pole nephrogram. Reduced visualization of right kidney lower pole venous tributaries compared to the 12/26/2021 exam, cannot exclude thrombosis of these tributaries although the motion artifact on portal venous and delayed phase images causes some diagnostic uncertainty. On delayed phase images, parenchymal enhancement in the right kidney lower pole matches the other 2 kidneys and there is some excretion contrast from the lower pole moiety. Small bilateral hypodense lesions are technically too small to characterize although statistically likely to be benign. Urinary bladder unremarkable. Stomach/Bowel: Normal appendix. Prominent stool throughout the colon favors constipation. Vascular/Lymphatic: As noted in  the urinary tract section above, there is some reduce conspicuity of venous structures to the right kidney lower pole raising the possibility of thrombosis of venous tributaries from the right kidney lower pole. Otherwise unremarkable. Gonadal veins patent. Reproductive: Unremarkable Other: No supplemental non-categorized findings. Musculoskeletal: Right eccentric disc bulge at L5-S1. IMPRESSION: 1. At least partially duplicated right renal collecting system, with new abnormal perirenal stranding on the right indicating the right kidney is the likely cause for the patient's abdominal pain. There is some subtle additional findings including slightly earlier contrast phase in the right kidney lower pole (with greater corticomedullary differentiation) and reduced conspicuity of right renal vein structures in the lower pole compared to the upper pole and left kidney. There is also increased fullness of the right kidney lower pole structures on delayed images compared to 12/26/2021. Motion artifact complicates the assessment. The patient has known substantial thrombocytopenia and hematuria today. Possibilities given this appearance and scenario may include thrombosis of renal venous tributaries from the right kidney lower pole moiety; recent forniceal rupture of the right kidney lower pole with some resulting effacement of venous structures; or hematoma along the right kidney lower pole pelvis with effacement of venous structures. Doppler renal vascular sonography with attention to the lower pole of the right kidney may provide helpful complementary information. 2. Bibasilar scarring or subsegmental atelectasis. 3.  Prominent stool throughout the colon favors constipation. 4. Disc bulge at L5-S1. These results were called by telephone at the time of interpretation on 01/06/2022 at 2:55 pm to provider Dr. Dorie Rank , who verbally acknowledged these results. Electronically Signed   By: Van Clines M.D.   On:  01/06/2022 15:02   DG CHEST PORT 1 VIEW  Result Date: 01/06/2022 CLINICAL DATA:  [place]. EXAM: PORTABLE CHEST 1 VIEW COMPARISON:  Dec 27, 2021. FINDINGS: The heart size and mediastinal contours are within normal limits. Both lungs are clear. The visualized skeletal structures are unremarkable. IMPRESSION: No active disease. Electronically Signed   By: Marijo Conception M.D.   On: 01/06/2022 17:41   VAS US RENAL ARTERY DUPLEX  Result Date: 01/06/2022 ABDOMINAL VISCERAL Patient Name:  SKYANNE WELLE  Date of Exam:   01/06/2022 Medical Rec #: [id number]     Accession #:    [id number] Date of Birth: 29-Apr-1961    Patient Gender: F Patient Age:   89 years Exam Location:  [facility] Procedure:      VAS  US RENAL ARTERY DUPLEX Referring Phys: [id number] JON KNAPP -------------------------------------------------------------------------------- Indications: Suspicion for renal vein thrombosis on CT Limitations: Air/bowel gas, obesity and patient immobility. Comparison Study: 01-06-2022 CT abdomen pelvis with contrast Performing Technologist: Rogelia Rohrer RVT, RDMS  Examination Guidelines: Dametrius Sanjuan complete evaluation includes B-mode imaging, spectral Doppler, color Doppler, and power Doppler as needed of all accessible portions of each vessel. Bilateral testing is considered an integral part of Michiah Masse complete examination. Limited examinations for reoccurring indications may be performed as noted.  Duplex Findings:  Summary: Renal:  Bilateral: IVC and renal veins are patent bilaterally. Right renal            vein patent prox, mid, distal, and at hilum, Left renal            vein patent distal and at hilum. Limited visualization at            prox and mid segments.  *See table(s) above for measurements and observations.  Diagnosing physician: Jamelle Haring  Electronically signed by Jamelle Haring on 01/06/2022 at 9:28:42 PM.    Final         Scheduled Meds:  sodium chloride   Intravenous Once   dexamethasone  4 mg Oral BID WC    famotidine  20 mg Oral BID   levETIRAcetam  500 mg Oral BID   Tbo-filgastrim (GRANIX) SQ  300 mcg Subcutaneous q1800   Continuous Infusions:  sodium chloride     ceFEPime (MAXIPIME) IV       LOS: 1 day    Time spent: over 30 min    Fayrene Helper, MD [facility]   To contact the attending provider between 7A-7P or the covering provider during after hours 7P-7A, please log into the web site [email or web address] and access using universal Patricia Stevenson password for that web site. If you do not have the password, please call the hospital operator.  01/07/2022, 6:13 PM

## 2022-01-07 NOTE — Telephone Encounter (Signed)
Spoke to [place]. She is aware.

## 2022-01-07 NOTE — Assessment & Plan Note (Signed)
Replace and follow. ?

## 2022-01-07 NOTE — Consult Note (Signed)
[facility] Consult Note  Patient Care Team: Isaac Bliss, Rayford Halsted, MD as PCP - General (Internal Medicine) Janina Mayo, MD as PCP - Cardiology (Cardiology)  CHIEF COMPLAINTS/PURPOSE OF CONSULTATION:  High Grade Glioma  HISTORY OF PRESENTING ILLNESS:  Patricia Stevenson 61 y.o. female presented to medical attention with new onset flank pain.  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  At present, her pain is much improved.  She has no novel complaints.  Husband is at bedside helping to translate.    MEDICAL HISTORY:  Past Medical History:  Diagnosis Date   GERD (gastroesophageal reflux disease)    Hyperlipidemia    Vitamin D deficiency     SURGICAL HISTORY: Past Surgical History:  Procedure Laterality Date   APPLICATION OF CRANIAL NAVIGATION Right 09/15/2021   Procedure: APPLICATION OF CRANIAL NAVIGATION;  Surgeon: Vallarie Mare, MD;  Location: [place];  Service: Neurosurgery;  Laterality: Right;   BREAST BIOPSY Left    20 years ago in [place]- benign- not sure if bx or cyst   CHOLECYSTECTOMY     Laparoscopic cholecystectomy 04/29/2021   CRANIOTOMY Right 09/15/2021   Procedure: Right Craniotomy Temporal Lobectomy with Brain Lab;  Surgeon: Vallarie Mare, MD;  Location: [place];  Service: Neurosurgery;  Laterality: Right;    SOCIAL HISTORY: Social History   Socioeconomic History   Marital status: Married    Spouse name: Not on file   Number of children: Not on file   Years of education: Not on file   Highest education level: Not on file  Occupational History   Not on file  Tobacco Use   Smoking status: Never   Smokeless tobacco: Never  Vaping Use   Vaping Use: Not on file  Substance and Sexual Activity   Alcohol use: No   Drug use: No   Sexual activity: Not on file   Other Topics Concern   Not on file  Social History Narrative   Not on file   Social Determinants of Health   Financial Resource Strain: Not on file  Food Insecurity: Not on file  Transportation Needs: Not on file  Physical Activity: Not on file  Stress: Not on file  Social Connections: Not on file  Intimate Partner Violence: Not on file    FAMILY HISTORY: Family History  Problem Relation Age of Onset   Breast cancer Neg Hx    CAD Neg Hx     ALLERGIES:  is allergic to latex.  MEDICATIONS:  Current Facility-Administered Medications  Medication Dose Route Frequency Provider Last Rate Last Admin   0.9 %  sodium chloride infusion (Manually program via Guardrails IV Fluids)   Intravenous Once Elodia Florence., MD       0.9 %  sodium chloride infusion   Intravenous Continuous Elodia Florence., MD       acetaminophen (TYLENOL) tablet 650 mg  650 mg Oral Q6H PRN Elodia Florence., MD       Or   acetaminophen (TYLENOL) suppository 650 mg  650 mg Rectal Q6H PRN Elodia Florence., MD       ceFEPIme (MAXIPIME) 2 g in sodium chloride 0.9 % 100 mL IVPB  2 g Intravenous Q12H Lenis Noon, RPH       dexamethasone (DECADRON) tablet 4 mg  4 mg  Oral BID WC Elodia Florence., MD   4 mg at 01/07/22 1633   famotidine (PEPCID) tablet 20 mg  20 mg Oral BID Elodia Florence., MD   20 mg at 01/07/22 0935   levETIRAcetam (KEPPRA) tablet 500 mg  500 mg Oral BID Elodia Florence., MD   500 mg at 01/07/22 0935   lidocaine (LIDODERM) 5 % 1 patch  1 patch Transdermal Daily PRN Elodia Florence., MD       ondansetron [facility]) tablet 4 mg  4 mg Oral Q6H PRN Elodia Florence., MD       Or   ondansetron [facility]) injection 4 mg  4 mg Intravenous Q6H PRN Elodia Florence., MD       oxyCODONE (Oxy IR/ROXICODONE) immediate release tablet 5 mg  5 mg Oral Q4H PRN Elodia Florence., MD       oxyCODONE (Oxy IR/ROXICODONE) immediate release tablet 5 mg  5 mg  Oral Q4H PRN Elodia Florence., MD       polyethylene glycol (MIRALAX / GLYCOLAX) packet 17 g  17 g Oral Daily PRN Elodia Florence., MD        REVIEW OF SYSTEMS:   Deferred secondary to cognitive impairment   PHYSICAL EXAMINATION: Vitals:   01/07/22 0225 01/07/22 0336  BP: (!) 81/52 132/76  Pulse: (!) 54 (!) 59  Resp: 16 14  Temp: 97.7 F (36.5 C) 98 F (36.7 C)  SpO2: 98% 97%   KPS: 60.  General: Alert, cooperative, pleasant, in no acute distress Head: Craniotomy scar EENT: No conjunctival injection or scleral icterus.  Lungs: Resp effort normal Cardiac: Regular rate Abdomen: Non-distended abdomen Skin: No rashes cyanosis or petechiae. Extremities: No clubbing or edema   Neurologic Exam: Mental Status: Awake, alert, attentive to examiner. Oriented to self and environment. Language is impaired with regards to fluency, comprehension. Elements of neglect, agnosia.  Impaired insight. Cranial Nerves: Visual acuity is grossly normal. Visual fields are full. 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 Face is symmetric Motor: Tone and bulk are normal. Power is 4/5 in left arm and 4/5 left leg. Reflexes are symmetric, no pathologic reflexes present.  Sensory: Intact to light touch Gait: Non ambulatory today   LABORATORY DATA:  I have reviewed the data as listed Lab Results  Component Value Date   WBC 0.7 (LL) 01/07/2022   HGB 9.4 (L) 01/07/2022   HCT 26.9 (L) 01/07/2022   MCV 94.1 01/07/2022   PLT 122 (L) 01/07/2022   Recent Labs    10/28/21 0406 11/12/21 1137 12/29/21 0447 12/30/21 0440 01/02/22 0540 01/06/22 1148 01/07/22 0558  NA 135   < > 134*   < > 132* 132* 135  K 3.6   < > 3.4*   < > 3.5 4.4 2.9*  CL 100   < > 100   < > 102 98 102  CO2 25   < > 25   < > 21* 25 25  GLUCOSE 104*   < > 136*   < > 164* 108* 80  BUN 12   < > 17   < > 15 24* 19  CREATININE 0.51   < > 0.42*   < > 0.43* 0.39* 0.33*  CALCIUM 8.7*   < > 8.1*   < > 8.0* 8.7*  8.1*  GFRNONAA >60   < > >60   < > >60 >60 >60  PROT 6.6  6.7   < > 4.9*  --   --  6.1* 5.0*  ALBUMIN 3.6  3.8   < > 2.6*   < > 2.7* 3.4* 2.7*  AST 15  15   < > 26  --   --  31 22  ALT 21  23   < > 86*  --   --  69* 55*  ALKPHOS 81  79   < > 44  --   --  57 42  BILITOT 0.5  0.2*   < > 0.5  --   --  0.6 0.8  0.9  BILIDIR <0.1  --   --   --   --   --  0.1  IBILI NOT CALCULATED  --   --   --   --   --  0.7   < > = values in this interval not displayed.    RADIOGRAPHIC STUDIES: I have personally reviewed the radiological images as listed and agreed with the findings in the report. CT HEAD WO CONTRAST (5MM)  Result Date: 01/07/2022 CLINICAL DATA:  Mental status change, unknown cause EXAM: CT HEAD WITHOUT CONTRAST TECHNIQUE: Contiguous axial images were obtained from the base of the skull through the vertex without intravenous contrast. RADIATION DOSE REDUCTION: This exam was performed according to the departmental dose-optimization program which includes automated exposure control, adjustment of the mA and/or kV according to patient size and/or use of iterative reconstruction technique. COMPARISON:  12/26/2021 CT head, 12/27/2021 MRI head FINDINGS: Brain: Redemonstrated right temporal lobe resection cavity and encephalomalacia. Redemonstrated mildly hyperdense tumor in the region of the right temporal lobe, cerebral peduncle, midbrain, and pons, which appears overall unchanged from the prior exam; more punctate focus of increased density in the right pons (series 2, image 8), which may represent a small punctate focus of hemorrhage. No definite acute infarct, mass effect, or midline shift. No hydrocephalus or extra-axial collection. Periventricular white matter changes, likely the sequela of chronic small vessel ischemic disease. Vascular: No hyperdense vessel. Skull: Right frontotemporal craniotomy. No acute fracture or suspicious osseous lesion. Sinuses/Orbits: No acute finding. Other: The  mastoids are well aerated. IMPRESSION: Punctate focus of increased density in the right pons, which may represent a small punctate focus of hemorrhage within the previously noted tumor. Otherwise unchanged tumor in the right temporal lobe, cerebral peduncle, midbrain, and pons. Electronically Signed   By: Merilyn Baba M.D.   On: 01/07/2022 11:19   CT Head Wo Contrast  Result Date: 12/26/2021 CLINICAL DATA:  Altered mental status EXAM: CT HEAD WITHOUT CONTRAST TECHNIQUE: Contiguous axial images were obtained from the base of the skull through the vertex without intravenous contrast. RADIATION DOSE REDUCTION: This exam was performed according to the departmental dose-optimization program which includes automated exposure control, adjustment of the mA and/or kV according to patient size and/or use of iterative reconstruction technique. COMPARISON:  None Available. FINDINGS: Brain: Right temporal lobe encephalomalacia at site of prior tumor resection. There is hyperdensity near the right cerebral peduncle corresponding to previously demonstrated area of tumor. No acute hemorrhage or extra-axial collection. Generalized volume loss with periventricular white matter changes. Vascular: No abnormal hyperdensity of the major intracranial arteries or dural venous sinuses. No intracranial atherosclerosis. Skull: Remote right craniotomy. Sinuses/Orbits: No fluid levels or advanced mucosal thickening of the visualized paranasal sinuses. No mastoid or middle ear effusion. The orbits are normal. IMPRESSION: 1. No acute intracranial abnormality. 2. Right temporal lobe encephalomalacia at site of prior tumor resection. Electronically  Signed   By: Ulyses Jarred M.D.   On: 12/26/2021 23:07   CT Angio Chest PE W and/or Wo Contrast  Result Date: 12/16/2021 CLINICAL DATA:  Shortness of breath and left lower extremity edema. Brain tumor. EXAM: CT ANGIOGRAPHY CHEST WITH CONTRAST TECHNIQUE: Multidetector CT imaging of the chest was  performed using the standard protocol during bolus administration of intravenous contrast. Multiplanar CT image reconstructions and MIPs were obtained to evaluate the vascular anatomy. RADIATION DOSE REDUCTION: This exam was performed according to the departmental dose-optimization program which includes automated exposure control, adjustment of the mA and/or kV according to patient size and/or use of iterative reconstruction technique. CONTRAST:  96m OMNIPAQUE IOHEXOL 350 MG/ML SOLN COMPARISON:  None. FINDINGS: Cardiovascular: Negative for pulmonary embolus. Atherosclerotic calcification of the aorta. Enlarged pulmonic trunk. Heart is at the upper limits of normal in size. No pericardial effusion. Mediastinum/Nodes: No pathologically enlarged mediastinal, hilar or axillary lymph nodes. Esophagus is grossly unremarkable. Lungs/Pleura: Ground-glass is seen in both lower lobes and may be due to slight expiratory phase imaging. No dense airspace consolidation. No pleural fluid. Airway is unremarkable. Upper Abdomen: Visualized portions of the liver, adrenal glands, kidneys, spleen, pancreas, stomach and bowel are grossly unremarkable. Musculoskeletal: Degenerative changes in the spine. No worrisome lytic or sclerotic lesions. Review of the MIP images confirms the above findings. IMPRESSION: 1. Negative for pulmonary embolus. 2. No definitive findings to explain the patient's clinical history. 3.  Aortic atherosclerosis (ICD10-I70.0). 4. Enlarged pulmonic trunk, indicative of pulmonary arterial hypertension. Electronically Signed   By: MLorin PicketM.D.   On: 12/16/2021 16:10   MR BRAIN W WO CONTRAST  Result Date: 12/27/2021 CLINICAL DATA:  61year old female with high-grade glioma status post subtotal resection. Altered mental status. EXAM: MRI HEAD WITHOUT AND WITH CONTRAST TECHNIQUE: Multiplanar, multiecho pulse sequences of the brain and surrounding structures were obtained without and with intravenous  contrast. CONTRAST:  531mGADAVIST GADOBUTROL 1 MMOL/ML IV SOLN COMPARISON:  Head CT yesterday. Restaging brain MRI 10/27/2021 and earlier. FINDINGS: Brain: Rounded, masslike enhancement at the right midbrain and cerebral peduncle has regressed and is now indistinct on series 16, image 57. However, nearby Patchy and confluent enhancement at the right optic radiations appears increased and is not obviously vascular related (series 16, image 58 and also postcontrast coronal series 19, image 16). Regional temporal lobe resection cavity has not significantly changed in size or configuration. Stable associated hemosiderin. And confluent regional T2 and FLAIR hyperintensity is stable to mildly improved. However, there is new discontinuous T2/FLAIR abnormality in the posteromedial right thalamus on series 11, image 25. And ongoing discontinuous although improved T2 hyperintensity in the right brainstem at the pons (series 8, image 7). Additionally, confluent right corona radiata T2 and FLAIR hyperintensity has progressed since March but is facilitated on diffusion (series 11, image 36), nonenhancing. On DWI heterogeneity in the tumor region and right brainstem is not significantly changed. Post craniotomy dural thickening along the right middle cranial fossa is stable. No midline shift. Stable ventricle size and configuration. No restricted diffusion suggestive of acute infarction. No acute intracranial hemorrhage. Cervicomedullary junction and pituitary are within normal limits. Stable gray and white matter signal elsewhere. Vascular: Major intracranial vascular flow voids are stable. The major dural venous sinuses are enhancing and appear to be patent. Skull and upper cervical spine: Negative visible cervical spine and spinal cord. Stable bone marrow signal. Sinuses/Orbits: Stable and negative. Other: Visible internal auditory structures appear normal. Stable scalp and face. IMPRESSION: 1.  Mixed imaging response to  therapy since March: Regressed enhancement in the right midbrain, but new petechial enhancement in the right optic radiations. New discontinuous T2/FLAIR hyperintensity in both the medial right thalamus and right corona radiata. Otherwise essentially stable appearance of infiltrating tumor in the right hemisphere and brainstem. 2. No new intracranial abnormality. Electronically Signed   By: Genevie Ann M.D.   On: 12/27/2021 09:09   CT ABDOMEN PELVIS W CONTRAST  Result Date: 01/06/2022 CLINICAL DATA:  Abdominal pain, especially in the right upper quadrant, over the last 2 days. High-grade glioma, completed radiation therapy 1 month ago. EXAM: CT ABDOMEN AND PELVIS WITH CONTRAST TECHNIQUE: Multidetector CT imaging of the abdomen and pelvis was performed using the standard protocol following bolus administration of intravenous contrast. RADIATION DOSE REDUCTION: This exam was performed according to the departmental dose-optimization program which includes automated exposure control, adjustment of the mA and/or kV according to patient size and/or use of iterative reconstruction technique. CONTRAST:  121m OMNIPAQUE IOHEXOL 300 MG/ML  SOLN COMPARISON:  12/26/2021 FINDINGS: Despite efforts by the technologist and patient, motion artifact is present on today's exam and could not be eliminated. This reduces exam sensitivity and specificity. Lower chest: Dependent subsegmental atelectasis or scarring in both lower lobes, similar to the 12/26/2021 exam. Hepatobiliary: Cholecystectomy.  Otherwise unremarkable. Pancreas: Unremarkable Spleen: Unremarkable Adrenals/Urinary Tract: New abnormal right perirenal fluid. 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 Accentuated corticomedullary differentiation in the right kidney lower pole compared to the left kidney and the right kidney upper pole, suspicious for mildly delayed right kidney lower pole nephrogram.  Reduced visualization of right kidney lower pole venous tributaries compared to the 12/26/2021 exam, cannot exclude thrombosis of these tributaries although the motion artifact on portal venous and delayed phase images causes some diagnostic uncertainty. On delayed phase images, parenchymal enhancement in the right kidney lower pole matches the other 2 kidneys and there is some excretion contrast from the lower pole moiety. Small bilateral hypodense lesions are technically too small to characterize although statistically likely to be benign. Urinary bladder unremarkable. Stomach/Bowel: Normal appendix. Prominent stool throughout the colon favors constipation. Vascular/Lymphatic: As noted in the urinary tract section above, there is some reduce conspicuity of venous structures to the right kidney lower pole raising the possibility of thrombosis of venous tributaries from the right kidney lower pole. Otherwise unremarkable. Gonadal veins patent. Reproductive: Unremarkable Other: No supplemental non-categorized findings. Musculoskeletal: Right eccentric disc bulge at L5-S1. IMPRESSION: 1. At least partially duplicated right renal collecting system, with new abnormal perirenal stranding on the right indicating the right kidney is the likely cause for the patient's abdominal pain. There is some subtle additional findings including slightly earlier contrast phase in the right kidney lower pole (with greater corticomedullary differentiation) and reduced conspicuity of right renal vein structures in the lower pole compared to the upper pole and left kidney. There is also increased fullness of the right kidney lower pole structures on delayed images compared to 12/26/2021. Motion artifact complicates the assessment. The patient has known substantial thrombocytopenia and hematuria today. Possibilities given this appearance and scenario may include thrombosis of renal venous tributaries from the right kidney lower pole moiety;  recent forniceal rupture of the right kidney lower pole with some resulting effacement of venous structures; or hematoma along the right kidney lower pole pelvis with effacement of venous structures. Doppler renal vascular sonography with attention to the lower pole of the right kidney may  provide helpful complementary information. 2. Bibasilar scarring or subsegmental atelectasis. 3.  Prominent stool throughout the colon favors constipation. 4. Disc bulge at L5-S1. These results were called by telephone at the time of interpretation on 01/06/2022 at 2:55 pm to provider Dr. Dorie Rank , who verbally acknowledged these results. Electronically Signed   By: Van Clines M.D.   On: 01/06/2022 15:02   CT Abdomen Pelvis W Contrast  Result Date: 12/26/2021 CLINICAL DATA:  Abdominal pain. EXAM: CT ABDOMEN AND PELVIS WITH CONTRAST TECHNIQUE: Multidetector CT imaging of the abdomen and pelvis was performed using the standard protocol following bolus administration of intravenous contrast. RADIATION DOSE REDUCTION: This exam was performed according to the departmental dose-optimization program which includes automated exposure control, adjustment of the mA and/or kV according to patient size and/or use of iterative reconstruction technique. CONTRAST:  156m OMNIPAQUE IOHEXOL 300 MG/ML  SOLN COMPARISON:  CT abdomen pelvis dated 06/10/2021. FINDINGS: Lower chest: Bibasilar linear atelectasis. No intra-abdominal free air or free fluid. Hepatobiliary: The liver is unremarkable. No intrahepatic biliary dilatation. Cholecystectomy. Pancreas: Unremarkable. No pancreatic ductal dilatation or surrounding inflammatory changes. Spleen: Normal in size without focal abnormality. Adrenals/Urinary Tract: The adrenal glands unremarkable the kidneys, visualized ureters, and urinary bladder appear unremarkable. Stomach/Bowel: There is moderate stool throughout the colon. There is no bowel obstruction or active inflammation. The appendix  is normal. Vascular/Lymphatic: The abdominal aorta and IVC are unremarkable. No portal venous gas. There is no adenopathy. Reproductive: The uterus and ovaries are grossly unremarkable. No pelvic mass. Other: None Musculoskeletal: No acute or significant osseous findings. IMPRESSION: 1. No acute intra-abdominal or pelvic pathology. 2. Moderate colonic stool burden. No bowel obstruction. Normal appendix. Electronically Signed   By: AAnner CreteM.D.   On: 12/26/2021 23:22   DG CHEST PORT 1 VIEW  Result Date: 01/06/2022 CLINICAL DATA:  [place]. EXAM: PORTABLE CHEST 1 VIEW COMPARISON:  Dec 27, 2021. FINDINGS: The heart size and mediastinal contours are within normal limits. Both lungs are clear. The visualized skeletal structures are unremarkable. IMPRESSION: No active disease. Electronically Signed   By: JMarijo ConceptionM.D.   On: 01/06/2022 17:41   Portable chest 1 View  Result Date: 12/27/2021 CLINICAL DATA:  Generalized weakness. EXAM: PORTABLE CHEST 1 VIEW COMPARISON:  Chest radiograph dated 12/16/2021 and CT dated 12/16/2021 FINDINGS: Minimal left lung base atelectasis. No focal consolidation, pleural effusion, pneumothorax. Stable cardiac silhouette. No acute osseous pathology. IMPRESSION: No active disease. Electronically Signed   By: AAnner CreteM.D.   On: 12/27/2021 00:47   DG Chest Port 1 View  Result Date: 12/16/2021 CLINICAL DATA:  Shortness of breath EXAM: PORTABLE CHEST 1 VIEW COMPARISON:  None. FINDINGS: The heart size and mediastinal contours are within normal limits. Both lungs are clear. The visualized skeletal structures are unremarkable. IMPRESSION: No acute abnormality of the lungs in AP portable projection. Electronically Signed   By: ADelanna AhmadiM.D.   On: 12/16/2021 15:26   VAS [place]OWER EXTREMITY VENOUS (DVT) (ONLY [facility] & [facility])  Result Date: 12/16/2021  Lower Venous DVT Study Patient Name:  MLOUVINA CLEARY Date of Exam:   12/16/2021 Medical Rec #: [id number]    Accession #:     [id number] of Birth: [birth year]   Patient Gender: F Patient Age:   89years [facility] [place]OWER EXTREMITY VENOUS (DVT) Referring Phys: JOSEPH ZAMMIT --------------------------------------------------------------------------------  Indications: SOB, and LLE edema.  Risk Factors: Chemotherapy. Comparison Study: No previous exams Performing Technologist: Jody Hill RVT, RDMS  Examination Guidelines: A complete evaluation includes B-mode imaging, spectral Doppler, color Doppler, and power Doppler as needed of all accessible portions of each vessel. Bilateral testing is considered an integral part of a complete examination. Limited examinations for reoccurring indications may be performed as noted. The reflux portion of the exam is performed with the patient in reverse Trendelenburg.  +---------+---------------+---------+-----------+----------+--------------+ RIGHT    CompressibilityPhasicitySpontaneityPropertiesThrombus Aging +---------+---------------+---------+-----------+----------+--------------+ CFV      Full           Yes      Yes                                 +---------+---------------+---------+-----------+----------+--------------+ SFJ      Full                                                        +---------+---------------+---------+-----------+----------+--------------+ FV Prox  Full           Yes      Yes                                 +---------+---------------+---------+-----------+----------+--------------+ FV Mid   Full           Yes      Yes                                 +---------+---------------+---------+-----------+----------+--------------+ FV DistalFull           Yes      Yes                                 +---------+---------------+---------+-----------+----------+--------------+ PFV      Full                                                         +---------+---------------+---------+-----------+----------+--------------+ POP      Full           Yes      Yes                                 +---------+---------------+---------+-----------+----------+--------------+ PTV      Full                                                        +---------+---------------+---------+-----------+----------+--------------+ PERO     Full                                                        +---------+---------------+---------+-----------+----------+--------------+   +---------+---------------+---------+-----------+----------+--------------+  LEFT     CompressibilityPhasicitySpontaneityPropertiesThrombus Aging +---------+---------------+---------+-----------+----------+--------------+ CFV      Full           Yes      Yes                                 +---------+---------------+---------+-----------+----------+--------------+ SFJ      Full                                                        +---------+---------------+---------+-----------+----------+--------------+ FV Prox  Full           Yes      Yes                                 +---------+---------------+---------+-----------+----------+--------------+ FV Mid   Full           Yes      Yes                                 +---------+---------------+---------+-----------+----------+--------------+ FV DistalFull           Yes      Yes                                 +---------+---------------+---------+-----------+----------+--------------+ PFV      Full                                                        +---------+---------------+---------+-----------+----------+--------------+ POP      Full           Yes      Yes                                 +---------+---------------+---------+-----------+----------+--------------+ PTV      Full                                                         +---------+---------------+---------+-----------+----------+--------------+ PERO     Full                                                        +---------+---------------+---------+-----------+----------+--------------+     Summary: BILATERAL: - No evidence of deep vein thrombosis seen in the lower extremities, bilaterally. - No evidence of superficial venous thrombosis in the lower extremities, bilaterally. -No evidence of popliteal cyst, bilaterally.  LEFT: - subcutaneous edema seen in area of popliteal fossa and calf.  *See table(s) above for measurements and observations. Electronically signed by Harold Barban MD on  12/16/2021 at 8:14:16 PM.    Final    VAS US RENAL ARTERY DUPLEX  Result Date: 01/06/2022 ABDOMINAL VISCERAL Patient Name:  DULA HAVLIK  Date of Exam:   01/06/2022 Medical Rec #: [id number]     Accession #:    [id number] Date of Birth: 1960/11/18    Patient Gender: F Patient Age:   89 years Exam Location:  [facility] Procedure:      VAS US RENAL ARTERY DUPLEX Referring Phys: [id number] JON KNAPP -------------------------------------------------------------------------------- Indications: Suspicion for renal vein thrombosis on CT Limitations: Air/bowel gas, obesity and patient immobility. Comparison Study: 01-06-2022 CT abdomen pelvis with contrast Performing Technologist: Rogelia Rohrer RVT, RDMS  Examination Guidelines: A complete evaluation includes B-mode imaging, spectral Doppler, color Doppler, and power Doppler as needed of all accessible portions of each vessel. Bilateral testing is considered an integral part of a complete examination. Limited examinations for reoccurring indications may be performed as noted.  Duplex Findings:  Summary: Renal:  Bilateral: IVC and renal veins are patent bilaterally. Right renal            vein patent prox, mid, distal, and at hilum, Left renal            vein patent distal and at hilum. Limited visualization at            prox and mid segments.  *See  table(s) above for measurements and observations.  Diagnosing physician: Jamelle Haring  Electronically signed by Jamelle Haring on 01/06/2022 at 9:28:42 PM.    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    At present, no intervention is planned, and pain is better controlled.    Engaged in a goals of care conversation with the patient and her husband.  We again shared the very limited options for treatment given her bone marrow issues.  In fact there are no standard treatments remaining which could provide mortality benefit for her condition.  They would still like to take some days to discuss their preferred path forward.  They have not consented yet to hospice but would be open with visiting with palliative care.    Ok with non invasive treatments and interventions at this time, including Granix if recommended by heme.  Will con't to follow.    All questions were answered. The patient knows to call the clinic with any problems, questions or concerns.  The total time spent in the encounter was 55 minutes and more than 50% was on counseling and review of test results     Ventura Sellers, MD 01/07/2022 5:08 PM

## 2022-01-07 NOTE — Progress Notes (Signed)
Pharmacy Antibiotic Note  Patricia Stevenson is a 61 y.o. female admitted on 01/06/2022 with flank pain and pancytopenia. PMH significant for high grade glioma.  Pharmacy has been consulted for cefepime dosing for UTI.  Today, 01/07/22 -WBC/ANC low -SCr low, TBW 54 kg. CrCl ~55 mL/min with SCr rounded to 1 -Afebrile  Plan: Cefepime 2 g IV q12h Follow culture data, renal function     Temp (24hrs), Avg:98 F (36.7 C), Min:97.6 F (36.4 C), Max:98.3 F (36.8 C)  Recent Labs  Lab 01/01/22 0444 01/02/22 0540 01/06/22 1148 01/07/22 0558  WBC 2.8* 2.4* 1.6* 0.7*  CREATININE 0.38* 0.43* 0.39* 0.33*    Estimated Creatinine Clearance: 55.1 mL/min (A) (by C-G formula based on SCr of 0.33 mg/dL (L)).    Allergies  Allergen Reactions   Latex Rash    Antimicrobials this admission: cefepime 5/18 >>   Dose adjustments this admission:  Microbiology results: 5/17 UCx:    Lenis Noon, PharmD 01/07/2022 11:58 AM

## 2022-01-07 NOTE — Assessment & Plan Note (Signed)
abx as above

## 2022-01-07 NOTE — Hospital Course (Addendum)
Patricia Stevenson is Patricia Stevenson 61 y.o. 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   She's had 2 days of R sided flank pain, woke up last night screaming in pain.  Lasted minutes, then recurred.  Recurred [name] 3rd time around 10 am this morning.  Denies trauma, fevers, change in urinary habits.  She's been unable to walk for about 2 weeks.  She's been more confused for 3-4 weeks.  She was recently discharged after an admission for sepsis/neutropenic fever without clear infectious source identified.  She was treated with broad spectrum abx which were ultimately discontinued.  She was given granix x1 on 5/10.  She was also transfused with platelets for thrombocytopenia.   She's here today again due to pain.  Of note, Dr. Mickeal Skinner had discussion with the patient/family regarding her recent decline and they'd began to discuss goals of care.  She was admitted with flank pain, CT scan showed perirenal stranding on the right as well as other abnormal findings concerning for thrombosis of renal venous tributaries, recent forniceal rupture, or hematoma along the right kidney lower pole pelvis with effacement of the venous structures.  She was found to have [name] UTI and treated with cefepime.  She was discharged on bactrim.  He abdominal discomfort had improved by the time of discharge.  She was seen by Dr. Mickeal Skinner who discussed that she had limited options and no treatments remaining that could provide mortality benefit, palliative care was consulted and has been following.  Plan for palliative to follow outpatient.  See below for additional details

## 2022-01-08 ENCOUNTER — Encounter: Payer: 59 | Admitting: Occupational Therapy

## 2022-01-08 ENCOUNTER — Ambulatory Visit: Payer: 59 | Admitting: Physical Therapy

## 2022-01-08 DIAGNOSIS — Z7189 Other specified counseling: Secondary | ICD-10-CM

## 2022-01-08 DIAGNOSIS — C719 Malignant neoplasm of brain, unspecified: Secondary | ICD-10-CM

## 2022-01-08 DIAGNOSIS — R5381 Other malaise: Secondary | ICD-10-CM | POA: Diagnosis not present

## 2022-01-08 DIAGNOSIS — Z515 Encounter for palliative care: Secondary | ICD-10-CM

## 2022-01-08 DIAGNOSIS — R109 Unspecified abdominal pain: Secondary | ICD-10-CM | POA: Diagnosis not present

## 2022-01-08 LAB — PREPARE PLATELET PHERESIS
Unit division: 0
Unit division: 0

## 2022-01-08 LAB — CBC WITH DIFFERENTIAL/PLATELET
Abs Immature Granulocytes: 0.1 10*3/uL — ABNORMAL HIGH (ref 0.00–0.07)
Band Neutrophils: 8 %
Basophils Absolute: 0 10*3/uL (ref 0.0–0.1)
Basophils Relative: 0 %
Eosinophils Absolute: 0 10*3/uL (ref 0.0–0.5)
Eosinophils Relative: 0 %
HCT: 30.1 % — ABNORMAL LOW (ref 36.0–46.0)
Hemoglobin: 10.4 g/dL — ABNORMAL LOW (ref 12.0–15.0)
Lymphocytes Relative: 5 %
Lymphs Abs: 0.1 10*3/uL — ABNORMAL LOW (ref 0.7–4.0)
MCH: 32.9 pg (ref 26.0–34.0)
MCHC: 34.6 g/dL (ref 30.0–36.0)
MCV: 95.3 fL (ref 80.0–100.0)
Monocytes Absolute: 0 10*3/uL — ABNORMAL LOW (ref 0.1–1.0)
Monocytes Relative: 2 %
Myelocytes: 3 %
Neutro Abs: 2.2 10*3/uL (ref 1.7–7.7)
Neutrophils Relative %: 82 %
Platelets: 99 10*3/uL — ABNORMAL LOW (ref 150–400)
RBC: 3.16 MIL/uL — ABNORMAL LOW (ref 3.87–5.11)
RDW: 17.1 % — ABNORMAL HIGH (ref 11.5–15.5)
WBC: 2.4 10*3/uL — ABNORMAL LOW (ref 4.0–10.5)
nRBC: 1.6 % — ABNORMAL HIGH (ref 0.0–0.2)

## 2022-01-08 LAB — BASIC METABOLIC PANEL
Anion gap: 8 (ref 5–15)
BUN: 19 mg/dL (ref 6–20)
CO2: 21 mmol/L — ABNORMAL LOW (ref 22–32)
Calcium: 8.4 mg/dL — ABNORMAL LOW (ref 8.9–10.3)
Chloride: 106 mmol/L (ref 98–111)
Creatinine, Ser: 0.33 mg/dL — ABNORMAL LOW (ref 0.44–1.00)
GFR, Estimated: >60 mL/min (ref >60)
Glucose, Bld: 120 mg/dL — ABNORMAL HIGH (ref 70–99)
Potassium: 4.1 mmol/L (ref 3.5–5.1)
Sodium: 135 mmol/L (ref 135–145)

## 2022-01-08 LAB — BPAM PLATELET PHERESIS
Blood Product Expiration Date: 202305202359
Blood Product Expiration Date: 202305212359
ISSUE DATE / TIME: 202305172327
ISSUE DATE / TIME: 202305180203
Unit Type and Rh: 5100
Unit Type and Rh: 7300

## 2022-01-08 NOTE — Progress Notes (Signed)
PROGRESS NOTE    Patricia Stevenson  KPT:[id number] DOB: 24-Feb-1961 DOA: 01/06/2022 PCP: Isaac Bliss, Rayford Halsted, MD  Chief Complaint  Patient presents with   Abdominal Pain    Brief Narrative:  Patricia Stevenson is Patricia Stevenson 61 y.o. 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   She's had 2 days of R sided flank pain, woke up last night screaming in pain.  Lasted minutes, then recurred.  Recurred Patricia Stevenson 3rd time around 10 am this morning.  Denies trauma, fevers, change in urinary habits.  She's been unable to walk for about 2 weeks.  She's been more confused for 3-4 weeks.  She was recently discharged after an admission for sepsis/neutropenic fever without clear infectious source identified.  She was treated with broad spectrum abx which were ultimately discontinued.  She was given granix x1 on 5/10.  She was also transfused with platelets for thrombocytopenia.   She's here today again due to pain.  Of note, Dr. Mickeal Skinner had discussion with the patient/family regarding her recent decline and they'd began to discuss goals of care.    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)   Pancytopenia (HCC)   Acute metabolic encephalopathy   Shortness of breath   Acute pyelonephritis   SIRS    Paroxysmal atrial fibrillation (HCC)   Flat affect   Physical deconditioning   Hypokalemia   Hyponatremia   Bradycardia   Hypertension   Assessment and Plan: * Flank pain Related to abnormal CT scan  New abnormal perirenal stranding on R ddx includes thrombosis of renal venous tributaries vs recent forniceal rupture vs hematoma along right kidney lower pole pelvis with effacement of venous structures Renal artery [place] with patent IVC and renal veins bilaterally, right renal vein patent prox mid, distal and at  hiluim.  Left renal vein patent distal and at hilum, limited visualization at prox and mid segments. UA with RBC's, negative leukocyte and nitrite -> 60,000 e. Coli, pending susceptibilities Cefepime 5/18 -  S/p 2 units platelets 5/17 given possible hematoma as cause of sx and thrombocytopenia, platelets improved Discussed with urology, not need for any acute intervention from their standpoint Follow symptomatically, consider repeat imaging Pain management  Pancytopenia ([place]) Given abnormal renal imaging, concern for possible hematoma S/p platelets x2 units 5/17 ANC 2.2 today, hold further granix She has scattered petechiae and purpura    High grade glioma not classifiable by WHO criteria [facility]) S/p surgical resection and chemoradiation with temodar Currently temodar on hold per recent d/c summary Continue keppra and decadron Dr. Mickeal Skinner planning on discussion regarding goals of care, he'd broached this discussion 5/16 with family.   Appreciate Dr. Renda Rolls assistance -> open to visiting with palliative care, will consult at this time.   Acute pyelonephritis abx as above  Shortness of breath CXR without acute abnormalities Currently on RA, hold additional w/u right now given stability and plan for goc discussions with neuro oncology Continue to follow - no additional complaints today  Acute metabolic encephalopathy Delirium precautions MRI 5/7 with mixed imaging response to therapy since march -> regressed enhancement in R midbrain, but new petichial enhancement in R optic radiations, new discontinuous T2/Flair hyperintensity in both medial R thalamus and R corona radiata Head CT with punctate focus of increased density in R pons (may represent  small punctate focus of hemorrhage within previously noted tumor) - neuro oncology aware Overall mental status seems improved with abx  Paroxysmal atrial fibrillation (HCC) Sinus brady No anticoagulation with thrombocytopenia  Flat  affect Related to encephalopathy vs mood/depression Will monitor  Physical deconditioning Consider therapy eval based on discussion with neuro oncology  Hypokalemia Replace and follow  Hyponatremia mild  Bradycardia noted      DVT prophylaxis: SCD Code Status: full Family Communication: none Disposition:   Status is: Inpatient Remains inpatient appropriate because: need for IV abx, GOC. Oncology, neuro oncology, palliative care recs   Consultants:  Neuro oncology oncology  Procedures:  Summary:  Renal:     Bilateral: IVC and renal veins are patent bilaterally. Right renal             vein patent prox, mid, distal, and at hilum, Left renal             vein patent distal and at hilum. Limited visualization at             prox and mid segments.   Antimicrobials:  Anti-infectives (From admission, onward)    Start     Dose/Rate Route Frequency Ordered Stop   01/07/22 2300  ceFEPIme (MAXIPIME) 2 g in sodium chloride 0.9 % 100 mL IVPB        2 g 200 mL/hr over 30 Minutes Intravenous Every 12 hours 01/07/22 1158     01/07/22 1245  ceFEPIme (MAXIPIME) 2 g in sodium chloride 0.9 % 100 mL IVPB        2 g 200 mL/hr over 30 Minutes Intravenous NOW 01/07/22 1157 01/07/22 1308       Subjective: Patricia Stevenson, Patricia Stevenson at bedside No new complaints Spanish interpreter used  Objective: Vitals:   01/07/22 2057 01/08/22 0425 01/08/22 0428 01/08/22 1257  BP: 97/67 106/72  119/77  Pulse: (!) 56 (!) 56  71  Resp: '18 14  16  '$ Temp: 97.9 F (36.6 C) 98.2 F (36.8 C)  98.3 F (36.8 C)  TempSrc: Oral Oral  Oral  SpO2: 97% 99%  98%  Weight:   57 kg     Intake/Output Summary (Last 24 hours) at 01/08/2022 1913 Last data filed at 01/08/2022 1807 Gross per 24 hour  Intake 335 ml  Output 1350 ml  Net -1015 ml   Filed Weights   01/08/22 0428  Weight: 57 kg    Examination:  General: No acute distress. Cardiovascular: RRR Lungs: unlabored Abdomen: Soft, nontender,  nondistended  Neurological: Alert , improved today, more interactive, appropriate. Moves all extremities 4 Cranial nerves II through XII grossly intact. Extremities: No clubbing or cyanosis. No edema.  Data Reviewed: I have personally reviewed following labs and imaging studies  CBC: Recent Labs  Lab 01/02/22 0540 01/06/22 1148 01/07/22 0558 01/08/22 0531  WBC 2.4* 1.6* 0.7* 2.4*  NEUTROABS  --  1.4*  --  2.2  HGB 10.6* 12.8 9.4* 10.4*  HCT 29.9* 35.9* 26.9* 30.1*  MCV 93.4 93.5 94.1 95.3  PLT 43* 23* 122* 99*    Basic Metabolic Panel: Recent Labs  Lab 01/02/22 0540 01/06/22 1148 01/07/22 0558 01/08/22 0531  NA 132* 132* 135 135  K 3.5 4.4 2.9* 4.1  CL 102 98 102 106  CO2 21* 25 25 21*  GLUCOSE 164* 108* 80 120*  BUN 15 24* 19 19  CREATININE 0.43* 0.39* 0.33* 0.33*  CALCIUM 8.0* 8.7* 8.1* 8.4*  MG 2.4  --  2.0  --  PHOS 2.6  --   --   --     GFR: Estimated Creatinine Clearance: 60.6 mL/min (Terelle Dobler) (by C-G formula based on SCr of 0.33 mg/dL (L)).  Liver Function Tests: Recent Labs  Lab 01/02/22 0540 01/06/22 1148 01/07/22 0558  AST  --  31 22  ALT  --  69* 55*  ALKPHOS  --  57 42  BILITOT  --  0.6 0.8  0.9  PROT  --  6.1* 5.0*  ALBUMIN 2.7* 3.4* 2.7*    CBG: No results for input(s): GLUCAP in the last 168 hours.   Recent Results (from the past 240 hour(s))  Urine Culture     Status: Abnormal (Preliminary result)   Collection Time: 01/06/22  3:51 PM   Specimen: Urine, Clean Catch  Result Value Ref Range Status   Specimen Description   Final    URINE, CLEAN CATCH Performed at [facility], [place] [address] [place], [place] [address]    Special Requests   Final    NONE Performed at [facility], [place] [address], [place], [place] [address]    Culture (Cassady Stanczak)  Final    60,000 COLONIES/mL ESCHERICHIA COLI SUSCEPTIBILITIES TO FOLLOW Performed at [facility], [place] [address] [place], [place] [address]     Report Status PENDING  Incomplete         Radiology Studies: CT HEAD WO CONTRAST (5MM)  Result Date: 01/07/2022 CLINICAL DATA:  Mental status change, unknown cause EXAM: CT HEAD WITHOUT CONTRAST TECHNIQUE: Contiguous axial images were obtained from the base of the skull through the vertex without intravenous contrast. RADIATION DOSE REDUCTION: This exam was performed according to the departmental dose-optimization program which includes automated exposure control, adjustment of the mA and/or kV according to patient size and/or use of iterative reconstruction technique. COMPARISON:  12/26/2021 CT head, 12/27/2021 MRI head FINDINGS: Brain: Redemonstrated right temporal lobe resection cavity and encephalomalacia. Redemonstrated mildly hyperdense tumor in the region of the right temporal lobe, cerebral peduncle, midbrain, and pons, which appears overall unchanged from the prior exam; more punctate focus of increased density in the right pons (series 2, image 8), which may represent Mirenda Baltazar small punctate focus of hemorrhage. No definite acute infarct, mass effect, or midline shift. No hydrocephalus or extra-axial collection. Periventricular white matter changes, likely the sequela of chronic small vessel ischemic disease. Vascular: No hyperdense vessel. Skull: Right frontotemporal craniotomy. No acute fracture or suspicious osseous lesion. Sinuses/Orbits: No acute finding. Other: The mastoids are well aerated. IMPRESSION: Punctate focus of increased density in the right pons, which may represent Daeveon Zweber small punctate focus of hemorrhage within the previously noted tumor. Otherwise unchanged tumor in the right temporal lobe, cerebral peduncle, midbrain, and pons. Electronically Signed   By: Merilyn Baba M.D.   On: 01/07/2022 11:19        Scheduled Meds:  sodium chloride   Intravenous Once   dexamethasone  4 mg Oral BID WC   famotidine  20 mg Oral BID   levETIRAcetam  500 mg Oral BID   Continuous Infusions:   sodium chloride     ceFEPime (MAXIPIME) IV 2 g (01/08/22 0903)     LOS: 2 days    Time spent: over 30 min    Fayrene Helper, MD [facility]   To contact the attending provider between 7A-7P or the covering provider during after hours 7P-7A, please log into the web site [email or web address] and access using universal [place] password for that  web site. If you do not have the password, please call the hospital operator.  01/08/2022, 7:13 PM

## 2022-01-08 NOTE — Consult Note (Signed)
Consultation Note Date: 01/08/2022   Patient Name: Patricia Stevenson  DOB: 1961-05-01  MRN: [id number]  Age / Sex: 61 y.o., female  PCP: Isaac Bliss, Rayford Halsted, MD Referring Physician: Elodia Florence., *  Reason for Consultation: Establishing goals of care  HPI/Patient Profile: 61 y.o. 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  Flank pain thought to be likely peri-nephric bleeding, hematoma.  Patient also with progressive failure to thrive.  PMT consulted to discuss goals of care.  Goals of care conversations were begun by Dr. Mickeal Skinner.  Clinical Assessment and Goals of Care: I have reviewed medical records including EPIC notes, labs and imaging, received report from RN, assessed the patient and then met with patient to discuss diagnosis prognosis, GOC, EOL wishes, disposition and options.  Patient's mental status precludes goals of care conversations with her independently.  Patient's sister is only at the bedside at this time.  Patient denies pain or any discomfort during my visit.  I spoke with patient's daughter [name] via telephone.  I introduced Palliative Medicine as specialized medical care for people living with serious illness. It focuses on providing relief from the symptoms and stress of a serious illness. The goal is to improve quality of life for both the patient and the family.  [name] tells me they are open to further goals of care conversations and she would like to have these goals of care conversations with her father and patient spouse, [name].  She tells me it is unlikely that they will make any decisions until Tuesday as they would like to include patient's other sister who is out of town until Tuesday.  However she does share that goals of care conversations at this time would be  beneficial.  She tells me she is in [place] and cannot be at the bedside today or tomorrow.  She would like me to meet with [name] in person in include her in conversation via telephone.  I attempted to call [name] to schedule goals of care discussion however he did not answer -voicemail left with callback number.  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 The following aspects are pertinent.  Past Medical History:  Diagnosis Date   GERD (gastroesophageal reflux disease)    Hyperlipidemia    Vitamin D deficiency    Social History   Socioeconomic History   Marital status: Married    Spouse name: Not on file   Number of children: Not on file   Years of education: Not on file   Highest education level: Not on file  Occupational History   Not on file  Tobacco Use   Smoking status: Never   Smokeless tobacco: Never  Vaping Use   Vaping Use: Not on file  Substance and Sexual Activity   Alcohol use: No   Drug use: No  Sexual activity: Not on file  Other Topics Concern   Not on file  Social History Narrative   Not on file   Social Determinants of Health   Financial Resource Strain: Not on file  Food Insecurity: Not on file  Transportation Needs: Not on file  Physical Activity: Not on file  Stress: Not on file  Social Connections: Not on file   Family History  Problem Relation Age of Onset   Breast cancer Neg  Hx    CAD Neg Hx    Scheduled Meds:  sodium chloride   Intravenous Once   dexamethasone  4 mg Oral BID WC   famotidine  20 mg Oral BID   levETIRAcetam  500 mg Oral BID   Continuous Infusions:  sodium chloride     ceFEPime (MAXIPIME) IV 2 g (01/08/22 0903)   PRN Meds:.acetaminophen **OR** [DISCONTINUED] acetaminophen, lidocaine, ondansetron **OR** ondansetron (ZOFRAN) IV, oxyCODONE, oxyCODONE, polyethylene glycol Allergies  Allergen Reactions   Latex Rash   Review of Systems  Unable to perform ROS: Mental status change   Physical Exam Constitutional:      General: She is not in acute distress.    Appearance: She is ill-appearing.     Comments: Slightly lethargic  Pulmonary:     Effort: Pulmonary effort is normal.  Skin:    General: Skin is warm and dry.  Neurological:     Mental Status: She is disoriented.    Vital Signs: BP 119/77   Pulse 71   Temp 98.3 F (36.8 C) (Oral)   Resp 16   Wt 57 kg   SpO2 98%   BMI 24.54 kg/m  Pain Scale: 0-10   Pain Score: 0-No pain   SpO2: SpO2: 98 % O2 Device:SpO2: 98 % O2 Flow Rate: .O2 Flow Rate (L/min): 0 L/min  IO: Intake/output summary:  Intake/Output Summary (Last 24 hours) at 01/08/2022 1546 Last data filed at 01/08/2022 0900 Gross per 24 hour  Intake 435 ml  Output 1450 ml  Net -1015 ml    LBM: Last BM Date : 01/02/22 Baseline Weight: Weight: 57 kg Most recent weight: Weight: 57 kg     Palliative Assessment/Data: PPS 40%     *Please note that this is a verbal dictation therefore any spelling or grammatical errors are due to the "[place] [employer]" system interpretation.  Juel Burrow, DNP, AGNP-C Palliative Medicine Team [phone] Pager: [phone]

## 2022-01-09 DIAGNOSIS — R109 Unspecified abdominal pain: Secondary | ICD-10-CM | POA: Diagnosis not present

## 2022-01-09 DIAGNOSIS — Z515 Encounter for palliative care: Secondary | ICD-10-CM | POA: Diagnosis not present

## 2022-01-09 DIAGNOSIS — Z7189 Other specified counseling: Secondary | ICD-10-CM | POA: Diagnosis not present

## 2022-01-09 DIAGNOSIS — C719 Malignant neoplasm of brain, unspecified: Secondary | ICD-10-CM | POA: Diagnosis not present

## 2022-01-09 LAB — COMPREHENSIVE METABOLIC PANEL
ALT: 56 U/L — ABNORMAL HIGH (ref 0–44)
AST: 16 U/L (ref 15–41)
Albumin: 2.6 g/dL — ABNORMAL LOW (ref 3.5–5.0)
Alkaline Phosphatase: 44 U/L (ref 38–126)
Anion gap: 6 (ref 5–15)
BUN: 19 mg/dL (ref 6–20)
CO2: 23 mmol/L (ref 22–32)
Calcium: 8.3 mg/dL — ABNORMAL LOW (ref 8.9–10.3)
Chloride: 104 mmol/L (ref 98–111)
Creatinine, Ser: 0.33 mg/dL — ABNORMAL LOW (ref 0.44–1.00)
GFR, Estimated: >60 mL/min (ref >60)
Glucose, Bld: 128 mg/dL — ABNORMAL HIGH (ref 70–99)
Potassium: 3.8 mmol/L (ref 3.5–5.1)
Sodium: 133 mmol/L — ABNORMAL LOW (ref 135–145)
Total Bilirubin: 0.6 mg/dL (ref 0.3–1.2)
Total Protein: 5 g/dL — ABNORMAL LOW (ref 6.5–8.1)

## 2022-01-09 LAB — CBC WITH DIFFERENTIAL/PLATELET
Abs Immature Granulocytes: 0.49 10*3/uL — ABNORMAL HIGH (ref 0.00–0.07)
Basophils Absolute: 0 10*3/uL (ref 0.0–0.1)
Basophils Relative: 0 %
Eosinophils Absolute: 0 10*3/uL (ref 0.0–0.5)
Eosinophils Relative: 0 %
HCT: 27.1 % — ABNORMAL LOW (ref 36.0–46.0)
Hemoglobin: 9.7 g/dL — ABNORMAL LOW (ref 12.0–15.0)
Immature Granulocytes: 19 %
Lymphocytes Relative: 3 %
Lymphs Abs: 0.1 10*3/uL — ABNORMAL LOW (ref 0.7–4.0)
MCH: 33.7 pg (ref 26.0–34.0)
MCHC: 35.8 g/dL (ref 30.0–36.0)
MCV: 94.1 fL (ref 80.0–100.0)
Monocytes Absolute: 0.1 10*3/uL (ref 0.1–1.0)
Monocytes Relative: 3 %
Neutro Abs: 2 10*3/uL (ref 1.7–7.7)
Neutrophils Relative %: 75 %
Platelets: 72 10*3/uL — ABNORMAL LOW (ref 150–400)
RBC: 2.88 MIL/uL — ABNORMAL LOW (ref 3.87–5.11)
RDW: 16.9 % — ABNORMAL HIGH (ref 11.5–15.5)
WBC: 2.6 10*3/uL — ABNORMAL LOW (ref 4.0–10.5)
nRBC: 1.5 % — ABNORMAL HIGH (ref 0.0–0.2)

## 2022-01-09 LAB — URINE CULTURE: Culture: 60000 — AB

## 2022-01-09 LAB — MAGNESIUM: Magnesium: 2.1 mg/dL (ref 1.7–2.4)

## 2022-01-09 LAB — PHOSPHORUS: Phosphorus: 2.7 mg/dL (ref 2.5–4.6)

## 2022-01-09 NOTE — Progress Notes (Signed)
Daily Progress Note   Patient Name: Patricia Stevenson       Date: 01/09/2022 DOB: 04-02-1961  Age: 61 y.o. MRN#: [id number] Attending Physician: Elodia Florence., * Primary Care Physician: Isaac Bliss, Rayford Halsted, MD Admit Date: 01/06/2022  Reason for Consultation/Follow-up: Establishing goals of care  Subjective: Patient denies pain or discomfort, mostly sleeps  Length of Stay: 3  Current Medications: Scheduled Meds:   sodium chloride   Intravenous Once   dexamethasone  4 mg Oral BID WC   famotidine  20 mg Oral BID   levETIRAcetam  500 mg Oral BID    Continuous Infusions:  sodium chloride     ceFEPime (MAXIPIME) IV 2 g (01/09/22 0944)    PRN Meds: acetaminophen **OR** [DISCONTINUED] acetaminophen, lidocaine, ondansetron **OR** ondansetron (ZOFRAN) IV, oxyCODONE, oxyCODONE, polyethylene glycol  Physical Exam Constitutional:      General: She is not in acute distress.    Appearance: She is ill-appearing.     Comments: Slightly lethargic wakes easily to touch or voice  Pulmonary:     Effort: Pulmonary effort is normal.  Skin:    General: Skin is warm and dry.            Vital Signs: BP 116/77 (BP Location: Right Arm)   Pulse (!) 56   Temp 98 F (36.7 C) (Oral)   Resp 17   Wt 57 kg   SpO2 99%   BMI 24.54 kg/m  SpO2: SpO2: 99 % O2 Device: O2 Device: Room Air O2 Flow Rate: O2 Flow Rate (L/min): 0 L/min  Intake/output summary:  Intake/Output Summary (Last 24 hours) at 01/09/2022 1045 Last data filed at 01/09/2022 1010 Gross per 24 hour  Intake 234 ml  Output 600 ml  Net -366 ml   LBM: Last BM Date : 01/02/22 Baseline Weight: Weight: 57 kg Most recent weight: Weight: 57 kg  Palliative Assessment/Data: PPS 40%    Flowsheet Rows    Flowsheet Row Most Recent Value   Intake Tab   Referral Department Hospitalist  Unit at Time of Referral Med/Surg Unit  Palliative Care Primary Diagnosis Cancer  Date Notified 01/07/22  Palliative Care Type New Palliative care  Reason for referral Clarify Goals of Care  Date of Admission 01/06/22  Date first seen by Palliative Care 01/08/22  # of days Palliative referral response time 1 Day(s)  # of days IP prior to Palliative referral 1  Clinical Assessment   Psychosocial & Spiritual Assessment   Palliative Care Outcomes        Patient Active Problem List   Diagnosis Date Noted   Acute pyelonephritis 01/07/2022   Flank pain 60/63/0160   Acute metabolic encephalopathy 10/93/2355   Shortness of breath 01/06/2022   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   Lymphopenia 12/30/2021   SIRS  12/29/2021   Neutropenia ([place]) 12/29/2021   Pancytopenia ([place]) 12/29/2021   Bradycardia 12/29/2021   Hyponatremia 12/29/2021   Hypokalemia 12/29/2021   Physical deconditioning 12/29/2021   Left-sided weakness 10/27/2021   Paroxysmal atrial fibrillation ([place]) 10/27/2021   Hypertension 09/16/2021   Right temporal lobe mass 09/15/2021   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  Flank pain thought to be likely peri-nephric bleeding, hematoma.  Patient also with progressive failure to thrive.  PMT consulted to discuss goals of care.  Goals of care conversations were begun by Dr. Mickeal Skinner.   Assessment: Meeting today to include patient's spouse in person and daughter [name] via telephone. Spanish interpreter was offered but daughter and spouse declined the need.  We reviewed patient's status - Reviewed her decline at home.   Spouse reports patient is essentially bedbound.  He shares his difficulties caring for her at home.  He tells me appetite is better now but was declining at home.  We reviewed previous conversation with Dr. Mickeal Skinner.  Family understands they are not options for aggressive medical care moving forward.  We discussed the recommendation to transition to focus on comfort, quality of life, and involving hospice.  Reviewed philosophy of hospice services and type of services provided.  We also reviewed CODE STATUS and reviewed recommendation of DNR. Encouraged family to consider DNR/DNI status understanding evidenced based poor outcomes in similar hospitalized patients, as the cause of the arrest is likely associated with chronic/terminal disease rather than a reversible acute cardio-pulmonary event.  Family is receptive to all information and seems to understand poor prognosis.  They do share, as were shared with me yesterday by daughter, that they do not want to make any decisions until patient's other sister is at bedside which should be Tuesday.  They tell me they would like follow-up Tuesday for decision making.  If patient has already been discharged these conversations can be had with outpatient team.  Recommendations/Plan: Family educated on hospice services DNR recommended No symptom management needs identified at this time PMT will follow up with family Tuesday per family request, if she is discharged prior to Tuesday can follow-up with outpatient team  Care plan was discussed with RN, spouse, daughter  Thank you for allowing the Palliative Medicine Team to assist in the care of this patient.  *Please note that this is a verbal dictation therefore any spelling or grammatical errors are due to the "[employer] [employer]" system interpretation.  Juel Burrow, DNP, [facility] Palliative Medicine Team Team Phone # [phone]  Pager [phone]

## 2022-01-09 NOTE — Evaluation (Signed)
Occupational Therapy Evaluation Patient Details Name: Patricia Stevenson MRN: [id number] DOB: 03-22-1961 Today's Date: 01/09/2022   History of Present Illness Patricia Stevenson is a 61 y.o. 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 is a 61 year old woman who presents with above medical history. On evaluation she presents with generalized weakness, poor activity tolerance, impaired balance, left inattention, and left sided hemiparesis. She was max assist to transfer to side of bed and only able to tolerate approximately 90 seconds of sitting edge of bed before laying herself down despite encouragement to try to stay up and stand. Patient and spouse verbalize they want more therapy - though patient states she is very weak. Patient will benefit from skilled OT services while in hospital to improve deficits and learn compensatory strategies as needed in order to improve functional abilities to reduce caregiver burden. Overall rehab potential is guarded.        Recommendations for follow up therapy are one component of a multi-disciplinary discharge planning process, led by the attending physician.  Recommendations may be updated based on patient status, additional functional criteria and insurance authorization.   Follow Up Recommendations  Home health OT    Assistance Recommended at Discharge Frequent or constant Supervision/Assistance  Patient can return home with the following A lot of help with bathing/dressing/bathroom;Assistance with cooking/housework;Direct supervision/assist for financial management;Assist for transportation;Help with stairs or ramp for entrance;Direct supervision/assist for medications management;Two people to help with walking and/or transfers    Functional Status Assessment  Patient has had a recent decline in  their functional status and/or demonstrates limited ability to make significant improvements in function in a reasonable and predictable amount of time  Equipment Recommendations  None recommended by OT    Recommendations for Other Services       Precautions / Restrictions Precautions Precautions: Fall Precaution Comments: incontinent, L side weakness/inattention Restrictions Weight Bearing Restrictions: No      Mobility Bed Mobility Overal bed mobility: Needs Assistance Bed Mobility: Supine to Sit, Sit to Supine     Supine to sit: Max assist, +2 for safety/equipment, HOB elevated Sit to supine: Mod assist, +2 for physical assistance        Transfers                   General transfer comment: unable to attempt to sit to stand      Balance Overall balance assessment: Needs assistance Sitting-balance support: Feet supported, Single extremity supported Sitting balance-Leahy Scale: Poor Sitting balance - Comments: propping with at least one upper extremity                                   ADL either performed or assessed with clinical judgement   ADL Overall ADL's : Needs assistance/impaired Eating/Feeding: Moderate assistance;Bed level   Grooming: Wash/dry face;Bed level;Minimal assistance;Cueing for sequencing   Upper Body Bathing: Bed level;Moderate assistance;Cueing for sequencing   Lower Body Bathing: Bed level;Total assistance   Upper Body Dressing : Bed level;Maximal assistance   Lower Body Dressing: Maximal assistance;Bed level     Toilet Transfer Details (indicate cue type and reason): unable to tolerate transfer Toileting- Clothing Manipulation and Hygiene: Total assistance;Bed level       Functional mobility during ADLs: Maximal assistance;+2 for  safety/equipment General ADL Comments: Max assist to transfer to edge of bed. Patient tolerated approx 90 seconds at edge of bed prior to reporting too tired and laying herself  down.     Vision   Vision Assessment?: No apparent visual deficits     Perception     Praxis      Pertinent Vitals/Pain Pain Assessment Pain Assessment: No/denies pain     Hand Dominance Right   Extremity/Trunk Assessment Upper Extremity Assessment Upper Extremity Assessment: RUE deficits/detail;LUE deficits/detail RUE Deficits / Details: WFL ROM, grossly 4/5 strength LUE Deficits / Details: WFL ROM, 3+/5 grossly throughout   Lower Extremity Assessment Lower Extremity Assessment: Defer to PT evaluation   Cervical / Trunk Assessment Cervical / Trunk Assessment: Normal   Communication Communication Communication: Prefers language other than English (spanish)   Cognition Arousal/Alertness: Awake/alert Behavior During Therapy: Flat affect Overall Cognitive Status: Difficult to assess Area of Impairment: Orientation                 Orientation Level: Person, Place, Time     Following Commands: Follows one step commands consistently     Problem Solving: Slow processing, Requires verbal cues General Comments: Able to follow commands, minimal verbalizations, continues to have flat affect, some left sided inattention, alert to self, place, month, year. Husband translating but she can speak some english.     General Comments       Exercises     Shoulder Instructions      Home Living Family/patient expects to be discharged to:: Private residence Living Arrangements: Spouse/significant other;Children;Other relatives Available Help at Discharge: Family;Available 24 hours/day Type of Home: Apartment Home Access: Stairs to enter Entrance Stairs-Number of Steps: 1 flight Entrance Stairs-Rails: Left Home Layout: One level     Bathroom Shower/Tub: Teacher, early years/pre: Standard Bathroom Accessibility: No   Home Equipment: Conservation officer, nature (2 wheels);BSC/3in1;Hospital bed          Prior Functioning/Environment Prior Level of Function : Needs  assist             Mobility Comments: was ambulatory with RW and able to negotiate stairs with assistance up until about a week prior to this admission; has been mostly bed bound since hospital discharge. ADLs Comments: some assistance with bathing/dressing up until about a week prior to this admissoin; near total assist since discharge        OT Problem List: Decreased activity tolerance;Impaired balance (sitting and/or standing);Decreased safety awareness;Decreased strength;Decreased knowledge of precautions;Decreased knowledge of use of DME or AE;Pain;Impaired UE functional use      OT Treatment/Interventions: Self-care/ADL training;Therapeutic exercise;Neuromuscular education;Energy conservation;DME and/or AE instruction;Therapeutic activities;Balance training;Patient/family education    OT Goals(Current goals can be found in the care plan section) Acute Rehab OT Goals Patient Stated Goal: to stand OT Goal Formulation: With patient/family Time For Goal Achievement: 01/23/22 Potential to Achieve Goals: Fair  OT Frequency:      Co-evaluation PT/OT/SLP Co-Evaluation/Treatment: Yes (coeval)            AM-PAC OT "6 Clicks" Daily Activity     Outcome Measure Help from another person eating meals?: A Lot Help from another person taking care of personal grooming?: A Lot Help from another person toileting, which includes using toliet, bedpan, or urinal?: Total Help from another person bathing (including washing, rinsing, drying)?: A Lot Help from another person to put on and taking off regular upper body clothing?: A Lot Help from another person to put on and taking  off regular lower body clothing?: A Lot 6 Click Score: 11   End of Session Nurse Communication: Mobility status  Activity Tolerance: Patient limited by fatigue Patient left: in bed;with call bell/phone within reach;with family/visitor present;with bed alarm set  OT Visit Diagnosis: Muscle weakness (generalized)  (M62.81)                Time: 7471-5953 OT Time Calculation (min): 18 min Charges:  OT General Charges $OT Visit: 1 Visit OT Evaluation $OT Eval Low Complexity: 1 Low  Kartik Fernando, OTR/L [place]  Office [phone] Pager: [place] 01/09/2022, 2:39 PM

## 2022-01-09 NOTE — Progress Notes (Signed)
PROGRESS NOTE    Patricia Stevenson  XQJ:[id number] DOB: 1960/08/25 DOA: 01/06/2022 PCP: Isaac Bliss, Rayford Halsted, MD  Chief Complaint  Patient presents with   Abdominal Pain    Brief Narrative:  Patricia Stevenson is Patricia Stevenson 61 y.o. 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   She's had 2 days of R sided flank pain, woke up last night screaming in pain.  Lasted minutes, then recurred.  Recurred Sammuel Blick 3rd time around 10 am this morning.  Denies trauma, fevers, change in urinary habits.  She's been unable to walk for about 2 weeks.  She's been more confused for 3-4 weeks.  She was recently discharged after an admission for sepsis/neutropenic fever without clear infectious source identified.  She was treated with broad spectrum abx which were ultimately discontinued.  She was given granix x1 on 5/10.  She was also transfused with platelets for thrombocytopenia.   She's here today again due to pain.  Of note, Dr. Mickeal Skinner had discussion with the patient/family regarding her recent decline and they'd began to discuss goals of care.    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)   Pancytopenia (HCC)   Acute metabolic encephalopathy   Shortness of breath   Acute pyelonephritis   SIRS    Paroxysmal atrial fibrillation (HCC)   Flat affect   Physical deconditioning   Hypokalemia   Hyponatremia   Bradycardia   Hypertension   Assessment and Plan: * Flank pain Related to abnormal CT scan  New abnormal perirenal stranding on R ddx includes thrombosis of renal venous tributaries vs recent forniceal rupture vs hematoma along right kidney lower pole pelvis with effacement of venous structures Renal artery [place] with patent IVC and renal veins bilaterally, right renal vein patent prox mid, distal and at  hiluim.  Left renal vein patent distal and at hilum, limited visualization at prox and mid segments. UA with RBC's, negative leukocyte and nitrite -> 60,000 e. 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 Mickeal Skinner planning on discussion regarding goals of care, he'd broached this discussion 5/16 with family.   Appreciate Dr. Renda Rolls assistance -> open to visiting with palliative care, will consult at this time.   Acute pyelonephritis abx as above  Shortness of breath CXR without acute abnormalities Currently on RA, hold additional w/u right now given stability and plan for goc discussions with neuro oncology Continue to follow - no additional complaints today  Acute metabolic encephalopathy Delirium precautions MRI 5/7 with mixed imaging response to therapy since march -> regressed enhancement in R midbrain, but new petichial enhancement in R optic radiations, new discontinuous T2/Flair hyperintensity in both medial R thalamus and R corona radiata Head  CT with punctate focus of increased density in R pons (may represent small punctate focus of hemorrhage within previously noted tumor) - neuro oncology aware Overall mental status seems improved with abx  Paroxysmal atrial  fibrillation (HCC) Sinus brady No anticoagulation with thrombocytopenia  Flat affect Related to encephalopathy vs mood/depression Will monitor  Physical deconditioning Consider therapy eval based on discussion with neuro oncology  Hypokalemia Replace and follow  Hyponatremia mild  Bradycardia noted      DVT prophylaxis: SCD Code Status: full Family Communication: none Disposition:   Status is: Inpatient Remains inpatient appropriate because: need for IV abx, GOC. Oncology, neuro oncology, palliative care recs   Consultants:  Neuro oncology oncology  Procedures:  Summary:  Renal:     Bilateral: IVC and renal veins are patent bilaterally. Right renal             vein patent prox, mid, distal, and at hilum, Left renal             vein patent distal and at hilum. Limited visualization at             prox and mid segments.   Antimicrobials:  Anti-infectives (From admission, onward)    Start     Dose/Rate Route Frequency Ordered Stop   01/07/22 2300  ceFEPIme (MAXIPIME) 2 g in sodium chloride 0.9 % 100 mL IVPB        2 g 200 mL/hr over 30 Minutes Intravenous Every 12 hours 01/07/22 1158     01/07/22 1245  ceFEPIme (MAXIPIME) 2 g in sodium chloride 0.9 % 100 mL IVPB        2 g 200 mL/hr over 30 Minutes Intravenous NOW 01/07/22 1157 01/07/22 1308       Subjective: Interpreter used Husband and sister in law at bedside Feeling better   Objective: Vitals:   01/08/22 1257 01/08/22 2134 01/09/22 0642 01/09/22 1435  BP: 119/77 (!) 95/57 116/77 106/75  Pulse: 71 72 (!) 56 71  Resp: '16 17 17 16  '$ Temp: 98.3 F (36.8 C) 98.5 F (36.9 C) 98 F (36.7 C) 98.7 F (37.1 C)  TempSrc: Oral Oral Oral Oral  SpO2: 98% 95% 99% 96%  Weight:        Intake/Output Summary (Last 24 hours) at 01/09/2022 1824 Last data filed at 01/09/2022 1300 Gross per 24 hour  Intake 474 ml  Output 950 ml  Net -476 ml   Filed Weights   01/08/22 0428  Weight: 57 kg     Examination:  General: No acute distress. Cardiovascular: RRR Lungs: unlabored Abdomen: Soft, nontender, nondistended Neurological: Alert and oriented 3. Moves all extremities 4. Cranial nerves II through XII grossly intact. Extremities: No clubbing or cyanosis. No edema  Data Reviewed: I have personally reviewed following labs and imaging studies  CBC: Recent Labs  Lab 01/06/22 1148 01/07/22 0558 01/08/22 0531 01/09/22 0606  WBC 1.6* 0.7* 2.4* 2.6*  NEUTROABS 1.4*  --  2.2 2.0  HGB 12.8 9.4* 10.4* 9.7*  HCT 35.9* 26.9* 30.1* 27.1*  MCV 93.5 94.1 95.3 94.1  PLT 23* 122* 99* 72*    Basic Metabolic Panel: Recent Labs  Lab 01/06/22 1148 01/07/22 0558 01/08/22 0531 01/09/22 0606  NA 132* 135 135 133*  K 4.4 2.9* 4.1 3.8  CL 98 102 106 104  CO2 25 25 21* 23  GLUCOSE 108* 80 120* 128*  BUN 24* '19 19 19  '$ CREATININE 0.39* 0.33* 0.33* 0.33*  CALCIUM 8.7* 8.1* 8.4* 8.3*  MG  --  2.0  --  2.1  PHOS  --   --   --  2.7    GFR: Estimated Creatinine Clearance: 60.6 mL/min (Abie Killian) (by C-G formula based on SCr of 0.33 mg/dL (L)).  Liver Function Tests: Recent Labs  Lab 01/06/22 1148 01/07/22 0558 01/09/22 0606  AST '31 22 16  '$ ALT 69* 55* 56*  ALKPHOS 57 42 44  BILITOT 0.6 0.8  0.9 0.6  PROT 6.1* 5.0* 5.0*  ALBUMIN 3.4* 2.7* 2.6*    CBG: No results for input(s): GLUCAP in the last 168 hours.   Recent Results (from the past 240 hour(s))  Urine Culture     Status: Abnormal   Collection Time: 01/06/22  3:51 PM   Specimen: Urine, Clean Catch  Result Value Ref Range Status   Specimen Description   Final    URINE, CLEAN CATCH Performed at [facility], [place] [address], [place], [place] [address]    Special Requests   Final    NONE Performed at [facility], [place] [address] [place], [place] [address]    Culture (Dantrell Schertzer)  Final    60,000 COLONIES/mL ESCHERICHIA COLI 10,000 COLONIES/mL ENTEROBACTER AEROGENES    Report Status  01/09/2022 FINAL  Final   Organism ID, Bacteria ESCHERICHIA COLI (Jaedin Trumbo)  Final   Organism ID, Bacteria ENTEROBACTER AEROGENES (Haze Antillon)  Final      Susceptibility   Enterobacter aerogenes - MIC*    CEFAZOLIN >=64 RESISTANT Resistant     CEFEPIME <=0.12 SENSITIVE Sensitive     CEFTRIAXONE <=0.25 SENSITIVE Sensitive     CIPROFLOXACIN <=0.25 SENSITIVE Sensitive     GENTAMICIN <=1 SENSITIVE Sensitive     IMIPENEM 0.5 SENSITIVE Sensitive     NITROFURANTOIN 64 INTERMEDIATE Intermediate     TRIMETH/SULFA <=20 SENSITIVE Sensitive     PIP/TAZO <=4 SENSITIVE Sensitive     * 10,000 COLONIES/mL ENTEROBACTER AEROGENES   Escherichia coli - MIC*    AMPICILLIN >=32 RESISTANT Resistant     CEFAZOLIN >=64 RESISTANT Resistant     CEFEPIME 2 SENSITIVE Sensitive     CEFTRIAXONE >=64 RESISTANT Resistant     CIPROFLOXACIN 1 RESISTANT Resistant     GENTAMICIN <=1 SENSITIVE Sensitive     IMIPENEM <=0.25 SENSITIVE Sensitive     NITROFURANTOIN <=16 SENSITIVE Sensitive     TRIMETH/SULFA <=20 SENSITIVE Sensitive     AMPICILLIN/SULBACTAM >=32 RESISTANT Resistant     PIP/TAZO 64 INTERMEDIATE Intermediate     * 60,000 COLONIES/mL ESCHERICHIA COLI         Radiology Studies: No results found.      Scheduled Meds:  sodium chloride   Intravenous Once   dexamethasone  4 mg Oral BID WC   famotidine  20 mg Oral BID   levETIRAcetam  500 mg Oral BID   Continuous Infusions:  sodium chloride     ceFEPime (MAXIPIME) IV 2 g (01/09/22 0944)     LOS: 3 days    Time spent: over 30 min    Fayrene Helper, MD [facility]   To contact the attending provider between 7A-7P or the covering provider during after hours 7P-7A, please log into the web site [email or web address] and access using universal [place] password for that web site. If you do not have the password, please call the hospital operator.  01/09/2022, 6:24 PM

## 2022-01-09 NOTE — Evaluation (Signed)
Physical Therapy Evaluation Patient Details Name: Patricia Stevenson MRN: [id number] DOB: 10-Oct-1960 Today's Date: 01/09/2022  History of Present Illness  Patricia Stevenson is a 61 y.o. 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 CT showed New abnormal perirenal stranding on R  ddx includes thrombosis of renal venous tributaries vs recent forniceal rupture vs hematoma along right kidney lower pole pelvis with effacement of venous structures  Clinical Impression  Pt admitted as above and with generalized weakness, poor activity tolerance, impaired balance, left inattention, and left sided hemiparesis. She was max assist to transfer to side of bed and only able to tolerate approximately 90 seconds of sitting edge of bed before laying herself down despite encouragement to try to stay up and stand. Patient and spouse verbalize they want more therapy - though patient states she is very weak. Patient will benefit from skilled PT services while in hospital to maximize IND and safety to decrease caregiver burden upon return home. Overall rehab potential is guarded.   Recommendations for follow up therapy are one component of a multi-disciplinary discharge planning process, led by the attending physician.  Recommendations may be updated based on patient status, additional functional criteria and insurance authorization.  Follow Up Recommendations Home health PT    Assistance Recommended at Discharge Frequent or constant Supervision/Assistance  Patient can return home with the following  Two people to help with walking and/or transfers;Two people to help with bathing/dressing/bathroom;Assist for transportation;Help with stairs or ramp for entrance;Assistance with cooking/housework    Equipment Recommendations None recommended by PT  Recommendations for Other Services       Functional  Status Assessment Patient has had a recent decline in their functional status and demonstrates the ability to make significant improvements in function in a reasonable and predictable amount of time.     Precautions / Restrictions Precautions Precautions: Fall Precaution Comments: incontinent, L side weakness/inattention Restrictions Weight Bearing Restrictions: No      Mobility  Bed Mobility Overal bed mobility: Needs Assistance Bed Mobility: Supine to Sit, Sit to Supine Rolling: Mod assist   Supine to sit: Max assist, +2 for safety/equipment, HOB elevated Sit to supine: Mod assist, +2 for physical assistance   General bed mobility comments: Assist for trunk and bil LEs. Utilized bedpad for scooting, positioning. Increased time. Multimodal cueing provided. Pt fatigues very quickly.    Transfers                   General transfer comment: unable to attempt to sit to stand; pt to sitting only, tolerated sitting less than two minutes and flopping back on bed too fatigued to continue    Ambulation/Gait                  Stairs            Wheelchair Mobility    Modified Rankin (Stroke Patients Only)       Balance Overall balance assessment: Needs assistance Sitting-balance support: Feet supported, Single extremity supported Sitting balance-Leahy Scale: Poor Sitting balance - Comments: propping with at least one upper extremity                                     Pertinent Vitals/Pain Pain Assessment Pain Assessment: No/denies pain    Home Living Family/patient expects to be discharged  to:: Private residence Living Arrangements: Spouse/significant other;Children;Other relatives Available Help at Discharge: Family;Available 24 hours/day Type of Home: Apartment Home Access: Stairs to enter Entrance Stairs-Rails: Left Entrance Stairs-Number of Steps: 1 flight   Home Layout: One level Home Equipment: Conservation officer, nature (2  wheels);BSC/3in1;Hospital bed      Prior Function Prior Level of Function : Needs assist             Mobility Comments: Pt has been largely bedbound since recent dc from hospital but prior to last admit was ambulatory with RW ADLs Comments: some assistance with bathing/dressing up until about a week prior to this admissoin; near total assist since discharge     Hand Dominance   Dominant Hand: Right    Extremity/Trunk Assessment   Upper Extremity Assessment Upper Extremity Assessment: RUE deficits/detail;Defer to OT evaluation RUE Deficits / Details: WFL ROM, grossly 4/5 strength LUE Deficits / Details: WFL ROM, 3+/5 grossly throughout    Lower Extremity Assessment Lower Extremity Assessment: Generalized weakness;LLE deficits/detail LLE Deficits / Details: Pt inconsistent with testing but AAROM WFL with 2/5 at ankle, trace at knee and 2-/5 at hip    Cervical / Trunk Assessment Cervical / Trunk Assessment: Normal  Communication   Communication: Prefers language other than English  Cognition Arousal/Alertness: Awake/alert Behavior During Therapy: Flat affect Overall Cognitive Status: Difficult to assess Area of Impairment: Orientation                 Orientation Level: Person, Place, Time     Following Commands: Follows one step commands consistently     Problem Solving: Slow processing, Requires verbal cues General Comments: Able to follow commands, minimal verbalizations, continues to have flat affect, some left sided inattention, alert to self, place, month, year. Husband translating but she can speak some english.        General Comments      Exercises     Assessment/Plan    PT Assessment Patient needs continued PT services  PT Problem List Decreased strength;Decreased mobility;Decreased activity tolerance;Decreased balance;Decreased knowledge of use of DME;Decreased cognition;Decreased coordination       PT Treatment Interventions DME  instruction;Gait training;Therapeutic activities;Therapeutic exercise;Patient/family education;Balance training;Functional mobility training    PT Goals (Current goals can be found in the Care Plan section)  Acute Rehab PT Goals Patient Stated Goal: to stand and walk again PT Goal Formulation: With patient/family Time For Goal Achievement: 01/25/22 Potential to Achieve Goals: Fair    Frequency Min 3X/week     Co-evaluation PT/OT/SLP Co-Evaluation/Treatment: Yes Reason for Co-Treatment: For patient/therapist safety PT goals addressed during session: Mobility/safety with mobility OT goals addressed during session: ADL's and self-care       AM-PAC PT "6 Clicks" Mobility  Outcome Measure Help needed turning from your back to your side while in a flat bed without using bedrails?: A Lot Help needed moving from lying on your back to sitting on the side of a flat bed without using bedrails?: A Lot Help needed moving to and from a bed to a chair (including a wheelchair)?: Total Help needed standing up from a chair using your arms (e.g., wheelchair or bedside chair)?: Total Help needed to walk in hospital room?: Total Help needed climbing 3-5 steps with a railing? : Total 6 Click Score: 8    End of Session   Activity Tolerance: Patient limited by fatigue Patient left: in bed;with call bell/phone within reach;with family/visitor present;with bed alarm set Nurse Communication: Mobility status PT Visit Diagnosis: History of falling (  Z91.81);Muscle weakness (generalized) (M62.81);Difficulty in walking, not elsewhere classified (R26.2);Pain;Adult, failure to thrive (R62.7);Other abnormalities of gait and mobility (R26.89)    Time: 0698-6148 PT Time Calculation (min) (ACUTE ONLY): 18 min   Charges:   PT Evaluation $PT Eval Low Complexity: 1 Low          [place] Pager [phone] Office [phone]   Anavey Coombes 01/09/2022, 4:56  PM

## 2022-01-10 DIAGNOSIS — R109 Unspecified abdominal pain: Secondary | ICD-10-CM | POA: Diagnosis not present

## 2022-01-10 LAB — CBC WITH DIFFERENTIAL/PLATELET
Abs Immature Granulocytes: 0.44 10*3/uL — ABNORMAL HIGH (ref 0.00–0.07)
Basophils Absolute: 0 10*3/uL (ref 0.0–0.1)
Basophils Relative: 0 %
Eosinophils Absolute: 0 10*3/uL (ref 0.0–0.5)
Eosinophils Relative: 0 %
HCT: 26.8 % — ABNORMAL LOW (ref 36.0–46.0)
Hemoglobin: 9.5 g/dL — ABNORMAL LOW (ref 12.0–15.0)
Immature Granulocytes: 19 %
Lymphocytes Relative: 5 %
Lymphs Abs: 0.1 10*3/uL — ABNORMAL LOW (ref 0.7–4.0)
MCH: 33.1 pg (ref 26.0–34.0)
MCHC: 35.4 g/dL (ref 30.0–36.0)
MCV: 93.4 fL (ref 80.0–100.0)
Monocytes Absolute: 0.1 10*3/uL (ref 0.1–1.0)
Monocytes Relative: 5 %
Neutro Abs: 1.6 10*3/uL — ABNORMAL LOW (ref 1.7–7.7)
Neutrophils Relative %: 71 %
Platelets: 54 10*3/uL — ABNORMAL LOW (ref 150–400)
RBC: 2.87 MIL/uL — ABNORMAL LOW (ref 3.87–5.11)
RDW: 16.9 % — ABNORMAL HIGH (ref 11.5–15.5)
WBC: 2.3 10*3/uL — ABNORMAL LOW (ref 4.0–10.5)
nRBC: 1.3 % — ABNORMAL HIGH (ref 0.0–0.2)

## 2022-01-10 LAB — COMPREHENSIVE METABOLIC PANEL
ALT: 58 U/L — ABNORMAL HIGH (ref 0–44)
AST: 18 U/L (ref 15–41)
Albumin: 2.7 g/dL — ABNORMAL LOW (ref 3.5–5.0)
Alkaline Phosphatase: 48 U/L (ref 38–126)
Anion gap: 7 (ref 5–15)
BUN: 22 mg/dL — ABNORMAL HIGH (ref 6–20)
CO2: 24 mmol/L (ref 22–32)
Calcium: 8.5 mg/dL — ABNORMAL LOW (ref 8.9–10.3)
Chloride: 104 mmol/L (ref 98–111)
Creatinine, Ser: 0.31 mg/dL — ABNORMAL LOW (ref 0.44–1.00)
GFR, Estimated: >60 mL/min (ref >60)
Glucose, Bld: 143 mg/dL — ABNORMAL HIGH (ref 70–99)
Potassium: 3.8 mmol/L (ref 3.5–5.1)
Sodium: 135 mmol/L (ref 135–145)
Total Bilirubin: 0.4 mg/dL (ref 0.3–1.2)
Total Protein: 5 g/dL — ABNORMAL LOW (ref 6.5–8.1)

## 2022-01-10 LAB — MAGNESIUM: Magnesium: 2.2 mg/dL (ref 1.7–2.4)

## 2022-01-10 LAB — PHOSPHORUS: Phosphorus: 3 mg/dL (ref 2.5–4.6)

## 2022-01-10 NOTE — Progress Notes (Signed)
Pharmacy Antibiotic Note  Patricia Stevenson is a 61 y.o. female admitted on 01/06/2022 with flank pain and pancytopenia. PMH significant for high grade glioma.  Pharmacy has been consulted for cefepime dosing for UTI.  Plan: Continue Cefepime 2 g IV q12h Follow culture data, renal function  Height: 5' (152.4 cm) (per 1 week ago documentation) Weight: 52.5 kg (115 lb 11.9 oz) IBW/kg (Calculated) : 45.5  Temp (24hrs), Avg:98.6 F (37 C), Min:98.4 F (36.9 C), Max:98.7 F (37.1 C)  Recent Labs  Lab 01/06/22 1148 01/07/22 0558 01/08/22 0531 01/09/22 0606 01/10/22 0522  WBC 1.6* 0.7* 2.4* 2.6* 2.3*  CREATININE 0.39* 0.33* 0.33* 0.33* 0.31*     Estimated Creatinine Clearance: 55.1 mL/min (A) (by C-G formula based on SCr of 0.31 mg/dL (L)).    Allergies  Allergen Reactions   Latex Rash    Antimicrobials this admission: cefepime 5/18 >>   Dose adjustments this admission:  Microbiology results: 5/17 UCx:  60k Ecoli (sens: Cefepime, gent, imi, NTF, bactrim), 60k Enterobacter aerogenes (sens: cefepime, ctx, cipro, gent, imi, bactrim, p/t)  Gretta Arab PharmD, BCPS Clinical Pharmacist [facility] main pharmacy [phone] 01/10/2022 8:09 AM

## 2022-01-10 NOTE — Progress Notes (Signed)
PROGRESS NOTE    Patricia Stevenson  ZOX:[id number] DOB: 1960-12-21 DOA: 01/06/2022 PCP: Isaac Bliss, Rayford Halsted, MD  Chief Complaint  Patient presents with   Abdominal Pain    Brief Narrative:  Patricia Stevenson is Patricia Stevenson 61 y.o. 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   She's had 2 days of R sided flank pain, woke up last night screaming in pain.  Lasted minutes, then recurred.  Recurred [place] 3rd time around 10 am this morning.  Denies trauma, fevers, change in urinary habits.  She's been unable to walk for about 2 weeks.  She's been more confused for 3-4 weeks.  She was recently discharged after an admission for sepsis/neutropenic fever without clear infectious source identified.  She was treated with broad spectrum abx which were ultimately discontinued.  She was given granix x1 on 5/10.  She was also transfused with platelets for thrombocytopenia.   She's here today again due to pain.  Of note, Dr. Mickeal Skinner had discussion with the patient/family regarding her recent decline and they'd began to discuss goals of care.    Assessment & Plan:   Principal Problem:   Flank pain Active Problems:   Acute pyelonephritis   High grade glioma not classifiable by WHO criteria (HCC)   Pancytopenia (HCC)   Acute metabolic encephalopathy   Shortness of breath   SIRS    Paroxysmal atrial fibrillation (HCC)   Flat affect   Physical deconditioning   Hypokalemia   Hyponatremia   Bradycardia   Hypertension   Assessment and Plan: * Flank pain Related to abnormal CT scan  New abnormal perirenal stranding on R ddx includes thrombosis of renal venous tributaries vs recent forniceal rupture vs hematoma along right kidney lower pole pelvis with effacement of venous structures Renal artery [place] with patent IVC and renal veins bilaterally, right renal vein patent prox mid, distal and at  hiluim.  Left renal vein patent distal and at hilum, limited visualization at prox and mid segments. UA with RBC's, negative leukocyte and nitrite -> 60,000 e. Coli, 10,000 enterobacter aerogenes - both sensitive to cefepime - will discuss with ID Cefepime 5/18 - S/p 2 units platelets 5/17 given possible hematoma as cause of sx and thrombocytopenia, platelets improved Discussed with urology, not need for any acute intervention from their standpoint Follow symptomatically, consider repeat imaging Pain management  Acute pyelonephritis abx as above  Pancytopenia ([place]) Given abnormal renal imaging, concern for possible hematoma S/p platelets x2 units 5/17 ANC 2.0 today, hold further granix Platelets drifting down again, will follow closely She has scattered petechiae and purpura    High grade glioma not classifiable by WHO criteria [facility]) S/p surgical resection and chemoradiation with temodar Currently temodar on hold per recent d/c summary Continue keppra and decadron Dr. Mickeal Skinner planning on discussion regarding goals of care, he'd broached this discussion 5/16 with family.   Appreciate Dr. Renda Rolls assistance -> open to visiting with palliative care, will consult at this time.   Shortness of breath CXR without acute abnormalities Currently on RA, hold additional w/u right now given stability and plan for goc discussions with neuro oncology Continue to follow - no additional complaints today  Acute metabolic encephalopathy Delirium precautions MRI 5/7 with mixed imaging response to therapy since march -> regressed enhancement in R midbrain, but new petichial enhancement in R optic radiations, new discontinuous T2/Flair hyperintensity in both medial R thalamus  and R corona radiata Head CT with punctate focus of increased density in R pons (may represent small punctate focus of hemorrhage within previously noted tumor) - neuro oncology aware Overall mental status seems improved with  abx  Paroxysmal atrial fibrillation (HCC) Sinus brady No anticoagulation with thrombocytopenia  Flat affect Related to encephalopathy vs mood/depression Will monitor  Physical deconditioning Consider therapy eval based on discussion with neuro oncology  Hypokalemia Replace and follow  Hyponatremia mild  Bradycardia noted      DVT prophylaxis: SCD Code Status: full Family Communication: none Disposition:   Status is: Inpatient Remains inpatient appropriate because: need for IV abx, GOC. Oncology, neuro oncology, palliative care recs   Consultants:  Neuro oncology oncology  Procedures:  Summary:  Renal:     Bilateral: IVC and renal veins are patent bilaterally. Right renal             vein patent prox, mid, distal, and at hilum, Left renal             vein patent distal and at hilum. Limited visualization at             prox and mid segments.   Antimicrobials:  Anti-infectives (From admission, onward)    Start     Dose/Rate Route Frequency Ordered Stop   01/07/22 2300  ceFEPIme (MAXIPIME) 2 g in sodium chloride 0.9 % 100 mL IVPB        2 g 200 mL/hr over 30 Minutes Intravenous Every 12 hours 01/07/22 1158     01/07/22 1245  ceFEPIme (MAXIPIME) 2 g in sodium chloride 0.9 % 100 mL IVPB        2 g 200 mL/hr over 30 Minutes Intravenous NOW 01/07/22 1157 01/07/22 1308       Subjective: Husband at bedside assists with translation No new complaints  Objective: Vitals:   01/09/22 2015 01/09/22 2138 01/10/22 0435 01/10/22 0500  BP: 100/65  108/73   Pulse: 74  63   Resp: 18  16   Temp: 98.6 F (37 C)  98.4 F (36.9 C)   TempSrc: Oral  Oral   SpO2: 94%  98%   Weight:    52.5 kg  Height:  5' (1.524 m)      Intake/Output Summary (Last 24 hours) at 01/10/2022 1335 Last data filed at 01/10/2022 0815 Gross per 24 hour  Intake 692.85 ml  Output 1300 ml  Net -607.15 ml   Filed Weights   01/08/22 0428 01/10/22 0500  Weight: 57 kg 52.5 kg     Examination:  General: No acute distress. Cardiovascular: RRR Lungs: unlabored Abdomen: Soft, nontender, nondistended  Neurological: Alert and oriented 3. Moves all extremities 4. Cranial nerves II through XII grossly intact. Extremities: No clubbing or cyanosis. No edema.  Data Reviewed: I have personally reviewed following labs and imaging studies  CBC: Recent Labs  Lab 01/06/22 1148 01/07/22 0558 01/08/22 0531 01/09/22 0606 01/10/22 0522  WBC 1.6* 0.7* 2.4* 2.6* 2.3*  NEUTROABS 1.4*  --  2.2 2.0 1.6*  HGB 12.8 9.4* 10.4* 9.7* 9.5*  HCT 35.9* 26.9* 30.1* 27.1* 26.8*  MCV 93.5 94.1 95.3 94.1 93.4  PLT 23* 122* 99* 72* 54*    Basic Metabolic Panel: Recent Labs  Lab 01/06/22 1148 01/07/22 0558 01/08/22 0531 01/09/22 0606 01/10/22 0522  NA 132* 135 135 133* 135  K 4.4 2.9* 4.1 3.8 3.8  CL 98 102 106 104 104  CO2 25 25 21* 23 24  GLUCOSE 108*  80 120* 128* 143*  BUN 24* '19 19 19 '$ 22*  CREATININE 0.39* 0.33* 0.33* 0.33* 0.31*  CALCIUM 8.7* 8.1* 8.4* 8.3* 8.5*  MG  --  2.0  --  2.1 2.2  PHOS  --   --   --  2.7 3.0    GFR: Estimated Creatinine Clearance: 55.1 mL/min (Landra Howze) (by C-G formula based on SCr of 0.31 mg/dL (L)).  Liver Function Tests: Recent Labs  Lab 01/06/22 1148 01/07/22 0558 01/09/22 0606 01/10/22 0522  AST '31 22 16 18  '$ ALT 69* 55* 56* 58*  ALKPHOS 57 42 44 48  BILITOT 0.6 0.8  0.9 0.6 0.4  PROT 6.1* 5.0* 5.0* 5.0*  ALBUMIN 3.4* 2.7* 2.6* 2.7*    CBG: No results for input(s): GLUCAP in the last 168 hours.   Recent Results (from the past 240 hour(s))  Urine Culture     Status: Abnormal   Collection Time: 01/06/22  3:51 PM   Specimen: Urine, Clean Catch  Result Value Ref Range Status   Specimen Description   Final    URINE, CLEAN CATCH Performed at [facility], [place] [address], [place], [place] [address]    Special Requests   Final    NONE Performed at [facility], [place] [address]  [place], [place] [address]    Culture (Ersa Delaney)  Final    60,000 COLONIES/mL ESCHERICHIA COLI 10,000 COLONIES/mL ENTEROBACTER AEROGENES    Report Status 01/09/2022 FINAL  Final   Organism ID, Bacteria ESCHERICHIA COLI (Vester Balthazor)  Final   Organism ID, Bacteria ENTEROBACTER AEROGENES (Narcissus Detwiler)  Final      Susceptibility   Enterobacter aerogenes - MIC*    CEFAZOLIN >=64 RESISTANT Resistant     CEFEPIME <=0.12 SENSITIVE Sensitive     CEFTRIAXONE <=0.25 SENSITIVE Sensitive     CIPROFLOXACIN <=0.25 SENSITIVE Sensitive     GENTAMICIN <=1 SENSITIVE Sensitive     IMIPENEM 0.5 SENSITIVE Sensitive     NITROFURANTOIN 64 INTERMEDIATE Intermediate     TRIMETH/SULFA <=20 SENSITIVE Sensitive     PIP/TAZO <=4 SENSITIVE Sensitive     * 10,000 COLONIES/mL ENTEROBACTER AEROGENES   Escherichia coli - MIC*    AMPICILLIN >=32 RESISTANT Resistant     CEFAZOLIN >=64 RESISTANT Resistant     CEFEPIME 2 SENSITIVE Sensitive     CEFTRIAXONE >=64 RESISTANT Resistant     CIPROFLOXACIN 1 RESISTANT Resistant     GENTAMICIN <=1 SENSITIVE Sensitive     IMIPENEM <=0.25 SENSITIVE Sensitive     NITROFURANTOIN <=16 SENSITIVE Sensitive     TRIMETH/SULFA <=20 SENSITIVE Sensitive     AMPICILLIN/SULBACTAM >=32 RESISTANT Resistant     PIP/TAZO 64 INTERMEDIATE Intermediate     * 60,000 COLONIES/mL ESCHERICHIA COLI         Radiology Studies: No results found.      Scheduled Meds:  sodium chloride   Intravenous Once   dexamethasone  4 mg Oral BID WC   famotidine  20 mg Oral BID   levETIRAcetam  500 mg Oral BID   Continuous Infusions:  sodium chloride     ceFEPime (MAXIPIME) IV 2 g (01/10/22 0859)     LOS: 4 days    Time spent: over 30 min    Fayrene Helper, MD [facility]   To contact the attending provider between 7A-7P or the covering provider during after hours 7P-7A, please log into the web site [email or web address] and access using universal [place] password for that web site. If you do not  have the password,  please call the hospital operator.  01/10/2022, 1:35 PM

## 2022-01-11 ENCOUNTER — Ambulatory Visit: Payer: 59 | Admitting: Physical Therapy

## 2022-01-11 ENCOUNTER — Telehealth: Payer: Self-pay | Admitting: *Deleted

## 2022-01-11 DIAGNOSIS — R109 Unspecified abdominal pain: Secondary | ICD-10-CM | POA: Diagnosis not present

## 2022-01-11 LAB — COMPREHENSIVE METABOLIC PANEL
ALT: 68 U/L — ABNORMAL HIGH (ref 0–44)
AST: 21 U/L (ref 15–41)
Albumin: 2.6 g/dL — ABNORMAL LOW (ref 3.5–5.0)
Alkaline Phosphatase: 47 U/L (ref 38–126)
Anion gap: 6 (ref 5–15)
BUN: 15 mg/dL (ref 6–20)
CO2: 23 mmol/L (ref 22–32)
Calcium: 8 mg/dL — ABNORMAL LOW (ref 8.9–10.3)
Chloride: 107 mmol/L (ref 98–111)
Creatinine, Ser: <0.3 mg/dL — ABNORMAL LOW (ref 0.44–1.00)
Glucose, Bld: 155 mg/dL — ABNORMAL HIGH (ref 70–99)
Potassium: 3.8 mmol/L (ref 3.5–5.1)
Sodium: 136 mmol/L (ref 135–145)
Total Bilirubin: 0.3 mg/dL (ref 0.3–1.2)
Total Protein: 4.9 g/dL — ABNORMAL LOW (ref 6.5–8.1)

## 2022-01-11 LAB — CBC WITH DIFFERENTIAL/PLATELET
Abs Immature Granulocytes: 0.04 10*3/uL (ref 0.00–0.07)
Abs Immature Granulocytes: 0.13 10*3/uL — ABNORMAL HIGH (ref 0.00–0.07)
Basophils Absolute: 0 10*3/uL (ref 0.0–0.1)
Basophils Absolute: 0 10*3/uL (ref 0.0–0.1)
Basophils Relative: 1 %
Basophils Relative: 1 %
Eosinophils Absolute: 0 10*3/uL (ref 0.0–0.5)
Eosinophils Absolute: 0 10*3/uL (ref 0.0–0.5)
Eosinophils Relative: 0 %
Eosinophils Relative: 0 %
HCT: 35.9 % — ABNORMAL LOW (ref 36.0–46.0)
HCT: 26.3 % — ABNORMAL LOW (ref 36.0–46.0)
Hemoglobin: 12.8 g/dL (ref 12.0–15.0)
Hemoglobin: 9.1 g/dL — ABNORMAL LOW (ref 12.0–15.0)
Immature Granulocytes: 3 %
Immature Granulocytes: 7 %
Lymphocytes Relative: 7 %
Lymphocytes Relative: 5 %
Lymphs Abs: 0.1 10*3/uL — ABNORMAL LOW (ref 0.7–4.0)
Lymphs Abs: 0.1 10*3/uL — ABNORMAL LOW (ref 0.7–4.0)
MCH: 33.3 pg (ref 26.0–34.0)
MCH: 32.7 pg (ref 26.0–34.0)
MCHC: 35.7 g/dL (ref 30.0–36.0)
MCHC: 34.6 g/dL (ref 30.0–36.0)
MCV: 93.5 fL (ref 80.0–100.0)
MCV: 94.6 fL (ref 80.0–100.0)
Monocytes Absolute: 0.1 10*3/uL (ref 0.1–1.0)
Monocytes Absolute: 0.1 10*3/uL (ref 0.1–1.0)
Monocytes Relative: 5 %
Monocytes Relative: 7 %
Neutro Abs: 1.4 10*3/uL — ABNORMAL LOW (ref 1.7–7.7)
Neutro Abs: 1.5 10*3/uL — ABNORMAL LOW (ref 1.7–7.7)
Neutrophils Relative %: 84 %
Neutrophils Relative %: 80 %
Platelets: 23 10*3/uL — CL (ref 150–400)
Platelets: 37 10*3/uL — ABNORMAL LOW (ref 150–400)
RBC: 3.84 MIL/uL — ABNORMAL LOW (ref 3.87–5.11)
RBC: 2.78 MIL/uL — ABNORMAL LOW (ref 3.87–5.11)
RDW: 16.6 % — ABNORMAL HIGH (ref 11.5–15.5)
RDW: 16.9 % — ABNORMAL HIGH (ref 11.5–15.5)
WBC: 1.6 10*3/uL — ABNORMAL LOW (ref 4.0–10.5)
WBC: 1.9 10*3/uL — ABNORMAL LOW (ref 4.0–10.5)
nRBC: 1.9 % — ABNORMAL HIGH (ref 0.0–0.2)
nRBC: 3.2 % — ABNORMAL HIGH (ref 0.0–0.2)

## 2022-01-11 LAB — PHOSPHORUS: Phosphorus: 2.3 mg/dL — ABNORMAL LOW (ref 2.5–4.6)

## 2022-01-11 LAB — MAGNESIUM: Magnesium: 2 mg/dL (ref 1.7–2.4)

## 2022-01-11 MED ORDER — SULFAMETHOXAZOLE-TRIMETHOPRIM 800-160 MG PO TABS
1.0000 | ORAL_TABLET | Freq: Two times a day (BID) | ORAL | Status: DC
  Administered 2022-01-11 – 2022-01-13 (×4): 1 via ORAL
  Filled 2022-01-11 (×4): qty 1

## 2022-01-11 MED ORDER — SULFAMETHOXAZOLE-TRIMETHOPRIM 800-160 MG PO TABS: 1.0000 | ORAL_TABLET | Freq: Two times a day (BID) | ORAL | 0 refills | Status: AC

## 2022-01-11 NOTE — Discharge Summary (Addendum)
Physician Discharge Summary  Patricia Stevenson IZT:[id number] DOB: 19-Jan-1961 DOA: 01/06/2022  PCP: Isaac Bliss, Rayford Halsted, MD  Admit date: 01/06/2022 Discharge date: 01/11/2022  Time spent: 40 minutes  Recommendations for Outpatient Follow-up:  Follow outpatient CBC/CMP  Follow with neuro oncology/oncology outpatient for pancytopenia, transfuse as indicated Follow with neuro oncology/palliative care to discuss goals of care   Reviewed d/c plan with husband, he's concerned regarding falling platelet counts.  Will monitor another 24 hrs.  Hold d/c for now.  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   She's had 2 days of R sided flank pain, woke up last night screaming in pain.  Lasted minutes, then recurred.  Recurred Patricia Stevenson 3rd time around 10 am this morning.  Denies trauma, fevers, change in urinary habits.  She's been unable to walk for about 2 weeks.  She's been more confused for 3-4 weeks.  She was recently discharged after an admission for sepsis/neutropenic fever without clear infectious source identified.  She was treated with broad spectrum abx which were ultimately discontinued.  She was given granix x1 on 5/10.  She was also transfused with platelets for thrombocytopenia.   She's here  today again due to pain.  Of note, Dr. Mickeal Skinner had discussion with the patient/family regarding her recent decline and they'd began to discuss goals of care.  She was admitted with flank pain, CT scan showed perirenal stranding on the right as well as other abnormal findings concerning for thrombosis of renal venous tributaries, recent forniceal rupture, or hematoma along the right kidney lower pole pelvis with effacement of the venous structures.  She was found to have Patricia Stevenson UTI and treated with cefepime.  She was discharged on bactrim.  He abdominal discomfort had improved by the time of discharge.  She was seen by Dr. Mickeal Skinner who discussed that she had limited options and no treatments remaining that could provide mortality benefit, palliative care was consulted and has been following.  Plan for palliative to follow outpatient.  See below for additional details   Hospital Course:  Assessment and Plan: * Flank pain Related to abnormal CT scan  New abnormal perirenal stranding on R ddx includes thrombosis of renal venous tributaries vs recent forniceal rupture vs hematoma along right kidney lower pole pelvis with effacement of venous structures.  Of note, she's not [name] candidate for anticoaguation with thrombocytopenia.  Also, discussion with urology notes another possible dx would be renal stone (limited eval with contrast). Renal artery [place] with patent IVC and renal veins bilaterally, right renal vein patent prox mid, distal and at hiluim.  Left renal vein patent distal and at hilum, limited visualization at prox and mid segments. UA with RBC's, negative leukocyte and nitrite -> 60,000 e. Coli, 10,000 enterobacter aerogenes -  both sensitive to cefepime - will discuss with ID Cefepime 5/18 - 5/22.  Will discharge with 2 days bactrim for 7 days total. S/p 2 units platelets 5/17 given possible hematoma  Discussed with urology, not need for any acute intervention from their standpoint Follow symptomatically,  consider repeat imaging - with improvement will hold off on repeating imaging. Pain management  Acute pyelonephritis abx as above  Pancytopenia (HCC) Given abnormal renal imaging, concern for possible hematoma S/p platelets x2 units 5/17 She has scattered petechiae and purpura  WBC count/ANC and platelets dropping again - discussed with oncology/neuro oncology who were ok with discharge and outpatient follow up    High grade glioma not classifiable by WHO criteria [facility]) S/p surgical resection and chemoradiation with temodar Currently temodar on hold per recent d/c summary Continue keppra and decadron Dr. Mickeal Skinner planning on discussion regarding goals of care, he'd broached this discussion 5/16 with family.   Appreciate Dr. Renda Rolls assistance -> open to visiting with palliative care, will consult at this time.  Continued palliative care discussions outpatient.   Shortness of breath CXR without acute abnormalities Currently on RA, hold additional w/u right now given stability and plan for goc discussions with neuro oncology Continue to follow - no additional complaints today - seems resolved  Acute metabolic encephalopathy Delirium precautions MRI 5/7 with mixed imaging response to therapy since march -> regressed enhancement in R midbrain, but new petichial enhancement in R optic radiations, new discontinuous T2/Flair hyperintensity in both medial R thalamus and R corona radiata Head CT with punctate focus of increased density in R pons (may represent small punctate focus of hemorrhage within previously noted tumor) - neuro oncology aware Overall mental status seems improved with abx, stable for discharge  Paroxysmal atrial fibrillation ([place]) Sinus brady No anticoagulation with thrombocytopenia  Flat affect Related to encephalopathy vs mood/depression Will monitor  Physical deconditioning Consider therapy eval based on discussion with neuro oncology Will try to get home  health PT/OT at home if able  Hypokalemia Replace and follow  Hyponatremia resolved  Bradycardia noted      Procedures: none   Consultations: Urology over phone Oncology Neuro oncology Discussed abx for UTI with ID informally   Discharge Exam: Vitals:   01/10/22 1958 01/11/22 0400  BP: 118/76 128/81  Pulse: 68 65  Resp: 18 16  Temp: 98.2 F (36.8 C) 98 F (36.7 C)  SpO2: 95% 96%   Friend at bedside Interpreter used Feeling better She'd like to go home  General: No acute distress. Cardiovascular: RRR Lungs: unlabored Abdomen: Soft, nontender, nondistended Neurological: Alert and oriented 3. Moves all extremities 4 . Cranial nerves II through XII grossly intact. Extremities: No clubbing or cyanosis. No edema.   Discharge Instructions   Discharge Instructions     Call MD for:  difficulty breathing, headache or visual disturbances   Complete by: As directed    Call MD for:  extreme fatigue   Complete by: As directed    Call MD for:  hives   Complete by: As directed    Call MD for:  persistant dizziness or light-headedness   Complete by: As directed    Call MD for:  persistant nausea and vomiting   Complete by: As directed    Call MD for:  redness, tenderness, or signs of infection (pain, swelling, redness, odor or green/yellow discharge around incision site)   Complete by: As directed    Call MD for:  severe uncontrolled pain   Complete by:  As directed    Call MD for:  temperature >100.4   Complete by: As directed    Diet - low sodium heart healthy   Complete by: As directed    Discharge instructions   Complete by: As directed    You were seen for right sided flank pain and Besnik Febus urinary tract infection.  Your CT scan showed abnormal findings in the kidney, the cause was unclear, but I wonder if this may all be related to your infection.    Please follow up with neuro oncology as an outpatient.  Your blood counts have continued to decrease.   You'll need follow up outpatient to determine whether you'll need transfusions or other treatment going forward.   Continue goals of care discussions with Dr. Mickeal Skinner and palliative care.  We'll have palliative care follow outpatient with you.  Avoid NSAIDS (ibuprofen, advil, naproxon, etc) and other over the counter medicines unless you clear them with neuro oncology or oncology.  Return for new, recurrent, or worsening symptoms.  Please ask your PCP to request records from this hospitalization so they know what was done and what the next steps will be.   Increase activity slowly   Complete by: As directed       Allergies as of 01/11/2022       Reactions   Latex Rash        Medication List     TAKE these medications    acetaminophen 500 MG tablet Commonly known as: TYLENOL Take 1,000 mg by mouth every 6 (six) hours as needed for moderate pain or headache.   calcium carbonate 1500 (600 Ca) MG Tabs tablet Commonly known as: OSCAL Take 1,500 mg by mouth 2 (two) times daily with Domnic Vantol meal.   dexamethasone 4 MG tablet Commonly known as: DECADRON Take 1 tablet (4 mg total) by mouth 2 (two) times daily with Lafawn Lenoir meal.   famotidine 20 MG tablet Commonly known as: Pepcid Take 1 tablet (20 mg total) by mouth 2 (two) times daily.   levETIRAcetam 500 MG tablet Commonly known as: Keppra Take 1 tablet (500 mg total) by mouth 2 (two) times daily.   ondansetron 8 MG tablet Commonly known as: Zofran Take 1 tablet (8 mg total) by mouth 2 (two) times daily as needed (nausea and vomiting). May take 30-60 minutes prior to Temodar administration if nausea/vomiting occurs.   sulfamethoxazole-trimethoprim 800-160 MG tablet Commonly known as: BACTRIM DS Take 1 tablet by mouth every 12 (twelve) hours for 2 days.   VITA-C PO Take 500 mg by mouth daily.   VITAMIN D PO Take 1,000 Units by mouth daily.       Allergies  Allergen Reactions   Latex Rash      The results of significant  diagnostics from this hospitalization (including imaging, microbiology, ancillary and laboratory) are listed below for reference.    Significant Diagnostic Studies: CT HEAD WO CONTRAST (5MM)  Result Date: 01/07/2022 CLINICAL DATA:  Mental status change, unknown cause EXAM: CT HEAD WITHOUT CONTRAST TECHNIQUE: Contiguous axial images were obtained from the base of the skull through the vertex without intravenous contrast. RADIATION DOSE REDUCTION: This exam was performed according to the departmental dose-optimization program which includes automated exposure control, adjustment of the mA and/or kV according to patient size and/or use of iterative reconstruction technique. COMPARISON:  12/26/2021 CT head, 12/27/2021 MRI head FINDINGS: Brain: Redemonstrated right temporal lobe resection cavity and encephalomalacia. Redemonstrated mildly hyperdense tumor in the region of the right temporal lobe,  cerebral peduncle, midbrain, and pons, which appears overall unchanged from the prior exam; more punctate focus of increased density in the right pons (series 2, image 8), which may represent Alejandro Adcox small punctate focus of hemorrhage. No definite acute infarct, mass effect, or midline shift. No hydrocephalus or extra-axial collection. Periventricular white matter changes, likely the sequela of chronic small vessel ischemic disease. Vascular: No hyperdense vessel. Skull: Right frontotemporal craniotomy. No acute fracture or suspicious osseous lesion. Sinuses/Orbits: No acute finding. Other: The mastoids are well aerated. IMPRESSION: Punctate focus of increased density in the right pons, which may represent Hera Celaya small punctate focus of hemorrhage within the previously noted tumor. Otherwise unchanged tumor in the right temporal lobe, cerebral peduncle, midbrain, and pons. Electronically Signed   By: Merilyn Baba M.D.   On: 01/07/2022 11:19   CT Head Wo Contrast  Result Date: 12/26/2021 CLINICAL DATA:  Altered mental status EXAM:  CT HEAD WITHOUT CONTRAST TECHNIQUE: Contiguous axial images were obtained from the base of the skull through the vertex without intravenous contrast. RADIATION DOSE REDUCTION: This exam was performed according to the departmental dose-optimization program which includes automated exposure control, adjustment of the mA and/or kV according to patient size and/or use of iterative reconstruction technique. COMPARISON:  None Available. FINDINGS: Brain: Right temporal lobe encephalomalacia at site of prior tumor resection. There is hyperdensity near the right cerebral peduncle corresponding to previously demonstrated area of tumor. No acute hemorrhage or extra-axial collection. Generalized volume loss with periventricular white matter changes. Vascular: No abnormal hyperdensity of the major intracranial arteries or dural venous sinuses. No intracranial atherosclerosis. Skull: Remote right craniotomy. Sinuses/Orbits: No fluid levels or advanced mucosal thickening of the visualized paranasal sinuses. No mastoid or middle ear effusion. The orbits are normal. IMPRESSION: 1. No acute intracranial abnormality. 2. Right temporal lobe encephalomalacia at site of prior tumor resection. Electronically Signed   By: Ulyses Jarred M.D.   On: 12/26/2021 23:07   CT Angio Chest PE W and/or Wo Contrast  Result Date: 12/16/2021 CLINICAL DATA:  Shortness of breath and left lower extremity edema. Brain tumor. EXAM: CT ANGIOGRAPHY CHEST WITH CONTRAST TECHNIQUE: Multidetector CT imaging of the chest was performed using the standard protocol during bolus administration of intravenous contrast. Multiplanar CT image reconstructions and MIPs were obtained to evaluate the vascular anatomy. RADIATION DOSE REDUCTION: This exam was performed according to the departmental dose-optimization program which includes automated exposure control, adjustment of the mA and/or kV according to patient size and/or use of iterative reconstruction technique.  CONTRAST:  63m OMNIPAQUE IOHEXOL 350 MG/ML SOLN COMPARISON:  None. FINDINGS: Cardiovascular: Negative for pulmonary embolus. Atherosclerotic calcification of the aorta. Enlarged pulmonic trunk. Heart is at the upper limits of normal in size. No pericardial effusion. Mediastinum/Nodes: No pathologically enlarged mediastinal, hilar or axillary lymph nodes. Esophagus is grossly unremarkable. Lungs/Pleura: Ground-glass is seen in both lower lobes and may be due to slight expiratory phase imaging. No dense airspace consolidation. No pleural fluid. Airway is unremarkable. Upper Abdomen: Visualized portions of the liver, adrenal glands, kidneys, spleen, pancreas, stomach and bowel are grossly unremarkable. Musculoskeletal: Degenerative changes in the spine. No worrisome lytic or sclerotic lesions. Review of the MIP images confirms the above findings. IMPRESSION: 1. Negative for pulmonary embolus. 2. No definitive findings to explain the patient's clinical history. 3.  Aortic atherosclerosis (ICD10-I70.0). 4. Enlarged pulmonic trunk, indicative of pulmonary arterial hypertension. Electronically Signed   By: MLorin PicketM.D.   On: 12/16/2021 16:10   MR BRAIN W WO  CONTRAST  Result Date: 12/27/2021 CLINICAL DATA:  61 year old female with high-grade glioma status post subtotal resection. Altered mental status. EXAM: MRI HEAD WITHOUT AND WITH CONTRAST TECHNIQUE: Multiplanar, multiecho pulse sequences of the brain and surrounding structures were obtained without and with intravenous contrast. CONTRAST:  65m GADAVIST GADOBUTROL 1 MMOL/ML IV SOLN COMPARISON:  Head CT yesterday. Restaging brain MRI 10/27/2021 and earlier. FINDINGS: Brain: Rounded, masslike enhancement at the right midbrain and cerebral peduncle has regressed and is now indistinct on series 16, image 57. However, nearby Patchy and confluent enhancement at the right optic radiations appears increased and is not obviously vascular related (series 16, image 58  and also postcontrast coronal series 19, image 16). Regional temporal lobe resection cavity has not significantly changed in size or configuration. Stable associated hemosiderin. And confluent regional T2 and FLAIR hyperintensity is stable to mildly improved. However, there is new discontinuous T2/FLAIR abnormality in the posteromedial right thalamus on series 11, image 25. And ongoing discontinuous although improved T2 hyperintensity in the right brainstem at the pons (series 8, image 7). Additionally, confluent right corona radiata T2 and FLAIR hyperintensity has progressed since March but is facilitated on diffusion (series 11, image 36), nonenhancing. On DWI heterogeneity in the tumor region and right brainstem is not significantly changed. Post craniotomy dural thickening along the right middle cranial fossa is stable. No midline shift. Stable ventricle size and configuration. No restricted diffusion suggestive of acute infarction. No acute intracranial hemorrhage. Cervicomedullary junction and pituitary are within normal limits. Stable gray and white matter signal elsewhere. Vascular: Major intracranial vascular flow voids are stable. The major dural venous sinuses are enhancing and appear to be patent. Skull and upper cervical spine: Negative visible cervical spine and spinal cord. Stable bone marrow signal. Sinuses/Orbits: Stable and negative. Other: Visible internal auditory structures appear normal. Stable scalp and face. IMPRESSION: 1. Mixed imaging response to therapy since March: Regressed enhancement in the right midbrain, but new petechial enhancement in the right optic radiations. New discontinuous T2/FLAIR hyperintensity in both the medial right thalamus and right corona radiata. Otherwise essentially stable appearance of infiltrating tumor in the right hemisphere and brainstem. 2. No new intracranial abnormality. Electronically Signed   By: HGenevie AnnM.D.   On: 12/27/2021 09:09   CT ABDOMEN PELVIS  W CONTRAST  Result Date: 01/06/2022 CLINICAL DATA:  Abdominal pain, especially in the right upper quadrant, over the last 2 days. High-grade glioma, completed radiation therapy 1 month ago. EXAM: CT ABDOMEN AND PELVIS WITH CONTRAST TECHNIQUE: Multidetector CT imaging of the abdomen and pelvis was performed using the standard protocol following bolus administration of intravenous contrast. RADIATION DOSE REDUCTION: This exam was performed according to the departmental dose-optimization program which includes automated exposure control, adjustment of the mA and/or kV according to patient size and/or use of iterative reconstruction technique. CONTRAST:  1028mOMNIPAQUE IOHEXOL 300 MG/ML  SOLN COMPARISON:  12/26/2021 FINDINGS: Despite efforts by the technologist and patient, motion artifact is present on today's exam and could not be eliminated. This reduces exam sensitivity and specificity. Lower chest: Dependent subsegmental atelectasis or scarring in both lower lobes, similar to the 12/26/2021 exam. Hepatobiliary: Cholecystectomy.  Otherwise unremarkable. Pancreas: Unremarkable Spleen: Unremarkable Adrenals/Urinary Tract: New abnormal right perirenal fluid. 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 Accentuated corticomedullary differentiation in the right kidney lower pole compared to the left kidney and the right kidney upper pole, suspicious for mildly delayed right kidney lower pole nephrogram.  Reduced visualization of right kidney lower pole venous tributaries compared to the 12/26/2021 exam, cannot exclude thrombosis of these tributaries although the motion artifact on portal venous and delayed phase images causes some diagnostic uncertainty. On delayed phase images, parenchymal enhancement in the right kidney lower pole matches the other 2 kidneys and there is some excretion contrast from the lower pole moiety. Small bilateral hypodense  lesions are technically too small to characterize although statistically likely to be benign. Urinary bladder unremarkable. Stomach/Bowel: Normal appendix. Prominent stool throughout the colon favors constipation. Vascular/Lymphatic: As noted in the urinary tract section above, there is some reduce conspicuity of venous structures to the right kidney lower pole raising the possibility of thrombosis of venous tributaries from the right kidney lower pole. Otherwise unremarkable. Gonadal veins patent. Reproductive: Unremarkable Other: No supplemental non-categorized findings. Musculoskeletal: Right eccentric disc bulge at L5-S1. IMPRESSION: 1. At least partially duplicated right renal collecting system, with new abnormal perirenal stranding on the right indicating the right kidney is the likely cause for the patient's abdominal pain. There is some subtle additional findings including slightly earlier contrast phase in the right kidney lower pole (with greater corticomedullary differentiation) and reduced conspicuity of right renal vein structures in the lower pole compared to the upper pole and left kidney. There is also increased fullness of the right kidney lower pole structures on delayed images compared to 12/26/2021. Motion artifact complicates the assessment. The patient has known substantial thrombocytopenia and hematuria today. Possibilities given this appearance and scenario may include thrombosis of renal venous tributaries from the right kidney lower pole moiety; recent forniceal rupture of the right kidney lower pole with some resulting effacement of venous structures; or hematoma along the right kidney lower pole pelvis with effacement of venous structures. Doppler renal vascular sonography with attention to the lower pole of the right kidney may provide helpful complementary information. 2. Bibasilar scarring or subsegmental atelectasis. 3.  Prominent stool throughout the colon favors constipation. 4.  Disc bulge at L5-S1. These results were called by telephone at the time of interpretation on 01/06/2022 at 2:55 pm to provider Dr. Dorie Rank , who verbally acknowledged these results. Electronically Signed   By: Van Clines M.D.   On: 01/06/2022 15:02   CT Abdomen Pelvis W Contrast  Result Date: 12/26/2021 CLINICAL DATA:  Abdominal pain. EXAM: CT ABDOMEN AND PELVIS WITH CONTRAST TECHNIQUE: Multidetector CT imaging of the abdomen and pelvis was performed using the standard protocol following bolus administration of intravenous contrast. RADIATION DOSE REDUCTION: This exam was performed according to the departmental dose-optimization program which includes automated exposure control, adjustment of the mA and/or kV according to patient size and/or use of iterative reconstruction technique. CONTRAST:  133m OMNIPAQUE IOHEXOL 300 MG/ML  SOLN COMPARISON:  CT abdomen pelvis dated 06/10/2021. FINDINGS: Lower chest: Bibasilar linear atelectasis. No intra-abdominal free air or free fluid. Hepatobiliary: The liver is unremarkable. No intrahepatic biliary dilatation. Cholecystectomy. Pancreas: Unremarkable. No pancreatic ductal dilatation or surrounding inflammatory changes. Spleen: Normal in size without focal abnormality. Adrenals/Urinary Tract: The adrenal glands unremarkable the kidneys, visualized ureters, and urinary bladder appear unremarkable. Stomach/Bowel: There is moderate stool throughout the colon. There is no bowel obstruction or active inflammation. The appendix is normal. Vascular/Lymphatic: The abdominal aorta and IVC are unremarkable. No portal venous gas. There is no adenopathy. Reproductive: The uterus and ovaries are grossly unremarkable. No pelvic mass. Other: None Musculoskeletal: No acute or significant osseous findings. IMPRESSION: 1. No acute intra-abdominal or pelvic pathology. 2. Moderate colonic  stool burden. No bowel obstruction. Normal appendix. Electronically Signed   By: Anner Crete M.D.   On: 12/26/2021 23:22   DG CHEST PORT 1 VIEW  Result Date: 01/06/2022 CLINICAL DATA:  [place]. EXAM: PORTABLE CHEST 1 VIEW COMPARISON:  Dec 27, 2021. FINDINGS: The heart size and mediastinal contours are within normal limits. Both lungs are clear. The visualized skeletal structures are unremarkable. IMPRESSION: No active disease. Electronically Signed   By: Marijo Conception M.D.   On: 01/06/2022 17:41   Portable chest 1 View  Result Date: 12/27/2021 CLINICAL DATA:  Generalized weakness. EXAM: PORTABLE CHEST 1 VIEW COMPARISON:  Chest radiograph dated 12/16/2021 and CT dated 12/16/2021 FINDINGS: Minimal left lung base atelectasis. No focal consolidation, pleural effusion, pneumothorax. Stable cardiac silhouette. No acute osseous pathology. IMPRESSION: No active disease. Electronically Signed   By: Anner Crete M.D.   On: 12/27/2021 00:47   DG Chest Port 1 View  Result Date: 12/16/2021 CLINICAL DATA:  Shortness of breath EXAM: PORTABLE CHEST 1 VIEW COMPARISON:  None. FINDINGS: The heart size and mediastinal contours are within normal limits. Both lungs are clear. The visualized skeletal structures are unremarkable. IMPRESSION: No acute abnormality of the lungs in AP portable projection. Electronically Signed   By: Delanna Ahmadi M.D.   On: 12/16/2021 15:26   VAS [place] LOWER EXTREMITY VENOUS (DVT) (ONLY [facility] & [facility])  Result Date: 12/16/2021  Lower Venous DVT Study Patient Name:  SAMANDA BUSKE  Date of Exam:   12/16/2021 Medical Rec #: [id number]     Accession #:    [id number] Date of Birth: 1961-04-09    Patient Gender: F Patient Age:   19 years Exam Location:  [facility] Procedure:      VAS [place] LOWER EXTREMITY VENOUS (DVT) Referring Phys: JOSEPH ZAMMIT --------------------------------------------------------------------------------  Indications: SOB, and LLE edema.  Risk Factors: Chemotherapy. Comparison Study: No previous exams Performing Technologist: Jody Hill RVT, RDMS  Examination  Guidelines: Neville Walston complete evaluation includes B-mode imaging, spectral Doppler, color Doppler, and power Doppler as needed of all accessible portions of each vessel. Bilateral testing is considered an integral part of Armanii Pressnell complete examination. Limited examinations for reoccurring indications may be performed as noted. The reflux portion of the exam is performed with the patient in reverse Trendelenburg.  +---------+---------------+---------+-----------+----------+--------------+ RIGHT    CompressibilityPhasicitySpontaneityPropertiesThrombus Aging +---------+---------------+---------+-----------+----------+--------------+ CFV      Full           Yes      Yes                                 +---------+---------------+---------+-----------+----------+--------------+ SFJ      Full                                                        +---------+---------------+---------+-----------+----------+--------------+ FV Prox  Full           Yes      Yes                                 +---------+---------------+---------+-----------+----------+--------------+ FV Mid   Full           Yes      Yes                                 +---------+---------------+---------+-----------+----------+--------------+  FV DistalFull           Yes      Yes                                 +---------+---------------+---------+-----------+----------+--------------+ PFV      Full                                                        +---------+---------------+---------+-----------+----------+--------------+ POP      Full           Yes      Yes                                 +---------+---------------+---------+-----------+----------+--------------+ PTV      Full                                                        +---------+---------------+---------+-----------+----------+--------------+ PERO     Full                                                         +---------+---------------+---------+-----------+----------+--------------+   +---------+---------------+---------+-----------+----------+--------------+ LEFT     CompressibilityPhasicitySpontaneityPropertiesThrombus Aging +---------+---------------+---------+-----------+----------+--------------+ CFV      Full           Yes      Yes                                 +---------+---------------+---------+-----------+----------+--------------+ SFJ      Full                                                        +---------+---------------+---------+-----------+----------+--------------+ FV Prox  Full           Yes      Yes                                 +---------+---------------+---------+-----------+----------+--------------+ FV Mid   Full           Yes      Yes                                 +---------+---------------+---------+-----------+----------+--------------+ FV DistalFull           Yes      Yes                                 +---------+---------------+---------+-----------+----------+--------------+ PFV      Full                                                        +---------+---------------+---------+-----------+----------+--------------+  POP      Full           Yes      Yes                                 +---------+---------------+---------+-----------+----------+--------------+ PTV      Full                                                        +---------+---------------+---------+-----------+----------+--------------+ PERO     Full                                                        +---------+---------------+---------+-----------+----------+--------------+     Summary: BILATERAL: - No evidence of deep vein thrombosis seen in the lower extremities, bilaterally. - No evidence of superficial venous thrombosis in the lower extremities, bilaterally. -No evidence of popliteal cyst, bilaterally.  LEFT: - subcutaneous edema seen in  area of popliteal fossa and calf.  *See table(s) above for measurements and observations. Electronically signed by Harold Barban MD on 12/16/2021 at 8:14:16 PM.    Final    VAS US RENAL ARTERY DUPLEX  Result Date: 01/06/2022 ABDOMINAL VISCERAL Patient Name:  Patricia Stevenson  Date of Exam:   01/06/2022 Medical Rec #: [id number]     Accession #:    [id number] Date of Birth: [birth year]    Patient Gender: F Patient Age:   43 years Exam Location:  [facility] Procedure:      VAS US RENAL ARTERY DUPLEX Referring Phys: [id number] JON KNAPP -------------------------------------------------------------------------------- Indications: Suspicion for renal vein thrombosis on CT Limitations: Air/bowel gas, obesity and patient immobility. Comparison Study: 01-06-2022 CT abdomen pelvis with contrast Performing Technologist: Rogelia Rohrer RVT, RDMS  Examination Guidelines: Nihal Marzella complete evaluation includes B-mode imaging, spectral Doppler, color Doppler, and power Doppler as needed of all accessible portions of each vessel. Bilateral testing is considered an integral part of Khiana Camino complete examination. Limited examinations for reoccurring indications may be performed as noted.  Duplex Findings:  Summary: Renal:  Bilateral: IVC and renal veins are patent bilaterally. Right renal            vein patent prox, mid, distal, and at hilum, Left renal            vein patent distal and at hilum. Limited visualization at            prox and mid segments.  *See table(s) above for measurements and observations.  Diagnosing physician: Jamelle Haring  Electronically signed by Jamelle Haring on 01/06/2022 at 9:28:42 PM.    Final     Microbiology: Recent Results (from the past 240 hour(s))  Urine Culture     Status: Abnormal   Collection Time: 01/06/22  3:51 PM   Specimen: Urine, Clean Catch  Result Value Ref Range Status   Specimen Description   Final    URINE, CLEAN CATCH Performed at [facility] [address],  [place], [place] [address]    Special Requests   Final    NONE Performed at [facility], [place]  [address], [place], [place] [address]    Culture ([facility])  Final    60,000 COLONIES/mL ESCHERICHIA COLI 10,000 COLONIES/mL ENTEROBACTER AEROGENES    Report Status 01/09/2022 FINAL  Final   Organism ID, Bacteria ESCHERICHIA COLI (Solomia Harrell)  Final   Organism ID, Bacteria ENTEROBACTER AEROGENES (Rodina Pinales)  Final      Susceptibility   Enterobacter aerogenes - MIC*    CEFAZOLIN >=64 RESISTANT Resistant     CEFEPIME <=0.12 SENSITIVE Sensitive     CEFTRIAXONE <=0.25 SENSITIVE Sensitive     CIPROFLOXACIN <=0.25 SENSITIVE Sensitive     GENTAMICIN <=1 SENSITIVE Sensitive     IMIPENEM 0.5 SENSITIVE Sensitive     NITROFURANTOIN 64 INTERMEDIATE Intermediate     TRIMETH/SULFA <=20 SENSITIVE Sensitive     PIP/TAZO <=4 SENSITIVE Sensitive     * 10,000 COLONIES/mL ENTEROBACTER AEROGENES   Escherichia coli - MIC*    AMPICILLIN >=32 RESISTANT Resistant     CEFAZOLIN >=64 RESISTANT Resistant     CEFEPIME 2 SENSITIVE Sensitive     CEFTRIAXONE >=64 RESISTANT Resistant     CIPROFLOXACIN 1 RESISTANT Resistant     GENTAMICIN <=1 SENSITIVE Sensitive     IMIPENEM <=0.25 SENSITIVE Sensitive     NITROFURANTOIN <=16 SENSITIVE Sensitive     TRIMETH/SULFA <=20 SENSITIVE Sensitive     AMPICILLIN/SULBACTAM >=32 RESISTANT Resistant     PIP/TAZO 64 INTERMEDIATE Intermediate     * 60,000 COLONIES/mL ESCHERICHIA COLI     Labs: Basic Metabolic Panel: Recent Labs  Lab 01/07/22 0558 01/08/22 0531 01/09/22 0606 01/10/22 0522 01/11/22 0439  NA 135 135 133* 135 136  K 2.9* 4.1 3.8 3.8 3.8  CL 102 106 104 104 107  CO2 25 21* '23 24 23  '$ GLUCOSE 80 120* 128* 143* 155*  BUN '19 19 19 '$ 22* 15  CREATININE 0.33* 0.33* 0.33* 0.31* <0.30*  CALCIUM 8.1* 8.4* 8.3* 8.5* 8.0*  MG 2.0  --  2.1 2.2 2.0  PHOS  --   --  2.7 3.0 2.3*   Liver Function Tests: Recent Labs  Lab 01/06/22 1148 01/07/22 0558 01/09/22 0606  01/10/22 0522 01/11/22 0439  AST '31 22 16 18 21  '$ ALT 69* 55* 56* 58* 68*  ALKPHOS 57 42 44 48 47  BILITOT 0.6 0.8  0.9 0.6 0.4 0.3  PROT 6.1* 5.0* 5.0* 5.0* 4.9*  ALBUMIN 3.4* 2.7* 2.6* 2.7* 2.6*   Recent Labs  Lab 01/06/22 1148  LIPASE 65*   No results for input(s): AMMONIA in the last 168 hours. CBC: Recent Labs  Lab 01/06/22 1148 01/07/22 0558 01/08/22 0531 01/09/22 0606 01/10/22 0522 01/11/22 0439  WBC 1.6* 0.7* 2.4* 2.6* 2.3* 1.9*  NEUTROABS 1.4*  --  2.2 2.0 1.6* 1.5*  HGB 12.8 9.4* 10.4* 9.7* 9.5* 9.1*  HCT 35.9* 26.9* 30.1* 27.1* 26.8* 26.3*  MCV 93.5 94.1 95.3 94.1 93.4 94.6  PLT 23* 122* 99* 72* 54* 37*   Cardiac Enzymes: No results for input(s): CKTOTAL, CKMB, CKMBINDEX, TROPONINI in the last 168 hours. BNP: BNP (last 3 results) No results for input(s): BNP in the last 8760 hours.  ProBNP (last 3 results) No results for input(s): PROBNP in the last 8760 hours.  CBG: No results for input(s): GLUCAP in the last 168 hours.     Signed:  Fayrene Helper MD.  [facility] 01/11/2022, 3:30 PM

## 2022-01-11 NOTE — Telephone Encounter (Signed)
Patient is expected to discharge.  Dr Burr Medico would like to have patient set up for lab/infusion appointment when she comes back to see Dr Mickeal Skinner.  Next available is next week.  Scheduling message sent.

## 2022-01-11 NOTE — Progress Notes (Signed)
[facility])  Referral received for outpatient palliative care services.  Noted that hospice was broached but family needed more time to discuss before making a decision.  Noted that close follow up in likely required.  Venia Carbon DNP, RN [facility] Liaison

## 2022-01-11 NOTE — TOC Transition Note (Signed)
Transition of Care [facility]) - CM/SW Discharge Note   Patient Details  Name: Patricia Stevenson MRN: [id number] Date of Birth: Jul 25, 1961  Transition of Care [facility]) CM/SW Contact:  Lynnell Catalan, RN Phone Number: 01/11/2022, 3:55 PM   Clinical Narrative:     Pt to dc back home today via PTAR. Address was confirmed with pt. [facility] liaison [place] was contacted for referral for HHPT/OT.

## 2022-01-12 DIAGNOSIS — R109 Unspecified abdominal pain: Secondary | ICD-10-CM | POA: Diagnosis not present

## 2022-01-12 DIAGNOSIS — R5381 Other malaise: Secondary | ICD-10-CM | POA: Diagnosis not present

## 2022-01-12 LAB — COMPREHENSIVE METABOLIC PANEL
ALT: 59 U/L — ABNORMAL HIGH (ref 0–44)
AST: 19 U/L (ref 15–41)
Albumin: 2.4 g/dL — ABNORMAL LOW (ref 3.5–5.0)
Alkaline Phosphatase: 45 U/L (ref 38–126)
Anion gap: 5 (ref 5–15)
BUN: 14 mg/dL (ref 6–20)
CO2: 23 mmol/L (ref 22–32)
Calcium: 7.9 mg/dL — ABNORMAL LOW (ref 8.9–10.3)
Chloride: 107 mmol/L (ref 98–111)
Creatinine, Ser: <0.3 mg/dL — ABNORMAL LOW (ref 0.44–1.00)
Glucose, Bld: 157 mg/dL — ABNORMAL HIGH (ref 70–99)
Potassium: 3.5 mmol/L (ref 3.5–5.1)
Sodium: 135 mmol/L (ref 135–145)
Total Bilirubin: 0.5 mg/dL (ref 0.3–1.2)
Total Protein: 4.5 g/dL — ABNORMAL LOW (ref 6.5–8.1)

## 2022-01-12 LAB — CBC WITH DIFFERENTIAL/PLATELET
Abs Immature Granulocytes: 0.19 10*3/uL — ABNORMAL HIGH (ref 0.00–0.07)
Basophils Absolute: 0 10*3/uL (ref 0.0–0.1)
Basophils Relative: 1 %
Eosinophils Absolute: 0 10*3/uL (ref 0.0–0.5)
Eosinophils Relative: 0 %
HCT: 25.2 % — ABNORMAL LOW (ref 36.0–46.0)
Hemoglobin: 8.9 g/dL — ABNORMAL LOW (ref 12.0–15.0)
Immature Granulocytes: 16 %
Lymphocytes Relative: 4 %
Lymphs Abs: 0.1 10*3/uL — ABNORMAL LOW (ref 0.7–4.0)
MCH: 33.5 pg (ref 26.0–34.0)
MCHC: 35.3 g/dL (ref 30.0–36.0)
MCV: 94.7 fL (ref 80.0–100.0)
Monocytes Absolute: 0.1 10*3/uL (ref 0.1–1.0)
Monocytes Relative: 11 %
Neutro Abs: 0.8 10*3/uL — ABNORMAL LOW (ref 1.7–7.7)
Neutrophils Relative %: 68 %
Platelets: 30 10*3/uL — ABNORMAL LOW (ref 150–400)
RBC: 2.66 MIL/uL — ABNORMAL LOW (ref 3.87–5.11)
RDW: 17.3 % — ABNORMAL HIGH (ref 11.5–15.5)
WBC: 1.2 10*3/uL — CL (ref 4.0–10.5)
nRBC: 5.7 % — ABNORMAL HIGH (ref 0.0–0.2)

## 2022-01-12 LAB — PHOSPHORUS: Phosphorus: 2.9 mg/dL (ref 2.5–4.6)

## 2022-01-12 LAB — MAGNESIUM: Magnesium: 2 mg/dL (ref 1.7–2.4)

## 2022-01-12 MED ORDER — SODIUM CHLORIDE 0.9% IV SOLUTION: Freq: Once | INTRAVENOUS | Status: AC

## 2022-01-12 NOTE — Progress Notes (Signed)
Occupational Therapy Treatment Patient Details Name: Patricia Stevenson MRN: [id number] DOB: [birth year] Today's Date: 01/12/2022   History of present illness Patricia Stevenson is a 61 y.o. 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 CT showed New abnormal perirenal stranding on R  ddx includes thrombosis of renal venous tributaries vs recent forniceal rupture vs hematoma along right kidney lower pole pelvis with effacement of venous structures   OT comments  Patient was in bed with finishing up cleaning up with NT and friend at start of session. Patient was min A to don hospital gown at bed level on this date with increased time. Patient reported being cold with two heated blankets retrieved and provided to patient. Patient was able to tolerate chair position in bed for about 1 min to take a sip of ginger ale with education on continuing to participate in tasks she knows she can do like self feeding, drinks, washing face ect. Patient was noted to appear to be upset reporting she understood everything therapist was saying and that she was tired today. Patient declined to participate in therapy further at this time.patient was noted to speak to friend in [place] with friend conveying that patient reported she is dying and that she does not want to.  Patient was educated that therapy was not mandatory and if she wanted it to stop she just needed to let us know. Patient's friend asked for this therapist to call patients husband. Patient's husband was called but unavailable at this time, message was left. Patients nurse was updated on patients reports as was PT. OT to continue to follow acutely until decision has been made regarding d/c planning.    Recommendations for follow up therapy are one component of a multi-disciplinary discharge planning process, led by the attending physician.   Recommendations may be updated based on patient status, additional functional criteria and insurance authorization.    Follow Up Recommendations  Home health OT    Assistance Recommended at Discharge Frequent or constant Supervision/Assistance  Patient can return home with the following  A lot of help with bathing/dressing/bathroom;Assistance with cooking/housework;Direct supervision/assist for financial management;Assist for transportation;Help with stairs or ramp for entrance;Direct supervision/assist for medications management;Two people to help with walking and/or transfers   Equipment Recommendations       Recommendations for Other Services      Precautions / Restrictions Precautions Precautions: Fall Precaution Comments: incontinent, L side weakness/inattention Restrictions Weight Bearing Restrictions: No       Mobility Bed Mobility                    Transfers                         Balance                                           ADL either performed or assessed with clinical judgement   ADL Overall ADL's : Needs assistance/impaired   Eating/Feeding Details (indicate cue type and reason): patient asked for a sip of ginger ale sitting in chair position with friend grabbing drink and holdign to mouth. friend and patient were educated on importance of functional strength and participation in simple tasks like this as much as possible. patient was  agitated with therapist at this time reporting she understands everything that therapist is saying and that she does not want to do any therapy at this time due to fatigue. patient was educated that if she did not want therapy to come back she could let therapist know and we would not disturb her anymore. patient did not make a decision on therapy at this time with patients friend asking for therpaist to contact husband. husband was called but unavailable at this time and message was left.              Upper Body Dressing : Moderate assistance;Bed level Upper Body Dressing Details (indicate cue type and reason): to don hospital gown with cues to lift each arm to don gown in bed. patient requested more blankets with two heated blankets retrieved and provided to patient at this time.                   General ADL Comments: patient declined to sit on edge of bed but did tolerate sitting in chair position for about 1 min on this date while taking a sip of ginger ale as noted above. patient reported she was fatigued and did not want to do therapy.    Extremity/Trunk Assessment              Vision       Perception     Praxis      Cognition Arousal/Alertness: Awake/alert Behavior During Therapy: Flat affect Overall Cognitive Status: Difficult to assess                                 General Comments: patient reported that she is able to understand therapist with friend in room who she was noted to speak spanish with at times during session. patient was noted to have minimal verbalizations towards therapist during time in room.        Exercises      Shoulder Instructions       General Comments      Pertinent Vitals/ Pain       Pain Assessment Pain Assessment: No/denies pain  Home Living                                          Prior Functioning/Environment              Frequency  Min 2X/week        Progress Toward Goals  OT Goals(current goals can now be found in the care plan section)  Progress towards OT goals: OT to reassess next treatment (patient made a comment to friend in room in [place] reporting she was dying and tired. patients desire to participate in therapy was not decided upon at this time with friend asking to call husband.)     Plan Discharge plan remains appropriate    Co-evaluation                 AM-PAC OT "6 Clicks" Daily Activity     Outcome Measure   Help from another  person eating meals?: A Lot Help from another person taking care of personal grooming?: A Lot Help from another person toileting, which includes using toliet, bedpan, or urinal?: Total Help from another person bathing (including washing, rinsing, drying)?: A Lot Help from another person to  put on and taking off regular upper body clothing?: A Lot Help from another person to put on and taking off regular lower body clothing?: A Lot 6 Click Score: 11    End of Session    OT Visit Diagnosis: Muscle weakness (generalized) (M62.81)   Activity Tolerance Patient limited by fatigue   Patient Left in bed;with call bell/phone within reach;with family/visitor present;with bed alarm set   Nurse Communication Mobility status        Time: 9444-6190 OT Time Calculation (min): 17 min  Charges: OT General Charges $OT Visit: 1 Visit OT Treatments $Self Care/Home Management : 8-22 mins  Jackelyn Poling OTR/L, MS Acute Rehabilitation Department Office# [phone] Pager# [phone]   Marcellina Millin 01/12/2022, 11:33 AM

## 2022-01-12 NOTE — Progress Notes (Signed)
Daily Progress Note   Patient Name: Patricia Stevenson       Date: 01/12/2022 DOB: 1960-12-21  Age: 61 y.o. MRN#: [id number] Attending Physician: Elodia Florence., * Primary Care Physician: Isaac Bliss, Rayford Halsted, MD Admit Date: 01/06/2022  Reason for Consultation/Follow-up: Establishing goals of care  Subjective: Awake alert Sitting up in bed Friend at bedside.   Length of Stay: 6  Current Medications: Scheduled Meds:   sodium chloride   Intravenous Once   dexamethasone  4 mg Oral BID WC   famotidine  20 mg Oral BID   levETIRAcetam  500 mg Oral BID   sulfamethoxazole-trimethoprim  1 tablet Oral Q12H    Continuous Infusions:  sodium chloride 125 mL/hr at 01/12/22 1104    PRN Meds: acetaminophen **OR** [DISCONTINUED] acetaminophen, lidocaine, ondansetron **OR** ondansetron (ZOFRAN) IV, oxyCODONE, oxyCODONE, polyethylene glycol  Physical Exam         Awake alert oriented Appears chronically ill Has scattered petechiae and ecchymosis Answers questions appropriately Follows commands  Vital Signs: BP 132/72 (BP Location: Right Arm)   Pulse 61   Temp 98 F (36.7 C) (Oral)   Resp 18   Ht 5' (1.524 m) Comment: per 1 week ago documentation  Wt 55 kg   SpO2 96%   BMI 23.68 kg/m  SpO2: SpO2: 96 % O2 Device: O2 Device: Room Air O2 Flow Rate: O2 Flow Rate (L/min): 0 L/min  Intake/output summary:  Intake/Output Summary (Last 24 hours) at 01/12/2022 1503 Last data filed at 01/12/2022 0500 Gross per 24 hour  Intake 1375.41 ml  Output 3500 ml  Net -2124.59 ml   LBM: Last BM Date : 01/12/22 Baseline Weight: Weight: 57 kg Most recent weight: Weight: 55 kg       Palliative Assessment/Data:    Flowsheet Rows    Flowsheet Row Most Recent Value  Intake Tab    Referral Department Hospitalist  Unit at Time of Referral Med/Surg Unit  Palliative Care Primary Diagnosis Cancer  Date Notified 01/07/22  Palliative Care Type New Palliative care  Reason for referral Clarify Goals of Care  Date of Admission 01/06/22  Date first seen by Palliative Care 01/08/22  # of days Palliative referral response time 1 Day(s)  # of days IP prior to Palliative referral 1  Clinical Assessment   Psychosocial &  Spiritual Assessment   Palliative Care Outcomes        Patient Active Problem List   Diagnosis Date Noted   Acute pyelonephritis 01/07/2022   Flank pain 34/91/7915   Acute metabolic encephalopathy 05/69/7948   Shortness of breath 01/06/2022   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   Lymphopenia 12/30/2021   SIRS  12/29/2021   Neutropenia (HCC) 12/29/2021   Pancytopenia ([place]) 12/29/2021   Bradycardia 12/29/2021   Hyponatremia 12/29/2021   Hypokalemia 12/29/2021   Physical deconditioning 12/29/2021   Left-sided weakness 10/27/2021   Paroxysmal atrial fibrillation ([place]) 10/27/2021   Hypertension 09/16/2021   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  Admitted to hospital medicine service, being followed up by medical oncology for pancytopenia.  Hospital course also significant for ongoing flank pain and atrial fibrillation and pancytopenia.  Recommendations/Plan: Palliative medicine team initially consulted for goals of care discussions.  Chart reviewed.  Patient seen and examined.  Note that the patient has already been connected with palliative services and is a to have outpatient palliative care through [facility] at home upon discharge.  Recommend continuation of outpatient palliative support.   Code Status:    Code Status Orders  (From admission, onward)           Start     Ordered   01/06/22 1932  Full code  Continuous        01/06/22 1931           Code Status History     Date Active Date Inactive Code Status Order ID Comments User Context   12/26/2021 2351 01/02/2022 2213 Full Code [id number]  Marcelyn Bruins, MD ED   10/27/2021 2330 10/28/2021 1844 Full Code [id number]  Toy Baker, MD ED   09/15/2021 1608 09/19/2021 1929 Full Code [id number]  Vallarie Mare, MD Inpatient   09/08/2021 1459 09/10/2021 1919 Full Code [id number]  Earnie Larsson, MD ED      Advance Directive Documentation    Flowsheet Row Most Recent Value  Type of Advance Directive Healthcare Power of Attorney, Living will  Pre-existing out of facility DNR order (yellow form or pink MOST form) --  "MOST" Form in Place? --       Prognosis:  Guarded   Discharge Planning: Home with Palliative Services  Care plan was discussed with  patient, IDT  Thank you for allowing the Palliative Medicine Team to assist in the care of this patient.   Time In: 1400 Time Out: 1425 Total Time 25 Prolonged Time Billed  no       Greater than 50%  of this time was spent counseling and coordinating care related to the above assessment and plan.  Loistine Chance, MD  Please contact Palliative Medicine Team phone at [phone] for questions and concerns.

## 2022-01-12 NOTE — Plan of Care (Signed)
?  Problem: Clinical Measurements: ?Goal: Will remain free from infection ?Outcome: Progressing ?  ?

## 2022-01-12 NOTE — Progress Notes (Signed)
Physical Therapy Treatment Patient Details Name: Patricia Stevenson MRN: [id number] DOB: 03-05-1961 Today's Date: 01/12/2022   History of Present Illness Patricia Stevenson is a 61 y.o. 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 CT showed New abnormal perirenal stranding on R  ddx includes thrombosis of renal venous tributaries vs recent forniceal rupture vs hematoma along right kidney lower pole pelvis with effacement of venous structures    PT Comments    Pt is a 61 y.o. female with above HPI. Pt is slowly progressing toward acute PT goals with transfer from bed to recliner chair. Pt continues to require +2 assist for safety and performance of all mobility, MOD A+2 for step pivot with limited foot clearance to chair. Per pt and friend pt has not been ambulating prior to admission. Has been using wheelchair and near total assist for mobility. Pt will benefit from continued skilled PT to increase their independence and maximize safety with mobility.     Recommendations for follow up therapy are one component of a multi-disciplinary discharge planning process, led by the attending physician.  Recommendations may be updated based on patient status, additional functional criteria and insurance authorization.  Follow Up Recommendations  Home health PT     Assistance Recommended at Discharge Frequent or constant Supervision/Assistance  Patient can return home with the following Two people to help with walking and/or transfers;Two people to help with bathing/dressing/bathroom;Assist for transportation;Help with stairs or ramp for entrance;Assistance with cooking/housework   Equipment Recommendations  None recommended by PT    Recommendations for Other Services       Precautions / Restrictions Precautions Precautions: Fall Precaution Comments: incontinent, L side  weakness/inattention Restrictions Weight Bearing Restrictions: No     Mobility  Bed Mobility Overal bed mobility: Needs Assistance Bed Mobility: Supine to Sit     Supine to sit: Max assist, +2 for physical assistance, +2 for safety/equipment, HOB elevated     General bed mobility comments: Assist for trunk and bil LEs. Assist with bedpad for scooting, positioning. Increased time. Multimodal cuing provided. Pt fatigues very quickly.    Transfers Overall transfer level: Needs assistance Equipment used: Rolling walker (2 wheels), None Transfers: Sit to/from Stand, Bed to chair/wheelchair/BSC Sit to Stand: Mod assist, +2 physical assistance, +2 safety/equipment           General transfer comment: Pt able to stand with MOD A+2 and cues for hand placement. Able to take small steps/slide feet laterally with cues for sequencing to chair and +2 assist. Pt required +2 assist to reposition in chair as pt reporting increased fatigue and unable to scoot hips backward with use of UEs.    Ambulation/Gait                   Stairs             Wheelchair Mobility    Modified Rankin (Stroke Patients Only)       Balance Overall balance assessment: Needs assistance Sitting-balance support: Feet supported, Single extremity supported Sitting balance-Leahy Scale: Poor Sitting balance - Comments: required at least one upper extremity support   Standing balance support: Bilateral upper extremity supported, During functional activity, Reliant on assistive device for balance Standing balance-Leahy Scale: Zero  Cognition Arousal/Alertness: Awake/alert Behavior During Therapy: Flat affect                                   General Comments: Pt responding to therapist in [place] at times and nodding head in understanding. Pt friend present throughout interpreting and communicating information about PLOF to therapist.         Exercises      General Comments        Pertinent Vitals/Pain Pain Assessment Pain Assessment: No/denies pain    Home Living                          Prior Function            PT Goals (current goals can now be found in the care plan section) Acute Rehab PT Goals Patient Stated Goal: to stand and walk again PT Goal Formulation: With patient/family Time For Goal Achievement: 01/25/22 Potential to Achieve Goals: Fair Progress towards PT goals: Progressing toward goals    Frequency    Min 3X/week      PT Plan Current plan remains appropriate    Co-evaluation              AM-PAC PT "6 Clicks" Mobility   Outcome Measure  Help needed turning from your back to your side while in a flat bed without using bedrails?: A Lot Help needed moving from lying on your back to sitting on the side of a flat bed without using bedrails?: A Lot Help needed moving to and from a bed to a chair (including a wheelchair)?: Total Help needed standing up from a chair using your arms (e.g., wheelchair or bedside chair)?: Total Help needed to walk in hospital room?: Total Help needed climbing 3-5 steps with a railing? : Total 6 Click Score: 8    End of Session Equipment Utilized During Treatment: Gait belt Activity Tolerance: Patient tolerated treatment well Patient left: in chair;with call bell/phone within reach;with chair alarm set;with family/visitor present Nurse Communication: Mobility status PT Visit Diagnosis: History of falling (Z91.81);Muscle weakness (generalized) (M62.81);Difficulty in walking, not elsewhere classified (R26.2);Adult, failure to thrive (R62.7);Other abnormalities of gait and mobility (R26.89)     Time: 4010-2725 PT Time Calculation (min) (ACUTE ONLY): 17 min  Charges:  $Therapeutic Activity: 8-22 mins                     Festus Barren PT, DPT  Acute Rehabilitation Services  Office [phone] 01/12/2022, 2:09 PM

## 2022-01-12 NOTE — Progress Notes (Addendum)
HEMATOLOGY-ONCOLOGY PROGRESS NOTE  ASSESSMENT AND PLAN: 61 year old lady with recently diagnosed high-grade glioma, status post surgery in January 2023, finished concurrent chemoradiation on April 21st 2023.    Flank pain Pancytopenia High-grade glioma, finished concurrent chemoradiation on April 21, last dose Temodar held since December 08, 2021 due to thrombocytopenia. Atrial fibrillation   Recommendations: -The patient has persistent cytopenias.  Will discuss with husband later today but could consider a bone marrow biopsy for further evaluation.  However, she is not a candidate for systemic treatment if she is found to have an underlying bone marrow disorder. -Would recommend supportive care with transfusions and Granix as needed. -She has been receiving Granix and transfusions intermittently and it appears that she will require supportive care about once a week.  We can arrange for outpatient follow-up in our office for lab work and supportive transfusions as needed. -Continue ongoing goals of care discussion with the patient and family.  Mikey Bussing, NP   SUBJECTIVE: Laying in bed, alert, able to speak a few words.  Family friend at the bedside who indicates that her husband will be here later today after around 3 PM.  No bleeding reported.  Oncology History  High grade glioma not classifiable by WHO criteria ([place])  09/15/2021 Initial Diagnosis   High grade glioma not classifiable by WHO criteria ([place])   09/15/2021 Cancer Staging   Staging form: Brain and Spinal Cord, AJCC Version 9 - Clinical stage from 09/15/2021: No stage assigned - Signed by Ventura Sellers, MD on 10/08/2021 Histopathologic type: Astrocytoma, NOS Stage prefix: Initial diagnosis   10/19/2021 -  Chemotherapy   Patient is on Treatment Plan : BRAIN GLIOBLASTOMA Radiation Therapy With Concurrent Temozolomide 75 mg/m2 Daily Followed By Sequential Maintenance Temozolomide x 6-12 cycles        REVIEW OF SYSTEMS:    Review of Systems  Reason unable to perform ROS: Limited review of systems obtained.  Constitutional:  Negative for fever.  Respiratory:  Negative for cough and shortness of breath.   Cardiovascular:  Negative for chest pain.  Gastrointestinal:  Negative for abdominal pain, nausea and vomiting.  Endo/Heme/Allergies:  Does not bruise/bleed easily.   I have reviewed the past medical history, past surgical history, social history and family history with the patient and they are unchanged from previous note.   PHYSICAL EXAMINATION: ECOG PERFORMANCE STATUS: 4 - Bedbound  Vitals:   01/11/22 1946 01/12/22 0445  BP: 106/70 132/72  Pulse: 75 61  Resp: 20 18  Temp: 97.9 F (36.6 C) 98 F (36.7 C)  SpO2: 96% 96%   Filed Weights   01/08/22 0428 01/10/22 0500 01/12/22 0445  Weight: 57 kg 52.5 kg 55 kg    Intake/Output from previous day: 05/22 0701 - 05/23 0700 In: 1955.6 [P.O.:120; I.V.:1835.6] Out: 4500 [Urine:4500]  Physical Exam Vitals reviewed.  Constitutional:      Appearance: She is ill-appearing.  Skin:    General: Skin is warm and dry.     Comments: Scattered petechiae and ecchymoses.  Neurological:     Comments: Follows commands.    LABORATORY DATA:  I have reviewed the data as listed    Latest Ref Rng & Units 01/12/2022    4:59 AM 01/11/2022    4:39 AM 01/10/2022    5:22 AM  CMP  Glucose 70 - 99 mg/dL 157   155   143    BUN 6 - 20 mg/dL _0 Creatinine 0.44 -  1.00 mg/dL <0.30   <0.30   0.31    Sodium 135 - 145 mmol/L 135   136   135    Potassium 3.5 - 5.1 mmol/L 3.5   3.8   3.8    Chloride 98 - 111 mmol/L 107   107   104    CO2 22 - 32 mmol/L _0 Calcium 8.9 - 10.3 mg/dL 7.9   8.0   8.5    Total Protein 6.5 - 8.1 g/dL 4.5   4.9   5.0    Total Bilirubin 0.3 - 1.2 mg/dL 0.5   0.3   0.4    Alkaline Phos 38 - 126 U/L 45   47   48    AST 15 - 41 U/L _1 ALT 0 - 44 U/L 59   68   58      Lab Results  Component Value Date    WBC 1.2 (LL) 01/12/2022   HGB 8.9 (L) 01/12/2022   HCT 25.2 (L) 01/12/2022   MCV 94.7 01/12/2022   PLT 30 (L) 01/12/2022   NEUTROABS 0.8 (L) 01/12/2022    No results found for: CEA1, CEA, AGT364, CA125, PSA1  CT HEAD WO CONTRAST (5MM)  Result Date: 01/07/2022 CLINICAL DATA:  Mental status change, unknown cause EXAM: CT HEAD WITHOUT CONTRAST TECHNIQUE: Contiguous axial images were obtained from the base of the skull through the vertex without intravenous contrast. RADIATION DOSE REDUCTION: This exam was performed according to the departmental dose-optimization program which includes automated exposure control, adjustment of the mA and/or kV according to patient size and/or use of iterative reconstruction technique. COMPARISON:  12/26/2021 CT head, 12/27/2021 MRI head FINDINGS: Brain: Redemonstrated right temporal lobe resection cavity and encephalomalacia. Redemonstrated mildly hyperdense tumor in the region of the right temporal lobe, cerebral peduncle, midbrain, and pons, which appears overall unchanged from the prior exam; more punctate focus of increased density in the right pons (series 2, image 8), which may represent a small punctate focus of hemorrhage. No definite acute infarct, mass effect, or midline shift. No hydrocephalus or extra-axial collection. Periventricular white matter changes, likely the sequela of chronic small vessel ischemic disease. Vascular: No hyperdense vessel. Skull: Right frontotemporal craniotomy. No acute fracture or suspicious osseous lesion. Sinuses/Orbits: No acute finding. Other: The mastoids are well aerated. IMPRESSION: Punctate focus of increased density in the right pons, which may represent a small punctate focus of hemorrhage within the previously noted tumor. Otherwise unchanged tumor in the right temporal lobe, cerebral peduncle, midbrain, and pons. Electronically Signed   By: Merilyn Baba M.D.   On: 01/07/2022 11:19   CT Head Wo Contrast  Result Date:  12/26/2021 CLINICAL DATA:  Altered mental status EXAM: CT HEAD WITHOUT CONTRAST TECHNIQUE: Contiguous axial images were obtained from the base of the skull through the vertex without intravenous contrast. RADIATION DOSE REDUCTION: This exam was performed according to the departmental dose-optimization program which includes automated exposure control, adjustment of the mA and/or kV according to patient size and/or use of iterative reconstruction technique. COMPARISON:  None Available. FINDINGS: Brain: Right temporal lobe encephalomalacia at site of prior tumor resection. There is hyperdensity near the right cerebral peduncle corresponding to previously demonstrated area of tumor. No acute hemorrhage or extra-axial collection. Generalized volume loss with periventricular white matter changes. Vascular: No abnormal hyperdensity of the major intracranial arteries or dural venous sinuses. No intracranial atherosclerosis.  Skull: Remote right craniotomy. Sinuses/Orbits: No fluid levels or advanced mucosal thickening of the visualized paranasal sinuses. No mastoid or middle ear effusion. The orbits are normal. IMPRESSION: 1. No acute intracranial abnormality. 2. Right temporal lobe encephalomalacia at site of prior tumor resection. Electronically Signed   By: Ulyses Jarred M.D.   On: 12/26/2021 23:07   CT Angio Chest PE W and/or Wo Contrast  Result Date: 12/16/2021 CLINICAL DATA:  Shortness of breath and left lower extremity edema. Brain tumor. EXAM: CT ANGIOGRAPHY CHEST WITH CONTRAST TECHNIQUE: Multidetector CT imaging of the chest was performed using the standard protocol during bolus administration of intravenous contrast. Multiplanar CT image reconstructions and MIPs were obtained to evaluate the vascular anatomy. RADIATION DOSE REDUCTION: This exam was performed according to the departmental dose-optimization program which includes automated exposure control, adjustment of the mA and/or kV according to patient size  and/or use of iterative reconstruction technique. CONTRAST:  57m OMNIPAQUE IOHEXOL 350 MG/ML SOLN COMPARISON:  None. FINDINGS: Cardiovascular: Negative for pulmonary embolus. Atherosclerotic calcification of the aorta. Enlarged pulmonic trunk. Heart is at the upper limits of normal in size. No pericardial effusion. Mediastinum/Nodes: No pathologically enlarged mediastinal, hilar or axillary lymph nodes. Esophagus is grossly unremarkable. Lungs/Pleura: Ground-glass is seen in both lower lobes and may be due to slight expiratory phase imaging. No dense airspace consolidation. No pleural fluid. Airway is unremarkable. Upper Abdomen: Visualized portions of the liver, adrenal glands, kidneys, spleen, pancreas, stomach and bowel are grossly unremarkable. Musculoskeletal: Degenerative changes in the spine. No worrisome lytic or sclerotic lesions. Review of the MIP images confirms the above findings. IMPRESSION: 1. Negative for pulmonary embolus. 2. No definitive findings to explain the patient's clinical history. 3.  Aortic atherosclerosis (ICD10-I70.0). 4. Enlarged pulmonic trunk, indicative of pulmonary arterial hypertension. Electronically Signed   By: MLorin PicketM.D.   On: 12/16/2021 16:10   MR BRAIN W WO CONTRAST  Result Date: 12/27/2021 CLINICAL DATA:  61year old female with high-grade glioma status post subtotal resection. Altered mental status. EXAM: MRI HEAD WITHOUT AND WITH CONTRAST TECHNIQUE: Multiplanar, multiecho pulse sequences of the brain and surrounding structures were obtained without and with intravenous contrast. CONTRAST:  568mGADAVIST GADOBUTROL 1 MMOL/ML IV SOLN COMPARISON:  Head CT yesterday. Restaging brain MRI 10/27/2021 and earlier. FINDINGS: Brain: Rounded, masslike enhancement at the right midbrain and cerebral peduncle has regressed and is now indistinct on series 16, image 57. However, nearby Patchy and confluent enhancement at the right optic radiations appears increased and is not  obviously vascular related (series 16, image 58 and also postcontrast coronal series 19, image 16). Regional temporal lobe resection cavity has not significantly changed in size or configuration. Stable associated hemosiderin. And confluent regional T2 and FLAIR hyperintensity is stable to mildly improved. However, there is new discontinuous T2/FLAIR abnormality in the posteromedial right thalamus on series 11, image 25. And ongoing discontinuous although improved T2 hyperintensity in the right brainstem at the pons (series 8, image 7). Additionally, confluent right corona radiata T2 and FLAIR hyperintensity has progressed since March but is facilitated on diffusion (series 11, image 36), nonenhancing. On DWI heterogeneity in the tumor region and right brainstem is not significantly changed. Post craniotomy dural thickening along the right middle cranial fossa is stable. No midline shift. Stable ventricle size and configuration. No restricted diffusion suggestive of acute infarction. No acute intracranial hemorrhage. Cervicomedullary junction and pituitary are within normal limits. Stable gray and white matter signal elsewhere. Vascular: Major intracranial vascular flow voids are stable.  The major dural venous sinuses are enhancing and appear to be patent. Skull and upper cervical spine: Negative visible cervical spine and spinal cord. Stable bone marrow signal. Sinuses/Orbits: Stable and negative. Other: Visible internal auditory structures appear normal. Stable scalp and face. IMPRESSION: 1. Mixed imaging response to therapy since March: Regressed enhancement in the right midbrain, but new petechial enhancement in the right optic radiations. New discontinuous T2/FLAIR hyperintensity in both the medial right thalamus and right corona radiata. Otherwise essentially stable appearance of infiltrating tumor in the right hemisphere and brainstem. 2. No new intracranial abnormality. Electronically Signed   By: Genevie Ann  M.D.   On: 12/27/2021 09:09   CT ABDOMEN PELVIS W CONTRAST  Result Date: 01/06/2022 CLINICAL DATA:  Abdominal pain, especially in the right upper quadrant, over the last 2 days. High-grade glioma, completed radiation therapy 1 month ago. EXAM: CT ABDOMEN AND PELVIS WITH CONTRAST TECHNIQUE: Multidetector CT imaging of the abdomen and pelvis was performed using the standard protocol following bolus administration of intravenous contrast. RADIATION DOSE REDUCTION: This exam was performed according to the departmental dose-optimization program which includes automated exposure control, adjustment of the mA and/or kV according to patient size and/or use of iterative reconstruction technique. CONTRAST:  164m OMNIPAQUE IOHEXOL 300 MG/ML  SOLN COMPARISON:  12/26/2021 FINDINGS: Despite efforts by the technologist and patient, motion artifact is present on today's exam and could not be eliminated. This reduces exam sensitivity and specificity. Lower chest: Dependent subsegmental atelectasis or scarring in both lower lobes, similar to the 12/26/2021 exam. Hepatobiliary: Cholecystectomy.  Otherwise unremarkable. Pancreas: Unremarkable Spleen: Unremarkable Adrenals/Urinary Tract: New abnormal right perirenal fluid. 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 Accentuated corticomedullary differentiation in the right kidney lower pole compared to the left kidney and the right kidney upper pole, suspicious for mildly delayed right kidney lower pole nephrogram. Reduced visualization of right kidney lower pole venous tributaries compared to the 12/26/2021 exam, cannot exclude thrombosis of these tributaries although the motion artifact on portal venous and delayed phase images causes some diagnostic uncertainty. On delayed phase images, parenchymal enhancement in the right kidney lower pole matches the other 2 kidneys and there is some excretion contrast from  the lower pole moiety. Small bilateral hypodense lesions are technically too small to characterize although statistically likely to be benign. Urinary bladder unremarkable. Stomach/Bowel: Normal appendix. Prominent stool throughout the colon favors constipation. Vascular/Lymphatic: As noted in the urinary tract section above, there is some reduce conspicuity of venous structures to the right kidney lower pole raising the possibility of thrombosis of venous tributaries from the right kidney lower pole. Otherwise unremarkable. Gonadal veins patent. Reproductive: Unremarkable Other: No supplemental non-categorized findings. Musculoskeletal: Right eccentric disc bulge at L5-S1. IMPRESSION: 1. At least partially duplicated right renal collecting system, with new abnormal perirenal stranding on the right indicating the right kidney is the likely cause for the patient's abdominal pain. There is some subtle additional findings including slightly earlier contrast phase in the right kidney lower pole (with greater corticomedullary differentiation) and reduced conspicuity of right renal vein structures in the lower pole compared to the upper pole and left kidney. There is also increased fullness of the right kidney lower pole structures on delayed images compared to 12/26/2021. Motion artifact complicates the assessment. The patient has known substantial thrombocytopenia and hematuria today. Possibilities given this appearance and scenario may include thrombosis of renal venous tributaries from the right kidney lower pole moiety; recent  forniceal rupture of the right kidney lower pole with some resulting effacement of venous structures; or hematoma along the right kidney lower pole pelvis with effacement of venous structures. Doppler renal vascular sonography with attention to the lower pole of the right kidney may provide helpful complementary information. 2. Bibasilar scarring or subsegmental atelectasis. 3.  Prominent stool  throughout the colon favors constipation. 4. Disc bulge at L5-S1. These results were called by telephone at the time of interpretation on 01/06/2022 at 2:55 pm to provider Dr. Dorie Rank , who verbally acknowledged these results. Electronically Signed   By: Van Clines M.D.   On: 01/06/2022 15:02   CT Abdomen Pelvis W Contrast  Result Date: 12/26/2021 CLINICAL DATA:  Abdominal pain. EXAM: CT ABDOMEN AND PELVIS WITH CONTRAST TECHNIQUE: Multidetector CT imaging of the abdomen and pelvis was performed using the standard protocol following bolus administration of intravenous contrast. RADIATION DOSE REDUCTION: This exam was performed according to the departmental dose-optimization program which includes automated exposure control, adjustment of the mA and/or kV according to patient size and/or use of iterative reconstruction technique. CONTRAST:  172m OMNIPAQUE IOHEXOL 300 MG/ML  SOLN COMPARISON:  CT abdomen pelvis dated 06/10/2021. FINDINGS: Lower chest: Bibasilar linear atelectasis. No intra-abdominal free air or free fluid. Hepatobiliary: The liver is unremarkable. No intrahepatic biliary dilatation. Cholecystectomy. Pancreas: Unremarkable. No pancreatic ductal dilatation or surrounding inflammatory changes. Spleen: Normal in size without focal abnormality. Adrenals/Urinary Tract: The adrenal glands unremarkable the kidneys, visualized ureters, and urinary bladder appear unremarkable. Stomach/Bowel: There is moderate stool throughout the colon. There is no bowel obstruction or active inflammation. The appendix is normal. Vascular/Lymphatic: The abdominal aorta and IVC are unremarkable. No portal venous gas. There is no adenopathy. Reproductive: The uterus and ovaries are grossly unremarkable. No pelvic mass. Other: None Musculoskeletal: No acute or significant osseous findings. IMPRESSION: 1. No acute intra-abdominal or pelvic pathology. 2. Moderate colonic stool burden. No bowel obstruction. Normal  appendix. Electronically Signed   By: AAnner CreteM.D.   On: 12/26/2021 23:22   DG CHEST PORT 1 VIEW  Result Date: 01/06/2022 CLINICAL DATA:  [place]. EXAM: PORTABLE CHEST 1 VIEW COMPARISON:  Dec 27, 2021. FINDINGS: The heart size and mediastinal contours are within normal limits. Both lungs are clear. The visualized skeletal structures are unremarkable. IMPRESSION: No active disease. Electronically Signed   By: JMarijo ConceptionM.D.   On: 01/06/2022 17:41   Portable chest 1 View  Result Date: 12/27/2021 CLINICAL DATA:  Generalized weakness. EXAM: PORTABLE CHEST 1 VIEW COMPARISON:  Chest radiograph dated 12/16/2021 and CT dated 12/16/2021 FINDINGS: Minimal left lung base atelectasis. No focal consolidation, pleural effusion, pneumothorax. Stable cardiac silhouette. No acute osseous pathology. IMPRESSION: No active disease. Electronically Signed   By: AAnner CreteM.D.   On: 12/27/2021 00:47   DG Chest Port 1 View  Result Date: 12/16/2021 CLINICAL DATA:  Shortness of breath EXAM: PORTABLE CHEST 1 VIEW COMPARISON:  None. FINDINGS: The heart size and mediastinal contours are within normal limits. Both lungs are clear. The visualized skeletal structures are unremarkable. IMPRESSION: No acute abnormality of the lungs in AP portable projection. Electronically Signed   By: ADelanna AhmadiM.D.   On: 12/16/2021 15:26   VAS [place]OWER EXTREMITY VENOUS (DVT) (ONLY [facility] & [facility])  Result Date: 12/16/2021  Lower Venous DVT Study Patient Name:  MGABRIAL POPPELL Date of Exam:   12/16/2021 Medical Rec #: [id number]    Accession #:    [id number]  of Birth: 1960/09/01    Patient Gender: F Patient Age:   70 years Exam Location:  [facility] Procedure:      VAS [place] LOWER EXTREMITY VENOUS (DVT) Referring Phys: JOSEPH ZAMMIT --------------------------------------------------------------------------------  Indications: SOB, and LLE edema.  Risk Factors: Chemotherapy. Comparison Study: No previous exams Performing  Technologist: Jody Hill RVT, RDMS  Examination Guidelines: A complete evaluation includes B-mode imaging, spectral Doppler, color Doppler, and power Doppler as needed of all accessible portions of each vessel. Bilateral testing is considered an integral part of a complete examination. Limited examinations for reoccurring indications may be performed as noted. The reflux portion of the exam is performed with the patient in reverse Trendelenburg.  +---------+---------------+---------+-----------+----------+--------------+ RIGHT    CompressibilityPhasicitySpontaneityPropertiesThrombus Aging +---------+---------------+---------+-----------+----------+--------------+ CFV      Full           Yes      Yes                                 +---------+---------------+---------+-----------+----------+--------------+ SFJ      Full                                                        +---------+---------------+---------+-----------+----------+--------------+ FV Prox  Full           Yes      Yes                                 +---------+---------------+---------+-----------+----------+--------------+ FV Mid   Full           Yes      Yes                                 +---------+---------------+---------+-----------+----------+--------------+ FV DistalFull           Yes      Yes                                 +---------+---------------+---------+-----------+----------+--------------+ PFV      Full                                                        +---------+---------------+---------+-----------+----------+--------------+ POP      Full           Yes      Yes                                 +---------+---------------+---------+-----------+----------+--------------+ PTV      Full                                                        +---------+---------------+---------+-----------+----------+--------------+ PERO     Full                                                         +---------+---------------+---------+-----------+----------+--------------+   +---------+---------------+---------+-----------+----------+--------------+  LEFT     CompressibilityPhasicitySpontaneityPropertiesThrombus Aging +---------+---------------+---------+-----------+----------+--------------+ CFV      Full           Yes      Yes                                 +---------+---------------+---------+-----------+----------+--------------+ SFJ      Full                                                        +---------+---------------+---------+-----------+----------+--------------+ FV Prox  Full           Yes      Yes                                 +---------+---------------+---------+-----------+----------+--------------+ FV Mid   Full           Yes      Yes                                 +---------+---------------+---------+-----------+----------+--------------+ FV DistalFull           Yes      Yes                                 +---------+---------------+---------+-----------+----------+--------------+ PFV      Full                                                        +---------+---------------+---------+-----------+----------+--------------+ POP      Full           Yes      Yes                                 +---------+---------------+---------+-----------+----------+--------------+ PTV      Full                                                        +---------+---------------+---------+-----------+----------+--------------+ PERO     Full                                                        +---------+---------------+---------+-----------+----------+--------------+     Summary: BILATERAL: - No evidence of deep vein thrombosis seen in the lower extremities, bilaterally. - No evidence of superficial venous thrombosis in the lower extremities, bilaterally. -No evidence of popliteal cyst, bilaterally.  LEFT: -  subcutaneous edema seen in area of popliteal fossa and calf.  *See table(s) above for measurements and observations. Electronically signed by Harold Barban MD on  12/16/2021 at 8:14:16 PM.    Final    VAS US RENAL ARTERY DUPLEX  Result Date: 01/06/2022 ABDOMINAL VISCERAL Patient Name:  KALA AMBRIZ  Date of Exam:   01/06/2022 Medical Rec #: [id number]     Accession #:    [id number] Date of Birth: March 31, 1961    Patient Gender: F Patient Age:   89 years Exam Location:  [facility] Procedure:      VAS US RENAL ARTERY DUPLEX Referring Phys: [id number] JON KNAPP -------------------------------------------------------------------------------- Indications: Suspicion for renal vein thrombosis on CT Limitations: Air/bowel gas, obesity and patient immobility. Comparison Study: 01-06-2022 CT abdomen pelvis with contrast Performing Technologist: Rogelia Rohrer RVT, RDMS  Examination Guidelines: A complete evaluation includes B-mode imaging, spectral Doppler, color Doppler, and power Doppler as needed of all accessible portions of each vessel. Bilateral testing is considered an integral part of a complete examination. Limited examinations for reoccurring indications may be performed as noted.  Duplex Findings:  Summary: Renal:  Bilateral: IVC and renal veins are patent bilaterally. Right renal            vein patent prox, mid, distal, and at hilum, Left renal            vein patent distal and at hilum. Limited visualization at            prox and mid segments.  *See table(s) above for measurements and observations.  Diagnosing physician: Jamelle Haring  Electronically signed by Jamelle Haring on 01/06/2022 at 9:28:42 PM.    Final      Future Appointments  Date Time Provider [place]  01/26/2022  8:00 AM [facility] None      LOS: 6 days   Addendum  I have seen the patient, examined her. I agree with the assessment and and plan and have edited the notes.   Pt is clinically stable, although not  much improvement.  She remains to be pancytopenic, due to her overall very poor prognosis from GBM.,  I will hold on bone marrow biopsy because she is not a candidate for any treatment except supportive care. She is still intermittently confused.  I spoke with her nurse, her sister-in-law at the bedside, and her husband on the phone around 4:30 today.  I tried multiple times to meet her husband in person, but he was not there.  I reviewed the lab results with him, her platelet count has been slowly drifting down, patient is currently full code, has not accepted hospice, then I will prophylactically give her plt transfusion if plt<20K. However she is physically not able to come to the office, which makes transfusion arrangement difficult. If she will be discharged home tomorrow, please consider 1 unit platelet transfusion before discharge.  After the conversation, patient and her husband are in agreement with discharge.   Truitt Merle  01/12/2022

## 2022-01-12 NOTE — Progress Notes (Signed)
Date and time results received: 01/12/22 0609 (use smartphrase ".now" to insert current time)  Test: CBC Critical Value: WBC-1.2  Name of Provider Notified: Olena Heckle, NP  Received from:[facility]   Collected: 01/12/22 at 0500.  Awaiting orders.

## 2022-01-12 NOTE — Progress Notes (Signed)
PROGRESS NOTE    DAESIA ZYLKA  ZOX:[id number] DOB: 1961-07-30 DOA: 01/06/2022 PCP: Isaac Bliss, Rayford Halsted, MD  Chief Complaint  Patient presents with   Abdominal Pain    Brief Narrative:  Patricia Stevenson is Patricia Stevenson 61 y.o. 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   She's had 2 days of R sided flank pain, woke up last night screaming in pain.  Lasted minutes, then recurred.  Recurred Chikita Dogan 3rd time around 10 am this morning.  Denies trauma, fevers, change in urinary habits.  She's been unable to walk for about 2 weeks.  She's been more confused for 3-4 weeks.  She was recently discharged after an admission for sepsis/neutropenic fever without clear infectious source identified.  She was treated with broad spectrum abx which were ultimately discontinued.  She was given granix x1 on 5/10.  She was also transfused with platelets for thrombocytopenia.   She's here today again due to pain.  Of note, Dr. Mickeal Skinner had discussion with the patient/family regarding her recent decline and they'd began to discuss goals of care.  She was admitted with flank pain, CT scan showed perirenal stranding on the right as well as other abnormal findings concerning for thrombosis of renal venous tributaries, recent forniceal rupture, or hematoma along the right kidney lower pole pelvis with effacement of the venous structures.  She was found to have Navon Kotowski UTI and treated with cefepime.  She was discharged on bactrim.  He abdominal discomfort had improved by the time of discharge.  She was seen by Dr. Mickeal Skinner who discussed that she had limited options and no treatments remaining that could provide mortality benefit, palliative care was consulted and has been following.  Plan for palliative to follow outpatient.  Discharge 5/22 canceled after discussion with husband, concerned about pancytopenia.  Similar  concern today, discussed with oncology, plan for 1 unit platelets.  Hopefully can d/c 5/24 am.  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  Of note, she's not Patricia Stevenson candidate for anticoaguation with thrombocytopenia.  Also, discussion with urology notes another possible dx would be renal stone (limited eval with contrast). Renal artery [place] with patent IVC and renal veins bilaterally, right renal vein patent prox mid, distal and at hiluim.  Left renal vein patent distal and at hilum, limited visualization at prox and mid segments. UA with RBC's, negative leukocyte and nitrite -> 60,000 e. Coli, 10,000 enterobacter aerogenes - both sensitive to cefepime - will discuss with ID Cefepime 5/18 - 5/22.  Had planned to discharge with additional 2 days bactrim for 7 days total on 5/22, prescription sent - current on bactrim inpatient. S/p 2 units platelets 5/17 given possible hematoma  Discussed with urology, not need  for any acute intervention from their standpoint Follow symptomatically, consider repeat imaging - with improvement will hold off on repeating imaging. Pain management  Acute pyelonephritis abx as above  Pancytopenia (HCC) Given abnormal renal imaging, concern for possible hematoma S/p platelets x2 units 5/17 She has scattered petechiae and purpura   WBC count/ANC and platelets dropping again - will transfuse 1 unit platelets today after discussion with oncology Oncology notes they can follow outpatient    High grade glioma not classifiable by WHO criteria [facility]) S/p surgical resection and chemoradiation with temodar Currently temodar on hold per recent d/c summary Continue keppra and decadron Dr. Mickeal Skinner planning on discussion regarding goals of care, he'd broached this discussion 5/16 with family.   Appreciate Dr. Renda Rolls assistance -> open to visiting with palliative care, will consult at this time.  Continued palliative care discussions outpatient.   Shortness of breath CXR without acute abnormalities Currently on RA, hold additional w/u right now given stability and plan for goc discussions with neuro oncology Continue to follow - no additional complaints today - seems resolved  Acute metabolic encephalopathy Delirium precautions MRI 5/7 with mixed imaging response to therapy since march -> regressed enhancement in R midbrain, but new petichial enhancement in R optic radiations, new discontinuous T2/Flair hyperintensity in both medial R thalamus and R corona radiata Head CT with punctate focus of increased density in R pons (may represent small punctate focus of hemorrhage within previously noted tumor) - neuro oncology aware Overall mental status seems improved with abx, stable for discharge  Paroxysmal atrial fibrillation (HCC) Sinus brady No anticoagulation with thrombocytopenia  Flat affect Related to encephalopathy vs mood/depression Will monitor  Physical deconditioning Consider therapy eval based on discussion with neuro oncology Will try to get home health PT/OT at home if able  Hypokalemia Replace and follow  Hyponatremia resolved  Bradycardia noted       DVT prophylaxis: SCD Code Status: full Family Communication: husband, friend at bedside Disposition:   Status is: Inpatient Remains  inpatient appropriate because: stable after discussion with oncology, husband does not want to discharge yet due to falling counts, transfusing additional unit of platelets   Consultants:  Oncology Neuro oncology  Procedures:  none  Antimicrobials:  Anti-infectives (From admission, onward)    Start     Dose/Rate Route Frequency Ordered Stop   01/11/22 2200  sulfamethoxazole-trimethoprim (BACTRIM DS) 800-160 MG per tablet 1 tablet        1 tablet Oral Every 12 hours 01/11/22 1431 01/14/22 0959   01/11/22 0000  sulfamethoxazole-trimethoprim (BACTRIM DS) 800-160 MG tablet        1 tablet Oral Every 12 hours 01/11/22 1448 01/13/22 2359   01/07/22 2300  ceFEPIme (MAXIPIME) 2 g in sodium chloride 0.9 % 100 mL IVPB  Status:  Discontinued        2 g 200 mL/hr over 30 Minutes Intravenous Every 12 hours 01/07/22 1158 01/11/22 1431   01/07/22 1245  ceFEPIme (MAXIPIME) 2 g in sodium chloride 0.9 % 100 mL IVPB        2 g 200 mL/hr over 30 Minutes Intravenous NOW 01/07/22 1157 01/07/22 1308       Subjective: Wants to go home, but husband wants her to stay for now  Objective: Vitals:   01/11/22 1836 01/11/22 1946 01/12/22 0445 01/12/22 1805  BP: 130/78 106/70 132/72 128/70  Pulse: 66 75 61 62  Resp: '17 20 18 18  '$ Temp: 98.2 F (36.8 C) 97.9 F (36.6 C) 98  F (36.7 C) 98.2 F (36.8 C)  TempSrc: Oral Oral Oral Oral  SpO2: 97% 96% 96% 97%  Weight:   55 kg   Height:        Intake/Output Summary (Last 24 hours) at 01/12/2022 1916 Last data filed at 01/12/2022 1337 Gross per 24 hour  Intake 240 ml  Output 2500 ml  Net -2260 ml   Filed Weights   01/08/22 0428 01/10/22 0500 01/12/22 0445  Weight: 57 kg 52.5 kg 55 kg    Examination:  General exam: Appears calm and comfortable  Respiratory system: unlabored Cardiovascular system: RRR Gastrointestinal system: Abdomen is nondistended, soft and nontender.  Central nervous system: Alert and oriented. No focal neurological  deficits. Extremities: no LEE    Data Reviewed: I have personally reviewed following labs and imaging studies  CBC: Recent Labs  Lab 01/08/22 0531 01/09/22 0606 01/10/22 0522 01/11/22 0439 01/12/22 0459  WBC 2.4* 2.6* 2.3* 1.9* 1.2*  NEUTROABS 2.2 2.0 1.6* 1.5* 0.8*  HGB 10.4* 9.7* 9.5* 9.1* 8.9*  HCT 30.1* 27.1* 26.8* 26.3* 25.2*  MCV 95.3 94.1 93.4 94.6 94.7  PLT 99* 72* 54* 37* 30*    Basic Metabolic Panel: Recent Labs  Lab 01/07/22 0558 01/08/22 0531 01/09/22 0606 01/10/22 0522 01/11/22 0439 01/12/22 0459  NA 135 135 133* 135 136 135  K 2.9* 4.1 3.8 3.8 3.8 3.5  CL 102 106 104 104 107 107  CO2 25 21* '23 24 23 23  '$ GLUCOSE 80 120* 128* 143* 155* 157*  BUN '19 19 19 '$ 22* 15 14  CREATININE 0.33* 0.33* 0.33* 0.31* <0.30* <0.30*  CALCIUM 8.1* 8.4* 8.3* 8.5* 8.0* 7.9*  MG 2.0  --  2.1 2.2 2.0 2.0  PHOS  --   --  2.7 3.0 2.3* 2.9    GFR: CrCl cannot be calculated (This lab value cannot be used to calculate CrCl because it is not Ericson Nafziger number: <0.30).  Liver Function Tests: Recent Labs  Lab 01/07/22 0558 01/09/22 0606 01/10/22 0522 01/11/22 0439 01/12/22 0459  AST '22 16 18 21 19  '$ ALT 55* 56* 58* 68* 59*  ALKPHOS 42 44 48 47 45  BILITOT 0.8  0.9 0.6 0.4 0.3 0.5  PROT 5.0* 5.0* 5.0* 4.9* 4.5*  ALBUMIN 2.7* 2.6* 2.7* 2.6* 2.4*    CBG: No results for input(s): GLUCAP in the last 168 hours.   Recent Results (from the past 240 hour(s))  Urine Culture     Status: Abnormal   Collection Time: 01/06/22  3:51 PM   Specimen: Urine, Clean Catch  Result Value Ref Range Status   Specimen Description   Final    URINE, CLEAN CATCH Performed at [facility], [place] [address] [place], [place] [address]    Special Requests   Final    NONE Performed at [facility], [place] [address], [place], [place] [address]    Culture (Lory Nowaczyk)  Final    60,000 COLONIES/mL ESCHERICHIA COLI 10,000 COLONIES/mL ENTEROBACTER AEROGENES    Report Status  01/09/2022 FINAL  Final   Organism ID, Bacteria ESCHERICHIA COLI (Onda Kattner)  Final   Organism ID, Bacteria ENTEROBACTER AEROGENES (Payam Gribble)  Final      Susceptibility   Enterobacter aerogenes - MIC*    CEFAZOLIN >=64 RESISTANT Resistant     CEFEPIME <=0.12 SENSITIVE Sensitive     CEFTRIAXONE <=0.25 SENSITIVE Sensitive     CIPROFLOXACIN <=0.25 SENSITIVE Sensitive     GENTAMICIN <=1 SENSITIVE Sensitive     IMIPENEM 0.5  SENSITIVE Sensitive     NITROFURANTOIN 64 INTERMEDIATE Intermediate     TRIMETH/SULFA <=20 SENSITIVE Sensitive     PIP/TAZO <=4 SENSITIVE Sensitive     * 10,000 COLONIES/mL ENTEROBACTER AEROGENES   Escherichia coli - MIC*    AMPICILLIN >=32 RESISTANT Resistant     CEFAZOLIN >=64 RESISTANT Resistant     CEFEPIME 2 SENSITIVE Sensitive     CEFTRIAXONE >=64 RESISTANT Resistant     CIPROFLOXACIN 1 RESISTANT Resistant     GENTAMICIN <=1 SENSITIVE Sensitive     IMIPENEM <=0.25 SENSITIVE Sensitive     NITROFURANTOIN <=16 SENSITIVE Sensitive     TRIMETH/SULFA <=20 SENSITIVE Sensitive     AMPICILLIN/SULBACTAM >=32 RESISTANT Resistant     PIP/TAZO 64 INTERMEDIATE Intermediate     * 60,000 COLONIES/mL ESCHERICHIA COLI         Radiology Studies: No results found.      Scheduled Meds:  sodium chloride   Intravenous Once   sodium chloride   Intravenous Once   dexamethasone  4 mg Oral BID WC   famotidine  20 mg Oral BID   levETIRAcetam  500 mg Oral BID   sulfamethoxazole-trimethoprim  1 tablet Oral Q12H   Continuous Infusions:  sodium chloride 125 mL/hr at 01/12/22 1104     LOS: 6 days    Time spent: over 30 min    Fayrene Helper, MD [facility]   To contact the attending provider between 7A-7P or the covering provider during after hours 7P-7A, please log into the web site [email or web address] and access using universal [place] password for that web site. If you do not have the password, please call the hospital operator.  01/12/2022, 7:16 PM

## 2022-01-12 NOTE — Progress Notes (Signed)
24 hour chart audit completed 

## 2022-01-13 ENCOUNTER — Encounter: Payer: 59 | Admitting: Occupational Therapy

## 2022-01-13 ENCOUNTER — Ambulatory Visit: Payer: 59 | Admitting: Physical Therapy

## 2022-01-13 DIAGNOSIS — R109 Unspecified abdominal pain: Secondary | ICD-10-CM | POA: Diagnosis not present

## 2022-01-13 DIAGNOSIS — R5381 Other malaise: Secondary | ICD-10-CM | POA: Diagnosis not present

## 2022-01-13 DIAGNOSIS — C719 Malignant neoplasm of brain, unspecified: Secondary | ICD-10-CM | POA: Diagnosis not present

## 2022-01-13 LAB — COMPREHENSIVE METABOLIC PANEL
ALT: 59 U/L — ABNORMAL HIGH (ref 0–44)
AST: 21 U/L (ref 15–41)
Albumin: 2.6 g/dL — ABNORMAL LOW (ref 3.5–5.0)
Alkaline Phosphatase: 44 U/L (ref 38–126)
Anion gap: 7 (ref 5–15)
BUN: 12 mg/dL (ref 6–20)
CO2: 23 mmol/L (ref 22–32)
Calcium: 8.1 mg/dL — ABNORMAL LOW (ref 8.9–10.3)
Chloride: 106 mmol/L (ref 98–111)
Creatinine, Ser: <0.3 mg/dL — ABNORMAL LOW (ref 0.44–1.00)
Glucose, Bld: 148 mg/dL — ABNORMAL HIGH (ref 70–99)
Potassium: 3.8 mmol/L (ref 3.5–5.1)
Sodium: 136 mmol/L (ref 135–145)
Total Bilirubin: 1 mg/dL (ref 0.3–1.2)
Total Protein: 4.7 g/dL — ABNORMAL LOW (ref 6.5–8.1)

## 2022-01-13 LAB — CBC WITH DIFFERENTIAL/PLATELET
Abs Immature Granulocytes: 0.3 10*3/uL — ABNORMAL HIGH (ref 0.00–0.07)
Basophils Absolute: 0 10*3/uL (ref 0.0–0.1)
Basophils Relative: 1 %
Eosinophils Absolute: 0 10*3/uL (ref 0.0–0.5)
Eosinophils Relative: 0 %
HCT: 24.8 % — ABNORMAL LOW (ref 36.0–46.0)
Hemoglobin: 8.4 g/dL — ABNORMAL LOW (ref 12.0–15.0)
Immature Granulocytes: 16 %
Lymphocytes Relative: 7 %
Lymphs Abs: 0.1 10*3/uL — ABNORMAL LOW (ref 0.7–4.0)
MCH: 32.9 pg (ref 26.0–34.0)
MCHC: 33.9 g/dL (ref 30.0–36.0)
MCV: 97.3 fL (ref 80.0–100.0)
Monocytes Absolute: 0.2 10*3/uL (ref 0.1–1.0)
Monocytes Relative: 10 %
Neutro Abs: 1.3 10*3/uL — ABNORMAL LOW (ref 1.7–7.7)
Neutrophils Relative %: 66 %
Platelets: 83 10*3/uL — ABNORMAL LOW (ref 150–400)
RBC: 2.55 MIL/uL — ABNORMAL LOW (ref 3.87–5.11)
RDW: 17.4 % — ABNORMAL HIGH (ref 11.5–15.5)
WBC: 1.9 10*3/uL — ABNORMAL LOW (ref 4.0–10.5)
nRBC: 7.4 % — ABNORMAL HIGH (ref 0.0–0.2)

## 2022-01-13 LAB — MAGNESIUM: Magnesium: 2 mg/dL (ref 1.7–2.4)

## 2022-01-13 LAB — PHOSPHORUS: Phosphorus: 2.9 mg/dL (ref 2.5–4.6)

## 2022-01-13 NOTE — TOC Transition Note (Signed)
Transition of Care [facility]) - CM/SW Discharge Note   Patient Details  Name: Patricia Stevenson MRN: [id number] Date of Birth: Jan 09, 1961  [facility] CM/SW Contact:  Lynnell Catalan, RN Phone Number: 01/13/2022, 1:32 PM   Clinical Narrative:    [facility] called for transport to pt home. Home address confirmed with pt.

## 2022-01-13 NOTE — Discharge Summary (Signed)
[facility]  Physician Discharge Summary   Patient ID: Patricia Stevenson MRN: [id number] DOB/AGE: 59-Apr-1962 59 y.o.  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   She's had 2 days of R sided flank pain, woke up last night screaming in pain.  Lasted minutes, then recurred.  Recurred a 3rd time around 10 am this morning.  Denies trauma, fevers, change in urinary habits.  She's been unable to walk for about 2 weeks.  She's been more confused for 3-4 weeks.  She was recently discharged after an admission for sepsis/neutropenic fever without clear infectious source identified.  She was treated with broad spectrum abx which were ultimately discontinued.  She was given granix x1 on 5/10.  She was also transfused with platelets for thrombocytopenia.   She's here today again due to pain.  Of note, Dr. Mickeal Skinner had discussion with the patient/family regarding her recent decline and they'd began to discuss goals of care.   She was admitted with flank pain, CT scan showed perirenal stranding on the right as well as other abnormal findings  concerning for thrombosis of renal venous tributaries, recent forniceal rupture, or hematoma along the right kidney lower pole pelvis with effacement of the venous structures.  She was found to have a UTI and treated with cefepime.  She was discharged on bactrim.  He abdominal discomfort had improved by the time of discharge.  She was seen by Dr. Mickeal Skinner who discussed that she had limited options and no treatments remaining that could provide mortality benefit, palliative care was consulted and has been following.  Plan for palliative to follow outpatient.  Consultations: Palliative care Medical oncology  Procedures: Platelet transfusion    HOSPITAL COURSE:   Flank pain Related to abnormal CT scan  New abnormal perirenal stranding on R ddx includes thrombosis of renal venous tributaries vs recent forniceal rupture vs hematoma along right kidney lower pole pelvis with effacement of venous structures.  Of note, she's not a candidate for anticoaguation with thrombocytopenia.  Also, discussion with urology notes another possible dx would be renal stone (limited eval with contrast). Renal artery [place] with patent IVC and renal veins bilaterally, right renal vein patent prox mid, distal and at hiluim.  Left renal vein patent distal and at hilum, limited visualization at prox and mid segments. UA with RBC's, negative leukocyte and nitrite -> 60,000 e. Coli, 10,000 enterobacter aerogenes - both sensitive to cefepime. Cefepime 5/18 - 5/22.  Prescription sent for Bactrim by Dr. Florene Glen. S/p 2 units platelets 5/17 given possible hematoma  No further work-up or intervention per urology.     Acute pyelonephritis abx as above  Pancytopenia ([facility]) Given abnormal renal imaging, concern for possible hematoma S/p platelets x2 units 5/17 She has scattered petechiae and purpura  Transfuse another unit of platelets on 5/23.  Counts have improved.    High grade glioma not classifiable by WHO criteria [facility]) S/p  surgical resection and chemoradiation with temodar Currently temodar on hold per recent d/c summary Continue keppra and decadron Dr. Mickeal Skinner planning on discussion regarding goals of care, he'd broached this discussion 5/16 with family.   Appreciate Dr. Renda Rolls assistance -> open to visiting with palliative care, will consult at this time.  Continued palliative care discussions outpatient.   Acute metabolic encephalopathy MRI 5/7 with mixed imaging response to therapy since march -> regressed enhancement in R midbrain, but new petichial enhancement in R optic radiations, new discontinuous T2/Flair hyperintensity in both medial R thalamus and R corona radiata Head CT with punctate focus of increased density in R pons (may represent small punctate focus of hemorrhage within previously noted tumor) - neuro oncology aware Overall mental status seems improved with abx, stable for discharge   Paroxysmal atrial fibrillation (HCC) Sinus brady No anticoagulation with thrombocytopenia   Flat affect Related to encephalopathy vs mood/depression   Hypokalemia Hyponatremia   Patient stable this morning.  Okay for discharge home today.   PERTINENT LABS:  The results of significant diagnostics from this hospitalization (including imaging, microbiology, ancillary and laboratory) are listed below for reference.    Microbiology: Recent Results (from the past 240 hour(s))  Urine Culture     Status: Abnormal   Collection Time: 01/06/22  3:51 PM   Specimen: Urine, Clean Catch  Result Value Ref Range Status   Specimen Description   Final    URINE, CLEAN CATCH Performed at [facility], [place] [address], [place], [place] [address]    Special Requests   Final    NONE Performed at [facility], [place] [address], [place], [place] [address]    Culture (A)  Final    60,000 COLONIES/mL ESCHERICHIA COLI 10,000 COLONIES/mL ENTEROBACTER AEROGENES    Report Status  01/09/2022 FINAL  Final   Organism ID, Bacteria ESCHERICHIA COLI (A)  Final   Organism ID, Bacteria ENTEROBACTER AEROGENES (A)  Final      Susceptibility   Enterobacter aerogenes - MIC*    CEFAZOLIN >=64 RESISTANT Resistant     CEFEPIME <=0.12 SENSITIVE Sensitive     CEFTRIAXONE <=0.25 SENSITIVE Sensitive     CIPROFLOXACIN <=0.25 SENSITIVE Sensitive     GENTAMICIN <=1 SENSITIVE Sensitive     IMIPENEM 0.5 SENSITIVE Sensitive     NITROFURANTOIN 64 INTERMEDIATE Intermediate     TRIMETH/SULFA <=20 SENSITIVE Sensitive     PIP/TAZO <=4 SENSITIVE Sensitive     * 10,000 COLONIES/mL ENTEROBACTER AEROGENES   Escherichia coli - MIC*    AMPICILLIN >=32 RESISTANT Resistant     CEFAZOLIN >=64 RESISTANT Resistant     CEFEPIME 2 SENSITIVE Sensitive     CEFTRIAXONE >=64 RESISTANT Resistant     CIPROFLOXACIN 1 RESISTANT Resistant     GENTAMICIN <=1 SENSITIVE Sensitive     IMIPENEM <=0.25 SENSITIVE Sensitive     NITROFURANTOIN <=16 SENSITIVE Sensitive     TRIMETH/SULFA <=20 SENSITIVE Sensitive     AMPICILLIN/SULBACTAM >=32 RESISTANT Resistant     PIP/TAZO 64 INTERMEDIATE Intermediate     * 60,000 COLONIES/mL ESCHERICHIA COLI     Labs:  COVID-19 Labs   Lab Results  Component Value Date   [place] NEGATIVE 10/27/2021  [place] NEGATIVE 09/15/2021   [place] NEGATIVE 09/08/2021   [place] Not Detected 06/29/2019      Basic Metabolic Panel: Recent Labs  Lab 01/09/22 0606 01/10/22 0522 01/11/22 0439 01/12/22 0459 01/13/22 0518  NA 133* 135 136 135 136  K 3.8 3.8 3.8 3.5 3.8  CL 104 104 107 107 106  CO2 '23 24 23 23 23  '$ GLUCOSE 128* 143* 155* 157* 148*  BUN 19 22* '15 14 12  '$ CREATININE 0.33* 0.31* <0.30* <0.30* <0.30*  CALCIUM 8.3* 8.5* 8.0* 7.9* 8.1*  MG 2.1 2.2 2.0 2.0 2.0  PHOS 2.7 3.0 2.3* 2.9 2.9   Liver Function Tests: Recent Labs  Lab 01/09/22 0606 01/10/22 0522 01/11/22 0439 01/12/22 0459 01/13/22 0518  AST '16 18 21 19 21  '$ ALT 56* 58* 68* 59* 59*   ALKPHOS 44 48 47 45 44  BILITOT 0.6 0.4 0.3 0.5 1.0  PROT 5.0* 5.0* 4.9* 4.5* 4.7*  ALBUMIN 2.6* 2.7* 2.6* 2.4* 2.6*   CBC: Recent Labs  Lab 01/09/22 0606 01/10/22 0522 01/11/22 0439 01/12/22 0459 01/13/22 0518  WBC 2.6* 2.3* 1.9* 1.2* 1.9*  NEUTROABS 2.0 1.6* 1.5* 0.8* 1.3*  HGB 9.7* 9.5* 9.1* 8.9* 8.4*  HCT 27.1* 26.8* 26.3* 25.2* 24.8*  MCV 94.1 93.4 94.6 94.7 97.3  PLT 72* 54* 37* 30* 83*      IMAGING STUDIES CT HEAD WO CONTRAST (5MM)  Result Date: 01/07/2022 CLINICAL DATA:  Mental status change, unknown cause EXAM: CT HEAD WITHOUT CONTRAST TECHNIQUE: Contiguous axial images were obtained from the base of the skull through the vertex without intravenous contrast. RADIATION DOSE REDUCTION: This exam was performed according to the departmental dose-optimization program which includes automated exposure control, adjustment of the mA and/or kV according to patient size and/or use of iterative reconstruction technique. COMPARISON:  12/26/2021 CT head, 12/27/2021 MRI head FINDINGS: Brain: Redemonstrated right temporal lobe resection cavity and encephalomalacia. Redemonstrated mildly hyperdense tumor in the region of the right temporal lobe, cerebral peduncle, midbrain, and pons, which appears overall unchanged from the prior exam; more punctate focus of increased density in the right pons (series 2, image 8), which may represent a small punctate focus of hemorrhage. No definite acute infarct, mass effect, or midline shift. No hydrocephalus or extra-axial collection. Periventricular white matter changes, likely the sequela of chronic small vessel ischemic disease. Vascular: No hyperdense vessel. Skull: Right frontotemporal craniotomy. No acute fracture or suspicious osseous lesion. Sinuses/Orbits: No acute finding. Other: The mastoids are well aerated. IMPRESSION: Punctate focus of increased density in the right pons, which may represent a small punctate focus of hemorrhage within the  previously noted tumor. Otherwise unchanged tumor in the right temporal lobe, cerebral peduncle, midbrain, and pons. Electronically Signed   By: Merilyn Baba M.D.   On: 01/07/2022 11:19   CT Head Wo Contrast  Result Date: 12/26/2021 CLINICAL DATA:  Altered mental status EXAM: CT HEAD WITHOUT CONTRAST TECHNIQUE: Contiguous axial images were obtained from the base of the skull through the vertex without intravenous contrast. RADIATION DOSE REDUCTION: This exam was performed according to the departmental dose-optimization program which includes automated exposure control, adjustment of the mA and/or kV according to patient size and/or use of iterative reconstruction technique. COMPARISON:  None Available. FINDINGS: Brain: Right temporal lobe encephalomalacia at site of prior tumor resection. There is hyperdensity near the right cerebral peduncle corresponding to previously demonstrated area of tumor. No acute hemorrhage or extra-axial collection. Generalized volume loss with periventricular white matter changes. Vascular: No abnormal hyperdensity  of the major intracranial arteries or dural venous sinuses. No intracranial atherosclerosis. Skull: Remote right craniotomy. Sinuses/Orbits: No fluid levels or advanced mucosal thickening of the visualized paranasal sinuses. No mastoid or middle ear effusion. The orbits are normal. IMPRESSION: 1. No acute intracranial abnormality. 2. Right temporal lobe encephalomalacia at site of prior tumor resection. Electronically Signed   By: Ulyses Jarred M.D.   On: 12/26/2021 23:07   CT Angio Chest PE W and/or Wo Contrast  Result Date: 12/16/2021 CLINICAL DATA:  Shortness of breath and left lower extremity edema. Brain tumor. EXAM: CT ANGIOGRAPHY CHEST WITH CONTRAST TECHNIQUE: Multidetector CT imaging of the chest was performed using the standard protocol during bolus administration of intravenous contrast. Multiplanar CT image reconstructions and MIPs were obtained to evaluate  the vascular anatomy. RADIATION DOSE REDUCTION: This exam was performed according to the departmental dose-optimization program which includes automated exposure control, adjustment of the mA and/or kV according to patient size and/or use of iterative reconstruction technique. CONTRAST:  29m OMNIPAQUE IOHEXOL 350 MG/ML SOLN COMPARISON:  None. FINDINGS: Cardiovascular: Negative for pulmonary embolus. Atherosclerotic calcification of the aorta. Enlarged pulmonic trunk. Heart is at the upper limits of normal in size. No pericardial effusion. Mediastinum/Nodes: No pathologically enlarged mediastinal, hilar or axillary lymph nodes. Esophagus is grossly unremarkable. Lungs/Pleura: Ground-glass is seen in both lower lobes and may be due to slight expiratory phase imaging. No dense airspace consolidation. No pleural fluid. Airway is unremarkable. Upper Abdomen: Visualized portions of the liver, adrenal glands, kidneys, spleen, pancreas, stomach and bowel are grossly unremarkable. Musculoskeletal: Degenerative changes in the spine. No worrisome lytic or sclerotic lesions. Review of the MIP images confirms the above findings. IMPRESSION: 1. Negative for pulmonary embolus. 2. No definitive findings to explain the patient's clinical history. 3.  Aortic atherosclerosis (ICD10-I70.0). 4. Enlarged pulmonic trunk, indicative of pulmonary arterial hypertension. Electronically Signed   By: MLorin PicketM.D.   On: 12/16/2021 16:10   MR BRAIN W WO CONTRAST  Result Date: 12/27/2021 CLINICAL DATA:  61year old female with high-grade glioma status post subtotal resection. Altered mental status. EXAM: MRI HEAD WITHOUT AND WITH CONTRAST TECHNIQUE: Multiplanar, multiecho pulse sequences of the brain and surrounding structures were obtained without and with intravenous contrast. CONTRAST:  525mGADAVIST GADOBUTROL 1 MMOL/ML IV SOLN COMPARISON:  Head CT yesterday. Restaging brain MRI 10/27/2021 and earlier. FINDINGS: Brain: Rounded,  masslike enhancement at the right midbrain and cerebral peduncle has regressed and is now indistinct on series 16, image 57. However, nearby Patchy and confluent enhancement at the right optic radiations appears increased and is not obviously vascular related (series 16, image 58 and also postcontrast coronal series 19, image 16). Regional temporal lobe resection cavity has not significantly changed in size or configuration. Stable associated hemosiderin. And confluent regional T2 and FLAIR hyperintensity is stable to mildly improved. However, there is new discontinuous T2/FLAIR abnormality in the posteromedial right thalamus on series 11, image 25. And ongoing discontinuous although improved T2 hyperintensity in the right brainstem at the pons (series 8, image 7). Additionally, confluent right corona radiata T2 and FLAIR hyperintensity has progressed since March but is facilitated on diffusion (series 11, image 36), nonenhancing. On DWI heterogeneity in the tumor region and right brainstem is not significantly changed. Post craniotomy dural thickening along the right middle cranial fossa is stable. No midline shift. Stable ventricle size and configuration. No restricted diffusion suggestive of acute infarction. No acute intracranial hemorrhage. Cervicomedullary junction and pituitary are within normal limits. Stable gray and  white matter signal elsewhere. Vascular: Major intracranial vascular flow voids are stable. The major dural venous sinuses are enhancing and appear to be patent. Skull and upper cervical spine: Negative visible cervical spine and spinal cord. Stable bone marrow signal. Sinuses/Orbits: Stable and negative. Other: Visible internal auditory structures appear normal. Stable scalp and face. IMPRESSION: 1. Mixed imaging response to therapy since March: Regressed enhancement in the right midbrain, but new petechial enhancement in the right optic radiations. New discontinuous T2/FLAIR hyperintensity in  both the medial right thalamus and right corona radiata. Otherwise essentially stable appearance of infiltrating tumor in the right hemisphere and brainstem. 2. No new intracranial abnormality. Electronically Signed   By: Genevie Ann M.D.   On: 12/27/2021 09:09   CT ABDOMEN PELVIS W CONTRAST  Result Date: 01/06/2022 CLINICAL DATA:  Abdominal pain, especially in the right upper quadrant, over the last 2 days. High-grade glioma, completed radiation therapy 1 month ago. EXAM: CT ABDOMEN AND PELVIS WITH CONTRAST TECHNIQUE: Multidetector CT imaging of the abdomen and pelvis was performed using the standard protocol following bolus administration of intravenous contrast. RADIATION DOSE REDUCTION: This exam was performed according to the departmental dose-optimization program which includes automated exposure control, adjustment of the mA and/or kV according to patient size and/or use of iterative reconstruction technique. CONTRAST:  110m OMNIPAQUE IOHEXOL 300 MG/ML  SOLN COMPARISON:  12/26/2021 FINDINGS: Despite efforts by the technologist and patient, motion artifact is present on today's exam and could not be eliminated. This reduces exam sensitivity and specificity. Lower chest: Dependent subsegmental atelectasis or scarring in both lower lobes, similar to the 12/26/2021 exam. Hepatobiliary: Cholecystectomy.  Otherwise unremarkable. Pancreas: Unremarkable Spleen: Unremarkable Adrenals/Urinary Tract: New abnormal right perirenal fluid. 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 Accentuated corticomedullary differentiation in the right kidney lower pole compared to the left kidney and the right kidney upper pole, suspicious for mildly delayed right kidney lower pole nephrogram. Reduced visualization of right kidney lower pole venous tributaries compared to the 12/26/2021 exam, cannot exclude thrombosis of these tributaries although the motion  artifact on portal venous and delayed phase images causes some diagnostic uncertainty. On delayed phase images, parenchymal enhancement in the right kidney lower pole matches the other 2 kidneys and there is some excretion contrast from the lower pole moiety. Small bilateral hypodense lesions are technically too small to characterize although statistically likely to be benign. Urinary bladder unremarkable. Stomach/Bowel: Normal appendix. Prominent stool throughout the colon favors constipation. Vascular/Lymphatic: As noted in the urinary tract section above, there is some reduce conspicuity of venous structures to the right kidney lower pole raising the possibility of thrombosis of venous tributaries from the right kidney lower pole. Otherwise unremarkable. Gonadal veins patent. Reproductive: Unremarkable Other: No supplemental non-categorized findings. Musculoskeletal: Right eccentric disc bulge at L5-S1. IMPRESSION: 1. At least partially duplicated right renal collecting system, with new abnormal perirenal stranding on the right indicating the right kidney is the likely cause for the patient's abdominal pain. There is some subtle additional findings including slightly earlier contrast phase in the right kidney lower pole (with greater corticomedullary differentiation) and reduced conspicuity of right renal vein structures in the lower pole compared to the upper pole and left kidney. There is also increased fullness of the right kidney lower pole structures on delayed images compared to 12/26/2021. Motion artifact complicates the assessment. The patient has known substantial thrombocytopenia and hematuria today. Possibilities given this appearance and scenario may include thrombosis  of renal venous tributaries from the right kidney lower pole moiety; recent forniceal rupture of the right kidney lower pole with some resulting effacement of venous structures; or hematoma along the right kidney lower pole pelvis with  effacement of venous structures. Doppler renal vascular sonography with attention to the lower pole of the right kidney may provide helpful complementary information. 2. Bibasilar scarring or subsegmental atelectasis. 3.  Prominent stool throughout the colon favors constipation. 4. Disc bulge at L5-S1. These results were called by telephone at the time of interpretation on 01/06/2022 at 2:55 pm to provider Dr. Dorie Rank , who verbally acknowledged these results. Electronically Signed   By: Van Clines M.D.   On: 01/06/2022 15:02   CT Abdomen Pelvis W Contrast  Result Date: 12/26/2021 CLINICAL DATA:  Abdominal pain. EXAM: CT ABDOMEN AND PELVIS WITH CONTRAST TECHNIQUE: Multidetector CT imaging of the abdomen and pelvis was performed using the standard protocol following bolus administration of intravenous contrast. RADIATION DOSE REDUCTION: This exam was performed according to the departmental dose-optimization program which includes automated exposure control, adjustment of the mA and/or kV according to patient size and/or use of iterative reconstruction technique. CONTRAST:  116m OMNIPAQUE IOHEXOL 300 MG/ML  SOLN COMPARISON:  CT abdomen pelvis dated 06/10/2021. FINDINGS: Lower chest: Bibasilar linear atelectasis. No intra-abdominal free air or free fluid. Hepatobiliary: The liver is unremarkable. No intrahepatic biliary dilatation. Cholecystectomy. Pancreas: Unremarkable. No pancreatic ductal dilatation or surrounding inflammatory changes. Spleen: Normal in size without focal abnormality. Adrenals/Urinary Tract: The adrenal glands unremarkable the kidneys, visualized ureters, and urinary bladder appear unremarkable. Stomach/Bowel: There is moderate stool throughout the colon. There is no bowel obstruction or active inflammation. The appendix is normal. Vascular/Lymphatic: The abdominal aorta and IVC are unremarkable. No portal venous gas. There is no adenopathy. Reproductive: The uterus and ovaries are  grossly unremarkable. No pelvic mass. Other: None Musculoskeletal: No acute or significant osseous findings. IMPRESSION: 1. No acute intra-abdominal or pelvic pathology. 2. Moderate colonic stool burden. No bowel obstruction. Normal appendix. Electronically Signed   By: AAnner CreteM.D.   On: 12/26/2021 23:22   DG CHEST PORT 1 VIEW  Result Date: 01/06/2022 CLINICAL DATA:  [place]. EXAM: PORTABLE CHEST 1 VIEW COMPARISON:  Dec 27, 2021. FINDINGS: The heart size and mediastinal contours are within normal limits. Both lungs are clear. The visualized skeletal structures are unremarkable. IMPRESSION: No active disease. Electronically Signed   By: JMarijo ConceptionM.D.   On: 01/06/2022 17:41   Portable chest 1 View  Result Date: 12/27/2021 CLINICAL DATA:  Generalized weakness. EXAM: PORTABLE CHEST 1 VIEW COMPARISON:  Chest radiograph dated 12/16/2021 and CT dated 12/16/2021 FINDINGS: Minimal left lung base atelectasis. No focal consolidation, pleural effusion, pneumothorax. Stable cardiac silhouette. No acute osseous pathology. IMPRESSION: No active disease. Electronically Signed   By: AAnner CreteM.D.   On: 12/27/2021 00:47   DG Chest Port 1 View  Result Date: 12/16/2021 CLINICAL DATA:  Shortness of breath EXAM: PORTABLE CHEST 1 VIEW COMPARISON:  None. FINDINGS: The heart size and mediastinal contours are within normal limits. Both lungs are clear. The visualized skeletal structures are unremarkable. IMPRESSION: No acute abnormality of the lungs in AP portable projection. Electronically Signed   By: ADelanna AhmadiM.D.   On: 12/16/2021 15:26   VAS [place]OWER EXTREMITY VENOUS (DVT) (ONLY [facility] & [facility])  Result Date: 12/16/2021  Lower Venous DVT Study Patient Name:  Patricia Stevenson Date of Exam:   12/16/2021 Medical Rec #:  [id number]     Accession #:    [id number] Date of Birth: [birth year]    Patient Gender: F Patient Age:   27 years Exam Location:  [facility] Procedure:      VAS [place] LOWER EXTREMITY VENOUS  (DVT) Referring Phys: JOSEPH ZAMMIT --------------------------------------------------------------------------------  Indications: SOB, and LLE edema.  Risk Factors: Chemotherapy. Comparison Study: No previous exams Performing Technologist: Jody Hill RVT, RDMS  Examination Guidelines: A complete evaluation includes B-mode imaging, spectral Doppler, color Doppler, and power Doppler as needed of all accessible portions of each vessel. Bilateral testing is considered an integral part of a complete examination. Limited examinations for reoccurring indications may be performed as noted. The reflux portion of the exam is performed with the patient in reverse Trendelenburg.  +---------+---------------+---------+-----------+----------+--------------+ RIGHT    CompressibilityPhasicitySpontaneityPropertiesThrombus Aging +---------+---------------+---------+-----------+----------+--------------+ CFV      Full           Yes      Yes                                 +---------+---------------+---------+-----------+----------+--------------+ SFJ      Full                                                        +---------+---------------+---------+-----------+----------+--------------+ FV Prox  Full           Yes      Yes                                 +---------+---------------+---------+-----------+----------+--------------+ FV Mid   Full           Yes      Yes                                 +---------+---------------+---------+-----------+----------+--------------+ FV DistalFull           Yes      Yes                                 +---------+---------------+---------+-----------+----------+--------------+ PFV      Full                                                        +---------+---------------+---------+-----------+----------+--------------+ POP      Full           Yes      Yes                                  +---------+---------------+---------+-----------+----------+--------------+ PTV      Full                                                        +---------+---------------+---------+-----------+----------+--------------+  PERO     Full                                                        +---------+---------------+---------+-----------+----------+--------------+   +---------+---------------+---------+-----------+----------+--------------+ LEFT     CompressibilityPhasicitySpontaneityPropertiesThrombus Aging +---------+---------------+---------+-----------+----------+--------------+ CFV      Full           Yes      Yes                                 +---------+---------------+---------+-----------+----------+--------------+ SFJ      Full                                                        +---------+---------------+---------+-----------+----------+--------------+ FV Prox  Full           Yes      Yes                                 +---------+---------------+---------+-----------+----------+--------------+ FV Mid   Full           Yes      Yes                                 +---------+---------------+---------+-----------+----------+--------------+ FV DistalFull           Yes      Yes                                 +---------+---------------+---------+-----------+----------+--------------+ PFV      Full                                                        +---------+---------------+---------+-----------+----------+--------------+ POP      Full           Yes      Yes                                 +---------+---------------+---------+-----------+----------+--------------+ PTV      Full                                                        +---------+---------------+---------+-----------+----------+--------------+ PERO     Full                                                         +---------+---------------+---------+-----------+----------+--------------+  Summary: BILATERAL: - No evidence of deep vein thrombosis seen in the lower extremities, bilaterally. - No evidence of superficial venous thrombosis in the lower extremities, bilaterally. -No evidence of popliteal cyst, bilaterally.  LEFT: - subcutaneous edema seen in area of popliteal fossa and calf.  *See table(s) above for measurements and observations. Electronically signed by Harold Barban MD on 12/16/2021 at 8:14:16 PM.    Final    VAS US RENAL ARTERY DUPLEX  Result Date: 01/06/2022 ABDOMINAL VISCERAL Patient Name:  Patricia Stevenson  Date of Exam:   01/06/2022 Medical Rec #: [id number]     Accession #:    [id number] Date of Birth: 1961/02/02    Patient Gender: F Patient Age:   84 years Exam Location:  [facility] Procedure:      VAS US RENAL ARTERY DUPLEX Referring Phys: [id number] JON KNAPP -------------------------------------------------------------------------------- Indications: Suspicion for renal vein thrombosis on CT Limitations: Air/bowel gas, obesity and patient immobility. Comparison Study: 01-06-2022 CT abdomen pelvis with contrast Performing Technologist: Rogelia Rohrer RVT, RDMS  Examination Guidelines: A complete evaluation includes B-mode imaging, spectral Doppler, color Doppler, and power Doppler as needed of all accessible portions of each vessel. Bilateral testing is considered an integral part of a complete examination. Limited examinations for reoccurring indications may be performed as noted.  Duplex Findings:  Summary: Renal:  Bilateral: IVC and renal veins are patent bilaterally. Right renal            vein patent prox, mid, distal, and at hilum, Left renal            vein patent distal and at hilum. Limited visualization at            prox and mid segments.  *See table(s) above for measurements and observations.  Diagnosing physician: Jamelle Haring  Electronically signed by Jamelle Haring on 01/06/2022 at 9:28:42  PM.    Final     DISCHARGE EXAMINATION: Vitals:   01/12/22 2216 01/13/22 0108 01/13/22 0136 01/13/22 0457  BP: 114/72 110/70 93/67 132/69  Pulse: 70 66 60 (!) 59  Resp: '17 17 16 17  '$ Temp: 98.6 F (37 C) 98.1 F (36.7 C) 98.1 F (36.7 C) 97.9 F (36.6 C)  TempSrc: Oral Oral Oral Oral  SpO2: 97% 99%  98%  Weight:    55.8 kg  Height:       General appearance: Awake alert.  In no distress Resp: Clear to auscultation bilaterally.  Normal effort Cardio: S1-S2 is normal regular.  No S3-S4.  No rubs murmurs or bruit GI: Abdomen is soft.  Nontender nondistended.  Bowel sounds are present normal.  No masses organomegaly   DISPOSITION: Home with family  Discharge Instructions     Call MD for:  difficulty breathing, headache or visual disturbances   Complete by: As directed    Call MD for:  extreme fatigue   Complete by: As directed    Call MD for:  extreme fatigue   Complete by: As directed    Call MD for:  hives   Complete by: As directed    Call MD for:  persistant dizziness or light-headedness   Complete by: As directed    Call MD for:  persistant dizziness or light-headedness   Complete by: As directed    Call MD for:  persistant nausea and vomiting   Complete by: As directed    Call MD for:  persistant nausea and vomiting   Complete by: As directed    Call MD  for:  redness, tenderness, or signs of infection (pain, swelling, redness, odor or green/yellow discharge around incision site)   Complete by: As directed    Call MD for:  severe uncontrolled pain   Complete by: As directed    Call MD for:  severe uncontrolled pain   Complete by: As directed    Call MD for:  temperature >100.4   Complete by: As directed    Call MD for:  temperature >100.4   Complete by: As directed    Diet - low sodium heart healthy   Complete by: As directed    Discharge instructions   Complete by: As directed    You were seen for right sided flank pain and a urinary tract infection.  Your  CT scan showed abnormal findings in the kidney, the cause was unclear, but I wonder if this may all be related to your infection.    Please follow up with neuro oncology as an outpatient.  Your blood counts have continued to decrease.  You'll need follow up outpatient to determine whether you'll need transfusions or other treatment going forward.   Continue goals of care discussions with Dr. Mickeal Skinner and palliative care.  We'll have palliative care follow outpatient with you.  Avoid NSAIDS (ibuprofen, advil, naproxon, etc) and other over the counter medicines unless you clear them with neuro oncology or oncology.  Return for new, recurrent, or worsening symptoms.  Please ask your PCP to request records from this hospitalization so they know what was done and what the next steps will be.   Discharge instructions   Complete by: As directed    Please take your medications as prescribed.  Please be sure to follow-up with Dr. Ernestina Penna office in the next few weeks to determine further plan for your cancer.  You were cared for by a hospitalist during your hospital stay. If you have any questions about your discharge medications or the care you received while you were in the hospital after you are discharged, you can call the unit and asked to speak with the hospitalist on call if the hospitalist that took care of you is not available. Once you are discharged, your primary care physician will handle any further medical issues. Please note that NO REFILLS for any discharge medications will be authorized once you are discharged, as it is imperative that you return to your primary care physician (or establish a relationship with a primary care physician if you do not have one) for your aftercare needs so that they can reassess your need for medications and monitor your lab values. If you do not have a primary care physician, you can call [phone] for a physician referral.   Increase activity slowly   Complete by:  As directed    Increase activity slowly   Complete by: As directed          Allergies as of 01/13/2022       Reactions   Latex Rash        Medication List     TAKE these medications    acetaminophen 500 MG tablet Commonly known as: TYLENOL Take 1,000 mg by mouth every 6 (six) hours as needed for moderate pain or headache.   calcium carbonate 1500 (600 Ca) MG Tabs tablet Commonly known as: OSCAL Take 1,500 mg by mouth 2 (two) times daily with a meal.   dexamethasone 4 MG tablet Commonly known as: DECADRON Take 1 tablet (4 mg total) by mouth 2 (two) times  daily with a meal.   famotidine 20 MG tablet Commonly known as: Pepcid Take 1 tablet (20 mg total) by mouth 2 (two) times daily.   levETIRAcetam 500 MG tablet Commonly known as: Keppra Take 1 tablet (500 mg total) by mouth 2 (two) times daily.   ondansetron 8 MG tablet Commonly known as: Zofran Take 1 tablet (8 mg total) by mouth 2 (two) times daily as needed (nausea and vomiting). May take 30-60 minutes prior to Temodar administration if nausea/vomiting occurs.   VITA-C PO Take 500 mg by mouth daily.   VITAMIN D PO Take 1,000 Units by mouth daily.       ASK your doctor about these medications    sulfamethoxazole-trimethoprim 800-160 MG tablet Commonly known as: BACTRIM DS Take 1 tablet by mouth every 12 (twelve) hours for 2 days. Ask about: Should I take this medication?          Follow-up [facility]. Follow up.   Why: For home health physical and occupational therapies Contact information: [place] [address] [phone]         Truitt Merle, MD Follow up.   Specialties: Hematology, Oncology Contact information: [place] [address] [phone]                 TOTAL DISCHARGE TIME: 49 minutes  [place]  [facility] Pager on [email or web address]  01/14/2022, 11:38 AM

## 2022-01-13 NOTE — Progress Notes (Signed)
Daily Progress Note   Patient Name: Patricia Stevenson       Date: 01/13/2022 DOB: Feb 15, 1961  Age: 61 y.o. MRN#: [id number] Attending Physician: Bonnielee Haff, MD Primary Care Physician: Isaac Bliss, Rayford Halsted, MD Admit Date: 01/06/2022  Reason for Consultation/Follow-up: Establishing goals of care  Subjective: Awake alert Sitting up in bed Friend at bedside.   Length of Stay: 7  Current Medications: Scheduled Meds:   sodium chloride   Intravenous Once   dexamethasone  4 mg Oral BID WC   famotidine  20 mg Oral BID   levETIRAcetam  500 mg Oral BID   sulfamethoxazole-trimethoprim  1 tablet Oral Q12H    Continuous Infusions:  sodium chloride 125 mL/hr at 01/12/22 2000    PRN Meds: acetaminophen **OR** [DISCONTINUED] acetaminophen, lidocaine, ondansetron **OR** ondansetron (ZOFRAN) IV, oxyCODONE, oxyCODONE, polyethylene glycol  Physical Exam         Awake alert oriented Appears chronically ill Has scattered petechiae and ecchymosis Answers questions appropriately Follows commands  Vital Signs: BP 132/69 (BP Location: Right Arm)   Pulse (!) 59   Temp 97.9 F (36.6 C) (Oral)   Resp 17   Ht 5' (1.524 m) Comment: per 1 week ago documentation  Wt 55.8 kg   SpO2 98%   BMI 24.03 kg/m  SpO2: SpO2: 98 % O2 Device: O2 Device: Room Air O2 Flow Rate: O2 Flow Rate (L/min): 0 L/min  Intake/output summary:  Intake/Output Summary (Last 24 hours) at 01/13/2022 1241 Last data filed at 01/13/2022 1044 Gross per 24 hour  Intake 4469.49 ml  Output 3000 ml  Net 1469.49 ml    LBM: Last BM Date : 01/12/22 Baseline Weight: Weight: 57 kg Most recent weight: Weight: 55.8 kg       Palliative Assessment/Data:    Flowsheet Rows    Flowsheet Row Most Recent Value  Intake Tab    Referral Department Hospitalist  Unit at Time of Referral Med/Surg Unit  Palliative Care Primary Diagnosis Cancer  Date Notified 01/07/22  Palliative Care Type New Palliative care  Reason for referral Clarify Goals of Care  Date of Admission 01/06/22  Date first seen by Palliative Care 01/08/22  # of days Palliative referral response time 1 Day(s)  # of days IP prior to Palliative referral 1  Clinical Assessment   Psychosocial &  Spiritual Assessment   Palliative Care Outcomes        Patient Active Problem List   Diagnosis Date Noted   Acute pyelonephritis 01/07/2022   Flank pain 16/05/9603   Acute metabolic encephalopathy 54/04/8118   Shortness of breath 01/06/2022   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   Lymphopenia 12/30/2021   SIRS  12/29/2021   Neutropenia (HCC) 12/29/2021   Pancytopenia ([place]) 12/29/2021   Bradycardia 12/29/2021   Hyponatremia 12/29/2021   Hypokalemia 12/29/2021   Physical deconditioning 12/29/2021   Left-sided weakness 10/27/2021   Paroxysmal atrial fibrillation ([place]) 10/27/2021   Hypertension 09/16/2021   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  Admitted to hospital medicine service, being followed up by medical oncology for pancytopenia.  Hospital course also significant for ongoing flank pain and atrial fibrillation and pancytopenia.  Recommendations/Plan: Patient is asking about discharge today, palliative medicine team initially consulted for goals of care discussions.  Chart reviewed.  Patient seen and examined.  Note that the patient has already been connected with palliative services and is to have outpatient  palliative care through [facility] at home upon discharge.  Recommend continuation of outpatient palliative support.   Code Status:    Code Status Orders  (From admission, onward)           Start     Ordered   01/06/22 1932  Full code  Continuous        01/06/22 1931           Code Status History     Date Active Date Inactive Code Status Order ID Comments User Context   12/26/2021 2351 01/02/2022 2213 Full Code [id number]  Marcelyn Bruins, MD ED   10/27/2021 2330 10/28/2021 1844 Full Code [id number]  Toy Baker, MD ED   09/15/2021 1608 09/19/2021 1929 Full Code [id number]  Vallarie Mare, MD Inpatient   09/08/2021 1459 09/10/2021 1919 Full Code [id number]  Earnie Larsson, MD ED      Advance Directive Documentation    Flowsheet Row Most Recent Value  Type of Advance Directive Healthcare Power of Attorney, Living will  Pre-existing out of facility DNR order (yellow form or pink MOST form) --  "MOST" Form in Place? --       Prognosis:  Guarded   Discharge Planning: Home with Palliative Services  Care plan was discussed with  patient, IDT  Thank you for allowing the Palliative Medicine Team to assist in the care of this patient.   Time In: 12 Time Out: 12.25 Total Time 25 Prolonged Time Billed  no       Greater than 50%  of this time was spent counseling and coordinating care related to the above assessment and plan.  Loistine Chance, MD  Please contact Palliative Medicine Team phone at [phone] for questions and concerns.

## 2022-01-14 ENCOUNTER — Telehealth: Payer: Self-pay | Admitting: Family Medicine

## 2022-01-14 ENCOUNTER — Telehealth: Payer: Self-pay | Admitting: Internal Medicine

## 2022-01-14 DIAGNOSIS — R001 Bradycardia, unspecified: Secondary | ICD-10-CM | POA: Diagnosis not present

## 2022-01-14 DIAGNOSIS — R6520 Severe sepsis without septic shock: Secondary | ICD-10-CM | POA: Diagnosis not present

## 2022-01-14 DIAGNOSIS — E871 Hypo-osmolality and hyponatremia: Secondary | ICD-10-CM | POA: Diagnosis not present

## 2022-01-14 DIAGNOSIS — D61818 Other pancytopenia: Secondary | ICD-10-CM | POA: Diagnosis not present

## 2022-01-14 DIAGNOSIS — D709 Neutropenia, unspecified: Secondary | ICD-10-CM | POA: Diagnosis not present

## 2022-01-14 DIAGNOSIS — A419 Sepsis, unspecified organism: Secondary | ICD-10-CM | POA: Diagnosis not present

## 2022-01-14 LAB — BPAM PLATELET PHERESIS
Blood Product Expiration Date: 202305252359
ISSUE DATE / TIME: 202305240110
Unit Type and Rh: 5100

## 2022-01-14 LAB — PREPARE PLATELET PHERESIS: Unit division: 0

## 2022-01-14 NOTE — Telephone Encounter (Signed)
Verbal orders given to Betsy.  

## 2022-01-14 NOTE — Telephone Encounter (Signed)
Attempted to contact daughter [name] to schedule Palliative Consult, no answer - left message with reason for call along with my name and call back number requesting return call.

## 2022-01-14 NOTE — Telephone Encounter (Signed)
[facility] is calling and needs verbal orders for PT 2X5, 1X2 also need nursing evaluation follow up on uti .

## 2022-01-15 ENCOUNTER — Encounter: Payer: 59 | Admitting: Occupational Therapy

## 2022-01-15 ENCOUNTER — Telehealth: Payer: Self-pay | Admitting: *Deleted

## 2022-01-15 ENCOUNTER — Ambulatory Visit: Payer: 59 | Admitting: Physical Therapy

## 2022-01-15 ENCOUNTER — Telehealth: Payer: Self-pay | Admitting: Internal Medicine

## 2022-01-15 DIAGNOSIS — D61818 Other pancytopenia: Secondary | ICD-10-CM | POA: Diagnosis not present

## 2022-01-15 DIAGNOSIS — A419 Sepsis, unspecified organism: Secondary | ICD-10-CM | POA: Diagnosis not present

## 2022-01-15 DIAGNOSIS — E871 Hypo-osmolality and hyponatremia: Secondary | ICD-10-CM | POA: Diagnosis not present

## 2022-01-15 DIAGNOSIS — D709 Neutropenia, unspecified: Secondary | ICD-10-CM | POA: Diagnosis not present

## 2022-01-15 DIAGNOSIS — R001 Bradycardia, unspecified: Secondary | ICD-10-CM | POA: Diagnosis not present

## 2022-01-15 DIAGNOSIS — R6520 Severe sepsis without septic shock: Secondary | ICD-10-CM | POA: Diagnosis not present

## 2022-01-15 NOTE — Telephone Encounter (Signed)
Almyra Free (interpreter) states she spoke with Barrie's daughter and they are going to need transportation with assistance for her appts on 6/1 and 6/1.   Almyra Free also states daughter mentioned that patient might want to stop coming. RN LM for daughter to call us to discuss.  Email sent to [employer].

## 2022-01-15 NOTE — Telephone Encounter (Signed)
Will from OT w/ [facility] called for verbal orders for  patient  OT 2 times a week for 4 weeks [phone] Will

## 2022-01-19 ENCOUNTER — Telehealth: Payer: Self-pay

## 2022-01-19 DIAGNOSIS — E871 Hypo-osmolality and hyponatremia: Secondary | ICD-10-CM | POA: Diagnosis not present

## 2022-01-19 DIAGNOSIS — R001 Bradycardia, unspecified: Secondary | ICD-10-CM | POA: Diagnosis not present

## 2022-01-19 DIAGNOSIS — R6520 Severe sepsis without septic shock: Secondary | ICD-10-CM | POA: Diagnosis not present

## 2022-01-19 DIAGNOSIS — D61818 Other pancytopenia: Secondary | ICD-10-CM | POA: Diagnosis not present

## 2022-01-19 DIAGNOSIS — A419 Sepsis, unspecified organism: Secondary | ICD-10-CM | POA: Diagnosis not present

## 2022-01-19 DIAGNOSIS — D709 Neutropenia, unspecified: Secondary | ICD-10-CM | POA: Diagnosis not present

## 2022-01-19 NOTE — Telephone Encounter (Signed)
Attempted to contact patient's daughter Caroline to schedule a Palliative Care consult appointment. No answer left a message to return call.  

## 2022-01-20 ENCOUNTER — Telehealth: Payer: Self-pay

## 2022-01-20 DIAGNOSIS — D61818 Other pancytopenia: Secondary | ICD-10-CM | POA: Diagnosis not present

## 2022-01-20 DIAGNOSIS — A419 Sepsis, unspecified organism: Secondary | ICD-10-CM | POA: Diagnosis not present

## 2022-01-20 DIAGNOSIS — D709 Neutropenia, unspecified: Secondary | ICD-10-CM | POA: Diagnosis not present

## 2022-01-20 DIAGNOSIS — R6520 Severe sepsis without septic shock: Secondary | ICD-10-CM | POA: Diagnosis not present

## 2022-01-20 DIAGNOSIS — E871 Hypo-osmolality and hyponatremia: Secondary | ICD-10-CM | POA: Diagnosis not present

## 2022-01-20 DIAGNOSIS — R001 Bradycardia, unspecified: Secondary | ICD-10-CM | POA: Diagnosis not present

## 2022-01-20 NOTE — Telephone Encounter (Signed)
Spoke with patient's daughter [name] and scheduled a in person Palliative Consult for 01/21/22 @ 8:30 AM.   Consent obtained; updated [facility], Team List and Epic.

## 2022-01-21 ENCOUNTER — Inpatient Hospital Stay: Payer: 59 | Admitting: Internal Medicine

## 2022-01-21 ENCOUNTER — Telehealth: Payer: Self-pay | Admitting: Family Medicine

## 2022-01-21 ENCOUNTER — Encounter: Payer: Self-pay | Admitting: Family Medicine

## 2022-01-21 ENCOUNTER — Ambulatory Visit: Payer: 59 | Admitting: Physical Therapy

## 2022-01-21 ENCOUNTER — Encounter: Payer: Self-pay | Admitting: Internal Medicine

## 2022-01-21 ENCOUNTER — Encounter: Payer: 59 | Admitting: Occupational Therapy

## 2022-01-21 ENCOUNTER — Other Ambulatory Visit: Payer: 59 | Admitting: Family Medicine

## 2022-01-21 ENCOUNTER — Inpatient Hospital Stay: Payer: 59

## 2022-01-21 VITALS — BP 128/84 | HR 72 | Resp 16 | Wt 123.0 lb

## 2022-01-21 DIAGNOSIS — B372 Candidiasis of skin and nail: Secondary | ICD-10-CM | POA: Insufficient documentation

## 2022-01-21 DIAGNOSIS — K5900 Constipation, unspecified: Secondary | ICD-10-CM | POA: Insufficient documentation

## 2022-01-21 DIAGNOSIS — C719 Malignant neoplasm of brain, unspecified: Secondary | ICD-10-CM

## 2022-01-21 DIAGNOSIS — Z515 Encounter for palliative care: Secondary | ICD-10-CM | POA: Insufficient documentation

## 2022-01-21 NOTE — Telephone Encounter (Signed)
TCT pt's daughter in follow up.  On visit this am when pt admitted to Palliative Care, pt's sister who offered translation said that she was not decision maker for the patient with regard to goals of care.  She advised talking with spouse and daughter of pt.  Explained the difference between Palliative and Hospice Care.  Daughter, [name], said they were wanting hospice, that the patient had no further treatment options, had done surgery and chemo but did not want to pursue resuscitation or aggressive measures including intubation if treatment was unsuccessful. She states she does not have power of attorney that the decision belongs to her father and he works until 3:30 pm.  She states she spoke with Dr Mickeal Skinner, pt's Oncologist this am and he agreed that there was no further treatment for patient, that Hospice was appropriate, and was willing to remain her attending.  Sent message through [facility] chat to Dr Mickeal Skinner and his nurse Joelene Millin who had communicated earlier and advised that they call the [facility] to have the referral made to Hospice.  Advised that he indicate he was willing to remain as her attending and if he thought her prognosis with the normal course of the disease would be 6 months or less. Daughter has requested MOST form in Spanish for her father.  He does speak some English but she wants him to understand what he is signing when he completes goals of care for her mom and she also asks for visit after 3:30 if possible in the next day or so since he works. Advised I would follow up with the office to see if we have the material in Spanish for the MOST but advised that we do have someone who can do interpretation for [place] as well.  Advised I would get back with her.  Notified Sherrie DeCosmo in referral center to be on the lookout for the referral from Dr Mickeal Skinner with daughter's request for after 3:30 pm admission.  Damaris Hippo FNP-C

## 2022-01-21 NOTE — Progress Notes (Signed)
[facility] Palliative Care Consult Note Telephone: [phone]  Fax: [phone]   Date of encounter: 01/21/22 9:07 AM PATIENT NAME: Patricia Stevenson [address] [place] [address]   [phone] (home) [phone] (work) DOB: 1960-12-10 MRN: [id number] PRIMARY CARE PROVIDER:    Isaac Bliss, Rayford Halsted, MD,  [place] [address] [phone]  REFERRING PROVIDER:   Isaac Bliss, Rayford Halsted, MD [address] [place],   [address] [phone]  RESPONSIBLE PARTY:    Contact Information     Name Relation Home Work Mobile   Iovine,Martin Spouse [phone]     Wetona, Viramontes   [phone]   [place] Daughter   [phone]        I met face to face with patient and sisters in her home.  Later spoke with pt's daughter [name] by phone. Palliative Care was asked to follow this patient by consultation request of  Leotis Shames* to address advance care planning and complex medical decision making. This is the initial visit.          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 Declines further treatment Agree with Decadron to decrease cerebral edema and Keppra for seizure prophylaxis. Currently not having pain or nausea.  2.  Palliative Care Encounter Discussed goals of care with patient, sister and daughter. MOST form to be completed with pt's spouse but family wants to pursue Hospice Notified Oncologist to refer for Hospice, if appropriate  3.  Constipation Sennosides 1-2 tabs BID If no relief from Sennosides, can insert Dulcolax suppository.  4.  Candidiasis of skin folds Nystatin powder topically applied to affected skin folds BID    Follow up Palliative Care Visit: Palliative care will continue to follow  for complex medical decision making, advance care planning, and clarification of goals. Return 4 weeks if not referred to Hospice.    This visit was coded based on medical decision making (MDM).  PPS: 30%  HOSPICE ELIGIBILITY/DIAGNOSIS: TBD  Chief Complaint:  [facility] received a referral to follow up with patient for chronic disease management in setting of brain glioma.  Follow up is to assist with advance directive counseling and defining/refining goals of care.   HISTORY OF PRESENT ILLNESS:  IVOREE FELMLEE is a 61 y.o. year old female with high grade glioma of the brain s/p right temporal resection and chemotherapy.  Pt was poorly tolerant of chemotherapy and developed severe thrombocytopenia.  Per pt's sister Dewitt Hoes who provides translation today, pt has been told that there is a new chemotherapy but it is unlikely to significantly change the course of her disease.  Pt has opted to stop treatment because of the adverse effects of chemotherapy which were previously poorly tolerated.  She has recently had an episode of SIRS with hospitalization and would like to be at home, avoiding further hospitalization if possible.  She was recently treated for pyelonephritis and denies pain currently except in low abdomen, worse on the left.  Sister states she eats well and has high appetite because of the steroids but had a very small BM yesterday.  Pt denies pain, SOB, nausea, vomiting or blood in stool.  She has an itchy, erythematous confluent rash in left axilla.  Sister denies falls or skin breakdown but states pt is bedbound and unable to  ambulate.  Pt's brothers come by almost daily to lift her out of bed and put her in a chair so she can spend time in the living room with her family which her sister states boosts her spirits. She does c/o pain when they are lifting her but otherwise has been good.  Sister states she has had no headache, nausea, vomiting or seizures.  She is  on Keppra and Decadron, has Tylenol for pain and Zofran for nausea. ON discussion with pt and sister she indicated a desire for full code but does not want further treatment.  Discussion of risks of CPR to include rib fracture, intubation and continued progression of her disease.  Sister states that this decision will be made by pt, her spouse and daughter but they are interested in pursuing hospice for additional in home support.  Daughter is supposed to contact me later today for discussion.   History obtained from review of EMR, discussion with sisters and/or Ms. Shutters.  I reviewed available labs, medications, imaging, studies and related documents from the EMR.  Records reviewed and summarized above.   ROS General: NAD EYES: denies vision changes ENMT: denies dysphagia or difficulty swallowing pills Cardiovascular: denies chest pain, denies DOE Pulmonary: denies cough, denies increased SOB Abdomen: endorses good appetite, endorses constipation, endorses continence of bowel GU: denies dysuria, endorses incontinence of urine MSK:  endorses increased weakness, no falls reported, bedbound, unable to ambulate Skin: Itchy rash to right axilla Neurological: denies pain, denies insomnia Psych: Endorses positive mood Heme/lymph/immuno: pinpoint bruising and scattered bruising of bilateral upper extremities, denies abnormal bleeding  Physical Exam: Current and past weights: 123 lbs 0.3 ounces as of 01/12/22 Constitutional: NAD, appears fatigued General: frail appearing Head: surgical scar and bony skull defect or right temporal region EYES: anicteric sclera, lids intact, no discharge  ENMT: intact hearing, oral mucous membranes moist, dentition intact CV: S1S2, RRR, no LE edema Pulmonary: CTAB, no increased work of breathing, no cough, room air Abdomen: hypo-active BS + 4 quadrants, soft and non tender, no ascites GU: deferred MSK: no sarcopenia, bedbound, minimal spontaneous movement Skin:  warm and dry, erythematous patchy rash to right axilla Neuro:  noted generalized weakness, no cognitive impairment Psych: non-anxious affect, drowsy, lethargic, arousable, does not make eye contact or offer much conversation Hem/lymph/immuno: scattered bruising and petecchiae of BUE  CURRENT PROBLEM LIST:  Patient Active Problem List   Diagnosis Date Noted   Acute pyelonephritis 01/07/2022   Flank pain 99/35/7017   Acute metabolic encephalopathy 79/39/0300   Shortness of breath 01/06/2022   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   Lymphopenia 12/30/2021   SIRS  12/29/2021   Neutropenia ([facility]) 12/29/2021   Pancytopenia ([facility]) 12/29/2021   Bradycardia 12/29/2021   Hyponatremia 12/29/2021   Hypokalemia 12/29/2021   Physical deconditioning 12/29/2021   Left-sided weakness 10/27/2021   Paroxysmal atrial fibrillation ([facility]) 10/27/2021   Hypertension 09/16/2021   Right temporal lobe mass 09/15/2021   High grade glioma not classifiable by WHO criteria ([place]) 09/15/2021   Vitamin D deficiency 05/23/2020   AMENORRHEA 03/13/2009   PAST MEDICAL HISTORY:  Active Ambulatory Problems    Diagnosis Date Noted   AMENORRHEA 03/13/2009   Vitamin D deficiency 05/23/2020   Right temporal lobe mass 09/15/2021   High grade glioma not classifiable by WHO criteria ([place]) 09/15/2021   Hypertension 09/16/2021   Left-sided weakness 10/27/2021   Paroxysmal atrial fibrillation ([facility]) 10/27/2021   SIRS  12/29/2021   Neutropenia ([facility] [place]) 12/29/2021   Pancytopenia ([place]) 12/29/2021  Bradycardia 12/29/2021   Hyponatremia 12/29/2021   Hypokalemia 12/29/2021   Physical deconditioning 12/29/2021   Lymphopenia 12/30/2021   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   Flank pain 88/89/1694   Acute metabolic encephalopathy 50/38/8828   Shortness of breath 01/06/2022   Acute pyelonephritis 01/07/2022   Resolved Ambulatory Problems    Diagnosis Date Noted   Brain tumor ([facility]) 09/08/2021   Past Medical History:  Diagnosis Date   GERD  (gastroesophageal reflux disease)    Hyperlipidemia    SOCIAL HX:  Social History   Tobacco Use   Smoking status: Never   Smokeless tobacco: Never  Substance Use Topics   Alcohol use: No   FAMILY HX:  Family History  Problem Relation Age of Onset   Breast cancer Neg Hx    CAD Neg Hx        Preferred Pharmacy: ALLERGIES:  Allergies  Allergen Reactions   Latex Rash     PERTINENT MEDICATIONS:  Outpatient Encounter Medications as of 01/21/2022  Medication Sig   acetaminophen (TYLENOL) 500 MG tablet Take 1,000 mg by mouth every 6 (six) hours as needed for moderate pain or headache.   Ascorbic Acid (VITA-C PO) Take 500 mg by mouth daily.   calcium carbonate (OSCAL) 1500 (600 Ca) MG TABS tablet Take 1,500 mg by mouth 2 (two) times daily with a meal.   dexamethasone (DECADRON) 4 MG tablet Take 1 tablet (4 mg total) by mouth 2 (two) times daily with a meal.   famotidine (PEPCID) 20 MG tablet Take 1 tablet (20 mg total) by mouth 2 (two) times daily.   levETIRAcetam (KEPPRA) 500 MG tablet Take 1 tablet (500 mg total) by mouth 2 (two) times daily.   ondansetron (ZOFRAN) 8 MG tablet Take 1 tablet (8 mg total) by mouth 2 (two) times daily as needed (nausea and vomiting). May take 30-60 minutes prior to Temodar administration if nausea/vomiting occurs.   VITAMIN D PO Take 1,000 Units by mouth daily.   No facility-administered encounter medications on file as of 01/21/2022.     -------------------------------------------------------------------------------------------------------------------------------------------------------------------------------------------------------------------------------------------- Advance Care Planning/Goals of Care: Goals include to maximize quality of life and symptom management. Patient/health care surrogate gave his/her permission to discuss.Our advance care planning conversation included a discussion about:    The value and importance of advance care  planning  Experiences with loved ones who have been seriously ill or have died  Exploration of personal, cultural or spiritual beliefs that might influence medical decisions  Exploration of goals of care in the event of a sudden injury or illness  Identification  of a healthcare agent  Review and updating or creation of an  advance directive document . Decision not to resuscitate or to de-escalate disease focused treatments due to poor prognosis. CODE STATUS:  I spent 25 minutes providing this consultation providing Palliative Care counseling on goals of care. More than 50% of the time in this consultation was spent in counseling and care coordination. 5-7 minutes this am with pt and sister, 20 minutes on phone with daughter at 1:08 pm.   Thank you for the opportunity to participate in the care of Ms. Abee.  The palliative care team will continue to follow. Please call our office at [phone] if we can be of additional assistance.   Marijo Conception, FNP-C  COVID-19 PATIENT SCREENING TOOL Asked and negative response unless otherwise noted:  Have you had symptoms of covid, tested positive or been in contact with someone with symptoms/positive test in the past 5-10 days?

## 2022-01-21 NOTE — Telephone Encounter (Signed)
Verbal orders given to Will.  

## 2022-01-22 ENCOUNTER — Inpatient Hospital Stay: Payer: 59

## 2022-01-22 ENCOUNTER — Ambulatory Visit: Payer: 59 | Admitting: Physical Therapy

## 2022-01-22 ENCOUNTER — Encounter: Payer: 59 | Admitting: Occupational Therapy

## 2022-01-22 DIAGNOSIS — I4891 Unspecified atrial fibrillation: Secondary | ICD-10-CM | POA: Diagnosis not present

## 2022-01-22 DIAGNOSIS — R4182 Altered mental status, unspecified: Secondary | ICD-10-CM | POA: Diagnosis not present

## 2022-01-22 DIAGNOSIS — N390 Urinary tract infection, site not specified: Secondary | ICD-10-CM | POA: Diagnosis not present

## 2022-01-22 DIAGNOSIS — C712 Malignant neoplasm of temporal lobe: Secondary | ICD-10-CM | POA: Diagnosis not present

## 2022-01-22 DIAGNOSIS — E559 Vitamin D deficiency, unspecified: Secondary | ICD-10-CM | POA: Diagnosis not present

## 2022-01-22 DIAGNOSIS — D61818 Other pancytopenia: Secondary | ICD-10-CM | POA: Diagnosis not present

## 2022-01-22 DIAGNOSIS — E785 Hyperlipidemia, unspecified: Secondary | ICD-10-CM | POA: Diagnosis not present

## 2022-01-22 DIAGNOSIS — K219 Gastro-esophageal reflux disease without esophagitis: Secondary | ICD-10-CM | POA: Diagnosis not present

## 2022-01-22 DIAGNOSIS — I10 Essential (primary) hypertension: Secondary | ICD-10-CM | POA: Diagnosis not present

## 2022-01-23 DIAGNOSIS — N390 Urinary tract infection, site not specified: Secondary | ICD-10-CM | POA: Diagnosis not present

## 2022-01-23 DIAGNOSIS — I4891 Unspecified atrial fibrillation: Secondary | ICD-10-CM | POA: Diagnosis not present

## 2022-01-23 DIAGNOSIS — D61818 Other pancytopenia: Secondary | ICD-10-CM | POA: Diagnosis not present

## 2022-01-23 DIAGNOSIS — E559 Vitamin D deficiency, unspecified: Secondary | ICD-10-CM | POA: Diagnosis not present

## 2022-01-23 DIAGNOSIS — C712 Malignant neoplasm of temporal lobe: Secondary | ICD-10-CM | POA: Diagnosis not present

## 2022-01-23 DIAGNOSIS — R4182 Altered mental status, unspecified: Secondary | ICD-10-CM | POA: Diagnosis not present

## 2022-01-23 DIAGNOSIS — K219 Gastro-esophageal reflux disease without esophagitis: Secondary | ICD-10-CM | POA: Diagnosis not present

## 2022-01-23 DIAGNOSIS — I10 Essential (primary) hypertension: Secondary | ICD-10-CM | POA: Diagnosis not present

## 2022-01-23 DIAGNOSIS — E785 Hyperlipidemia, unspecified: Secondary | ICD-10-CM | POA: Diagnosis not present

## 2022-01-24 DIAGNOSIS — I4891 Unspecified atrial fibrillation: Secondary | ICD-10-CM | POA: Diagnosis not present

## 2022-01-24 DIAGNOSIS — E559 Vitamin D deficiency, unspecified: Secondary | ICD-10-CM | POA: Diagnosis not present

## 2022-01-24 DIAGNOSIS — R4182 Altered mental status, unspecified: Secondary | ICD-10-CM | POA: Diagnosis not present

## 2022-01-24 DIAGNOSIS — I10 Essential (primary) hypertension: Secondary | ICD-10-CM | POA: Diagnosis not present

## 2022-01-24 DIAGNOSIS — C712 Malignant neoplasm of temporal lobe: Secondary | ICD-10-CM | POA: Diagnosis not present

## 2022-01-24 DIAGNOSIS — N390 Urinary tract infection, site not specified: Secondary | ICD-10-CM | POA: Diagnosis not present

## 2022-01-24 DIAGNOSIS — E785 Hyperlipidemia, unspecified: Secondary | ICD-10-CM | POA: Diagnosis not present

## 2022-01-24 DIAGNOSIS — D61818 Other pancytopenia: Secondary | ICD-10-CM | POA: Diagnosis not present

## 2022-01-24 DIAGNOSIS — K219 Gastro-esophageal reflux disease without esophagitis: Secondary | ICD-10-CM | POA: Diagnosis not present

## 2022-01-25 ENCOUNTER — Other Ambulatory Visit: Payer: Self-pay

## 2022-01-25 ENCOUNTER — Other Ambulatory Visit: Payer: Self-pay | Admitting: Gastroenterology

## 2022-01-25 DIAGNOSIS — E785 Hyperlipidemia, unspecified: Secondary | ICD-10-CM | POA: Diagnosis not present

## 2022-01-25 DIAGNOSIS — N390 Urinary tract infection, site not specified: Secondary | ICD-10-CM | POA: Diagnosis not present

## 2022-01-25 DIAGNOSIS — K219 Gastro-esophageal reflux disease without esophagitis: Secondary | ICD-10-CM | POA: Diagnosis not present

## 2022-01-25 DIAGNOSIS — I4891 Unspecified atrial fibrillation: Secondary | ICD-10-CM | POA: Diagnosis not present

## 2022-01-25 DIAGNOSIS — D61818 Other pancytopenia: Secondary | ICD-10-CM | POA: Diagnosis not present

## 2022-01-25 DIAGNOSIS — E559 Vitamin D deficiency, unspecified: Secondary | ICD-10-CM | POA: Diagnosis not present

## 2022-01-25 DIAGNOSIS — I10 Essential (primary) hypertension: Secondary | ICD-10-CM | POA: Diagnosis not present

## 2022-01-25 DIAGNOSIS — C712 Malignant neoplasm of temporal lobe: Secondary | ICD-10-CM | POA: Diagnosis not present

## 2022-01-25 DIAGNOSIS — R4182 Altered mental status, unspecified: Secondary | ICD-10-CM | POA: Diagnosis not present

## 2022-01-26 ENCOUNTER — Ambulatory Visit
Admission: RE | Admit: 2022-01-26 | Discharge: 2022-01-26 | Disposition: A | Discharge status: Home / Self Care | Payer: 59 | Source: Ambulatory Visit | Attending: Radiation Oncology | Admitting: Radiation Oncology

## 2022-01-26 DIAGNOSIS — E559 Vitamin D deficiency, unspecified: Secondary | ICD-10-CM | POA: Diagnosis not present

## 2022-01-26 DIAGNOSIS — C712 Malignant neoplasm of temporal lobe: Secondary | ICD-10-CM | POA: Diagnosis not present

## 2022-01-26 DIAGNOSIS — R4182 Altered mental status, unspecified: Secondary | ICD-10-CM | POA: Diagnosis not present

## 2022-01-26 DIAGNOSIS — E785 Hyperlipidemia, unspecified: Secondary | ICD-10-CM | POA: Diagnosis not present

## 2022-01-26 DIAGNOSIS — I4891 Unspecified atrial fibrillation: Secondary | ICD-10-CM | POA: Diagnosis not present

## 2022-01-26 DIAGNOSIS — D61818 Other pancytopenia: Secondary | ICD-10-CM | POA: Diagnosis not present

## 2022-01-26 DIAGNOSIS — I10 Essential (primary) hypertension: Secondary | ICD-10-CM | POA: Diagnosis not present

## 2022-01-26 DIAGNOSIS — N390 Urinary tract infection, site not specified: Secondary | ICD-10-CM | POA: Diagnosis not present

## 2022-01-26 DIAGNOSIS — K219 Gastro-esophageal reflux disease without esophagitis: Secondary | ICD-10-CM | POA: Diagnosis not present

## 2022-01-26 DIAGNOSIS — C719 Malignant neoplasm of brain, unspecified: Secondary | ICD-10-CM

## 2022-01-27 ENCOUNTER — Ambulatory Visit: Payer: 59 | Admitting: Physical Therapy

## 2022-01-27 ENCOUNTER — Encounter: Payer: 59 | Admitting: Occupational Therapy

## 2022-01-27 DIAGNOSIS — N390 Urinary tract infection, site not specified: Secondary | ICD-10-CM | POA: Diagnosis not present

## 2022-01-27 DIAGNOSIS — D61818 Other pancytopenia: Secondary | ICD-10-CM | POA: Diagnosis not present

## 2022-01-27 DIAGNOSIS — C712 Malignant neoplasm of temporal lobe: Secondary | ICD-10-CM | POA: Diagnosis not present

## 2022-01-27 DIAGNOSIS — I4891 Unspecified atrial fibrillation: Secondary | ICD-10-CM | POA: Diagnosis not present

## 2022-01-27 DIAGNOSIS — K219 Gastro-esophageal reflux disease without esophagitis: Secondary | ICD-10-CM | POA: Diagnosis not present

## 2022-01-27 DIAGNOSIS — R4182 Altered mental status, unspecified: Secondary | ICD-10-CM | POA: Diagnosis not present

## 2022-01-27 DIAGNOSIS — E559 Vitamin D deficiency, unspecified: Secondary | ICD-10-CM | POA: Diagnosis not present

## 2022-01-27 DIAGNOSIS — I10 Essential (primary) hypertension: Secondary | ICD-10-CM | POA: Diagnosis not present

## 2022-01-27 DIAGNOSIS — E785 Hyperlipidemia, unspecified: Secondary | ICD-10-CM | POA: Diagnosis not present

## 2022-01-28 DIAGNOSIS — I4891 Unspecified atrial fibrillation: Secondary | ICD-10-CM | POA: Diagnosis not present

## 2022-01-28 DIAGNOSIS — E559 Vitamin D deficiency, unspecified: Secondary | ICD-10-CM | POA: Diagnosis not present

## 2022-01-28 DIAGNOSIS — I10 Essential (primary) hypertension: Secondary | ICD-10-CM | POA: Diagnosis not present

## 2022-01-28 DIAGNOSIS — E785 Hyperlipidemia, unspecified: Secondary | ICD-10-CM | POA: Diagnosis not present

## 2022-01-28 DIAGNOSIS — C712 Malignant neoplasm of temporal lobe: Secondary | ICD-10-CM | POA: Diagnosis not present

## 2022-01-28 DIAGNOSIS — K219 Gastro-esophageal reflux disease without esophagitis: Secondary | ICD-10-CM | POA: Diagnosis not present

## 2022-01-28 DIAGNOSIS — R4182 Altered mental status, unspecified: Secondary | ICD-10-CM | POA: Diagnosis not present

## 2022-01-28 DIAGNOSIS — D61818 Other pancytopenia: Secondary | ICD-10-CM | POA: Diagnosis not present

## 2022-01-28 DIAGNOSIS — N390 Urinary tract infection, site not specified: Secondary | ICD-10-CM | POA: Diagnosis not present

## 2022-01-29 ENCOUNTER — Encounter: Payer: 59 | Admitting: Occupational Therapy

## 2022-01-29 ENCOUNTER — Ambulatory Visit: Payer: 59 | Admitting: Physical Therapy

## 2022-01-29 DIAGNOSIS — I10 Essential (primary) hypertension: Secondary | ICD-10-CM | POA: Diagnosis not present

## 2022-01-29 DIAGNOSIS — E785 Hyperlipidemia, unspecified: Secondary | ICD-10-CM | POA: Diagnosis not present

## 2022-01-29 DIAGNOSIS — K219 Gastro-esophageal reflux disease without esophagitis: Secondary | ICD-10-CM | POA: Diagnosis not present

## 2022-01-29 DIAGNOSIS — R4182 Altered mental status, unspecified: Secondary | ICD-10-CM | POA: Diagnosis not present

## 2022-01-29 DIAGNOSIS — E559 Vitamin D deficiency, unspecified: Secondary | ICD-10-CM | POA: Diagnosis not present

## 2022-01-29 DIAGNOSIS — C712 Malignant neoplasm of temporal lobe: Secondary | ICD-10-CM | POA: Diagnosis not present

## 2022-01-29 DIAGNOSIS — D61818 Other pancytopenia: Secondary | ICD-10-CM | POA: Diagnosis not present

## 2022-01-29 DIAGNOSIS — I4891 Unspecified atrial fibrillation: Secondary | ICD-10-CM | POA: Diagnosis not present

## 2022-01-29 DIAGNOSIS — N390 Urinary tract infection, site not specified: Secondary | ICD-10-CM | POA: Diagnosis not present

## 2022-01-30 DIAGNOSIS — E559 Vitamin D deficiency, unspecified: Secondary | ICD-10-CM | POA: Diagnosis not present

## 2022-01-30 DIAGNOSIS — E785 Hyperlipidemia, unspecified: Secondary | ICD-10-CM | POA: Diagnosis not present

## 2022-01-30 DIAGNOSIS — D61818 Other pancytopenia: Secondary | ICD-10-CM | POA: Diagnosis not present

## 2022-01-30 DIAGNOSIS — K219 Gastro-esophageal reflux disease without esophagitis: Secondary | ICD-10-CM | POA: Diagnosis not present

## 2022-01-30 DIAGNOSIS — N390 Urinary tract infection, site not specified: Secondary | ICD-10-CM | POA: Diagnosis not present

## 2022-01-30 DIAGNOSIS — I10 Essential (primary) hypertension: Secondary | ICD-10-CM | POA: Diagnosis not present

## 2022-01-30 DIAGNOSIS — R4182 Altered mental status, unspecified: Secondary | ICD-10-CM | POA: Diagnosis not present

## 2022-01-30 DIAGNOSIS — I4891 Unspecified atrial fibrillation: Secondary | ICD-10-CM | POA: Diagnosis not present

## 2022-01-30 DIAGNOSIS — C712 Malignant neoplasm of temporal lobe: Secondary | ICD-10-CM | POA: Diagnosis not present

## 2022-01-31 DIAGNOSIS — C712 Malignant neoplasm of temporal lobe: Secondary | ICD-10-CM | POA: Diagnosis not present

## 2022-01-31 DIAGNOSIS — N390 Urinary tract infection, site not specified: Secondary | ICD-10-CM | POA: Diagnosis not present

## 2022-01-31 DIAGNOSIS — R4182 Altered mental status, unspecified: Secondary | ICD-10-CM | POA: Diagnosis not present

## 2022-01-31 DIAGNOSIS — D61818 Other pancytopenia: Secondary | ICD-10-CM | POA: Diagnosis not present

## 2022-01-31 DIAGNOSIS — E559 Vitamin D deficiency, unspecified: Secondary | ICD-10-CM | POA: Diagnosis not present

## 2022-01-31 DIAGNOSIS — I10 Essential (primary) hypertension: Secondary | ICD-10-CM | POA: Diagnosis not present

## 2022-01-31 DIAGNOSIS — K219 Gastro-esophageal reflux disease without esophagitis: Secondary | ICD-10-CM | POA: Diagnosis not present

## 2022-01-31 DIAGNOSIS — I4891 Unspecified atrial fibrillation: Secondary | ICD-10-CM | POA: Diagnosis not present

## 2022-01-31 DIAGNOSIS — E785 Hyperlipidemia, unspecified: Secondary | ICD-10-CM | POA: Diagnosis not present

## 2022-02-01 DIAGNOSIS — E785 Hyperlipidemia, unspecified: Secondary | ICD-10-CM | POA: Diagnosis not present

## 2022-02-01 DIAGNOSIS — N390 Urinary tract infection, site not specified: Secondary | ICD-10-CM | POA: Diagnosis not present

## 2022-02-01 DIAGNOSIS — E559 Vitamin D deficiency, unspecified: Secondary | ICD-10-CM | POA: Diagnosis not present

## 2022-02-01 DIAGNOSIS — K219 Gastro-esophageal reflux disease without esophagitis: Secondary | ICD-10-CM | POA: Diagnosis not present

## 2022-02-01 DIAGNOSIS — D61818 Other pancytopenia: Secondary | ICD-10-CM | POA: Diagnosis not present

## 2022-02-01 DIAGNOSIS — C712 Malignant neoplasm of temporal lobe: Secondary | ICD-10-CM | POA: Diagnosis not present

## 2022-02-01 DIAGNOSIS — R4182 Altered mental status, unspecified: Secondary | ICD-10-CM | POA: Diagnosis not present

## 2022-02-01 DIAGNOSIS — I4891 Unspecified atrial fibrillation: Secondary | ICD-10-CM | POA: Diagnosis not present

## 2022-02-01 DIAGNOSIS — I10 Essential (primary) hypertension: Secondary | ICD-10-CM | POA: Diagnosis not present

## 2022-02-01 NOTE — Progress Notes (Signed)
  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 She developed fatigue and anticipated skin changes in the treatment field. She did have some nausea during therapy and persistent deficits in memory during therapy. She's been referred for hospice care through [facility].   Impression/Plan: 1. High-grade glioma of the right temporal lobe. No calls were placed today due to pt enrolling in hospice.      Carola Rhine, PAC

## 2022-02-02 DIAGNOSIS — E785 Hyperlipidemia, unspecified: Secondary | ICD-10-CM | POA: Diagnosis not present

## 2022-02-02 DIAGNOSIS — C712 Malignant neoplasm of temporal lobe: Secondary | ICD-10-CM | POA: Diagnosis not present

## 2022-02-02 DIAGNOSIS — D61818 Other pancytopenia: Secondary | ICD-10-CM | POA: Diagnosis not present

## 2022-02-02 DIAGNOSIS — E559 Vitamin D deficiency, unspecified: Secondary | ICD-10-CM | POA: Diagnosis not present

## 2022-02-02 DIAGNOSIS — R4182 Altered mental status, unspecified: Secondary | ICD-10-CM | POA: Diagnosis not present

## 2022-02-02 DIAGNOSIS — I4891 Unspecified atrial fibrillation: Secondary | ICD-10-CM | POA: Diagnosis not present

## 2022-02-02 DIAGNOSIS — I10 Essential (primary) hypertension: Secondary | ICD-10-CM | POA: Diagnosis not present

## 2022-02-02 DIAGNOSIS — K219 Gastro-esophageal reflux disease without esophagitis: Secondary | ICD-10-CM | POA: Diagnosis not present

## 2022-02-02 DIAGNOSIS — N390 Urinary tract infection, site not specified: Secondary | ICD-10-CM | POA: Diagnosis not present

## 2022-02-03 ENCOUNTER — Ambulatory Visit: Payer: 59 | Admitting: Physical Therapy

## 2022-02-03 ENCOUNTER — Ambulatory Visit: Payer: 59 | Admitting: Occupational Therapy

## 2022-02-03 DIAGNOSIS — N390 Urinary tract infection, site not specified: Secondary | ICD-10-CM | POA: Diagnosis not present

## 2022-02-03 DIAGNOSIS — I4891 Unspecified atrial fibrillation: Secondary | ICD-10-CM | POA: Diagnosis not present

## 2022-02-03 DIAGNOSIS — I10 Essential (primary) hypertension: Secondary | ICD-10-CM | POA: Diagnosis not present

## 2022-02-03 DIAGNOSIS — R4182 Altered mental status, unspecified: Secondary | ICD-10-CM | POA: Diagnosis not present

## 2022-02-03 DIAGNOSIS — C712 Malignant neoplasm of temporal lobe: Secondary | ICD-10-CM | POA: Diagnosis not present

## 2022-02-03 DIAGNOSIS — D61818 Other pancytopenia: Secondary | ICD-10-CM | POA: Diagnosis not present

## 2022-02-03 DIAGNOSIS — K219 Gastro-esophageal reflux disease without esophagitis: Secondary | ICD-10-CM | POA: Diagnosis not present

## 2022-02-03 DIAGNOSIS — E559 Vitamin D deficiency, unspecified: Secondary | ICD-10-CM | POA: Diagnosis not present

## 2022-02-03 DIAGNOSIS — E785 Hyperlipidemia, unspecified: Secondary | ICD-10-CM | POA: Diagnosis not present

## 2022-02-04 DIAGNOSIS — D61818 Other pancytopenia: Secondary | ICD-10-CM | POA: Diagnosis not present

## 2022-02-04 DIAGNOSIS — E559 Vitamin D deficiency, unspecified: Secondary | ICD-10-CM | POA: Diagnosis not present

## 2022-02-04 DIAGNOSIS — E785 Hyperlipidemia, unspecified: Secondary | ICD-10-CM | POA: Diagnosis not present

## 2022-02-04 DIAGNOSIS — R4182 Altered mental status, unspecified: Secondary | ICD-10-CM | POA: Diagnosis not present

## 2022-02-04 DIAGNOSIS — K219 Gastro-esophageal reflux disease without esophagitis: Secondary | ICD-10-CM | POA: Diagnosis not present

## 2022-02-04 DIAGNOSIS — N390 Urinary tract infection, site not specified: Secondary | ICD-10-CM | POA: Diagnosis not present

## 2022-02-04 DIAGNOSIS — I10 Essential (primary) hypertension: Secondary | ICD-10-CM | POA: Diagnosis not present

## 2022-02-04 DIAGNOSIS — C712 Malignant neoplasm of temporal lobe: Secondary | ICD-10-CM | POA: Diagnosis not present

## 2022-02-04 DIAGNOSIS — I4891 Unspecified atrial fibrillation: Secondary | ICD-10-CM | POA: Diagnosis not present

## 2022-02-05 ENCOUNTER — Encounter: Payer: 59 | Admitting: Occupational Therapy

## 2022-02-05 ENCOUNTER — Ambulatory Visit: Payer: 59 | Admitting: Physical Therapy

## 2022-02-05 DIAGNOSIS — D61818 Other pancytopenia: Secondary | ICD-10-CM | POA: Diagnosis not present

## 2022-02-05 DIAGNOSIS — E785 Hyperlipidemia, unspecified: Secondary | ICD-10-CM | POA: Diagnosis not present

## 2022-02-05 DIAGNOSIS — I10 Essential (primary) hypertension: Secondary | ICD-10-CM | POA: Diagnosis not present

## 2022-02-05 DIAGNOSIS — R4182 Altered mental status, unspecified: Secondary | ICD-10-CM | POA: Diagnosis not present

## 2022-02-05 DIAGNOSIS — I4891 Unspecified atrial fibrillation: Secondary | ICD-10-CM | POA: Diagnosis not present

## 2022-02-05 DIAGNOSIS — C712 Malignant neoplasm of temporal lobe: Secondary | ICD-10-CM | POA: Diagnosis not present

## 2022-02-05 DIAGNOSIS — E559 Vitamin D deficiency, unspecified: Secondary | ICD-10-CM | POA: Diagnosis not present

## 2022-02-05 DIAGNOSIS — N390 Urinary tract infection, site not specified: Secondary | ICD-10-CM | POA: Diagnosis not present

## 2022-02-05 DIAGNOSIS — K219 Gastro-esophageal reflux disease without esophagitis: Secondary | ICD-10-CM | POA: Diagnosis not present

## 2022-02-06 DIAGNOSIS — I10 Essential (primary) hypertension: Secondary | ICD-10-CM | POA: Diagnosis not present

## 2022-02-06 DIAGNOSIS — I4891 Unspecified atrial fibrillation: Secondary | ICD-10-CM | POA: Diagnosis not present

## 2022-02-06 DIAGNOSIS — E785 Hyperlipidemia, unspecified: Secondary | ICD-10-CM | POA: Diagnosis not present

## 2022-02-06 DIAGNOSIS — D61818 Other pancytopenia: Secondary | ICD-10-CM | POA: Diagnosis not present

## 2022-02-06 DIAGNOSIS — N390 Urinary tract infection, site not specified: Secondary | ICD-10-CM | POA: Diagnosis not present

## 2022-02-06 DIAGNOSIS — R4182 Altered mental status, unspecified: Secondary | ICD-10-CM | POA: Diagnosis not present

## 2022-02-06 DIAGNOSIS — K219 Gastro-esophageal reflux disease without esophagitis: Secondary | ICD-10-CM | POA: Diagnosis not present

## 2022-02-06 DIAGNOSIS — C712 Malignant neoplasm of temporal lobe: Secondary | ICD-10-CM | POA: Diagnosis not present

## 2022-02-06 DIAGNOSIS — E559 Vitamin D deficiency, unspecified: Secondary | ICD-10-CM | POA: Diagnosis not present

## 2022-02-07 DIAGNOSIS — I4891 Unspecified atrial fibrillation: Secondary | ICD-10-CM | POA: Diagnosis not present

## 2022-02-07 DIAGNOSIS — I10 Essential (primary) hypertension: Secondary | ICD-10-CM | POA: Diagnosis not present

## 2022-02-07 DIAGNOSIS — K219 Gastro-esophageal reflux disease without esophagitis: Secondary | ICD-10-CM | POA: Diagnosis not present

## 2022-02-07 DIAGNOSIS — E559 Vitamin D deficiency, unspecified: Secondary | ICD-10-CM | POA: Diagnosis not present

## 2022-02-07 DIAGNOSIS — R4182 Altered mental status, unspecified: Secondary | ICD-10-CM | POA: Diagnosis not present

## 2022-02-07 DIAGNOSIS — E785 Hyperlipidemia, unspecified: Secondary | ICD-10-CM | POA: Diagnosis not present

## 2022-02-07 DIAGNOSIS — N390 Urinary tract infection, site not specified: Secondary | ICD-10-CM | POA: Diagnosis not present

## 2022-02-07 DIAGNOSIS — C712 Malignant neoplasm of temporal lobe: Secondary | ICD-10-CM | POA: Diagnosis not present

## 2022-02-07 DIAGNOSIS — D61818 Other pancytopenia: Secondary | ICD-10-CM | POA: Diagnosis not present

## 2022-02-08 ENCOUNTER — Encounter: Payer: Self-pay | Admitting: Physical Therapy

## 2022-02-08 DIAGNOSIS — E559 Vitamin D deficiency, unspecified: Secondary | ICD-10-CM | POA: Diagnosis not present

## 2022-02-08 DIAGNOSIS — E785 Hyperlipidemia, unspecified: Secondary | ICD-10-CM | POA: Diagnosis not present

## 2022-02-08 DIAGNOSIS — N390 Urinary tract infection, site not specified: Secondary | ICD-10-CM | POA: Diagnosis not present

## 2022-02-08 DIAGNOSIS — D61818 Other pancytopenia: Secondary | ICD-10-CM | POA: Diagnosis not present

## 2022-02-08 DIAGNOSIS — C712 Malignant neoplasm of temporal lobe: Secondary | ICD-10-CM | POA: Diagnosis not present

## 2022-02-08 DIAGNOSIS — I4891 Unspecified atrial fibrillation: Secondary | ICD-10-CM | POA: Diagnosis not present

## 2022-02-08 DIAGNOSIS — I10 Essential (primary) hypertension: Secondary | ICD-10-CM | POA: Diagnosis not present

## 2022-02-08 DIAGNOSIS — K219 Gastro-esophageal reflux disease without esophagitis: Secondary | ICD-10-CM | POA: Diagnosis not present

## 2022-02-08 DIAGNOSIS — R4182 Altered mental status, unspecified: Secondary | ICD-10-CM | POA: Diagnosis not present

## 2022-02-08 NOTE — Therapy (Signed)
[facility] [address] [place], [place], [address] Phone: [phone]   Fax:  [phone]  Patient Details  Name: Patricia Stevenson MRN: [id number] Date of Birth: 06-16-1961 Referring Provider:  No ref. provider found  Encounter Date: 02/08/2022  PHYSICAL THERAPY DISCHARGE SUMMARY  Visits from Start of Care: 2  Current functional level related to goals / functional outcomes: See eval    Remaining deficits: See eval   Education / Equipment: Unable to fully initiate; pt did not return after 2nd visit, due to change in medical status.   Patient agrees to discharge. Patient goals were not met. Patient is being discharged due to a change in medical status.   Raydell Maners W., PT 02/08/2022, 4:39 PM  [facility] [address], [place], [place], [address] Phone: [phone]   Fax:  [phone]

## 2022-02-09 DIAGNOSIS — R4182 Altered mental status, unspecified: Secondary | ICD-10-CM | POA: Diagnosis not present

## 2022-02-09 DIAGNOSIS — C712 Malignant neoplasm of temporal lobe: Secondary | ICD-10-CM | POA: Diagnosis not present

## 2022-02-09 DIAGNOSIS — D61818 Other pancytopenia: Secondary | ICD-10-CM | POA: Diagnosis not present

## 2022-02-09 DIAGNOSIS — I4891 Unspecified atrial fibrillation: Secondary | ICD-10-CM | POA: Diagnosis not present

## 2022-02-09 DIAGNOSIS — K219 Gastro-esophageal reflux disease without esophagitis: Secondary | ICD-10-CM | POA: Diagnosis not present

## 2022-02-09 DIAGNOSIS — E785 Hyperlipidemia, unspecified: Secondary | ICD-10-CM | POA: Diagnosis not present

## 2022-02-09 DIAGNOSIS — E559 Vitamin D deficiency, unspecified: Secondary | ICD-10-CM | POA: Diagnosis not present

## 2022-02-09 DIAGNOSIS — N390 Urinary tract infection, site not specified: Secondary | ICD-10-CM | POA: Diagnosis not present

## 2022-02-09 DIAGNOSIS — I10 Essential (primary) hypertension: Secondary | ICD-10-CM | POA: Diagnosis not present

## 2022-02-10 DIAGNOSIS — E559 Vitamin D deficiency, unspecified: Secondary | ICD-10-CM | POA: Diagnosis not present

## 2022-02-10 DIAGNOSIS — C712 Malignant neoplasm of temporal lobe: Secondary | ICD-10-CM | POA: Diagnosis not present

## 2022-02-10 DIAGNOSIS — K219 Gastro-esophageal reflux disease without esophagitis: Secondary | ICD-10-CM | POA: Diagnosis not present

## 2022-02-10 DIAGNOSIS — I10 Essential (primary) hypertension: Secondary | ICD-10-CM | POA: Diagnosis not present

## 2022-02-10 DIAGNOSIS — E785 Hyperlipidemia, unspecified: Secondary | ICD-10-CM | POA: Diagnosis not present

## 2022-02-10 DIAGNOSIS — I4891 Unspecified atrial fibrillation: Secondary | ICD-10-CM | POA: Diagnosis not present

## 2022-02-10 DIAGNOSIS — R4182 Altered mental status, unspecified: Secondary | ICD-10-CM | POA: Diagnosis not present

## 2022-02-10 DIAGNOSIS — D61818 Other pancytopenia: Secondary | ICD-10-CM | POA: Diagnosis not present

## 2022-02-10 DIAGNOSIS — N390 Urinary tract infection, site not specified: Secondary | ICD-10-CM | POA: Diagnosis not present

## 2022-02-11 DIAGNOSIS — I10 Essential (primary) hypertension: Secondary | ICD-10-CM | POA: Diagnosis not present

## 2022-02-11 DIAGNOSIS — D61818 Other pancytopenia: Secondary | ICD-10-CM | POA: Diagnosis not present

## 2022-02-11 DIAGNOSIS — K219 Gastro-esophageal reflux disease without esophagitis: Secondary | ICD-10-CM | POA: Diagnosis not present

## 2022-02-11 DIAGNOSIS — I4891 Unspecified atrial fibrillation: Secondary | ICD-10-CM | POA: Diagnosis not present

## 2022-02-11 DIAGNOSIS — R4182 Altered mental status, unspecified: Secondary | ICD-10-CM | POA: Diagnosis not present

## 2022-02-11 DIAGNOSIS — E559 Vitamin D deficiency, unspecified: Secondary | ICD-10-CM | POA: Diagnosis not present

## 2022-02-11 DIAGNOSIS — N390 Urinary tract infection, site not specified: Secondary | ICD-10-CM | POA: Diagnosis not present

## 2022-02-11 DIAGNOSIS — C712 Malignant neoplasm of temporal lobe: Secondary | ICD-10-CM | POA: Diagnosis not present

## 2022-02-11 DIAGNOSIS — E785 Hyperlipidemia, unspecified: Secondary | ICD-10-CM | POA: Diagnosis not present

## 2022-02-12 DIAGNOSIS — E785 Hyperlipidemia, unspecified: Secondary | ICD-10-CM | POA: Diagnosis not present

## 2022-02-12 DIAGNOSIS — I4891 Unspecified atrial fibrillation: Secondary | ICD-10-CM | POA: Diagnosis not present

## 2022-02-12 DIAGNOSIS — N390 Urinary tract infection, site not specified: Secondary | ICD-10-CM | POA: Diagnosis not present

## 2022-02-12 DIAGNOSIS — C712 Malignant neoplasm of temporal lobe: Secondary | ICD-10-CM | POA: Diagnosis not present

## 2022-02-12 DIAGNOSIS — K219 Gastro-esophageal reflux disease without esophagitis: Secondary | ICD-10-CM | POA: Diagnosis not present

## 2022-02-12 DIAGNOSIS — R4182 Altered mental status, unspecified: Secondary | ICD-10-CM | POA: Diagnosis not present

## 2022-02-12 DIAGNOSIS — D61818 Other pancytopenia: Secondary | ICD-10-CM | POA: Diagnosis not present

## 2022-02-12 DIAGNOSIS — E559 Vitamin D deficiency, unspecified: Secondary | ICD-10-CM | POA: Diagnosis not present

## 2022-02-12 DIAGNOSIS — I10 Essential (primary) hypertension: Secondary | ICD-10-CM | POA: Diagnosis not present

## 2022-02-13 DIAGNOSIS — E785 Hyperlipidemia, unspecified: Secondary | ICD-10-CM | POA: Diagnosis not present

## 2022-02-13 DIAGNOSIS — I4891 Unspecified atrial fibrillation: Secondary | ICD-10-CM | POA: Diagnosis not present

## 2022-02-13 DIAGNOSIS — K219 Gastro-esophageal reflux disease without esophagitis: Secondary | ICD-10-CM | POA: Diagnosis not present

## 2022-02-13 DIAGNOSIS — E559 Vitamin D deficiency, unspecified: Secondary | ICD-10-CM | POA: Diagnosis not present

## 2022-02-13 DIAGNOSIS — N390 Urinary tract infection, site not specified: Secondary | ICD-10-CM | POA: Diagnosis not present

## 2022-02-13 DIAGNOSIS — D61818 Other pancytopenia: Secondary | ICD-10-CM | POA: Diagnosis not present

## 2022-02-13 DIAGNOSIS — I10 Essential (primary) hypertension: Secondary | ICD-10-CM | POA: Diagnosis not present

## 2022-02-13 DIAGNOSIS — C712 Malignant neoplasm of temporal lobe: Secondary | ICD-10-CM | POA: Diagnosis not present

## 2022-02-13 DIAGNOSIS — R4182 Altered mental status, unspecified: Secondary | ICD-10-CM | POA: Diagnosis not present

## 2022-02-14 DIAGNOSIS — I10 Essential (primary) hypertension: Secondary | ICD-10-CM | POA: Diagnosis not present

## 2022-02-14 DIAGNOSIS — R4182 Altered mental status, unspecified: Secondary | ICD-10-CM | POA: Diagnosis not present

## 2022-02-14 DIAGNOSIS — K219 Gastro-esophageal reflux disease without esophagitis: Secondary | ICD-10-CM | POA: Diagnosis not present

## 2022-02-14 DIAGNOSIS — D61818 Other pancytopenia: Secondary | ICD-10-CM | POA: Diagnosis not present

## 2022-02-14 DIAGNOSIS — I4891 Unspecified atrial fibrillation: Secondary | ICD-10-CM | POA: Diagnosis not present

## 2022-02-14 DIAGNOSIS — N390 Urinary tract infection, site not specified: Secondary | ICD-10-CM | POA: Diagnosis not present

## 2022-02-14 DIAGNOSIS — E559 Vitamin D deficiency, unspecified: Secondary | ICD-10-CM | POA: Diagnosis not present

## 2022-02-14 DIAGNOSIS — C712 Malignant neoplasm of temporal lobe: Secondary | ICD-10-CM | POA: Diagnosis not present

## 2022-02-14 DIAGNOSIS — E785 Hyperlipidemia, unspecified: Secondary | ICD-10-CM | POA: Diagnosis not present

## 2022-02-15 DIAGNOSIS — D61818 Other pancytopenia: Secondary | ICD-10-CM | POA: Diagnosis not present

## 2022-02-15 DIAGNOSIS — R4182 Altered mental status, unspecified: Secondary | ICD-10-CM | POA: Diagnosis not present

## 2022-02-15 DIAGNOSIS — I4891 Unspecified atrial fibrillation: Secondary | ICD-10-CM | POA: Diagnosis not present

## 2022-02-15 DIAGNOSIS — K219 Gastro-esophageal reflux disease without esophagitis: Secondary | ICD-10-CM | POA: Diagnosis not present

## 2022-02-15 DIAGNOSIS — C712 Malignant neoplasm of temporal lobe: Secondary | ICD-10-CM | POA: Diagnosis not present

## 2022-02-15 DIAGNOSIS — E785 Hyperlipidemia, unspecified: Secondary | ICD-10-CM | POA: Diagnosis not present

## 2022-02-15 DIAGNOSIS — I10 Essential (primary) hypertension: Secondary | ICD-10-CM | POA: Diagnosis not present

## 2022-02-15 DIAGNOSIS — E559 Vitamin D deficiency, unspecified: Secondary | ICD-10-CM | POA: Diagnosis not present

## 2022-02-15 DIAGNOSIS — N390 Urinary tract infection, site not specified: Secondary | ICD-10-CM | POA: Diagnosis not present

## 2022-02-16 DIAGNOSIS — E785 Hyperlipidemia, unspecified: Secondary | ICD-10-CM | POA: Diagnosis not present

## 2022-02-16 DIAGNOSIS — C712 Malignant neoplasm of temporal lobe: Secondary | ICD-10-CM | POA: Diagnosis not present

## 2022-02-16 DIAGNOSIS — R4182 Altered mental status, unspecified: Secondary | ICD-10-CM | POA: Diagnosis not present

## 2022-02-16 DIAGNOSIS — I10 Essential (primary) hypertension: Secondary | ICD-10-CM | POA: Diagnosis not present

## 2022-02-16 DIAGNOSIS — I4891 Unspecified atrial fibrillation: Secondary | ICD-10-CM | POA: Diagnosis not present

## 2022-02-16 DIAGNOSIS — K219 Gastro-esophageal reflux disease without esophagitis: Secondary | ICD-10-CM | POA: Diagnosis not present

## 2022-02-16 DIAGNOSIS — D61818 Other pancytopenia: Secondary | ICD-10-CM | POA: Diagnosis not present

## 2022-02-16 DIAGNOSIS — N390 Urinary tract infection, site not specified: Secondary | ICD-10-CM | POA: Diagnosis not present

## 2022-02-16 DIAGNOSIS — E559 Vitamin D deficiency, unspecified: Secondary | ICD-10-CM | POA: Diagnosis not present

## 2022-02-17 DIAGNOSIS — D61818 Other pancytopenia: Secondary | ICD-10-CM | POA: Diagnosis not present

## 2022-02-17 DIAGNOSIS — K219 Gastro-esophageal reflux disease without esophagitis: Secondary | ICD-10-CM | POA: Diagnosis not present

## 2022-02-17 DIAGNOSIS — C712 Malignant neoplasm of temporal lobe: Secondary | ICD-10-CM | POA: Diagnosis not present

## 2022-02-17 DIAGNOSIS — E559 Vitamin D deficiency, unspecified: Secondary | ICD-10-CM | POA: Diagnosis not present

## 2022-02-17 DIAGNOSIS — I10 Essential (primary) hypertension: Secondary | ICD-10-CM | POA: Diagnosis not present

## 2022-02-17 DIAGNOSIS — R4182 Altered mental status, unspecified: Secondary | ICD-10-CM | POA: Diagnosis not present

## 2022-02-17 DIAGNOSIS — I4891 Unspecified atrial fibrillation: Secondary | ICD-10-CM | POA: Diagnosis not present

## 2022-02-17 DIAGNOSIS — N390 Urinary tract infection, site not specified: Secondary | ICD-10-CM | POA: Diagnosis not present

## 2022-02-17 DIAGNOSIS — E785 Hyperlipidemia, unspecified: Secondary | ICD-10-CM | POA: Diagnosis not present

## 2022-02-18 DIAGNOSIS — I10 Essential (primary) hypertension: Secondary | ICD-10-CM | POA: Diagnosis not present

## 2022-02-18 DIAGNOSIS — E785 Hyperlipidemia, unspecified: Secondary | ICD-10-CM | POA: Diagnosis not present

## 2022-02-18 DIAGNOSIS — I4891 Unspecified atrial fibrillation: Secondary | ICD-10-CM | POA: Diagnosis not present

## 2022-02-18 DIAGNOSIS — D61818 Other pancytopenia: Secondary | ICD-10-CM | POA: Diagnosis not present

## 2022-02-18 DIAGNOSIS — N390 Urinary tract infection, site not specified: Secondary | ICD-10-CM | POA: Diagnosis not present

## 2022-02-18 DIAGNOSIS — C712 Malignant neoplasm of temporal lobe: Secondary | ICD-10-CM | POA: Diagnosis not present

## 2022-02-18 DIAGNOSIS — K219 Gastro-esophageal reflux disease without esophagitis: Secondary | ICD-10-CM | POA: Diagnosis not present

## 2022-02-18 DIAGNOSIS — R4182 Altered mental status, unspecified: Secondary | ICD-10-CM | POA: Diagnosis not present

## 2022-02-18 DIAGNOSIS — E559 Vitamin D deficiency, unspecified: Secondary | ICD-10-CM | POA: Diagnosis not present

## 2022-02-19 DIAGNOSIS — I10 Essential (primary) hypertension: Secondary | ICD-10-CM | POA: Diagnosis not present

## 2022-02-19 DIAGNOSIS — D61818 Other pancytopenia: Secondary | ICD-10-CM | POA: Diagnosis not present

## 2022-02-19 DIAGNOSIS — N390 Urinary tract infection, site not specified: Secondary | ICD-10-CM | POA: Diagnosis not present

## 2022-02-19 DIAGNOSIS — C712 Malignant neoplasm of temporal lobe: Secondary | ICD-10-CM | POA: Diagnosis not present

## 2022-02-19 DIAGNOSIS — K219 Gastro-esophageal reflux disease without esophagitis: Secondary | ICD-10-CM | POA: Diagnosis not present

## 2022-02-19 DIAGNOSIS — I4891 Unspecified atrial fibrillation: Secondary | ICD-10-CM | POA: Diagnosis not present

## 2022-02-19 DIAGNOSIS — E559 Vitamin D deficiency, unspecified: Secondary | ICD-10-CM | POA: Diagnosis not present

## 2022-02-19 DIAGNOSIS — E785 Hyperlipidemia, unspecified: Secondary | ICD-10-CM | POA: Diagnosis not present

## 2022-02-19 DIAGNOSIS — R4182 Altered mental status, unspecified: Secondary | ICD-10-CM | POA: Diagnosis not present

## 2022-02-20 DIAGNOSIS — I4891 Unspecified atrial fibrillation: Secondary | ICD-10-CM | POA: Diagnosis not present

## 2022-02-20 DIAGNOSIS — K219 Gastro-esophageal reflux disease without esophagitis: Secondary | ICD-10-CM | POA: Diagnosis not present

## 2022-02-20 DIAGNOSIS — E785 Hyperlipidemia, unspecified: Secondary | ICD-10-CM | POA: Diagnosis not present

## 2022-02-20 DIAGNOSIS — C712 Malignant neoplasm of temporal lobe: Secondary | ICD-10-CM | POA: Diagnosis not present

## 2022-02-20 DIAGNOSIS — N390 Urinary tract infection, site not specified: Secondary | ICD-10-CM | POA: Diagnosis not present

## 2022-02-20 DIAGNOSIS — I10 Essential (primary) hypertension: Secondary | ICD-10-CM | POA: Diagnosis not present

## 2022-02-20 DIAGNOSIS — R4182 Altered mental status, unspecified: Secondary | ICD-10-CM | POA: Diagnosis not present

## 2022-02-20 DIAGNOSIS — D61818 Other pancytopenia: Secondary | ICD-10-CM | POA: Diagnosis not present

## 2022-02-20 DIAGNOSIS — E559 Vitamin D deficiency, unspecified: Secondary | ICD-10-CM | POA: Diagnosis not present

## 2022-02-21 DIAGNOSIS — C712 Malignant neoplasm of temporal lobe: Secondary | ICD-10-CM | POA: Diagnosis not present

## 2022-02-21 DIAGNOSIS — E559 Vitamin D deficiency, unspecified: Secondary | ICD-10-CM | POA: Diagnosis not present

## 2022-02-21 DIAGNOSIS — E785 Hyperlipidemia, unspecified: Secondary | ICD-10-CM | POA: Diagnosis not present

## 2022-02-21 DIAGNOSIS — N390 Urinary tract infection, site not specified: Secondary | ICD-10-CM | POA: Diagnosis not present

## 2022-02-21 DIAGNOSIS — R4182 Altered mental status, unspecified: Secondary | ICD-10-CM | POA: Diagnosis not present

## 2022-02-21 DIAGNOSIS — K219 Gastro-esophageal reflux disease without esophagitis: Secondary | ICD-10-CM | POA: Diagnosis not present

## 2022-02-21 DIAGNOSIS — I4891 Unspecified atrial fibrillation: Secondary | ICD-10-CM | POA: Diagnosis not present

## 2022-02-21 DIAGNOSIS — I10 Essential (primary) hypertension: Secondary | ICD-10-CM | POA: Diagnosis not present

## 2022-02-21 DIAGNOSIS — D61818 Other pancytopenia: Secondary | ICD-10-CM | POA: Diagnosis not present

## 2022-02-22 DIAGNOSIS — D61818 Other pancytopenia: Secondary | ICD-10-CM | POA: Diagnosis not present

## 2022-02-22 DIAGNOSIS — R4182 Altered mental status, unspecified: Secondary | ICD-10-CM | POA: Diagnosis not present

## 2022-02-22 DIAGNOSIS — N390 Urinary tract infection, site not specified: Secondary | ICD-10-CM | POA: Diagnosis not present

## 2022-02-22 DIAGNOSIS — E559 Vitamin D deficiency, unspecified: Secondary | ICD-10-CM | POA: Diagnosis not present

## 2022-02-22 DIAGNOSIS — K219 Gastro-esophageal reflux disease without esophagitis: Secondary | ICD-10-CM | POA: Diagnosis not present

## 2022-02-22 DIAGNOSIS — C712 Malignant neoplasm of temporal lobe: Secondary | ICD-10-CM | POA: Diagnosis not present

## 2022-02-22 DIAGNOSIS — I4891 Unspecified atrial fibrillation: Secondary | ICD-10-CM | POA: Diagnosis not present

## 2022-02-22 DIAGNOSIS — E785 Hyperlipidemia, unspecified: Secondary | ICD-10-CM | POA: Diagnosis not present

## 2022-02-22 DIAGNOSIS — I10 Essential (primary) hypertension: Secondary | ICD-10-CM | POA: Diagnosis not present

## 2022-02-23 DIAGNOSIS — K219 Gastro-esophageal reflux disease without esophagitis: Secondary | ICD-10-CM | POA: Diagnosis not present

## 2022-02-23 DIAGNOSIS — E785 Hyperlipidemia, unspecified: Secondary | ICD-10-CM | POA: Diagnosis not present

## 2022-02-23 DIAGNOSIS — N390 Urinary tract infection, site not specified: Secondary | ICD-10-CM | POA: Diagnosis not present

## 2022-02-23 DIAGNOSIS — E559 Vitamin D deficiency, unspecified: Secondary | ICD-10-CM | POA: Diagnosis not present

## 2022-02-23 DIAGNOSIS — C712 Malignant neoplasm of temporal lobe: Secondary | ICD-10-CM | POA: Diagnosis not present

## 2022-02-23 DIAGNOSIS — R4182 Altered mental status, unspecified: Secondary | ICD-10-CM | POA: Diagnosis not present

## 2022-02-23 DIAGNOSIS — I4891 Unspecified atrial fibrillation: Secondary | ICD-10-CM | POA: Diagnosis not present

## 2022-02-23 DIAGNOSIS — I10 Essential (primary) hypertension: Secondary | ICD-10-CM | POA: Diagnosis not present

## 2022-02-23 DIAGNOSIS — D61818 Other pancytopenia: Secondary | ICD-10-CM | POA: Diagnosis not present

## 2022-02-24 DIAGNOSIS — I4891 Unspecified atrial fibrillation: Secondary | ICD-10-CM | POA: Diagnosis not present

## 2022-02-24 DIAGNOSIS — K219 Gastro-esophageal reflux disease without esophagitis: Secondary | ICD-10-CM | POA: Diagnosis not present

## 2022-02-24 DIAGNOSIS — R4182 Altered mental status, unspecified: Secondary | ICD-10-CM | POA: Diagnosis not present

## 2022-02-24 DIAGNOSIS — D61818 Other pancytopenia: Secondary | ICD-10-CM | POA: Diagnosis not present

## 2022-02-24 DIAGNOSIS — E785 Hyperlipidemia, unspecified: Secondary | ICD-10-CM | POA: Diagnosis not present

## 2022-02-24 DIAGNOSIS — I10 Essential (primary) hypertension: Secondary | ICD-10-CM | POA: Diagnosis not present

## 2022-02-24 DIAGNOSIS — N390 Urinary tract infection, site not specified: Secondary | ICD-10-CM | POA: Diagnosis not present

## 2022-02-24 DIAGNOSIS — C712 Malignant neoplasm of temporal lobe: Secondary | ICD-10-CM | POA: Diagnosis not present

## 2022-02-24 DIAGNOSIS — E559 Vitamin D deficiency, unspecified: Secondary | ICD-10-CM | POA: Diagnosis not present

## 2022-02-25 DIAGNOSIS — K219 Gastro-esophageal reflux disease without esophagitis: Secondary | ICD-10-CM | POA: Diagnosis not present

## 2022-02-25 DIAGNOSIS — E785 Hyperlipidemia, unspecified: Secondary | ICD-10-CM | POA: Diagnosis not present

## 2022-02-25 DIAGNOSIS — E559 Vitamin D deficiency, unspecified: Secondary | ICD-10-CM | POA: Diagnosis not present

## 2022-02-25 DIAGNOSIS — R4182 Altered mental status, unspecified: Secondary | ICD-10-CM | POA: Diagnosis not present

## 2022-02-25 DIAGNOSIS — D61818 Other pancytopenia: Secondary | ICD-10-CM | POA: Diagnosis not present

## 2022-02-25 DIAGNOSIS — C712 Malignant neoplasm of temporal lobe: Secondary | ICD-10-CM | POA: Diagnosis not present

## 2022-02-25 DIAGNOSIS — I4891 Unspecified atrial fibrillation: Secondary | ICD-10-CM | POA: Diagnosis not present

## 2022-02-25 DIAGNOSIS — N390 Urinary tract infection, site not specified: Secondary | ICD-10-CM | POA: Diagnosis not present

## 2022-02-25 DIAGNOSIS — I10 Essential (primary) hypertension: Secondary | ICD-10-CM | POA: Diagnosis not present

## 2022-02-26 DIAGNOSIS — I10 Essential (primary) hypertension: Secondary | ICD-10-CM | POA: Diagnosis not present

## 2022-02-26 DIAGNOSIS — E785 Hyperlipidemia, unspecified: Secondary | ICD-10-CM | POA: Diagnosis not present

## 2022-02-26 DIAGNOSIS — E559 Vitamin D deficiency, unspecified: Secondary | ICD-10-CM | POA: Diagnosis not present

## 2022-02-26 DIAGNOSIS — D61818 Other pancytopenia: Secondary | ICD-10-CM | POA: Diagnosis not present

## 2022-02-26 DIAGNOSIS — K219 Gastro-esophageal reflux disease without esophagitis: Secondary | ICD-10-CM | POA: Diagnosis not present

## 2022-02-26 DIAGNOSIS — C712 Malignant neoplasm of temporal lobe: Secondary | ICD-10-CM | POA: Diagnosis not present

## 2022-02-26 DIAGNOSIS — I4891 Unspecified atrial fibrillation: Secondary | ICD-10-CM | POA: Diagnosis not present

## 2022-02-26 DIAGNOSIS — R4182 Altered mental status, unspecified: Secondary | ICD-10-CM | POA: Diagnosis not present

## 2022-02-26 DIAGNOSIS — N390 Urinary tract infection, site not specified: Secondary | ICD-10-CM | POA: Diagnosis not present

## 2022-02-27 DIAGNOSIS — R4182 Altered mental status, unspecified: Secondary | ICD-10-CM | POA: Diagnosis not present

## 2022-02-27 DIAGNOSIS — I10 Essential (primary) hypertension: Secondary | ICD-10-CM | POA: Diagnosis not present

## 2022-02-27 DIAGNOSIS — E785 Hyperlipidemia, unspecified: Secondary | ICD-10-CM | POA: Diagnosis not present

## 2022-02-27 DIAGNOSIS — K219 Gastro-esophageal reflux disease without esophagitis: Secondary | ICD-10-CM | POA: Diagnosis not present

## 2022-02-27 DIAGNOSIS — D61818 Other pancytopenia: Secondary | ICD-10-CM | POA: Diagnosis not present

## 2022-02-27 DIAGNOSIS — N390 Urinary tract infection, site not specified: Secondary | ICD-10-CM | POA: Diagnosis not present

## 2022-02-27 DIAGNOSIS — C712 Malignant neoplasm of temporal lobe: Secondary | ICD-10-CM | POA: Diagnosis not present

## 2022-02-27 DIAGNOSIS — I4891 Unspecified atrial fibrillation: Secondary | ICD-10-CM | POA: Diagnosis not present

## 2022-02-27 DIAGNOSIS — E559 Vitamin D deficiency, unspecified: Secondary | ICD-10-CM | POA: Diagnosis not present

## 2022-02-28 DIAGNOSIS — R4182 Altered mental status, unspecified: Secondary | ICD-10-CM | POA: Diagnosis not present

## 2022-02-28 DIAGNOSIS — C712 Malignant neoplasm of temporal lobe: Secondary | ICD-10-CM | POA: Diagnosis not present

## 2022-02-28 DIAGNOSIS — N390 Urinary tract infection, site not specified: Secondary | ICD-10-CM | POA: Diagnosis not present

## 2022-02-28 DIAGNOSIS — E785 Hyperlipidemia, unspecified: Secondary | ICD-10-CM | POA: Diagnosis not present

## 2022-02-28 DIAGNOSIS — D61818 Other pancytopenia: Secondary | ICD-10-CM | POA: Diagnosis not present

## 2022-02-28 DIAGNOSIS — E559 Vitamin D deficiency, unspecified: Secondary | ICD-10-CM | POA: Diagnosis not present

## 2022-02-28 DIAGNOSIS — I10 Essential (primary) hypertension: Secondary | ICD-10-CM | POA: Diagnosis not present

## 2022-02-28 DIAGNOSIS — I4891 Unspecified atrial fibrillation: Secondary | ICD-10-CM | POA: Diagnosis not present

## 2022-02-28 DIAGNOSIS — K219 Gastro-esophageal reflux disease without esophagitis: Secondary | ICD-10-CM | POA: Diagnosis not present

## 2022-03-01 DIAGNOSIS — R4182 Altered mental status, unspecified: Secondary | ICD-10-CM | POA: Diagnosis not present

## 2022-03-01 DIAGNOSIS — N390 Urinary tract infection, site not specified: Secondary | ICD-10-CM | POA: Diagnosis not present

## 2022-03-01 DIAGNOSIS — E785 Hyperlipidemia, unspecified: Secondary | ICD-10-CM | POA: Diagnosis not present

## 2022-03-01 DIAGNOSIS — I4891 Unspecified atrial fibrillation: Secondary | ICD-10-CM | POA: Diagnosis not present

## 2022-03-01 DIAGNOSIS — C712 Malignant neoplasm of temporal lobe: Secondary | ICD-10-CM | POA: Diagnosis not present

## 2022-03-01 DIAGNOSIS — D61818 Other pancytopenia: Secondary | ICD-10-CM | POA: Diagnosis not present

## 2022-03-01 DIAGNOSIS — K219 Gastro-esophageal reflux disease without esophagitis: Secondary | ICD-10-CM | POA: Diagnosis not present

## 2022-03-01 DIAGNOSIS — I10 Essential (primary) hypertension: Secondary | ICD-10-CM | POA: Diagnosis not present

## 2022-03-01 DIAGNOSIS — E559 Vitamin D deficiency, unspecified: Secondary | ICD-10-CM | POA: Diagnosis not present

## 2022-03-02 DIAGNOSIS — E785 Hyperlipidemia, unspecified: Secondary | ICD-10-CM | POA: Diagnosis not present

## 2022-03-02 DIAGNOSIS — I4891 Unspecified atrial fibrillation: Secondary | ICD-10-CM | POA: Diagnosis not present

## 2022-03-02 DIAGNOSIS — R4182 Altered mental status, unspecified: Secondary | ICD-10-CM | POA: Diagnosis not present

## 2022-03-02 DIAGNOSIS — C712 Malignant neoplasm of temporal lobe: Secondary | ICD-10-CM | POA: Diagnosis not present

## 2022-03-02 DIAGNOSIS — I10 Essential (primary) hypertension: Secondary | ICD-10-CM | POA: Diagnosis not present

## 2022-03-02 DIAGNOSIS — D61818 Other pancytopenia: Secondary | ICD-10-CM | POA: Diagnosis not present

## 2022-03-02 DIAGNOSIS — N390 Urinary tract infection, site not specified: Secondary | ICD-10-CM | POA: Diagnosis not present

## 2022-03-02 DIAGNOSIS — E559 Vitamin D deficiency, unspecified: Secondary | ICD-10-CM | POA: Diagnosis not present

## 2022-03-02 DIAGNOSIS — K219 Gastro-esophageal reflux disease without esophagitis: Secondary | ICD-10-CM | POA: Diagnosis not present

## 2022-03-03 DIAGNOSIS — E785 Hyperlipidemia, unspecified: Secondary | ICD-10-CM | POA: Diagnosis not present

## 2022-03-03 DIAGNOSIS — N390 Urinary tract infection, site not specified: Secondary | ICD-10-CM | POA: Diagnosis not present

## 2022-03-03 DIAGNOSIS — E559 Vitamin D deficiency, unspecified: Secondary | ICD-10-CM | POA: Diagnosis not present

## 2022-03-03 DIAGNOSIS — C712 Malignant neoplasm of temporal lobe: Secondary | ICD-10-CM | POA: Diagnosis not present

## 2022-03-03 DIAGNOSIS — R4182 Altered mental status, unspecified: Secondary | ICD-10-CM | POA: Diagnosis not present

## 2022-03-03 DIAGNOSIS — I4891 Unspecified atrial fibrillation: Secondary | ICD-10-CM | POA: Diagnosis not present

## 2022-03-03 DIAGNOSIS — K219 Gastro-esophageal reflux disease without esophagitis: Secondary | ICD-10-CM | POA: Diagnosis not present

## 2022-03-03 DIAGNOSIS — D61818 Other pancytopenia: Secondary | ICD-10-CM | POA: Diagnosis not present

## 2022-03-03 DIAGNOSIS — I10 Essential (primary) hypertension: Secondary | ICD-10-CM | POA: Diagnosis not present

## 2022-03-04 DIAGNOSIS — D61818 Other pancytopenia: Secondary | ICD-10-CM | POA: Diagnosis not present

## 2022-03-04 DIAGNOSIS — K219 Gastro-esophageal reflux disease without esophagitis: Secondary | ICD-10-CM | POA: Diagnosis not present

## 2022-03-04 DIAGNOSIS — E785 Hyperlipidemia, unspecified: Secondary | ICD-10-CM | POA: Diagnosis not present

## 2022-03-04 DIAGNOSIS — I4891 Unspecified atrial fibrillation: Secondary | ICD-10-CM | POA: Diagnosis not present

## 2022-03-04 DIAGNOSIS — C712 Malignant neoplasm of temporal lobe: Secondary | ICD-10-CM | POA: Diagnosis not present

## 2022-03-04 DIAGNOSIS — I10 Essential (primary) hypertension: Secondary | ICD-10-CM | POA: Diagnosis not present

## 2022-03-04 DIAGNOSIS — E559 Vitamin D deficiency, unspecified: Secondary | ICD-10-CM | POA: Diagnosis not present

## 2022-03-04 DIAGNOSIS — N390 Urinary tract infection, site not specified: Secondary | ICD-10-CM | POA: Diagnosis not present

## 2022-03-04 DIAGNOSIS — R4182 Altered mental status, unspecified: Secondary | ICD-10-CM | POA: Diagnosis not present

## 2022-03-05 DIAGNOSIS — E785 Hyperlipidemia, unspecified: Secondary | ICD-10-CM | POA: Diagnosis not present

## 2022-03-05 DIAGNOSIS — N390 Urinary tract infection, site not specified: Secondary | ICD-10-CM | POA: Diagnosis not present

## 2022-03-05 DIAGNOSIS — I10 Essential (primary) hypertension: Secondary | ICD-10-CM | POA: Diagnosis not present

## 2022-03-05 DIAGNOSIS — K219 Gastro-esophageal reflux disease without esophagitis: Secondary | ICD-10-CM | POA: Diagnosis not present

## 2022-03-05 DIAGNOSIS — E559 Vitamin D deficiency, unspecified: Secondary | ICD-10-CM | POA: Diagnosis not present

## 2022-03-05 DIAGNOSIS — I4891 Unspecified atrial fibrillation: Secondary | ICD-10-CM | POA: Diagnosis not present

## 2022-03-05 DIAGNOSIS — R4182 Altered mental status, unspecified: Secondary | ICD-10-CM | POA: Diagnosis not present

## 2022-03-05 DIAGNOSIS — C712 Malignant neoplasm of temporal lobe: Secondary | ICD-10-CM | POA: Diagnosis not present

## 2022-03-05 DIAGNOSIS — D61818 Other pancytopenia: Secondary | ICD-10-CM | POA: Diagnosis not present

## 2022-03-06 DIAGNOSIS — R4182 Altered mental status, unspecified: Secondary | ICD-10-CM | POA: Diagnosis not present

## 2022-03-06 DIAGNOSIS — N390 Urinary tract infection, site not specified: Secondary | ICD-10-CM | POA: Diagnosis not present

## 2022-03-06 DIAGNOSIS — D61818 Other pancytopenia: Secondary | ICD-10-CM | POA: Diagnosis not present

## 2022-03-06 DIAGNOSIS — E559 Vitamin D deficiency, unspecified: Secondary | ICD-10-CM | POA: Diagnosis not present

## 2022-03-06 DIAGNOSIS — E785 Hyperlipidemia, unspecified: Secondary | ICD-10-CM | POA: Diagnosis not present

## 2022-03-06 DIAGNOSIS — C712 Malignant neoplasm of temporal lobe: Secondary | ICD-10-CM | POA: Diagnosis not present

## 2022-03-06 DIAGNOSIS — I10 Essential (primary) hypertension: Secondary | ICD-10-CM | POA: Diagnosis not present

## 2022-03-06 DIAGNOSIS — I4891 Unspecified atrial fibrillation: Secondary | ICD-10-CM | POA: Diagnosis not present

## 2022-03-06 DIAGNOSIS — K219 Gastro-esophageal reflux disease without esophagitis: Secondary | ICD-10-CM | POA: Diagnosis not present

## 2022-03-07 DIAGNOSIS — R4182 Altered mental status, unspecified: Secondary | ICD-10-CM | POA: Diagnosis not present

## 2022-03-07 DIAGNOSIS — E559 Vitamin D deficiency, unspecified: Secondary | ICD-10-CM | POA: Diagnosis not present

## 2022-03-07 DIAGNOSIS — E785 Hyperlipidemia, unspecified: Secondary | ICD-10-CM | POA: Diagnosis not present

## 2022-03-07 DIAGNOSIS — N390 Urinary tract infection, site not specified: Secondary | ICD-10-CM | POA: Diagnosis not present

## 2022-03-07 DIAGNOSIS — I10 Essential (primary) hypertension: Secondary | ICD-10-CM | POA: Diagnosis not present

## 2022-03-07 DIAGNOSIS — I4891 Unspecified atrial fibrillation: Secondary | ICD-10-CM | POA: Diagnosis not present

## 2022-03-07 DIAGNOSIS — D61818 Other pancytopenia: Secondary | ICD-10-CM | POA: Diagnosis not present

## 2022-03-07 DIAGNOSIS — K219 Gastro-esophageal reflux disease without esophagitis: Secondary | ICD-10-CM | POA: Diagnosis not present

## 2022-03-07 DIAGNOSIS — C712 Malignant neoplasm of temporal lobe: Secondary | ICD-10-CM | POA: Diagnosis not present

## 2022-03-08 DIAGNOSIS — E559 Vitamin D deficiency, unspecified: Secondary | ICD-10-CM | POA: Diagnosis not present

## 2022-03-08 DIAGNOSIS — N390 Urinary tract infection, site not specified: Secondary | ICD-10-CM | POA: Diagnosis not present

## 2022-03-08 DIAGNOSIS — R4182 Altered mental status, unspecified: Secondary | ICD-10-CM | POA: Diagnosis not present

## 2022-03-08 DIAGNOSIS — I4891 Unspecified atrial fibrillation: Secondary | ICD-10-CM | POA: Diagnosis not present

## 2022-03-08 DIAGNOSIS — E785 Hyperlipidemia, unspecified: Secondary | ICD-10-CM | POA: Diagnosis not present

## 2022-03-08 DIAGNOSIS — D61818 Other pancytopenia: Secondary | ICD-10-CM | POA: Diagnosis not present

## 2022-03-08 DIAGNOSIS — C712 Malignant neoplasm of temporal lobe: Secondary | ICD-10-CM | POA: Diagnosis not present

## 2022-03-08 DIAGNOSIS — K219 Gastro-esophageal reflux disease without esophagitis: Secondary | ICD-10-CM | POA: Diagnosis not present

## 2022-03-08 DIAGNOSIS — I10 Essential (primary) hypertension: Secondary | ICD-10-CM | POA: Diagnosis not present

## 2022-03-09 DIAGNOSIS — E785 Hyperlipidemia, unspecified: Secondary | ICD-10-CM | POA: Diagnosis not present

## 2022-03-09 DIAGNOSIS — E559 Vitamin D deficiency, unspecified: Secondary | ICD-10-CM | POA: Diagnosis not present

## 2022-03-09 DIAGNOSIS — R4182 Altered mental status, unspecified: Secondary | ICD-10-CM | POA: Diagnosis not present

## 2022-03-09 DIAGNOSIS — N390 Urinary tract infection, site not specified: Secondary | ICD-10-CM | POA: Diagnosis not present

## 2022-03-09 DIAGNOSIS — C712 Malignant neoplasm of temporal lobe: Secondary | ICD-10-CM | POA: Diagnosis not present

## 2022-03-09 DIAGNOSIS — I10 Essential (primary) hypertension: Secondary | ICD-10-CM | POA: Diagnosis not present

## 2022-03-09 DIAGNOSIS — K219 Gastro-esophageal reflux disease without esophagitis: Secondary | ICD-10-CM | POA: Diagnosis not present

## 2022-03-09 DIAGNOSIS — I4891 Unspecified atrial fibrillation: Secondary | ICD-10-CM | POA: Diagnosis not present

## 2022-03-09 DIAGNOSIS — D61818 Other pancytopenia: Secondary | ICD-10-CM | POA: Diagnosis not present

## 2022-03-10 DIAGNOSIS — I10 Essential (primary) hypertension: Secondary | ICD-10-CM | POA: Diagnosis not present

## 2022-03-10 DIAGNOSIS — N390 Urinary tract infection, site not specified: Secondary | ICD-10-CM | POA: Diagnosis not present

## 2022-03-10 DIAGNOSIS — C712 Malignant neoplasm of temporal lobe: Secondary | ICD-10-CM | POA: Diagnosis not present

## 2022-03-10 DIAGNOSIS — I4891 Unspecified atrial fibrillation: Secondary | ICD-10-CM | POA: Diagnosis not present

## 2022-03-10 DIAGNOSIS — K219 Gastro-esophageal reflux disease without esophagitis: Secondary | ICD-10-CM | POA: Diagnosis not present

## 2022-03-10 DIAGNOSIS — D61818 Other pancytopenia: Secondary | ICD-10-CM | POA: Diagnosis not present

## 2022-03-10 DIAGNOSIS — E785 Hyperlipidemia, unspecified: Secondary | ICD-10-CM | POA: Diagnosis not present

## 2022-03-10 DIAGNOSIS — R4182 Altered mental status, unspecified: Secondary | ICD-10-CM | POA: Diagnosis not present

## 2022-03-10 DIAGNOSIS — E559 Vitamin D deficiency, unspecified: Secondary | ICD-10-CM | POA: Diagnosis not present

## 2022-03-11 DIAGNOSIS — K219 Gastro-esophageal reflux disease without esophagitis: Secondary | ICD-10-CM | POA: Diagnosis not present

## 2022-03-11 DIAGNOSIS — I4891 Unspecified atrial fibrillation: Secondary | ICD-10-CM | POA: Diagnosis not present

## 2022-03-11 DIAGNOSIS — I10 Essential (primary) hypertension: Secondary | ICD-10-CM | POA: Diagnosis not present

## 2022-03-11 DIAGNOSIS — C712 Malignant neoplasm of temporal lobe: Secondary | ICD-10-CM | POA: Diagnosis not present

## 2022-03-11 DIAGNOSIS — R4182 Altered mental status, unspecified: Secondary | ICD-10-CM | POA: Diagnosis not present

## 2022-03-11 DIAGNOSIS — E785 Hyperlipidemia, unspecified: Secondary | ICD-10-CM | POA: Diagnosis not present

## 2022-03-11 DIAGNOSIS — D61818 Other pancytopenia: Secondary | ICD-10-CM | POA: Diagnosis not present

## 2022-03-11 DIAGNOSIS — E559 Vitamin D deficiency, unspecified: Secondary | ICD-10-CM | POA: Diagnosis not present

## 2022-03-11 DIAGNOSIS — N390 Urinary tract infection, site not specified: Secondary | ICD-10-CM | POA: Diagnosis not present

## 2022-03-12 DIAGNOSIS — K219 Gastro-esophageal reflux disease without esophagitis: Secondary | ICD-10-CM | POA: Diagnosis not present

## 2022-03-12 DIAGNOSIS — N390 Urinary tract infection, site not specified: Secondary | ICD-10-CM | POA: Diagnosis not present

## 2022-03-12 DIAGNOSIS — R4182 Altered mental status, unspecified: Secondary | ICD-10-CM | POA: Diagnosis not present

## 2022-03-12 DIAGNOSIS — E785 Hyperlipidemia, unspecified: Secondary | ICD-10-CM | POA: Diagnosis not present

## 2022-03-12 DIAGNOSIS — I4891 Unspecified atrial fibrillation: Secondary | ICD-10-CM | POA: Diagnosis not present

## 2022-03-12 DIAGNOSIS — E559 Vitamin D deficiency, unspecified: Secondary | ICD-10-CM | POA: Diagnosis not present

## 2022-03-12 DIAGNOSIS — I10 Essential (primary) hypertension: Secondary | ICD-10-CM | POA: Diagnosis not present

## 2022-03-12 DIAGNOSIS — D61818 Other pancytopenia: Secondary | ICD-10-CM | POA: Diagnosis not present

## 2022-03-12 DIAGNOSIS — C712 Malignant neoplasm of temporal lobe: Secondary | ICD-10-CM | POA: Diagnosis not present

## 2022-03-13 DIAGNOSIS — C712 Malignant neoplasm of temporal lobe: Secondary | ICD-10-CM | POA: Diagnosis not present

## 2022-03-13 DIAGNOSIS — E559 Vitamin D deficiency, unspecified: Secondary | ICD-10-CM | POA: Diagnosis not present

## 2022-03-13 DIAGNOSIS — R4182 Altered mental status, unspecified: Secondary | ICD-10-CM | POA: Diagnosis not present

## 2022-03-13 DIAGNOSIS — E785 Hyperlipidemia, unspecified: Secondary | ICD-10-CM | POA: Diagnosis not present

## 2022-03-13 DIAGNOSIS — N390 Urinary tract infection, site not specified: Secondary | ICD-10-CM | POA: Diagnosis not present

## 2022-03-13 DIAGNOSIS — K219 Gastro-esophageal reflux disease without esophagitis: Secondary | ICD-10-CM | POA: Diagnosis not present

## 2022-03-13 DIAGNOSIS — D61818 Other pancytopenia: Secondary | ICD-10-CM | POA: Diagnosis not present

## 2022-03-13 DIAGNOSIS — I10 Essential (primary) hypertension: Secondary | ICD-10-CM | POA: Diagnosis not present

## 2022-03-13 DIAGNOSIS — I4891 Unspecified atrial fibrillation: Secondary | ICD-10-CM | POA: Diagnosis not present

## 2022-03-14 DIAGNOSIS — R4182 Altered mental status, unspecified: Secondary | ICD-10-CM | POA: Diagnosis not present

## 2022-03-14 DIAGNOSIS — N390 Urinary tract infection, site not specified: Secondary | ICD-10-CM | POA: Diagnosis not present

## 2022-03-14 DIAGNOSIS — D61818 Other pancytopenia: Secondary | ICD-10-CM | POA: Diagnosis not present

## 2022-03-14 DIAGNOSIS — I4891 Unspecified atrial fibrillation: Secondary | ICD-10-CM | POA: Diagnosis not present

## 2022-03-14 DIAGNOSIS — E559 Vitamin D deficiency, unspecified: Secondary | ICD-10-CM | POA: Diagnosis not present

## 2022-03-14 DIAGNOSIS — E785 Hyperlipidemia, unspecified: Secondary | ICD-10-CM | POA: Diagnosis not present

## 2022-03-14 DIAGNOSIS — K219 Gastro-esophageal reflux disease without esophagitis: Secondary | ICD-10-CM | POA: Diagnosis not present

## 2022-03-14 DIAGNOSIS — I10 Essential (primary) hypertension: Secondary | ICD-10-CM | POA: Diagnosis not present

## 2022-03-14 DIAGNOSIS — C712 Malignant neoplasm of temporal lobe: Secondary | ICD-10-CM | POA: Diagnosis not present

## 2022-03-15 DIAGNOSIS — D61818 Other pancytopenia: Secondary | ICD-10-CM | POA: Diagnosis not present

## 2022-03-15 DIAGNOSIS — E559 Vitamin D deficiency, unspecified: Secondary | ICD-10-CM | POA: Diagnosis not present

## 2022-03-15 DIAGNOSIS — E785 Hyperlipidemia, unspecified: Secondary | ICD-10-CM | POA: Diagnosis not present

## 2022-03-15 DIAGNOSIS — I10 Essential (primary) hypertension: Secondary | ICD-10-CM | POA: Diagnosis not present

## 2022-03-15 DIAGNOSIS — R4182 Altered mental status, unspecified: Secondary | ICD-10-CM | POA: Diagnosis not present

## 2022-03-15 DIAGNOSIS — K219 Gastro-esophageal reflux disease without esophagitis: Secondary | ICD-10-CM | POA: Diagnosis not present

## 2022-03-15 DIAGNOSIS — I4891 Unspecified atrial fibrillation: Secondary | ICD-10-CM | POA: Diagnosis not present

## 2022-03-15 DIAGNOSIS — C712 Malignant neoplasm of temporal lobe: Secondary | ICD-10-CM | POA: Diagnosis not present

## 2022-03-15 DIAGNOSIS — N390 Urinary tract infection, site not specified: Secondary | ICD-10-CM | POA: Diagnosis not present

## 2022-03-16 DIAGNOSIS — D61818 Other pancytopenia: Secondary | ICD-10-CM | POA: Diagnosis not present

## 2022-03-16 DIAGNOSIS — I10 Essential (primary) hypertension: Secondary | ICD-10-CM | POA: Diagnosis not present

## 2022-03-16 DIAGNOSIS — N390 Urinary tract infection, site not specified: Secondary | ICD-10-CM | POA: Diagnosis not present

## 2022-03-16 DIAGNOSIS — K219 Gastro-esophageal reflux disease without esophagitis: Secondary | ICD-10-CM | POA: Diagnosis not present

## 2022-03-16 DIAGNOSIS — E559 Vitamin D deficiency, unspecified: Secondary | ICD-10-CM | POA: Diagnosis not present

## 2022-03-16 DIAGNOSIS — I4891 Unspecified atrial fibrillation: Secondary | ICD-10-CM | POA: Diagnosis not present

## 2022-03-16 DIAGNOSIS — R4182 Altered mental status, unspecified: Secondary | ICD-10-CM | POA: Diagnosis not present

## 2022-03-16 DIAGNOSIS — E785 Hyperlipidemia, unspecified: Secondary | ICD-10-CM | POA: Diagnosis not present

## 2022-03-16 DIAGNOSIS — C712 Malignant neoplasm of temporal lobe: Secondary | ICD-10-CM | POA: Diagnosis not present

## 2022-03-17 DIAGNOSIS — E559 Vitamin D deficiency, unspecified: Secondary | ICD-10-CM | POA: Diagnosis not present

## 2022-03-17 DIAGNOSIS — I10 Essential (primary) hypertension: Secondary | ICD-10-CM | POA: Diagnosis not present

## 2022-03-17 DIAGNOSIS — I4891 Unspecified atrial fibrillation: Secondary | ICD-10-CM | POA: Diagnosis not present

## 2022-03-17 DIAGNOSIS — E785 Hyperlipidemia, unspecified: Secondary | ICD-10-CM | POA: Diagnosis not present

## 2022-03-17 DIAGNOSIS — R4182 Altered mental status, unspecified: Secondary | ICD-10-CM | POA: Diagnosis not present

## 2022-03-17 DIAGNOSIS — N390 Urinary tract infection, site not specified: Secondary | ICD-10-CM | POA: Diagnosis not present

## 2022-03-17 DIAGNOSIS — C712 Malignant neoplasm of temporal lobe: Secondary | ICD-10-CM | POA: Diagnosis not present

## 2022-03-17 DIAGNOSIS — D61818 Other pancytopenia: Secondary | ICD-10-CM | POA: Diagnosis not present

## 2022-03-17 DIAGNOSIS — K219 Gastro-esophageal reflux disease without esophagitis: Secondary | ICD-10-CM | POA: Diagnosis not present

## 2022-03-18 DIAGNOSIS — E559 Vitamin D deficiency, unspecified: Secondary | ICD-10-CM | POA: Diagnosis not present

## 2022-03-18 DIAGNOSIS — K219 Gastro-esophageal reflux disease without esophagitis: Secondary | ICD-10-CM | POA: Diagnosis not present

## 2022-03-18 DIAGNOSIS — C712 Malignant neoplasm of temporal lobe: Secondary | ICD-10-CM | POA: Diagnosis not present

## 2022-03-18 DIAGNOSIS — D61818 Other pancytopenia: Secondary | ICD-10-CM | POA: Diagnosis not present

## 2022-03-18 DIAGNOSIS — I4891 Unspecified atrial fibrillation: Secondary | ICD-10-CM | POA: Diagnosis not present

## 2022-03-18 DIAGNOSIS — I10 Essential (primary) hypertension: Secondary | ICD-10-CM | POA: Diagnosis not present

## 2022-03-18 DIAGNOSIS — N390 Urinary tract infection, site not specified: Secondary | ICD-10-CM | POA: Diagnosis not present

## 2022-03-18 DIAGNOSIS — E785 Hyperlipidemia, unspecified: Secondary | ICD-10-CM | POA: Diagnosis not present

## 2022-03-18 DIAGNOSIS — R4182 Altered mental status, unspecified: Secondary | ICD-10-CM | POA: Diagnosis not present

## 2022-03-19 DIAGNOSIS — E785 Hyperlipidemia, unspecified: Secondary | ICD-10-CM | POA: Diagnosis not present

## 2022-03-19 DIAGNOSIS — I4891 Unspecified atrial fibrillation: Secondary | ICD-10-CM | POA: Diagnosis not present

## 2022-03-19 DIAGNOSIS — D61818 Other pancytopenia: Secondary | ICD-10-CM | POA: Diagnosis not present

## 2022-03-19 DIAGNOSIS — K219 Gastro-esophageal reflux disease without esophagitis: Secondary | ICD-10-CM | POA: Diagnosis not present

## 2022-03-19 DIAGNOSIS — E559 Vitamin D deficiency, unspecified: Secondary | ICD-10-CM | POA: Diagnosis not present

## 2022-03-19 DIAGNOSIS — N390 Urinary tract infection, site not specified: Secondary | ICD-10-CM | POA: Diagnosis not present

## 2022-03-19 DIAGNOSIS — I10 Essential (primary) hypertension: Secondary | ICD-10-CM | POA: Diagnosis not present

## 2022-03-19 DIAGNOSIS — R4182 Altered mental status, unspecified: Secondary | ICD-10-CM | POA: Diagnosis not present

## 2022-03-19 DIAGNOSIS — C712 Malignant neoplasm of temporal lobe: Secondary | ICD-10-CM | POA: Diagnosis not present

## 2022-03-20 DIAGNOSIS — I4891 Unspecified atrial fibrillation: Secondary | ICD-10-CM | POA: Diagnosis not present

## 2022-03-20 DIAGNOSIS — E785 Hyperlipidemia, unspecified: Secondary | ICD-10-CM | POA: Diagnosis not present

## 2022-03-20 DIAGNOSIS — C712 Malignant neoplasm of temporal lobe: Secondary | ICD-10-CM | POA: Diagnosis not present

## 2022-03-20 DIAGNOSIS — E559 Vitamin D deficiency, unspecified: Secondary | ICD-10-CM | POA: Diagnosis not present

## 2022-03-20 DIAGNOSIS — N390 Urinary tract infection, site not specified: Secondary | ICD-10-CM | POA: Diagnosis not present

## 2022-03-20 DIAGNOSIS — D61818 Other pancytopenia: Secondary | ICD-10-CM | POA: Diagnosis not present

## 2022-03-20 DIAGNOSIS — K219 Gastro-esophageal reflux disease without esophagitis: Secondary | ICD-10-CM | POA: Diagnosis not present

## 2022-03-20 DIAGNOSIS — I10 Essential (primary) hypertension: Secondary | ICD-10-CM | POA: Diagnosis not present

## 2022-03-20 DIAGNOSIS — R4182 Altered mental status, unspecified: Secondary | ICD-10-CM | POA: Diagnosis not present

## 2022-03-21 DIAGNOSIS — N390 Urinary tract infection, site not specified: Secondary | ICD-10-CM | POA: Diagnosis not present

## 2022-03-21 DIAGNOSIS — C712 Malignant neoplasm of temporal lobe: Secondary | ICD-10-CM | POA: Diagnosis not present

## 2022-03-21 DIAGNOSIS — K219 Gastro-esophageal reflux disease without esophagitis: Secondary | ICD-10-CM | POA: Diagnosis not present

## 2022-03-21 DIAGNOSIS — E559 Vitamin D deficiency, unspecified: Secondary | ICD-10-CM | POA: Diagnosis not present

## 2022-03-21 DIAGNOSIS — I4891 Unspecified atrial fibrillation: Secondary | ICD-10-CM | POA: Diagnosis not present

## 2022-03-21 DIAGNOSIS — I10 Essential (primary) hypertension: Secondary | ICD-10-CM | POA: Diagnosis not present

## 2022-03-21 DIAGNOSIS — E785 Hyperlipidemia, unspecified: Secondary | ICD-10-CM | POA: Diagnosis not present

## 2022-03-21 DIAGNOSIS — D61818 Other pancytopenia: Secondary | ICD-10-CM | POA: Diagnosis not present

## 2022-03-21 DIAGNOSIS — R4182 Altered mental status, unspecified: Secondary | ICD-10-CM | POA: Diagnosis not present

## 2022-03-22 DIAGNOSIS — C712 Malignant neoplasm of temporal lobe: Secondary | ICD-10-CM | POA: Diagnosis not present

## 2022-03-22 DIAGNOSIS — E785 Hyperlipidemia, unspecified: Secondary | ICD-10-CM | POA: Diagnosis not present

## 2022-03-22 DIAGNOSIS — K219 Gastro-esophageal reflux disease without esophagitis: Secondary | ICD-10-CM | POA: Diagnosis not present

## 2022-03-22 DIAGNOSIS — E559 Vitamin D deficiency, unspecified: Secondary | ICD-10-CM | POA: Diagnosis not present

## 2022-03-22 DIAGNOSIS — I10 Essential (primary) hypertension: Secondary | ICD-10-CM | POA: Diagnosis not present

## 2022-03-22 DIAGNOSIS — N390 Urinary tract infection, site not specified: Secondary | ICD-10-CM | POA: Diagnosis not present

## 2022-03-22 DIAGNOSIS — R4182 Altered mental status, unspecified: Secondary | ICD-10-CM | POA: Diagnosis not present

## 2022-03-22 DIAGNOSIS — I4891 Unspecified atrial fibrillation: Secondary | ICD-10-CM | POA: Diagnosis not present

## 2022-03-22 DIAGNOSIS — D61818 Other pancytopenia: Secondary | ICD-10-CM | POA: Diagnosis not present

## 2022-03-23 DIAGNOSIS — I10 Essential (primary) hypertension: Secondary | ICD-10-CM | POA: Diagnosis not present

## 2022-03-23 DIAGNOSIS — I4891 Unspecified atrial fibrillation: Secondary | ICD-10-CM | POA: Diagnosis not present

## 2022-03-23 DIAGNOSIS — E559 Vitamin D deficiency, unspecified: Secondary | ICD-10-CM | POA: Diagnosis not present

## 2022-03-23 DIAGNOSIS — N390 Urinary tract infection, site not specified: Secondary | ICD-10-CM | POA: Diagnosis not present

## 2022-03-23 DIAGNOSIS — D61818 Other pancytopenia: Secondary | ICD-10-CM | POA: Diagnosis not present

## 2022-03-23 DIAGNOSIS — R4182 Altered mental status, unspecified: Secondary | ICD-10-CM | POA: Diagnosis not present

## 2022-03-23 DIAGNOSIS — K219 Gastro-esophageal reflux disease without esophagitis: Secondary | ICD-10-CM | POA: Diagnosis not present

## 2022-03-23 DIAGNOSIS — E785 Hyperlipidemia, unspecified: Secondary | ICD-10-CM | POA: Diagnosis not present

## 2022-03-23 DIAGNOSIS — C712 Malignant neoplasm of temporal lobe: Secondary | ICD-10-CM | POA: Diagnosis not present

## 2022-03-24 DIAGNOSIS — K219 Gastro-esophageal reflux disease without esophagitis: Secondary | ICD-10-CM | POA: Diagnosis not present

## 2022-03-24 DIAGNOSIS — N390 Urinary tract infection, site not specified: Secondary | ICD-10-CM | POA: Diagnosis not present

## 2022-03-24 DIAGNOSIS — E559 Vitamin D deficiency, unspecified: Secondary | ICD-10-CM | POA: Diagnosis not present

## 2022-03-24 DIAGNOSIS — E785 Hyperlipidemia, unspecified: Secondary | ICD-10-CM | POA: Diagnosis not present

## 2022-03-24 DIAGNOSIS — R4182 Altered mental status, unspecified: Secondary | ICD-10-CM | POA: Diagnosis not present

## 2022-03-24 DIAGNOSIS — I4891 Unspecified atrial fibrillation: Secondary | ICD-10-CM | POA: Diagnosis not present

## 2022-03-24 DIAGNOSIS — C712 Malignant neoplasm of temporal lobe: Secondary | ICD-10-CM | POA: Diagnosis not present

## 2022-03-24 DIAGNOSIS — D61818 Other pancytopenia: Secondary | ICD-10-CM | POA: Diagnosis not present

## 2022-03-24 DIAGNOSIS — I10 Essential (primary) hypertension: Secondary | ICD-10-CM | POA: Diagnosis not present

## 2022-03-25 DIAGNOSIS — D61818 Other pancytopenia: Secondary | ICD-10-CM | POA: Diagnosis not present

## 2022-03-25 DIAGNOSIS — I10 Essential (primary) hypertension: Secondary | ICD-10-CM | POA: Diagnosis not present

## 2022-03-25 DIAGNOSIS — R4182 Altered mental status, unspecified: Secondary | ICD-10-CM | POA: Diagnosis not present

## 2022-03-25 DIAGNOSIS — I4891 Unspecified atrial fibrillation: Secondary | ICD-10-CM | POA: Diagnosis not present

## 2022-03-25 DIAGNOSIS — E785 Hyperlipidemia, unspecified: Secondary | ICD-10-CM | POA: Diagnosis not present

## 2022-03-25 DIAGNOSIS — N390 Urinary tract infection, site not specified: Secondary | ICD-10-CM | POA: Diagnosis not present

## 2022-03-25 DIAGNOSIS — E559 Vitamin D deficiency, unspecified: Secondary | ICD-10-CM | POA: Diagnosis not present

## 2022-03-25 DIAGNOSIS — C712 Malignant neoplasm of temporal lobe: Secondary | ICD-10-CM | POA: Diagnosis not present

## 2022-03-25 DIAGNOSIS — K219 Gastro-esophageal reflux disease without esophagitis: Secondary | ICD-10-CM | POA: Diagnosis not present

## 2022-03-26 DIAGNOSIS — D61818 Other pancytopenia: Secondary | ICD-10-CM | POA: Diagnosis not present

## 2022-03-26 DIAGNOSIS — I10 Essential (primary) hypertension: Secondary | ICD-10-CM | POA: Diagnosis not present

## 2022-03-26 DIAGNOSIS — R4182 Altered mental status, unspecified: Secondary | ICD-10-CM | POA: Diagnosis not present

## 2022-03-26 DIAGNOSIS — I4891 Unspecified atrial fibrillation: Secondary | ICD-10-CM | POA: Diagnosis not present

## 2022-03-26 DIAGNOSIS — E785 Hyperlipidemia, unspecified: Secondary | ICD-10-CM | POA: Diagnosis not present

## 2022-03-26 DIAGNOSIS — N390 Urinary tract infection, site not specified: Secondary | ICD-10-CM | POA: Diagnosis not present

## 2022-03-26 DIAGNOSIS — E559 Vitamin D deficiency, unspecified: Secondary | ICD-10-CM | POA: Diagnosis not present

## 2022-03-26 DIAGNOSIS — C712 Malignant neoplasm of temporal lobe: Secondary | ICD-10-CM | POA: Diagnosis not present

## 2022-03-26 DIAGNOSIS — K219 Gastro-esophageal reflux disease without esophagitis: Secondary | ICD-10-CM | POA: Diagnosis not present

## 2022-03-27 DIAGNOSIS — R4182 Altered mental status, unspecified: Secondary | ICD-10-CM | POA: Diagnosis not present

## 2022-03-27 DIAGNOSIS — C712 Malignant neoplasm of temporal lobe: Secondary | ICD-10-CM | POA: Diagnosis not present

## 2022-03-27 DIAGNOSIS — N390 Urinary tract infection, site not specified: Secondary | ICD-10-CM | POA: Diagnosis not present

## 2022-03-27 DIAGNOSIS — I10 Essential (primary) hypertension: Secondary | ICD-10-CM | POA: Diagnosis not present

## 2022-03-27 DIAGNOSIS — E559 Vitamin D deficiency, unspecified: Secondary | ICD-10-CM | POA: Diagnosis not present

## 2022-03-27 DIAGNOSIS — K219 Gastro-esophageal reflux disease without esophagitis: Secondary | ICD-10-CM | POA: Diagnosis not present

## 2022-03-27 DIAGNOSIS — I4891 Unspecified atrial fibrillation: Secondary | ICD-10-CM | POA: Diagnosis not present

## 2022-03-27 DIAGNOSIS — D61818 Other pancytopenia: Secondary | ICD-10-CM | POA: Diagnosis not present

## 2022-03-27 DIAGNOSIS — E785 Hyperlipidemia, unspecified: Secondary | ICD-10-CM | POA: Diagnosis not present

## 2022-03-28 DIAGNOSIS — I4891 Unspecified atrial fibrillation: Secondary | ICD-10-CM | POA: Diagnosis not present

## 2022-03-28 DIAGNOSIS — E559 Vitamin D deficiency, unspecified: Secondary | ICD-10-CM | POA: Diagnosis not present

## 2022-03-28 DIAGNOSIS — K219 Gastro-esophageal reflux disease without esophagitis: Secondary | ICD-10-CM | POA: Diagnosis not present

## 2022-03-28 DIAGNOSIS — I10 Essential (primary) hypertension: Secondary | ICD-10-CM | POA: Diagnosis not present

## 2022-03-28 DIAGNOSIS — E785 Hyperlipidemia, unspecified: Secondary | ICD-10-CM | POA: Diagnosis not present

## 2022-03-28 DIAGNOSIS — C712 Malignant neoplasm of temporal lobe: Secondary | ICD-10-CM | POA: Diagnosis not present

## 2022-03-28 DIAGNOSIS — N390 Urinary tract infection, site not specified: Secondary | ICD-10-CM | POA: Diagnosis not present

## 2022-03-28 DIAGNOSIS — R4182 Altered mental status, unspecified: Secondary | ICD-10-CM | POA: Diagnosis not present

## 2022-03-28 DIAGNOSIS — D61818 Other pancytopenia: Secondary | ICD-10-CM | POA: Diagnosis not present

## 2022-03-29 DIAGNOSIS — D61818 Other pancytopenia: Secondary | ICD-10-CM | POA: Diagnosis not present

## 2022-03-29 DIAGNOSIS — C712 Malignant neoplasm of temporal lobe: Secondary | ICD-10-CM | POA: Diagnosis not present

## 2022-03-29 DIAGNOSIS — K219 Gastro-esophageal reflux disease without esophagitis: Secondary | ICD-10-CM | POA: Diagnosis not present

## 2022-03-29 DIAGNOSIS — R4182 Altered mental status, unspecified: Secondary | ICD-10-CM | POA: Diagnosis not present

## 2022-03-29 DIAGNOSIS — I4891 Unspecified atrial fibrillation: Secondary | ICD-10-CM | POA: Diagnosis not present

## 2022-03-29 DIAGNOSIS — N390 Urinary tract infection, site not specified: Secondary | ICD-10-CM | POA: Diagnosis not present

## 2022-03-29 DIAGNOSIS — E785 Hyperlipidemia, unspecified: Secondary | ICD-10-CM | POA: Diagnosis not present

## 2022-03-29 DIAGNOSIS — E559 Vitamin D deficiency, unspecified: Secondary | ICD-10-CM | POA: Diagnosis not present

## 2022-03-29 DIAGNOSIS — I10 Essential (primary) hypertension: Secondary | ICD-10-CM | POA: Diagnosis not present

## 2022-03-30 DIAGNOSIS — D61818 Other pancytopenia: Secondary | ICD-10-CM | POA: Diagnosis not present

## 2022-03-30 DIAGNOSIS — E559 Vitamin D deficiency, unspecified: Secondary | ICD-10-CM | POA: Diagnosis not present

## 2022-03-30 DIAGNOSIS — C712 Malignant neoplasm of temporal lobe: Secondary | ICD-10-CM | POA: Diagnosis not present

## 2022-03-30 DIAGNOSIS — R4182 Altered mental status, unspecified: Secondary | ICD-10-CM | POA: Diagnosis not present

## 2022-03-30 DIAGNOSIS — I10 Essential (primary) hypertension: Secondary | ICD-10-CM | POA: Diagnosis not present

## 2022-03-30 DIAGNOSIS — E785 Hyperlipidemia, unspecified: Secondary | ICD-10-CM | POA: Diagnosis not present

## 2022-03-30 DIAGNOSIS — I4891 Unspecified atrial fibrillation: Secondary | ICD-10-CM | POA: Diagnosis not present

## 2022-03-30 DIAGNOSIS — N390 Urinary tract infection, site not specified: Secondary | ICD-10-CM | POA: Diagnosis not present

## 2022-03-30 DIAGNOSIS — K219 Gastro-esophageal reflux disease without esophagitis: Secondary | ICD-10-CM | POA: Diagnosis not present

## 2022-03-31 DIAGNOSIS — N390 Urinary tract infection, site not specified: Secondary | ICD-10-CM | POA: Diagnosis not present

## 2022-03-31 DIAGNOSIS — K219 Gastro-esophageal reflux disease without esophagitis: Secondary | ICD-10-CM | POA: Diagnosis not present

## 2022-03-31 DIAGNOSIS — I10 Essential (primary) hypertension: Secondary | ICD-10-CM | POA: Diagnosis not present

## 2022-03-31 DIAGNOSIS — I4891 Unspecified atrial fibrillation: Secondary | ICD-10-CM | POA: Diagnosis not present

## 2022-03-31 DIAGNOSIS — E559 Vitamin D deficiency, unspecified: Secondary | ICD-10-CM | POA: Diagnosis not present

## 2022-03-31 DIAGNOSIS — C712 Malignant neoplasm of temporal lobe: Secondary | ICD-10-CM | POA: Diagnosis not present

## 2022-03-31 DIAGNOSIS — D61818 Other pancytopenia: Secondary | ICD-10-CM | POA: Diagnosis not present

## 2022-03-31 DIAGNOSIS — E785 Hyperlipidemia, unspecified: Secondary | ICD-10-CM | POA: Diagnosis not present

## 2022-03-31 DIAGNOSIS — R4182 Altered mental status, unspecified: Secondary | ICD-10-CM | POA: Diagnosis not present

## 2022-04-01 DIAGNOSIS — K219 Gastro-esophageal reflux disease without esophagitis: Secondary | ICD-10-CM | POA: Diagnosis not present

## 2022-04-01 DIAGNOSIS — E785 Hyperlipidemia, unspecified: Secondary | ICD-10-CM | POA: Diagnosis not present

## 2022-04-01 DIAGNOSIS — C712 Malignant neoplasm of temporal lobe: Secondary | ICD-10-CM | POA: Diagnosis not present

## 2022-04-01 DIAGNOSIS — E559 Vitamin D deficiency, unspecified: Secondary | ICD-10-CM | POA: Diagnosis not present

## 2022-04-01 DIAGNOSIS — R4182 Altered mental status, unspecified: Secondary | ICD-10-CM | POA: Diagnosis not present

## 2022-04-01 DIAGNOSIS — I10 Essential (primary) hypertension: Secondary | ICD-10-CM | POA: Diagnosis not present

## 2022-04-01 DIAGNOSIS — N390 Urinary tract infection, site not specified: Secondary | ICD-10-CM | POA: Diagnosis not present

## 2022-04-01 DIAGNOSIS — D61818 Other pancytopenia: Secondary | ICD-10-CM | POA: Diagnosis not present

## 2022-04-01 DIAGNOSIS — I4891 Unspecified atrial fibrillation: Secondary | ICD-10-CM | POA: Diagnosis not present

## 2022-04-02 DIAGNOSIS — R4182 Altered mental status, unspecified: Secondary | ICD-10-CM | POA: Diagnosis not present

## 2022-04-02 DIAGNOSIS — I4891 Unspecified atrial fibrillation: Secondary | ICD-10-CM | POA: Diagnosis not present

## 2022-04-02 DIAGNOSIS — D61818 Other pancytopenia: Secondary | ICD-10-CM | POA: Diagnosis not present

## 2022-04-02 DIAGNOSIS — C712 Malignant neoplasm of temporal lobe: Secondary | ICD-10-CM | POA: Diagnosis not present

## 2022-04-02 DIAGNOSIS — E785 Hyperlipidemia, unspecified: Secondary | ICD-10-CM | POA: Diagnosis not present

## 2022-04-02 DIAGNOSIS — I10 Essential (primary) hypertension: Secondary | ICD-10-CM | POA: Diagnosis not present

## 2022-04-02 DIAGNOSIS — K219 Gastro-esophageal reflux disease without esophagitis: Secondary | ICD-10-CM | POA: Diagnosis not present

## 2022-04-02 DIAGNOSIS — N390 Urinary tract infection, site not specified: Secondary | ICD-10-CM | POA: Diagnosis not present

## 2022-04-02 DIAGNOSIS — E559 Vitamin D deficiency, unspecified: Secondary | ICD-10-CM | POA: Diagnosis not present

## 2022-04-03 DIAGNOSIS — I10 Essential (primary) hypertension: Secondary | ICD-10-CM | POA: Diagnosis not present

## 2022-04-03 DIAGNOSIS — D61818 Other pancytopenia: Secondary | ICD-10-CM | POA: Diagnosis not present

## 2022-04-03 DIAGNOSIS — K219 Gastro-esophageal reflux disease without esophagitis: Secondary | ICD-10-CM | POA: Diagnosis not present

## 2022-04-03 DIAGNOSIS — C712 Malignant neoplasm of temporal lobe: Secondary | ICD-10-CM | POA: Diagnosis not present

## 2022-04-03 DIAGNOSIS — E785 Hyperlipidemia, unspecified: Secondary | ICD-10-CM | POA: Diagnosis not present

## 2022-04-03 DIAGNOSIS — R4182 Altered mental status, unspecified: Secondary | ICD-10-CM | POA: Diagnosis not present

## 2022-04-03 DIAGNOSIS — E559 Vitamin D deficiency, unspecified: Secondary | ICD-10-CM | POA: Diagnosis not present

## 2022-04-03 DIAGNOSIS — N390 Urinary tract infection, site not specified: Secondary | ICD-10-CM | POA: Diagnosis not present

## 2022-04-03 DIAGNOSIS — I4891 Unspecified atrial fibrillation: Secondary | ICD-10-CM | POA: Diagnosis not present

## 2022-04-04 DIAGNOSIS — E785 Hyperlipidemia, unspecified: Secondary | ICD-10-CM | POA: Diagnosis not present

## 2022-04-04 DIAGNOSIS — I4891 Unspecified atrial fibrillation: Secondary | ICD-10-CM | POA: Diagnosis not present

## 2022-04-04 DIAGNOSIS — C712 Malignant neoplasm of temporal lobe: Secondary | ICD-10-CM | POA: Diagnosis not present

## 2022-04-04 DIAGNOSIS — K219 Gastro-esophageal reflux disease without esophagitis: Secondary | ICD-10-CM | POA: Diagnosis not present

## 2022-04-04 DIAGNOSIS — N390 Urinary tract infection, site not specified: Secondary | ICD-10-CM | POA: Diagnosis not present

## 2022-04-04 DIAGNOSIS — I10 Essential (primary) hypertension: Secondary | ICD-10-CM | POA: Diagnosis not present

## 2022-04-04 DIAGNOSIS — R4182 Altered mental status, unspecified: Secondary | ICD-10-CM | POA: Diagnosis not present

## 2022-04-04 DIAGNOSIS — E559 Vitamin D deficiency, unspecified: Secondary | ICD-10-CM | POA: Diagnosis not present

## 2022-04-04 DIAGNOSIS — D61818 Other pancytopenia: Secondary | ICD-10-CM | POA: Diagnosis not present

## 2022-04-05 DIAGNOSIS — I10 Essential (primary) hypertension: Secondary | ICD-10-CM | POA: Diagnosis not present

## 2022-04-05 DIAGNOSIS — D61818 Other pancytopenia: Secondary | ICD-10-CM | POA: Diagnosis not present

## 2022-04-05 DIAGNOSIS — R4182 Altered mental status, unspecified: Secondary | ICD-10-CM | POA: Diagnosis not present

## 2022-04-05 DIAGNOSIS — C712 Malignant neoplasm of temporal lobe: Secondary | ICD-10-CM | POA: Diagnosis not present

## 2022-04-05 DIAGNOSIS — N390 Urinary tract infection, site not specified: Secondary | ICD-10-CM | POA: Diagnosis not present

## 2022-04-05 DIAGNOSIS — E785 Hyperlipidemia, unspecified: Secondary | ICD-10-CM | POA: Diagnosis not present

## 2022-04-05 DIAGNOSIS — K219 Gastro-esophageal reflux disease without esophagitis: Secondary | ICD-10-CM | POA: Diagnosis not present

## 2022-04-05 DIAGNOSIS — E559 Vitamin D deficiency, unspecified: Secondary | ICD-10-CM | POA: Diagnosis not present

## 2022-04-05 DIAGNOSIS — I4891 Unspecified atrial fibrillation: Secondary | ICD-10-CM | POA: Diagnosis not present

## 2022-04-06 DIAGNOSIS — I10 Essential (primary) hypertension: Secondary | ICD-10-CM | POA: Diagnosis not present

## 2022-04-06 DIAGNOSIS — D61818 Other pancytopenia: Secondary | ICD-10-CM | POA: Diagnosis not present

## 2022-04-06 DIAGNOSIS — C712 Malignant neoplasm of temporal lobe: Secondary | ICD-10-CM | POA: Diagnosis not present

## 2022-04-06 DIAGNOSIS — N390 Urinary tract infection, site not specified: Secondary | ICD-10-CM | POA: Diagnosis not present

## 2022-04-06 DIAGNOSIS — R4182 Altered mental status, unspecified: Secondary | ICD-10-CM | POA: Diagnosis not present

## 2022-04-06 DIAGNOSIS — I4891 Unspecified atrial fibrillation: Secondary | ICD-10-CM | POA: Diagnosis not present

## 2022-04-06 DIAGNOSIS — E559 Vitamin D deficiency, unspecified: Secondary | ICD-10-CM | POA: Diagnosis not present

## 2022-04-06 DIAGNOSIS — E785 Hyperlipidemia, unspecified: Secondary | ICD-10-CM | POA: Diagnosis not present

## 2022-04-06 DIAGNOSIS — K219 Gastro-esophageal reflux disease without esophagitis: Secondary | ICD-10-CM | POA: Diagnosis not present

## 2022-04-07 DIAGNOSIS — N390 Urinary tract infection, site not specified: Secondary | ICD-10-CM | POA: Diagnosis not present

## 2022-04-07 DIAGNOSIS — C712 Malignant neoplasm of temporal lobe: Secondary | ICD-10-CM | POA: Diagnosis not present

## 2022-04-07 DIAGNOSIS — I10 Essential (primary) hypertension: Secondary | ICD-10-CM | POA: Diagnosis not present

## 2022-04-07 DIAGNOSIS — D61818 Other pancytopenia: Secondary | ICD-10-CM | POA: Diagnosis not present

## 2022-04-07 DIAGNOSIS — E559 Vitamin D deficiency, unspecified: Secondary | ICD-10-CM | POA: Diagnosis not present

## 2022-04-07 DIAGNOSIS — I4891 Unspecified atrial fibrillation: Secondary | ICD-10-CM | POA: Diagnosis not present

## 2022-04-07 DIAGNOSIS — K219 Gastro-esophageal reflux disease without esophagitis: Secondary | ICD-10-CM | POA: Diagnosis not present

## 2022-04-07 DIAGNOSIS — R4182 Altered mental status, unspecified: Secondary | ICD-10-CM | POA: Diagnosis not present

## 2022-04-07 DIAGNOSIS — E785 Hyperlipidemia, unspecified: Secondary | ICD-10-CM | POA: Diagnosis not present

## 2022-04-08 DIAGNOSIS — D61818 Other pancytopenia: Secondary | ICD-10-CM | POA: Diagnosis not present

## 2022-04-08 DIAGNOSIS — E559 Vitamin D deficiency, unspecified: Secondary | ICD-10-CM | POA: Diagnosis not present

## 2022-04-08 DIAGNOSIS — I10 Essential (primary) hypertension: Secondary | ICD-10-CM | POA: Diagnosis not present

## 2022-04-08 DIAGNOSIS — K219 Gastro-esophageal reflux disease without esophagitis: Secondary | ICD-10-CM | POA: Diagnosis not present

## 2022-04-08 DIAGNOSIS — I4891 Unspecified atrial fibrillation: Secondary | ICD-10-CM | POA: Diagnosis not present

## 2022-04-08 DIAGNOSIS — R4182 Altered mental status, unspecified: Secondary | ICD-10-CM | POA: Diagnosis not present

## 2022-04-08 DIAGNOSIS — C712 Malignant neoplasm of temporal lobe: Secondary | ICD-10-CM | POA: Diagnosis not present

## 2022-04-08 DIAGNOSIS — N390 Urinary tract infection, site not specified: Secondary | ICD-10-CM | POA: Diagnosis not present

## 2022-04-08 DIAGNOSIS — E785 Hyperlipidemia, unspecified: Secondary | ICD-10-CM | POA: Diagnosis not present

## 2022-04-09 DIAGNOSIS — C712 Malignant neoplasm of temporal lobe: Secondary | ICD-10-CM | POA: Diagnosis not present

## 2022-04-09 DIAGNOSIS — D61818 Other pancytopenia: Secondary | ICD-10-CM | POA: Diagnosis not present

## 2022-04-09 DIAGNOSIS — E559 Vitamin D deficiency, unspecified: Secondary | ICD-10-CM | POA: Diagnosis not present

## 2022-04-09 DIAGNOSIS — I4891 Unspecified atrial fibrillation: Secondary | ICD-10-CM | POA: Diagnosis not present

## 2022-04-09 DIAGNOSIS — I10 Essential (primary) hypertension: Secondary | ICD-10-CM | POA: Diagnosis not present

## 2022-04-09 DIAGNOSIS — R4182 Altered mental status, unspecified: Secondary | ICD-10-CM | POA: Diagnosis not present

## 2022-04-09 DIAGNOSIS — K219 Gastro-esophageal reflux disease without esophagitis: Secondary | ICD-10-CM | POA: Diagnosis not present

## 2022-04-09 DIAGNOSIS — N390 Urinary tract infection, site not specified: Secondary | ICD-10-CM | POA: Diagnosis not present

## 2022-04-09 DIAGNOSIS — E785 Hyperlipidemia, unspecified: Secondary | ICD-10-CM | POA: Diagnosis not present

## 2022-04-10 DIAGNOSIS — E559 Vitamin D deficiency, unspecified: Secondary | ICD-10-CM | POA: Diagnosis not present

## 2022-04-10 DIAGNOSIS — I10 Essential (primary) hypertension: Secondary | ICD-10-CM | POA: Diagnosis not present

## 2022-04-10 DIAGNOSIS — K219 Gastro-esophageal reflux disease without esophagitis: Secondary | ICD-10-CM | POA: Diagnosis not present

## 2022-04-10 DIAGNOSIS — C712 Malignant neoplasm of temporal lobe: Secondary | ICD-10-CM | POA: Diagnosis not present

## 2022-04-10 DIAGNOSIS — D61818 Other pancytopenia: Secondary | ICD-10-CM | POA: Diagnosis not present

## 2022-04-10 DIAGNOSIS — R4182 Altered mental status, unspecified: Secondary | ICD-10-CM | POA: Diagnosis not present

## 2022-04-10 DIAGNOSIS — N390 Urinary tract infection, site not specified: Secondary | ICD-10-CM | POA: Diagnosis not present

## 2022-04-10 DIAGNOSIS — I4891 Unspecified atrial fibrillation: Secondary | ICD-10-CM | POA: Diagnosis not present

## 2022-04-10 DIAGNOSIS — E785 Hyperlipidemia, unspecified: Secondary | ICD-10-CM | POA: Diagnosis not present

## 2022-04-11 DIAGNOSIS — I4891 Unspecified atrial fibrillation: Secondary | ICD-10-CM | POA: Diagnosis not present

## 2022-04-11 DIAGNOSIS — E785 Hyperlipidemia, unspecified: Secondary | ICD-10-CM | POA: Diagnosis not present

## 2022-04-11 DIAGNOSIS — K219 Gastro-esophageal reflux disease without esophagitis: Secondary | ICD-10-CM | POA: Diagnosis not present

## 2022-04-11 DIAGNOSIS — I10 Essential (primary) hypertension: Secondary | ICD-10-CM | POA: Diagnosis not present

## 2022-04-11 DIAGNOSIS — R4182 Altered mental status, unspecified: Secondary | ICD-10-CM | POA: Diagnosis not present

## 2022-04-11 DIAGNOSIS — E559 Vitamin D deficiency, unspecified: Secondary | ICD-10-CM | POA: Diagnosis not present

## 2022-04-11 DIAGNOSIS — D61818 Other pancytopenia: Secondary | ICD-10-CM | POA: Diagnosis not present

## 2022-04-11 DIAGNOSIS — C712 Malignant neoplasm of temporal lobe: Secondary | ICD-10-CM | POA: Diagnosis not present

## 2022-04-11 DIAGNOSIS — N390 Urinary tract infection, site not specified: Secondary | ICD-10-CM | POA: Diagnosis not present

## 2022-04-12 DIAGNOSIS — I4891 Unspecified atrial fibrillation: Secondary | ICD-10-CM | POA: Diagnosis not present

## 2022-04-12 DIAGNOSIS — I10 Essential (primary) hypertension: Secondary | ICD-10-CM | POA: Diagnosis not present

## 2022-04-12 DIAGNOSIS — D61818 Other pancytopenia: Secondary | ICD-10-CM | POA: Diagnosis not present

## 2022-04-12 DIAGNOSIS — R4182 Altered mental status, unspecified: Secondary | ICD-10-CM | POA: Diagnosis not present

## 2022-04-12 DIAGNOSIS — C712 Malignant neoplasm of temporal lobe: Secondary | ICD-10-CM | POA: Diagnosis not present

## 2022-04-12 DIAGNOSIS — N390 Urinary tract infection, site not specified: Secondary | ICD-10-CM | POA: Diagnosis not present

## 2022-04-12 DIAGNOSIS — E785 Hyperlipidemia, unspecified: Secondary | ICD-10-CM | POA: Diagnosis not present

## 2022-04-12 DIAGNOSIS — E559 Vitamin D deficiency, unspecified: Secondary | ICD-10-CM | POA: Diagnosis not present

## 2022-04-12 DIAGNOSIS — K219 Gastro-esophageal reflux disease without esophagitis: Secondary | ICD-10-CM | POA: Diagnosis not present

## 2022-04-13 DIAGNOSIS — I4891 Unspecified atrial fibrillation: Secondary | ICD-10-CM | POA: Diagnosis not present

## 2022-04-13 DIAGNOSIS — D61818 Other pancytopenia: Secondary | ICD-10-CM | POA: Diagnosis not present

## 2022-04-13 DIAGNOSIS — E559 Vitamin D deficiency, unspecified: Secondary | ICD-10-CM | POA: Diagnosis not present

## 2022-04-13 DIAGNOSIS — N390 Urinary tract infection, site not specified: Secondary | ICD-10-CM | POA: Diagnosis not present

## 2022-04-13 DIAGNOSIS — I10 Essential (primary) hypertension: Secondary | ICD-10-CM | POA: Diagnosis not present

## 2022-04-13 DIAGNOSIS — E785 Hyperlipidemia, unspecified: Secondary | ICD-10-CM | POA: Diagnosis not present

## 2022-04-13 DIAGNOSIS — C712 Malignant neoplasm of temporal lobe: Secondary | ICD-10-CM | POA: Diagnosis not present

## 2022-04-13 DIAGNOSIS — R4182 Altered mental status, unspecified: Secondary | ICD-10-CM | POA: Diagnosis not present

## 2022-04-13 DIAGNOSIS — K219 Gastro-esophageal reflux disease without esophagitis: Secondary | ICD-10-CM | POA: Diagnosis not present

## 2022-04-14 ENCOUNTER — Telehealth: Payer: Self-pay

## 2022-04-14 DIAGNOSIS — K219 Gastro-esophageal reflux disease without esophagitis: Secondary | ICD-10-CM | POA: Diagnosis not present

## 2022-04-14 DIAGNOSIS — E559 Vitamin D deficiency, unspecified: Secondary | ICD-10-CM | POA: Diagnosis not present

## 2022-04-14 DIAGNOSIS — R4182 Altered mental status, unspecified: Secondary | ICD-10-CM | POA: Diagnosis not present

## 2022-04-14 DIAGNOSIS — N390 Urinary tract infection, site not specified: Secondary | ICD-10-CM | POA: Diagnosis not present

## 2022-04-14 DIAGNOSIS — C712 Malignant neoplasm of temporal lobe: Secondary | ICD-10-CM | POA: Diagnosis not present

## 2022-04-14 DIAGNOSIS — I4891 Unspecified atrial fibrillation: Secondary | ICD-10-CM | POA: Diagnosis not present

## 2022-04-14 DIAGNOSIS — E785 Hyperlipidemia, unspecified: Secondary | ICD-10-CM | POA: Diagnosis not present

## 2022-04-14 DIAGNOSIS — I10 Essential (primary) hypertension: Secondary | ICD-10-CM | POA: Diagnosis not present

## 2022-04-14 DIAGNOSIS — D61818 Other pancytopenia: Secondary | ICD-10-CM | POA: Diagnosis not present

## 2022-04-23 NOTE — Telephone Encounter (Signed)
Received call from [employer] stating that pt died at 10:56 AM today.

## 2022-04-23 NOTE — Telephone Encounter (Signed)
Opened in error

## 2022-04-23 DEATH — deceased

## 2024-04-04 ENCOUNTER — Other Ambulatory Visit (HOSPITAL_BASED_OUTPATIENT_CLINIC_OR_DEPARTMENT_OTHER): Payer: Self-pay
